# Patient Record
Sex: Female | Born: 1935 | Race: White | Hispanic: No | Smoking: Former smoker
Health system: Southern US, Community
[De-identification: ages and names within clinical notes are randomized; demographics above are authoritative.]

## PROBLEM LIST (undated history)

## (undated) DIAGNOSIS — I10 Essential (primary) hypertension: Secondary | ICD-10-CM

## (undated) DIAGNOSIS — I350 Nonrheumatic aortic (valve) stenosis: Secondary | ICD-10-CM

## (undated) DIAGNOSIS — K219 Gastro-esophageal reflux disease without esophagitis: Secondary | ICD-10-CM

## (undated) DIAGNOSIS — I4821 Permanent atrial fibrillation: Secondary | ICD-10-CM

## (undated) DIAGNOSIS — Z85828 Personal history of other malignant neoplasm of skin: Secondary | ICD-10-CM

## (undated) DIAGNOSIS — K279 Peptic ulcer, site unspecified, unspecified as acute or chronic, without hemorrhage or perforation: Secondary | ICD-10-CM

## (undated) DIAGNOSIS — Z803 Family history of malignant neoplasm of breast: Secondary | ICD-10-CM

## (undated) DIAGNOSIS — E785 Hyperlipidemia, unspecified: Secondary | ICD-10-CM

## (undated) DIAGNOSIS — I340 Nonrheumatic mitral (valve) insufficiency: Secondary | ICD-10-CM

## (undated) DIAGNOSIS — K760 Fatty (change of) liver, not elsewhere classified: Secondary | ICD-10-CM

## (undated) DIAGNOSIS — Z9889 Other specified postprocedural states: Secondary | ICD-10-CM

## (undated) DIAGNOSIS — E042 Nontoxic multinodular goiter: Secondary | ICD-10-CM

## (undated) DIAGNOSIS — D72820 Lymphocytosis (symptomatic): Secondary | ICD-10-CM

## (undated) DIAGNOSIS — M1990 Unspecified osteoarthritis, unspecified site: Secondary | ICD-10-CM

## (undated) DIAGNOSIS — R112 Nausea with vomiting, unspecified: Secondary | ICD-10-CM

## (undated) DIAGNOSIS — N390 Urinary tract infection, site not specified: Secondary | ICD-10-CM

## (undated) DIAGNOSIS — I272 Pulmonary hypertension, unspecified: Secondary | ICD-10-CM

## (undated) DIAGNOSIS — Z8042 Family history of malignant neoplasm of prostate: Secondary | ICD-10-CM

## (undated) DIAGNOSIS — I351 Nonrheumatic aortic (valve) insufficiency: Secondary | ICD-10-CM

## (undated) DIAGNOSIS — R918 Other nonspecific abnormal finding of lung field: Secondary | ICD-10-CM

## (undated) DIAGNOSIS — I499 Cardiac arrhythmia, unspecified: Secondary | ICD-10-CM

## (undated) DIAGNOSIS — I050 Rheumatic mitral stenosis: Secondary | ICD-10-CM

## (undated) DIAGNOSIS — R600 Localized edema: Secondary | ICD-10-CM

## (undated) HISTORY — DX: Localized edema: R60.0

## (undated) HISTORY — DX: Hyperlipidemia, unspecified: E78.5

## (undated) HISTORY — DX: Permanent atrial fibrillation: I48.21

## (undated) HISTORY — DX: Gastro-esophageal reflux disease without esophagitis: K21.9

## (undated) HISTORY — DX: Cardiac arrhythmia, unspecified: I49.9

## (undated) HISTORY — DX: Unspecified osteoarthritis, unspecified site: M19.90

## (undated) HISTORY — DX: Pulmonary hypertension, unspecified: I27.20

## (undated) HISTORY — DX: Peptic ulcer, site unspecified, unspecified as acute or chronic, without hemorrhage or perforation: K27.9

## (undated) HISTORY — DX: Nonrheumatic mitral (valve) insufficiency: I34.0

## (undated) HISTORY — DX: Other nonspecific abnormal finding of lung field: R91.8

## (undated) HISTORY — DX: Fatty (change of) liver, not elsewhere classified: K76.0

## (undated) HISTORY — DX: Family history of malignant neoplasm of prostate: Z80.42

## (undated) HISTORY — DX: Lymphocytosis (symptomatic): D72.820

## (undated) HISTORY — DX: Rheumatic mitral stenosis: I05.0

## (undated) HISTORY — DX: Personal history of other malignant neoplasm of skin: Z85.828

## (undated) HISTORY — DX: Nonrheumatic aortic (valve) stenosis: I35.0

## (undated) HISTORY — DX: Essential (primary) hypertension: I10

## (undated) HISTORY — DX: Nontoxic multinodular goiter: E04.2

## (undated) HISTORY — DX: Nonrheumatic aortic (valve) insufficiency: I35.1

## (undated) HISTORY — DX: Family history of malignant neoplasm of breast: Z80.3

## (undated) HISTORY — PX: CYSTOSCOPY: SUR368

## (undated) HISTORY — PX: OTHER SURGICAL HISTORY: SHX169

---

## 1960-08-04 HISTORY — PX: BREAST SURGERY: SHX581

## 1969-08-04 HISTORY — PX: BREAST SURGERY: SHX581

## 1976-08-04 HISTORY — PX: OTHER SURGICAL HISTORY: SHX169

## 1977-08-04 HISTORY — PX: DILATION AND CURETTAGE OF UTERUS: SHX78

## 2000-08-04 HISTORY — PX: JOINT REPLACEMENT: SHX530

## 2001-08-04 HISTORY — PX: CHOLECYSTECTOMY: SHX55

## 2004-03-04 DIAGNOSIS — I83893 Varicose veins of bilateral lower extremities with other complications: Secondary | ICD-10-CM | POA: Insufficient documentation

## 2005-08-04 HISTORY — PX: JOINT REPLACEMENT: SHX530

## 2008-05-24 ENCOUNTER — Encounter: Admission: RE | Admit: 2008-05-24 | Discharge: 2008-05-24 | Payer: Self-pay | Admitting: Family Medicine

## 2008-10-06 ENCOUNTER — Ambulatory Visit (HOSPITAL_COMMUNITY): Admission: RE | Admit: 2008-10-06 | Discharge: 2008-10-06 | Payer: Self-pay | Admitting: Cardiology

## 2008-10-11 ENCOUNTER — Encounter: Admission: RE | Admit: 2008-10-11 | Discharge: 2008-10-11 | Payer: Self-pay | Admitting: Cardiology

## 2008-10-16 ENCOUNTER — Ambulatory Visit: Payer: Self-pay | Admitting: Internal Medicine

## 2008-10-18 ENCOUNTER — Encounter: Admission: RE | Admit: 2008-10-18 | Discharge: 2008-10-18 | Payer: Self-pay | Admitting: Family Medicine

## 2008-10-23 ENCOUNTER — Inpatient Hospital Stay (HOSPITAL_COMMUNITY): Admission: AD | Admit: 2008-10-23 | Discharge: 2008-10-25 | Payer: Self-pay | Admitting: Cardiology

## 2008-11-07 ENCOUNTER — Other Ambulatory Visit: Admission: RE | Admit: 2008-11-07 | Discharge: 2008-11-07 | Payer: Self-pay | Admitting: Internal Medicine

## 2008-11-07 ENCOUNTER — Encounter: Payer: Self-pay | Admitting: Internal Medicine

## 2008-11-07 LAB — CBC WITH DIFFERENTIAL/PLATELET
BASO%: 0.6 % (ref 0.0–2.0)
Basophils Absolute: 0.1 10*3/uL (ref 0.0–0.1)
EOS%: 3.7 % (ref 0.0–7.0)
Eosinophils Absolute: 0.4 10*3/uL (ref 0.0–0.5)
HCT: 38.5 % (ref 34.8–46.6)
HGB: 13.5 g/dL (ref 11.6–15.9)
LYMPH%: 35.7 % (ref 14.0–49.7)
MCH: 31.6 pg (ref 25.1–34.0)
MCHC: 35.2 g/dL (ref 31.5–36.0)
MCV: 89.8 fL (ref 79.5–101.0)
MONO#: 0.7 10*3/uL (ref 0.1–0.9)
MONO%: 6.4 % (ref 0.0–14.0)
NEUT#: 6.1 10*3/uL (ref 1.5–6.5)
NEUT%: 53.6 % (ref 38.4–76.8)
Platelets: 236 10*3/uL (ref 145–400)
RBC: 4.29 10*6/uL (ref 3.70–5.45)
RDW: 13.2 % (ref 11.2–14.5)
WBC: 11.4 10*3/uL — ABNORMAL HIGH (ref 3.9–10.3)
lymph#: 4.1 10*3/uL — ABNORMAL HIGH (ref 0.9–3.3)

## 2008-11-07 LAB — LACTATE DEHYDROGENASE: LDH: 180 U/L (ref 94–250)

## 2008-11-07 LAB — COMPREHENSIVE METABOLIC PANEL
ALT: 30 U/L (ref 0–35)
AST: 25 U/L (ref 0–37)
Albumin: 4.4 g/dL (ref 3.5–5.2)
Alkaline Phosphatase: 34 U/L — ABNORMAL LOW (ref 39–117)
BUN: 25 mg/dL — ABNORMAL HIGH (ref 6–23)
CO2: 27 mEq/L (ref 19–32)
Calcium: 10 mg/dL (ref 8.4–10.5)
Chloride: 103 mEq/L (ref 96–112)
Creatinine, Ser: 1.21 mg/dL — ABNORMAL HIGH (ref 0.40–1.20)
Glucose, Bld: 99 mg/dL (ref 70–99)
Potassium: 4 mEq/L (ref 3.5–5.3)
Sodium: 141 mEq/L (ref 135–145)
Total Bilirubin: 0.7 mg/dL (ref 0.3–1.2)
Total Protein: 7.1 g/dL (ref 6.0–8.3)

## 2008-11-15 LAB — FLOW CYTOMETRY

## 2008-12-05 ENCOUNTER — Ambulatory Visit (HOSPITAL_COMMUNITY): Admission: RE | Admit: 2008-12-05 | Discharge: 2008-12-05 | Payer: Self-pay | Admitting: Family Medicine

## 2008-12-05 ENCOUNTER — Encounter (INDEPENDENT_AMBULATORY_CARE_PROVIDER_SITE_OTHER): Payer: Self-pay | Admitting: Interventional Radiology

## 2009-01-03 ENCOUNTER — Ambulatory Visit (HOSPITAL_COMMUNITY): Admission: RE | Admit: 2009-01-03 | Discharge: 2009-01-03 | Payer: Self-pay | Admitting: Family Medicine

## 2009-01-29 ENCOUNTER — Ambulatory Visit (HOSPITAL_COMMUNITY): Admission: RE | Admit: 2009-01-29 | Discharge: 2009-01-29 | Payer: Self-pay | Admitting: Cardiology

## 2009-02-20 ENCOUNTER — Ambulatory Visit: Payer: Self-pay | Admitting: Internal Medicine

## 2009-02-22 LAB — CBC WITH DIFFERENTIAL/PLATELET
BASO%: 0.7 % (ref 0.0–2.0)
Basophils Absolute: 0.1 10*3/uL (ref 0.0–0.1)
EOS%: 5.9 % (ref 0.0–7.0)
Eosinophils Absolute: 0.5 10*3/uL (ref 0.0–0.5)
HCT: 38.5 % (ref 34.8–46.6)
HGB: 13.5 g/dL (ref 11.6–15.9)
LYMPH%: 32.1 % (ref 14.0–49.7)
MCH: 31.9 pg (ref 25.1–34.0)
MCHC: 35 g/dL (ref 31.5–36.0)
MCV: 91.2 fL (ref 79.5–101.0)
MONO#: 0.6 10*3/uL (ref 0.1–0.9)
MONO%: 7.2 % (ref 0.0–14.0)
NEUT#: 4.9 10*3/uL (ref 1.5–6.5)
NEUT%: 54.1 % (ref 38.4–76.8)
Platelets: 244 10*3/uL (ref 145–400)
RBC: 4.22 10*6/uL (ref 3.70–5.45)
RDW: 12.9 % (ref 11.2–14.5)
WBC: 9.1 10*3/uL (ref 3.9–10.3)
lymph#: 2.9 10*3/uL (ref 0.9–3.3)

## 2009-02-22 LAB — LACTATE DEHYDROGENASE: LDH: 164 U/L (ref 94–250)

## 2009-03-01 ENCOUNTER — Emergency Department (HOSPITAL_COMMUNITY): Admission: EM | Admit: 2009-03-01 | Discharge: 2009-03-01 | Payer: Self-pay | Admitting: Emergency Medicine

## 2009-04-19 ENCOUNTER — Encounter: Admission: RE | Admit: 2009-04-19 | Discharge: 2009-04-19 | Payer: Self-pay | Admitting: Family Medicine

## 2009-05-08 ENCOUNTER — Encounter: Admission: RE | Admit: 2009-05-08 | Discharge: 2009-05-08 | Payer: Self-pay | Admitting: Surgery

## 2009-09-12 ENCOUNTER — Ambulatory Visit (HOSPITAL_COMMUNITY)
Admission: RE | Admit: 2009-09-12 | Discharge: 2009-09-12 | Payer: Self-pay | Source: Home / Self Care | Admitting: Cardiology

## 2009-09-23 ENCOUNTER — Emergency Department (HOSPITAL_COMMUNITY): Admission: EM | Admit: 2009-09-23 | Discharge: 2009-09-23 | Payer: Self-pay | Admitting: Emergency Medicine

## 2009-10-10 ENCOUNTER — Emergency Department (HOSPITAL_COMMUNITY): Admission: EM | Admit: 2009-10-10 | Discharge: 2009-10-10 | Payer: Self-pay | Admitting: Family Medicine

## 2009-10-10 ENCOUNTER — Emergency Department (HOSPITAL_COMMUNITY): Admission: EM | Admit: 2009-10-10 | Discharge: 2009-10-10 | Payer: Self-pay | Admitting: Emergency Medicine

## 2009-10-18 ENCOUNTER — Encounter: Admission: RE | Admit: 2009-10-18 | Discharge: 2009-10-18 | Payer: Self-pay | Admitting: Family Medicine

## 2010-02-28 ENCOUNTER — Ambulatory Visit (HOSPITAL_COMMUNITY): Admission: RE | Admit: 2010-02-28 | Discharge: 2010-02-28 | Payer: Self-pay | Admitting: Family Medicine

## 2010-04-30 ENCOUNTER — Encounter: Admission: RE | Admit: 2010-04-30 | Discharge: 2010-04-30 | Payer: Self-pay | Admitting: Family Medicine

## 2010-06-13 ENCOUNTER — Encounter: Admission: RE | Admit: 2010-06-13 | Discharge: 2010-06-13 | Payer: Self-pay | Admitting: Neurology

## 2010-06-24 ENCOUNTER — Inpatient Hospital Stay (HOSPITAL_COMMUNITY): Admission: EM | Admit: 2010-06-24 | Discharge: 2010-06-26 | Payer: Self-pay | Admitting: Emergency Medicine

## 2010-06-25 ENCOUNTER — Encounter (INDEPENDENT_AMBULATORY_CARE_PROVIDER_SITE_OTHER): Payer: Self-pay | Admitting: Internal Medicine

## 2010-08-12 ENCOUNTER — Encounter
Admission: RE | Admit: 2010-08-12 | Discharge: 2010-08-12 | Payer: Self-pay | Source: Home / Self Care | Attending: Surgery | Admitting: Surgery

## 2010-10-15 LAB — CBC
HCT: 33.7 % — ABNORMAL LOW (ref 36.0–46.0)
HCT: 34.2 % — ABNORMAL LOW (ref 36.0–46.0)
HCT: 34.4 % — ABNORMAL LOW (ref 36.0–46.0)
HCT: 36.3 % (ref 36.0–46.0)
Hemoglobin: 11.8 g/dL — ABNORMAL LOW (ref 12.0–15.0)
Hemoglobin: 11.9 g/dL — ABNORMAL LOW (ref 12.0–15.0)
Hemoglobin: 12 g/dL (ref 12.0–15.0)
Hemoglobin: 12.7 g/dL (ref 12.0–15.0)
MCH: 32.7 pg (ref 26.0–34.0)
MCH: 32.8 pg (ref 26.0–34.0)
MCH: 32.8 pg (ref 26.0–34.0)
MCH: 33.1 pg (ref 26.0–34.0)
MCHC: 34.6 g/dL (ref 30.0–36.0)
MCHC: 34.9 g/dL (ref 30.0–36.0)
MCHC: 35 g/dL (ref 30.0–36.0)
MCHC: 35.2 g/dL (ref 30.0–36.0)
MCV: 93.6 fL (ref 78.0–100.0)
MCV: 93.8 fL (ref 78.0–100.0)
MCV: 94.1 fL (ref 78.0–100.0)
MCV: 94.9 fL (ref 78.0–100.0)
Platelets: 191 10*3/uL (ref 150–400)
Platelets: 197 10*3/uL (ref 150–400)
Platelets: 201 10*3/uL (ref 150–400)
Platelets: 222 10*3/uL (ref 150–400)
RBC: 3.6 MIL/uL — ABNORMAL LOW (ref 3.87–5.11)
RBC: 3.63 MIL/uL — ABNORMAL LOW (ref 3.87–5.11)
RBC: 3.64 MIL/uL — ABNORMAL LOW (ref 3.87–5.11)
RBC: 3.88 MIL/uL (ref 3.87–5.11)
RDW: 12.9 % (ref 11.5–15.5)
RDW: 13 % (ref 11.5–15.5)
RDW: 13.1 % (ref 11.5–15.5)
RDW: 13.4 % (ref 11.5–15.5)
WBC: 10.4 10*3/uL (ref 4.0–10.5)
WBC: 11.1 10*3/uL — ABNORMAL HIGH (ref 4.0–10.5)
WBC: 11.5 10*3/uL — ABNORMAL HIGH (ref 4.0–10.5)
WBC: 12.2 10*3/uL — ABNORMAL HIGH (ref 4.0–10.5)

## 2010-10-15 LAB — COMPREHENSIVE METABOLIC PANEL
ALT: 52 U/L — ABNORMAL HIGH (ref 0–35)
ALT: 54 U/L — ABNORMAL HIGH (ref 0–35)
ALT: 58 U/L — ABNORMAL HIGH (ref 0–35)
AST: 44 U/L — ABNORMAL HIGH (ref 0–37)
AST: 46 U/L — ABNORMAL HIGH (ref 0–37)
AST: 50 U/L — ABNORMAL HIGH (ref 0–37)
Albumin: 3 g/dL — ABNORMAL LOW (ref 3.5–5.2)
Albumin: 3.2 g/dL — ABNORMAL LOW (ref 3.5–5.2)
Albumin: 3.2 g/dL — ABNORMAL LOW (ref 3.5–5.2)
Alkaline Phosphatase: 30 U/L — ABNORMAL LOW (ref 39–117)
Alkaline Phosphatase: 31 U/L — ABNORMAL LOW (ref 39–117)
Alkaline Phosphatase: 32 U/L — ABNORMAL LOW (ref 39–117)
BUN: 20 mg/dL (ref 6–23)
BUN: 24 mg/dL — ABNORMAL HIGH (ref 6–23)
BUN: 24 mg/dL — ABNORMAL HIGH (ref 6–23)
CO2: 29 mEq/L (ref 19–32)
CO2: 30 mEq/L (ref 19–32)
CO2: 32 mEq/L (ref 19–32)
Calcium: 9.5 mg/dL (ref 8.4–10.5)
Calcium: 9.6 mg/dL (ref 8.4–10.5)
Calcium: 9.8 mg/dL (ref 8.4–10.5)
Chloride: 103 mEq/L (ref 96–112)
Chloride: 104 mEq/L (ref 96–112)
Chloride: 106 mEq/L (ref 96–112)
Creatinine, Ser: 1.15 mg/dL (ref 0.4–1.2)
Creatinine, Ser: 1.22 mg/dL — ABNORMAL HIGH (ref 0.4–1.2)
Creatinine, Ser: 1.23 mg/dL — ABNORMAL HIGH (ref 0.4–1.2)
GFR calc Af Amer: 52 mL/min — ABNORMAL LOW (ref >60)
GFR calc Af Amer: 52 mL/min — ABNORMAL LOW (ref >60)
GFR calc Af Amer: 56 mL/min — ABNORMAL LOW (ref >60)
GFR calc non Af Amer: 43 mL/min — ABNORMAL LOW (ref >60)
GFR calc non Af Amer: 43 mL/min — ABNORMAL LOW (ref >60)
GFR calc non Af Amer: 46 mL/min — ABNORMAL LOW (ref >60)
Glucose, Bld: 110 mg/dL — ABNORMAL HIGH (ref 70–99)
Glucose, Bld: 116 mg/dL — ABNORMAL HIGH (ref 70–99)
Glucose, Bld: 122 mg/dL — ABNORMAL HIGH (ref 70–99)
Potassium: 3.5 mEq/L (ref 3.5–5.1)
Potassium: 3.7 mEq/L (ref 3.5–5.1)
Potassium: 4.4 mEq/L (ref 3.5–5.1)
Sodium: 139 mEq/L (ref 135–145)
Sodium: 140 mEq/L (ref 135–145)
Sodium: 144 mEq/L (ref 135–145)
Total Bilirubin: 0.6 mg/dL (ref 0.3–1.2)
Total Bilirubin: 0.8 mg/dL (ref 0.3–1.2)
Total Bilirubin: 0.8 mg/dL (ref 0.3–1.2)
Total Protein: 5.5 g/dL — ABNORMAL LOW (ref 6.0–8.3)
Total Protein: 5.7 g/dL — ABNORMAL LOW (ref 6.0–8.3)
Total Protein: 5.8 g/dL — ABNORMAL LOW (ref 6.0–8.3)

## 2010-10-15 LAB — BASIC METABOLIC PANEL
BUN: 20 mg/dL (ref 6–23)
BUN: 27 mg/dL — ABNORMAL HIGH (ref 6–23)
CO2: 30 mEq/L (ref 19–32)
CO2: 31 mEq/L (ref 19–32)
Calcium: 10 mg/dL (ref 8.4–10.5)
Calcium: 9.6 mg/dL (ref 8.4–10.5)
Chloride: 105 mEq/L (ref 96–112)
Chloride: 106 mEq/L (ref 96–112)
Creatinine, Ser: 1.15 mg/dL (ref 0.4–1.2)
Creatinine, Ser: 1.38 mg/dL — ABNORMAL HIGH (ref 0.4–1.2)
GFR calc Af Amer: 45 mL/min — ABNORMAL LOW (ref >60)
GFR calc Af Amer: 56 mL/min — ABNORMAL LOW (ref >60)
GFR calc non Af Amer: 37 mL/min — ABNORMAL LOW (ref >60)
GFR calc non Af Amer: 46 mL/min — ABNORMAL LOW (ref >60)
Glucose, Bld: 112 mg/dL — ABNORMAL HIGH (ref 70–99)
Glucose, Bld: 117 mg/dL — ABNORMAL HIGH (ref 70–99)
Potassium: 3.5 mEq/L (ref 3.5–5.1)
Potassium: 4.6 mEq/L (ref 3.5–5.1)
Sodium: 142 mEq/L (ref 135–145)
Sodium: 143 mEq/L (ref 135–145)

## 2010-10-15 LAB — URINALYSIS, ROUTINE W REFLEX MICROSCOPIC
Bilirubin Urine: NEGATIVE
Glucose, UA: NEGATIVE mg/dL
Hgb urine dipstick: NEGATIVE
Ketones, ur: NEGATIVE mg/dL
Nitrite: NEGATIVE
Protein, ur: NEGATIVE mg/dL
Specific Gravity, Urine: 1.019 (ref 1.005–1.030)
Urobilinogen, UA: 0.2 mg/dL (ref 0.0–1.0)
pH: 7 (ref 5.0–8.0)

## 2010-10-15 LAB — CARDIAC PANEL(CRET KIN+CKTOT+MB+TROPI)
CK, MB: 4.9 ng/mL — ABNORMAL HIGH (ref 0.3–4.0)
Relative Index: 1.5 (ref 0.0–2.5)
Total CK: 318 U/L — ABNORMAL HIGH (ref 7–177)
Troponin I: 0.02 ng/mL (ref 0.00–0.06)

## 2010-10-15 LAB — HEPATITIS PANEL, ACUTE
HCV Ab: NEGATIVE
Hep A IgM: NEGATIVE
Hep B C IgM: NEGATIVE
Hepatitis B Surface Ag: NEGATIVE

## 2010-10-15 LAB — PROTIME-INR
INR: 1.53 — ABNORMAL HIGH (ref 0.00–1.49)
INR: 1.73 — ABNORMAL HIGH (ref 0.00–1.49)
INR: 1.75 — ABNORMAL HIGH (ref 0.00–1.49)
INR: 1.86 — ABNORMAL HIGH (ref 0.00–1.49)
Prothrombin Time: 18.6 seconds — ABNORMAL HIGH (ref 11.6–15.2)
Prothrombin Time: 20.4 seconds — ABNORMAL HIGH (ref 11.6–15.2)
Prothrombin Time: 20.6 seconds — ABNORMAL HIGH (ref 11.6–15.2)
Prothrombin Time: 21.6 seconds — ABNORMAL HIGH (ref 11.6–15.2)

## 2010-10-15 LAB — APTT: aPTT: 30 seconds (ref 24–37)

## 2010-10-15 LAB — TSH: TSH: 1.211 u[IU]/mL (ref 0.350–4.500)

## 2010-10-23 LAB — POCT I-STAT, CHEM 8
BUN: 21 mg/dL (ref 6–23)
Calcium, Ion: 1.24 mmol/L (ref 1.12–1.32)
Chloride: 105 mEq/L (ref 96–112)
Creatinine, Ser: 1 mg/dL (ref 0.4–1.2)
Glucose, Bld: 118 mg/dL — ABNORMAL HIGH (ref 70–99)
HCT: 37 % (ref 36.0–46.0)
Hemoglobin: 12.6 g/dL (ref 12.0–15.0)
Potassium: 3.7 mEq/L (ref 3.5–5.1)
Sodium: 141 mEq/L (ref 135–145)
TCO2: 32 mmol/L (ref 0–100)

## 2010-10-23 LAB — PROTIME-INR
INR: 3.38 — ABNORMAL HIGH (ref 0.00–1.49)
Prothrombin Time: 33.9 seconds — ABNORMAL HIGH (ref 11.6–15.2)

## 2010-10-28 LAB — PROTIME-INR
INR: 2.23 — ABNORMAL HIGH (ref 0.00–1.49)
Prothrombin Time: 24.5 seconds — ABNORMAL HIGH (ref 11.6–15.2)

## 2010-11-11 LAB — PROTIME-INR
INR: 2.6 — ABNORMAL HIGH (ref 0.00–1.49)
Prothrombin Time: 29.5 seconds — ABNORMAL HIGH (ref 11.6–15.2)

## 2010-11-11 LAB — APTT: aPTT: 31 seconds (ref 24–37)

## 2010-11-11 LAB — MAGNESIUM: Magnesium: 2.1 mg/dL (ref 1.5–2.5)

## 2010-11-12 LAB — PROTIME-INR
INR: 1.1 (ref 0.00–1.49)
Prothrombin Time: 14.8 seconds (ref 11.6–15.2)

## 2010-11-12 LAB — APTT: aPTT: 24 seconds (ref 24–37)

## 2010-11-14 LAB — COMPREHENSIVE METABOLIC PANEL
ALT: 28 U/L (ref 0–35)
AST: 29 U/L (ref 0–37)
Albumin: 3.9 g/dL (ref 3.5–5.2)
Alkaline Phosphatase: 36 U/L — ABNORMAL LOW (ref 39–117)
BUN: 31 mg/dL — ABNORMAL HIGH (ref 6–23)
CO2: 29 mEq/L (ref 19–32)
Calcium: 10 mg/dL (ref 8.4–10.5)
Chloride: 102 mEq/L (ref 96–112)
Creatinine, Ser: 1.18 mg/dL (ref 0.4–1.2)
GFR calc Af Amer: 54 mL/min — ABNORMAL LOW (ref >60)
GFR calc non Af Amer: 45 mL/min — ABNORMAL LOW (ref >60)
Glucose, Bld: 109 mg/dL — ABNORMAL HIGH (ref 70–99)
Potassium: 4.1 mEq/L (ref 3.5–5.1)
Sodium: 140 mEq/L (ref 135–145)
Total Bilirubin: 0.7 mg/dL (ref 0.3–1.2)
Total Protein: 6.7 g/dL (ref 6.0–8.3)

## 2010-11-14 LAB — CBC
HCT: 42 % (ref 36.0–46.0)
HCT: 41.2 % (ref 36.0–46.0)
Hemoglobin: 14.8 g/dL (ref 12.0–15.0)
Hemoglobin: 14.5 g/dL (ref 12.0–15.0)
MCHC: 35.2 g/dL (ref 30.0–36.0)
MCHC: 35.1 g/dL (ref 30.0–36.0)
MCV: 90.8 fL (ref 78.0–100.0)
MCV: 91.1 fL (ref 78.0–100.0)
Platelets: 308 10*3/uL (ref 150–400)
Platelets: 256 10*3/uL (ref 150–400)
RBC: 4.62 MIL/uL (ref 3.87–5.11)
RBC: 4.53 MIL/uL (ref 3.87–5.11)
RDW: 12.8 % (ref 11.5–15.5)
RDW: 12.7 % (ref 11.5–15.5)
WBC: 13 10*3/uL — ABNORMAL HIGH (ref 4.0–10.5)
WBC: 12.6 10*3/uL — ABNORMAL HIGH (ref 4.0–10.5)

## 2010-11-14 LAB — PROTIME-INR
INR: 2.7 — ABNORMAL HIGH (ref 0.00–1.49)
INR: 2.7 — ABNORMAL HIGH (ref 0.00–1.49)
INR: 3.2 — ABNORMAL HIGH (ref 0.00–1.49)
INR: 2.8 — ABNORMAL HIGH (ref 0.00–1.49)
Prothrombin Time: 30.3 seconds — ABNORMAL HIGH (ref 11.6–15.2)
Prothrombin Time: 30.8 seconds — ABNORMAL HIGH (ref 11.6–15.2)
Prothrombin Time: 35.7 seconds — ABNORMAL HIGH (ref 11.6–15.2)
Prothrombin Time: 31.1 seconds — ABNORMAL HIGH (ref 11.6–15.2)

## 2010-11-14 LAB — APTT: aPTT: 35 seconds (ref 24–37)

## 2010-11-14 LAB — TSH: TSH: 0.523 u[IU]/mL (ref 0.350–4.500)

## 2010-11-14 LAB — MAGNESIUM: Magnesium: 2.3 mg/dL (ref 1.5–2.5)

## 2010-12-17 NOTE — H&P (Signed)
NAMEJONISE, BEITLER              ACCOUNT NO.:  [id number]   MEDICAL RECORD NO.:  [id number]          PATIENT TYPE:  INP   LOCATION:  2040                         FACILITY:  [facility]   PHYSICIAN:  Fransico Him, M.D.     DATE OF BIRTH:  15-Dec-1932   DATE OF ADMISSION:  10/23/2008  DATE OF DISCHARGE:                              HISTORY & PHYSICAL   CHIEF COMPLAINT:  Atrial fibrillation.   Ms. Schied is a 77 year old female who has known atrial fibrillation.  She was recently cardioverted in the hospital and was seen back in the  office on October 19, 2008, with no complaints.  Unfortunately, when she  was seen in the office she was back in atrial fibrillation.  We  recommended elective admission to the hospital for drug loading on  amiodarone.  The patient is being admitted today for amiodarone loading.   PAST MEDICAL HISTORY:  1. Hypercholesterolemia.  2. Osteoarthritis.  3. History of peptic ulcer disease.  4. Esophageal reflux.  5. Hypertension.  6. Osteopenia.  7. Thyroid nodules by CT in March 2010.  8. Nodules on chest x-ray and chest CT probably chronic granulomatous      changes with followup CT recommended in 6-12 months that was      performed in March 2010.  9. Fatty liver mass by CT in March 2010.   PAST SURGICAL HISTORY:  1. Right breast surgery for benign lesion in 1962.  2. Breast surgery on both breasts for benign lesions.  3. Cystoscopy in 1974.  4. Aspiration of cyst in 1978.  5. D and C in 1979.  6. Cholecystectomy in 2003.  7. Left knee replacement in 2007.   FAMILY HISTORY:  Dad deceased at age 65 of stroke.  Mom deceased at age  86 years of a stroke.  One brother deceased at 2 years of cardiac  issues.  Another brother deceased at age 26 of prostate cancer.  Another  brother deceased at age 32 of prostate cancer.  She has one sister who  is alive, who has had breast cancer as well as osteoporosis, another  sister with arthritis, and another sister  with scoliosis.   SOCIAL HISTORY:  No tobacco, alcohol, or illicit drug use.  She is  widowed.   MEDICATIONS ON ADMISSION:  1. Paxil 10 mg a day.  2. Celebrex 200 mg a day.  3. Zantac 300 mg nightly.  4. Multivitamin 1 capsule daily.  5. Vitamin D 1000 units 1 tablet daily.  6. Os-Cal 500 plus D 1 tablet twice a day.  7. Actonel 35 mg weekly.  8. Baby aspirin 81 mg a day.  9. Toprol-XL 25 mg a day.  10.Hyzaar 100/12.5 mg 1 tablet daily.  11.Pravachol 40 mg a day.  12.Omega-3 1000 mg as directed.  13.Coumadin 5 mg daily except 7.5 mg on Friday.   ALLERGIES:  SULFA DRUGS.   PHYSICAL EXAMINATION:  VITAL SIGNS:  Pulse 92, blood pressure 132/80,  weight 165.  HEENT:  Grossly normal.  NECK:  No carotid or subclavian bruits.  No JVD or thyromegaly.  Sclerae  clear.  Conjunctivae normal.  Nares without drainage.  CHEST:  Clear to auscultation bilaterally.  No wheezing or rhonchi.  HEART:  Regular rate and no gross murmur.  ABDOMEN:  Good bowel sounds, nontender, nondistended.  No mass.  No  bruits.  LOWER EXTREMITIES:  No peripheral edema.  SKIN:  Warm and dry.  Cranial nerves II through XII grossly intact.   ASSESSMENT AND PLAN:  1. Atrial fibrillation.  2. Long-term Coumadin use.  3. Hypertension.  4. Dyslipidemia.  5. Osteoarthritis.  6. Peptic ulcer disease.  7. Gastroesophageal reflux disease.  8. Hypertension.  9. Osteopenia.   The patient is admitted for amiodarone load.  We will check PFTs, labs,  and TSH.  The patient was seen and examined by Dr. Fransico Him.      Mallory Stephens, P.A.      Fransico Him, M.D.  Electronically Signed   LB/MEDQ  D:  10/23/2008  T:  10/24/2008  Job:  [id number]   cc:   Lilian Coma, MD

## 2010-12-17 NOTE — Discharge Summary (Signed)
Mallory Stephens, Mallory Stephens              ACCOUNT NO.:  [id number]   MEDICAL RECORD NO.:  [id number]          PATIENT TYPE:  INP   LOCATION:  2040                         FACILITY:  [facility]   PHYSICIAN:  Armanda Magic, M.D.     DATE OF BIRTH:  Dec 16, 1932   DATE OF ADMISSION:  10/23/2008  DATE OF DISCHARGE:  10/25/2008                               DISCHARGE SUMMARY   DISCHARGE DIAGNOSES:  1. Atrial fibrillation, rate controlled.  2. Amiodarone load.  3. Long-term Coumadin therapy.  4. Hypertension.  5. Hyperlipidemia.  6. Osteoarthritis.  7. History of peptic ulcer disease.  8. Esophageal reflux.  9. Osteopenia.   HOSPITAL COURSE:  [name] is a 77 year old female who was admitted  to the hospital on October 23, 2008 for amiodarone loading.  She had been  in atrial fibrillation and we are making attempts to chemically  cardiovert her back into normal sinus rhythm.  She recently had been  cardioverted but reverted back into atrial fibrillation.   She tolerated the amiodarone load without difficulty.  Her PFTs showed  mild diffusion defect - pulmonary vascular.  The FVC, FEV-1, FEV-1/FVC  ratio and EF 25-35% are within normal limits.  Following the  administration of bronchodilators, there was no significant response.   DISCHARGE LABORATORY DATA:  PT of 25.7 and INR 3.2.  TSH 0.523.  Magnesium was 2.3.  Sodium 140, potassium 4.1, BUN 31, and creatinine  1.18.  Hemoglobin 14.8 and hematocrit 42.0.  Her chest x-ray showed  cardiomegaly without pulmonary edema.   DISCHARGE MEDICATIONS:  1. Coumadin 5 mg a day except 2.5 mg on Wednesday and Friday.  2. Paxil 10 mg a day.  3. Celebrex 200 mg a day.  4. Zantac 300 mg a day.  5. Multivitamin daily.  6. Vitamin D daily.  7. Os-Cal daily.  8. Actonel weekly.  9. Baby aspirin 81 mg a day.  10.Hyzaar 100/12.5 mg a day.  11.Omega-3 1000 mg as directed.  12.Amiodarone 200 mg tablets 2 p.o. b.i.d. for 1 week, then 2 p.o.      daily x1 week,  and then 1 p.o. daily thereafter.   She is to stop taking her Toprol.   CONDITION ON DISCHARGE:  She is discharged to home in stable but  improved condition.   DIET:  She is to remain on a low-sodium heart-healthy diet.   ACTIVITY:  Increase activity slowly.   FOLLOWUP:  Follow up with Riki Rusk in our [facility]in [facility] as well as an  EKG on Monday, October 30, 2008 at 10:45 a.m., then follow up with Dr.  Mayford Knife on November 08, 2008 at 10:30 a.m.      Guy Franco, P.A.      Armanda Magic, M.D.  Electronically Signed    LB/MEDQ  D:  10/25/2008  T:  10/25/2008  Job:  [id number]   cc:   Emeterio Reeve, MD

## 2010-12-17 NOTE — Op Note (Signed)
Mallory Stephens, Mallory Stephens              ACCOUNT NO.:  [id number]   MEDICAL RECORD NO.:  [id number]          PATIENT TYPE:  OIB   LOCATION:  2899                         FACILITY:  [facility]   PHYSICIAN:  Armanda Magic, M.D.     DATE OF BIRTH:  05-21-1933   DATE OF PROCEDURE:  01/29/2009  DATE OF DISCHARGE:  01/29/2009                               OPERATIVE REPORT   REFERRING PHYSICIAN:  Emeterio Reeve, MD   PROCEDURE:  Direct current cardioversion.   OPERATOR:  Armanda Magic, MD   INDICATIONS:  Atrial fibrillation.   COMPLICATIONS:  None.   INTRAVENOUS MEDICATIONS:  Pentothal 100 mg IV.   This is a 77 year old female who has a history of atrial fibrillation in  the past with recurrent atrial fibrillation after cardioversion and is  now status post amiodarone drug loading.  She has had an INR greater  than 2 for 4 weeks and now presents for cardioversion.   [facility] fasting nonsedated  state.  Informed consent was obtained.  The patient was connected to  continuous heart rate and pulse oximetry monitoring, and intermittent  blood pressure monitoring.  Defibrillator pads were placed on the left  anterior chest and back.  After adequate anesthesia was obtained, a  synchronized biphasic 150-joule shock was delivered successfully  converting the patient to sinus rhythm.  The patient tolerated the  procedure well without complications.   ASSESSMENT:  1. Atrial fibrillation, status post amiodarone load.  2. Successful cardioversion to sinus rhythm.   PLAN:  Discharge home after fully awake and ambulating.  She will follow  up with my nurse practitioner in 2 weeks for EKG check.      Armanda Magic, M.D.  Electronically Signed     TT/MEDQ  D:  01/29/2009  T:  01/29/2009  Job:  [id number]

## 2010-12-17 NOTE — Op Note (Signed)
NAMECANDE, Mallory Stephens              ACCOUNT NO.:  [id number]   MEDICAL RECORD NO.:  [id number]          PATIENT TYPE:  OIB   LOCATION:  2899                         FACILITY:  [facility]   PHYSICIAN:  Armanda Magic, M.D.     DATE OF BIRTH:  11/07/1932   DATE OF PROCEDURE:  10/06/2008  DATE OF DISCHARGE:  10/06/2008                               OPERATIVE REPORT   REFERRING PHYSICIAN:  Jasmine December A. Paulino Rily, MD   PROCEDURE:  Direct current cardioversion.   OPERATOR:  Armanda Magic, MD   INDICATIONS:  Atrial fibrillation.   COMPLICATIONS:  None.   MEDICATIONS:  Pentothal 75 mg IV.   This is a 77 year old female who presented with atrial fibrillation and  now presents for cardioversion after INR has been therapeutic for 4  weeks on Coumadin.  She has a history of hypertension as well.   The patient was brought to the [facility] on a fasting nonsedated  state.  Informed consent was obtained.  The patient was connected to  continuous heart rate and pulse oximetry monitoring and blood pressure  monitoring.  After adequate anesthesia was obtained and defibrillator  pads were placed in the left anterior and posterior chest, a 150 joules  biphasic synchronized shock was delivered which was unsuccessful at  converting the patient to sinus rhythm.  A synchronized biphasic 200  joules shock was delivered, which successfully converted the patient's  sinus rhythm.  The patient tolerated the procedure well without  complications.   ASSESSMENT:  1. Atrial fibrillation.  2. Systemic anticoagulation with therapeutic INR.  3. Successful cardioversion to sinus rhythm.   PLAN:  1. Discharge to home after fully awake and ambulating.  2. Follow up with me in 2 weeks.  3. Outpatient chest CT to evaluate pulmonary nodule noted on chest x-      ray.      Armanda Magic, M.D.  Electronically Signed     TT/MEDQ  D:  10/06/2008  T:  10/07/2008  Job:  [id number]   cc:   Emeterio Reeve, MD

## 2011-07-16 ENCOUNTER — Encounter (HOSPITAL_COMMUNITY): Payer: Self-pay

## 2011-07-17 ENCOUNTER — Encounter: Payer: Self-pay | Admitting: Cardiology

## 2011-07-17 ENCOUNTER — Other Ambulatory Visit: Payer: Self-pay | Admitting: Cardiology

## 2011-07-17 MED ORDER — HYDROCORTISONE 1 % EX CREA: 1.0000 "application " | TOPICAL_CREAM | Freq: Three times a day (TID) | CUTANEOUS | Status: DC | PRN

## 2011-07-17 MED ORDER — SODIUM CHLORIDE 0.9 % IV SOLN: 250.0000 mL | INTRAVENOUS | Status: DC | PRN

## 2011-07-17 MED ORDER — SODIUM CHLORIDE 0.9 % IJ SOLN: 3.0000 mL | INTRAMUSCULAR | Status: DC | PRN

## 2011-07-17 MED ORDER — SODIUM CHLORIDE 0.9 % IJ SOLN: 3.0000 mL | Freq: Two times a day (BID) | INTRAMUSCULAR | Status: DC

## 2011-07-17 NOTE — H&P (Signed)
Office Visit     Patient: Mallory Stephens, Mallory Stephens Provider: Fransico Him, MD  DOB: [birth year] Age: 77 Y Sex: Female Date: 07/16/2011  Phone: [phone]   Address: [place], [place]  Pcp: [name]       Subjective:     CC:    1. 6 month follow up and Ysidro Evert.        HPI:  General:  The patient presents today for followup of her PAF and HTN. She was diagnosed with a UTI on Monday and is on antibiotics. She denies any chest pain, SOB, DOE, dizziness or palpitations. She has had some LE edema recently but has been in therapy for an injured foot..        ROS:  See HPI, A twelve system review was perfomed at today's visit. For pertinent positives and negatives see HPI.       Medical History: Elevated Cholesterol, Osteoarthritis, hx of PUD, Esophageal reflux, Hypertension, Osteopenia (-1.2; -1.5) abnormal since 01/2007, paroxysmal atrial fibrillation s/p DCCV 03/10, s/p DCCV 01/2009, multinodular goiter by CT, 10/2008 - benign cytopath, Dr. Harlow Asa, Pulmonary nodules on CXR and Chest CT, prob chronic granulomatous changes, follow-up CT 12-18 months, fatty liver by CT, 10/2008, persistent mild leukocytosis with lymphocytosis (reactive), Dr. Julien Nordmann, [facility], 2D echo 2009 mild MR/TR and AR, chronic dizziness, CT head in 04/2010 was normal, carotids were negative.        Surgical History: right breast surgery for benign lesion 1962, breast surgery both breasts for benign lesions 1971, cystoscopy 1974, aspiration of cyst 1978, D&C 1979, aspiration assessed 1980, right knee replacement 2002, cholecystectomy 2003, left knee replacement 2007.        Hospitalization/Major Diagnostic Procedure: Amiodarone load 10/23/2008, cardio version times 2 2010.        Family History: Father: deceased 45 yrs stroke Mother: deceased 34 yrs stroke Brother 1: deceased 43 yrs cardiac issues Brother2: deceased 10 yrs prostate cancer Brother 3: deceased 71 yrs prostate cancer Sister 1: alive 57 yrs  breast cancer,, osteoporosis Sister 2: alive 5 yrs arthritis Sister 3: alive 55 yrs scoliosis        Social History:  General:  History of smoking cigarettes: Former smoker, Quit in year 1990's.  no Alcohol.  Caffeine: yes, coffee, 2 servings daily.  no Recreational drug use.  Diet: yes, low fat.  Exercise: yes, walks, gym, intermittent.  Occupation: unemployed, retired.  Marital Status: single, widowed.  Children: none.  Seat belt use: yes.        Medications: Multivitamins Capsule 1 capsule Once a day, Vitamin D3 1000 UNIT Tablet 1 tablet Once a day, Omega 3 1000 MG Capsule 2 capsule once a day, Zantac 300 MG Tablet 1 tablet once a day, Aspirin 81 MG Tablet Chewable 1 tablet Once a day, Pravastatin Sodium 20 MG Tablet 1 tablet Once a day, Calcium 500 MG Tablet 1 tablet with meals Twice a day, Hyzaar 100-12.5 MG Tablet 1 tablet Once a day, Norvasc 10 MG Tablet 1 tablet Once a day, Premarin 0.625 MG/GM Cream as directed twice a week, Celebrex 200 MG Capsule 1 capsule Once a day prn, Paxil 10 MG Tablet 1 tablet in the morning Once a day, Warfarin Sodium 5 MG Tablet per pharmD 5 mg qd except 7.5 mg F, Ciprofloxacin HCl 250 MG Tablet 1 tablet Twice a day, Medication List reviewed and reconciled with the patient       Allergies: Sulfa drugs (for allergy): rash: Allergy.  Objective:     Vitals: Wt 156, Ht 65.5, BMI 25.56, Pulse sitting 80, BP sitting 138/72.       Examination:  Cardiology, General:  GENERAL APPEARANCE: pleasant, NAD.  HEENT: unremarkable.  CAROTID UPSTROKE: normal, no bruit.  JVD: flat.  HEART SOUNDS: normal S1, S2, no S3 or S4, irregularly irregular.  MURMUR: absent.  LUNGS: no rales or wheezes.  ABDOMEN: soft, non tender, positive bowel sounds, no masses felt.  EXTREMITIES: bilateral trace pitting edema.  PERIPHERAL PULSES: 2 plus bilateral.        Assessment:     Assessment:  1. Atrial fibrillation - 427.31 (Primary)  2. Encounter for long-term  (current) use of med (excludes anticoagulant, NSAIDS, steroids, aspirin, insulin) - V58.69  3. Essential hypertension, benign - 401.1    Plan:     1. Atrial fibrillation Continue Warfarin Sodium Tablet, 5 MG, per pharmD, Orally, 5 mg qd except 7.5 mg F .  LAB: BASIC METABOLIC Abnormal    GLUCOSE 96 70-99 - MG/DL    BUN 23 6-26 - MG/DL    CREATININE 1.00 0.60-1.30 - MG/DL    eGFR (NON-AFRICAN AMERICAN) 53.91 >60.00 - ML/MIN L   eGFR (AFRICAN AMERICAN) 65.23 >60.00 - ML/MIN    SODIUM 142 136-145 - MMOL/L    POTASSIUM 3.8 3.5-5.5 - MMOL/L    CHLORIDE 103 98-107 - MMOL/L    C02 32 22-32 - MMOL/L    ANION GAP 10.9 6.0-20.0 - MMOL/L    CALCIUM 10.5 8.6-10.3 - MG/DL H    Mallory Stephens M 07/17/2011 08:57:54 AM > forward to primary MD for elevated calcium Mallory Stephens,Mallory Stephens 07/17/2011 09:14:03 AM > pt notified. forwarded to Dr Stephanie Acre    LAB: CBC WITH DIFF    WBC 11.7 4.0-11.0 - K/uL H   RBC 4.12 4.20-5.40 - M/uL L   HGB 13.4 12.0-16.0 - g/dL    HCT 38.7 37.0-47.0 - %    MCV 94.1 81.0-99.0 - fL    MCHC 34.5 32.0-36.0 - g/dL    RDW 12.2 11.5-15.5 - %    PLT 260 150-400 - K/uL    NEUT % 47.9 43.3-71.9 - %    LYMPH% 38.4 16.8-43.5 - %    MONO % 8.0 4.6-12.4 - %    EOS % 4.40 0.00-7.80 - %    BASO % 1.3 0.0-1.0 - % H   NEUT # 5.6 1.9-7.2 - K/uL    LYMPH# 4.50 1.10-2.73 - K/uL H   MONO # 0.9 0.3-0.8 - K/uL H   EOS # 0.50 0.00-0.60 - K/uL    BASO # 0.20 0.00-0.10 - K/uL H    Mallory Stephens M 07/17/2011 09:21:13 AM > mildly elevated WBC which has been elevated in the past -she is currently being treated for a UTI please forward to primary MD for further review Mallory Stephens,Mallory Stephens 07/17/2011 10:38:02 AM > forwarded to Dr. Stephanie Acre    Diagnostic Imaging:EKG afib , Mallory Stephens,Mallory Stephens 07/16/2011 10:27:04 AM > Mallory Stephens M 07/16/2011 10:35:07 AM >  She is back in atrial fibrillation today. Her INR has been therapeutic for several months. I will check her INR today and if therapuetic will set her up for  DCCV on 12/18.       2. Encounter for long-term (current) use of med (excludes anticoagulant, NSAIDS, steroids, aspirin, insulin)  The patient's PT/INR will be checked in clininc today and anticoagulation adjusted as needed and reviewed by me - see coumadin clinic note.       3. Essential hypertension, benign Continue Norvasc  Tablet, 10 MG, 1 tablet, Orally, Once a day ; Continue Hyzaar Tablet, 100-12.5 MG, 1 tablet, Orally, Once a day .        Immunizations:        Labs:        Procedure Codes: 93000 EKG I AND R, [id number] ECL BMP, 85025 ECL CBC PLATELET DIFF, [id number] BLOOD COLLECTION ROUTINE VENIPUNCTURE       Preventive:         Follow Up: cardioversion (Reason: afib/HTN)      Provider: Fransico Him, MD  Patient: Mallory Stephens, Mallory Stephens DOB: [birth year] Date: 07/16/2011

## 2011-07-21 MED ORDER — SODIUM CHLORIDE 0.9 % IV SOLN: INTRAVENOUS | Status: DC

## 2011-07-22 ENCOUNTER — Encounter (HOSPITAL_COMMUNITY)
Admission: RE | Disposition: A | Discharge status: Home / Self Care | Payer: Self-pay | Source: Ambulatory Visit | Attending: Cardiology

## 2011-07-22 ENCOUNTER — Other Ambulatory Visit: Payer: Self-pay

## 2011-07-22 ENCOUNTER — Encounter (HOSPITAL_COMMUNITY): Payer: Self-pay | Admitting: Anesthesiology

## 2011-07-22 ENCOUNTER — Other Ambulatory Visit: Payer: Self-pay | Admitting: Cardiology

## 2011-07-22 ENCOUNTER — Encounter: Payer: Self-pay | Admitting: Cardiology

## 2011-07-22 ENCOUNTER — Ambulatory Visit (HOSPITAL_COMMUNITY)
Admission: RE | Admit: 2011-07-22 | Discharge: 2011-07-22 | Disposition: A | Discharge status: Home / Self Care | Payer: Medicare Other | Source: Ambulatory Visit | Attending: Cardiology | Admitting: Cardiology

## 2011-07-22 DIAGNOSIS — R791 Abnormal coagulation profile: Secondary | ICD-10-CM | POA: Insufficient documentation

## 2011-07-22 DIAGNOSIS — Z5309 Procedure and treatment not carried out because of other contraindication: Secondary | ICD-10-CM | POA: Insufficient documentation

## 2011-07-22 DIAGNOSIS — Z01810 Encounter for preprocedural cardiovascular examination: Secondary | ICD-10-CM | POA: Insufficient documentation

## 2011-07-22 DIAGNOSIS — I4891 Unspecified atrial fibrillation: Secondary | ICD-10-CM | POA: Insufficient documentation

## 2011-07-22 HISTORY — PX: CARDIOVERSION: SHX1299

## 2011-07-22 LAB — PROTIME-INR
INR: 1.89 — ABNORMAL HIGH (ref 0.00–1.49)
Prothrombin Time: 22 seconds — ABNORMAL HIGH (ref 11.6–15.2)

## 2011-07-22 SURGERY — CARDIOVERSION
Anesthesia: General | Wound class: Clean

## 2011-07-22 MED ORDER — SODIUM CHLORIDE 0.9 % IJ SOLN: 3.0000 mL | Freq: Two times a day (BID) | INTRAMUSCULAR | Status: DC

## 2011-07-22 MED ORDER — HYDROCORTISONE 1 % EX CREA: 1.0000 "application " | TOPICAL_CREAM | Freq: Three times a day (TID) | CUTANEOUS | Status: DC | PRN

## 2011-07-22 MED ORDER — SODIUM CHLORIDE 0.9 % IV SOLN: 250.0000 mL | INTRAVENOUS | Status: DC | PRN

## 2011-07-22 MED ORDER — SODIUM CHLORIDE 0.9 % IJ SOLN: 3.0000 mL | INTRAMUSCULAR | Status: DC | PRN

## 2011-07-22 MED ORDER — SODIUM CHLORIDE 0.9 % IV SOLN: INTRAVENOUS | Status: DC

## 2011-07-22 NOTE — Anesthesia Preprocedure Evaluation (Signed)
Anesthesia Evaluation  Patient identified by MRN, date of birth, ID band Patient awake    Reviewed: Allergy & Precautions, H&P , NPO status , Patient's Chart, lab work & pertinent test results, reviewed documented beta blocker date and time   Airway       Dental   Pulmonary          Cardiovascular hypertension, Pt. on medications + dysrhythmias Atrial Fibrillation     Neuro/Psych    GI/Hepatic PUD, GERD-  Medicated,  Endo/Other    Renal/GU      Musculoskeletal   Abdominal   Peds  Hematology   Anesthesia Other Findings   Reproductive/Obstetrics                           Anesthesia Physical Anesthesia Plan  ASA: III  Anesthesia Plan: MAC   Post-op Pain Management:    Induction: Intravenous  Airway Management Planned: Mask  Additional Equipment: None  Intra-op Plan:   Post-operative Plan:   Informed Consent: I have reviewed the patients History and Physical, chart, labs and discussed the procedure including the risks, benefits and alternatives for the proposed anesthesia with the patient or authorized representative who has indicated his/her understanding and acceptance.   Dental advisory given  Plan Discussed with: CRNA  Anesthesia Plan Comments:         Anesthesia Quick Evaluation

## 2011-07-23 ENCOUNTER — Encounter (HOSPITAL_COMMUNITY): Payer: Self-pay | Admitting: Cardiology

## 2011-08-06 DIAGNOSIS — Z7901 Long term (current) use of anticoagulants: Secondary | ICD-10-CM | POA: Diagnosis not present

## 2011-08-06 DIAGNOSIS — I4891 Unspecified atrial fibrillation: Secondary | ICD-10-CM | POA: Diagnosis not present

## 2011-08-07 DIAGNOSIS — D72829 Elevated white blood cell count, unspecified: Secondary | ICD-10-CM | POA: Diagnosis not present

## 2011-08-11 DIAGNOSIS — I8310 Varicose veins of unspecified lower extremity with inflammation: Secondary | ICD-10-CM | POA: Diagnosis not present

## 2011-08-11 DIAGNOSIS — M79609 Pain in unspecified limb: Secondary | ICD-10-CM | POA: Diagnosis not present

## 2011-08-13 DIAGNOSIS — D72829 Elevated white blood cell count, unspecified: Secondary | ICD-10-CM | POA: Diagnosis not present

## 2011-08-13 DIAGNOSIS — Z79899 Other long term (current) drug therapy: Secondary | ICD-10-CM | POA: Diagnosis not present

## 2011-08-13 DIAGNOSIS — E7800 Pure hypercholesterolemia, unspecified: Secondary | ICD-10-CM | POA: Diagnosis not present

## 2011-08-13 DIAGNOSIS — Z7901 Long term (current) use of anticoagulants: Secondary | ICD-10-CM | POA: Diagnosis not present

## 2011-08-13 DIAGNOSIS — I4891 Unspecified atrial fibrillation: Secondary | ICD-10-CM | POA: Diagnosis not present

## 2011-08-20 DIAGNOSIS — I4891 Unspecified atrial fibrillation: Secondary | ICD-10-CM | POA: Diagnosis not present

## 2011-08-20 DIAGNOSIS — Z7901 Long term (current) use of anticoagulants: Secondary | ICD-10-CM | POA: Diagnosis not present

## 2011-08-25 DIAGNOSIS — M79609 Pain in unspecified limb: Secondary | ICD-10-CM | POA: Diagnosis not present

## 2011-08-25 DIAGNOSIS — I8310 Varicose veins of unspecified lower extremity with inflammation: Secondary | ICD-10-CM | POA: Diagnosis not present

## 2011-08-27 ENCOUNTER — Encounter (HOSPITAL_COMMUNITY): Payer: Self-pay

## 2011-08-27 DIAGNOSIS — I4891 Unspecified atrial fibrillation: Secondary | ICD-10-CM | POA: Diagnosis not present

## 2011-08-27 DIAGNOSIS — Z7901 Long term (current) use of anticoagulants: Secondary | ICD-10-CM | POA: Diagnosis not present

## 2011-09-01 ENCOUNTER — Encounter: Payer: Self-pay | Admitting: Cardiology

## 2011-09-01 ENCOUNTER — Other Ambulatory Visit: Payer: Self-pay | Admitting: Cardiology

## 2011-09-02 ENCOUNTER — Other Ambulatory Visit: Payer: Self-pay

## 2011-09-02 ENCOUNTER — Encounter (HOSPITAL_COMMUNITY)
Admission: RE | Disposition: A | Discharge status: Home / Self Care | Payer: Self-pay | Source: Ambulatory Visit | Attending: Cardiology

## 2011-09-02 ENCOUNTER — Encounter (HOSPITAL_COMMUNITY): Payer: Self-pay

## 2011-09-02 ENCOUNTER — Encounter (HOSPITAL_COMMUNITY): Payer: Self-pay | Admitting: Certified Registered"

## 2011-09-02 ENCOUNTER — Ambulatory Visit (HOSPITAL_COMMUNITY): Payer: Medicare Other | Admitting: Certified Registered"

## 2011-09-02 ENCOUNTER — Ambulatory Visit (HOSPITAL_COMMUNITY)
Admission: RE | Admit: 2011-09-02 | Discharge: 2011-09-02 | Disposition: A | Discharge status: Home / Self Care | Payer: Medicare Other | Source: Ambulatory Visit | Attending: Cardiology | Admitting: Cardiology

## 2011-09-02 DIAGNOSIS — M899 Disorder of bone, unspecified: Secondary | ICD-10-CM | POA: Insufficient documentation

## 2011-09-02 DIAGNOSIS — I10 Essential (primary) hypertension: Secondary | ICD-10-CM | POA: Insufficient documentation

## 2011-09-02 DIAGNOSIS — I4891 Unspecified atrial fibrillation: Secondary | ICD-10-CM | POA: Diagnosis present

## 2011-09-02 DIAGNOSIS — Z8711 Personal history of peptic ulcer disease: Secondary | ICD-10-CM | POA: Diagnosis not present

## 2011-09-02 DIAGNOSIS — M949 Disorder of cartilage, unspecified: Secondary | ICD-10-CM | POA: Insufficient documentation

## 2011-09-02 DIAGNOSIS — M1990 Unspecified osteoarthritis, unspecified site: Secondary | ICD-10-CM | POA: Diagnosis not present

## 2011-09-02 DIAGNOSIS — K219 Gastro-esophageal reflux disease without esophagitis: Secondary | ICD-10-CM | POA: Diagnosis not present

## 2011-09-02 HISTORY — DX: Other specified postprocedural states: Z98.890

## 2011-09-02 HISTORY — PX: CARDIOVERSION: SHX1299

## 2011-09-02 HISTORY — DX: Nausea with vomiting, unspecified: R11.2

## 2011-09-02 LAB — PROTIME-INR
INR: 2.99 — ABNORMAL HIGH (ref 0.00–1.49)
Prothrombin Time: 31.5 seconds — ABNORMAL HIGH (ref 11.6–15.2)

## 2011-09-02 SURGERY — CARDIOVERSION
Anesthesia: General | Wound class: Clean

## 2011-09-02 MED ORDER — METOPROLOL TARTRATE 1 MG/ML IV SOLN
INTRAVENOUS | Status: AC
  Filled 2011-09-02: qty 5

## 2011-09-02 MED ORDER — PROPOFOL 10 MG/ML IV BOLUS
INTRAVENOUS | Status: DC | PRN
  Administered 2011-09-02: 70 mg via INTRAVENOUS

## 2011-09-02 MED ORDER — METOPROLOL SUCCINATE ER 25 MG PO TB24: 25.0000 mg | ORAL_TABLET | Freq: Every day | ORAL | Status: DC

## 2011-09-02 NOTE — Interval H&P Note (Signed)
History and Physical Interval Note:  09/02/2011 1:53 PM  Mallory Stephens  has presented today for surgery, with the diagnosis of afib  The various methods of treatment have been discussed with the patient and family. After consideration of risks, benefits and other options for treatment, the patient has consented to  Procedure(s): CARDIOVERSION as a surgical intervention .  The patients' history has been reviewed, patient examined, no change in status, stable for surgery.  I have reviewed the patients' chart and labs.  Questions were answered to the patient's satisfaction.     Adalie Mand R

## 2011-09-02 NOTE — Interval H&P Note (Signed)
History and Physical Interval Note:  09/02/2011 1:37 PM  Mallory Stephens  has presented today for surgery, with the diagnosis of afib  The various methods of treatment have been discussed with the patient and family. After consideration of risks, benefits and other options for treatment, the patient has consented to  Procedure(s): CARDIOVERSION as a surgical intervention .  The patients' history has been reviewed, patient examined, no change in status, stable for surgery.  I have reviewed the patients' chart and labs.  Questions were answered to the patient's satisfaction.     TURNER,TRACI R

## 2011-09-02 NOTE — Discharge Instructions (Signed)
Electrical Cardioversion Cardioversion is the delivery of a jolt of electricity to change the rhythm of the heart. Sticky patches or metal paddles are placed on the chest to deliver the electricity from a special device. This is done to restore a normal rhythm. A rhythm that is too fast or not regular keeps the heart from pumping well. Compared to medicines used to change an abnormal rhythm, cardioversion is faster and works better. It is also unpleasant and may dislodge blood clots from the heart. WHEN WOULD THIS BE DONE?  In an emergency:   There is low or no blood pressure as a result of the heart rhythm.   Normal rhythm must be restored as fast as possible to protect the brain and heart from further damage.   It may save a life.   For less serious heart rhythms, such as atrial fibrillation or flutter, in which:   The heart is beating too fast or is not regular.   The heart is still able to pump enough blood, but not as well as it should.   Medicine to change the rhythm has not worked.   It is safe to wait in order to allow time for preparation.  LET YOUR CAREGIVER KNOW ABOUT:   Every medicine you are taking. It is very important to do this! Know when to take or stop taking any of them.   Any time in the past that you have felt your heart was not beating normally.  RISKS AND COMPLICATIONS   Clots may form in the chambers of the heart if it is beating too fast. These clots may be dislodged during the procedure and travel to other parts of the body.   There is risk of a stroke during and after the procedure if a clot moves. Blood thinners lower this risk.   You may have a special test of your heart (TEE) to make sure there are no clots in your heart.  BEFORE THE PROCEDURE   You may have some tests to see how well your heart is working.   You may start taking blood thinners so your blood does not clot as easily.   Other drugs may be given to help your heart work better.    PROCEDURE (SCHEDULED)  The procedure is typically done in a hospital by a heart doctor (cardiologist).   You will be told when and where to go.   You may be given some medicine through an intravenous (IV) access to reduce discomfort and make you sleepy before the procedure.   Your whole body may move when the shock is delivered. Your chest may feel sore.   You may be able to go home after a few hours. Your heart rhythm will be watched to make sure it does not change.  HOME CARE INSTRUCTIONS   Only take medicine as directed by your caregiver. Be sure you understand how and when to take your medicine.   Learn how to feel your pulse and check it often.   Limit your activity for 48 hours.   Avoid caffeine and other stimulants as directed.  SEEK MEDICAL CARE IF:   You feel like your heart is beating too fast or your pulse is not regular.   You have any questions about your medicines.   You have bleeding that will not stop.  SEEK IMMEDIATE MEDICAL CARE IF:   You are dizzy or feel faint.   It is hard to breathe or you feel short of breath.     There is a change in discomfort in your chest.   Your speech is slurred or you have trouble moving your arm or leg on one side.   You get a muscle cramp.   Your fingers or toes turn cold or blue.  MAKE SURE YOU:   Understand these instructions.   Will watch your condition.   Will get help right away if you are not doing well or get worse.  Document Released: 07/11/2002 Document Revised: 04/02/2011 Document Reviewed: 11/10/2007 [employer] Patient Information 2012 [employer]. 

## 2011-09-02 NOTE — Preoperative (Signed)
Beta Blockers   Reason not to administer Beta Blockers:Not Applicable 

## 2011-09-02 NOTE — Transfer of Care (Signed)
Immediate Anesthesia Transfer of Care Note  Patient: Mallory Stephens  Procedure(s) Performed:  CARDIOVERSION  Patient Location: PACU and Short Stay  Anesthesia Type: General  Level of Consciousness: awake, alert , oriented and patient cooperative  Airway & Oxygen Therapy: Patient Spontanous Breathing  Post-op Assessment: Post -op Vital signs reviewed and stable and Patient moving all extremities  Post vital signs: Reviewed and stable  Complications: No apparent anesthesia complications

## 2011-09-02 NOTE — H&P (Signed)
Office Visit     Patient: Mallory Stephens, Mallory Stephens Provider: Paschal Dopp. Glo Herring, NP  DOB: Nov 04, 1932 Age: 78 Y Sex: Female Date: 08/27/2011  Phone: [phone]   Address: [place], [place]  Pcp: Mallory Stephens       Subjective:     CC:    1. TT/CV WORKUP AND Mallory Stephens.        HPI:  General:  Mallory Stephens is Stephens 78 yo female followed by Mallory Stephens with Stephens hx of Atrial fibrillation with 2 previous cardioversions that has developed reoccurance of her Atrial fibrillation. She initially was to have [place] 07/22/11 but her INR was < 2.0 that day. She returns today after appropriately therapuetic INRS, including INR checked today by Mallory Stephens and is now ready to proceed with cardioversion. She states she is feeling well and denies any : chest pain, dizziness, syncope, shortness of breath nor PND. She is having veins injected in her legs and this has helped the pain alot and she will have occasional swelling but not this am..        ROS:  as noted in HPI, no fever, chills, cough nor congestion, no neurological changes, no change with appetite, no GI complaints...no bleeding.       Medical History: Elevated Cholesterol, Osteoarthritis, hx of PUD, Esophageal reflux, Hypertension, Osteopenia (-1.2; -1.5) abnormal since 01/2007, paroxysmal atrial fibrillation s/p DCCV 03/10, s/p DCCV 01/2009, multinodular goiter by CT, 10/2008 - benign cytopath, Mallory. Harlow Stephens, Pulmonary nodules on CXR and Chest CT, chronic granulomatous changes, fatty liver by CT, 10/2008, persistent mild leukocytosis with lymphocytosis (reactive), Mallory. Julien Stephens, 2010, OAB, 2D echo 2009 mild MR/TR and AR, chronic dizziness, CT head in 04/2010 was normal, carotids were negative, VEIN: Mallory. Aleda Stephens.        Surgical History: right breast surgery for benign lesion 1962, breast surgery both breasts for benign lesions 1971, cystoscopy 1974, aspiration of cyst 1978, Stephens&C 1979, aspiration assessed 1980, right knee  replacement 2002, cholecystectomy 2003, left knee replacement 2007.        Hospitalization/Major Diagnostic Procedure: Amiodarone load 10/23/2008, cardio version times 2 2010.        Family History: Father: deceased 31 yrs stroke Mother: deceased 24 yrs stroke Brother 1: deceased 3 yrs cardiac issues Brother2: deceased 61 yrs prostate cancer Brother 3: deceased 50 yrs prostate cancer Sister 1: alive 9 yrs breast cancer,, osteoporosis Sister 2: alive 53 yrs arthritis Sister 3: alive 63 yrs scoliosis        Social History:  General: History of smoking cigarettes: Former smoker, Quit in year 1990's. no Alcohol. Caffeine: yes, coffee, 2 servings daily. no Recreational drug use. Diet: yes, low fat. Exercise: yes, walks, gym, intermittent. Occupation: unemployed, retired. Marital Status: single, widowed. Children: none. Seat belt use: yes.        Medications: Multivitamins Capsule 1 capsule Once Stephens day, Vitamin D3 1000 UNIT Tablet 1 tablet sometimes, Omega 3 1000 MG Capsule 2 capsule once Stephens day, Aspirin 81 MG Tablet Chewable 1 tablet Once Stephens day, Pravastatin Sodium 20 MG Tablet 1 tablet Once Stephens day, Hyzaar 100-12.5 MG Tablet 1 tablet Once Stephens day, Norvasc 10 MG Tablet 1 tablet Once Stephens day, Paxil 10 MG Tablet 1 tablet in the morning Once Stephens day, Premarin 0.625 MG/GM Cream as directed twice Stephens week, Zantac 300 MG Tablet 1 tablet once Stephens day, Celebrex 200 MG Capsule 1 capsule Once Stephens day prn, Warfarin Sodium 5 MG Tablet per pharmD 5 mg  qd except 7.5 mg T/F, Medication List reviewed and reconciled with the patient       Allergies: Sulfa drugs (for allergy): rash: Allergy.       Objective:     Vitals: Wt 156, Wt change .1 lb, Ht 65 1/2, BMI 25.56, Pulse sitting 80, BP sitting 110/80.       Examination:  Cardiology, General:  GENERAL APPEARANCE: pleasant, NAD. HEENT: unremarkable. CAROTID UPSTROKE: normal, no bruit. JVD: flat. HEART SOUNDS: normal S1, S2, no S3 or S4, irregularly irregular. MURMUR: absent.  LUNGS: no rales or wheezes. ABDOMEN: soft, non tender, positive bowel sounds, no masses felt. EXTREMITIES: bilateral trace pitting edema. PERIPHERAL PULSES: 2 plus bilateral.        Assessment:     Assessment:  1. Atrial fibrillation - 427.31 (Primary)    Plan:     1. Atrial fibrillation Continue Aspirin Tablet Chewable, 81 MG, 1 tablet, Orally, Once Stephens day ; Continue Warfarin Sodium Tablet, 5 MG, per pharmD, Orally, 5 mg qd except 7.5 mg T/F .  LAB: Basic Metabolic    GLUCOSE [id number] - mg/dL H   BUN 24 6-26 - mg/dL    CREATININE 1.08 0.60-1.30 - mg/dl    eGFR (NON-AFRICAN AMERICAN) 49 >60 - calc L   eGFR (AFRICAN AMERICAN) 60 >60 - calc L   SODIUM 139 136-145 - mmol/L    POTASSIUM 4.0 3.5-5.5 - mmol/L    CHLORIDE 102 98-107 - mmol/L    C02 30 22-32 - mg/dL    ANION GAP 11.0 6.0-20.0 - mmol/L    CALCIUM 10.3 8.6-10.3 - mg/dL     Mallory Stephens 08/27/2011 04:37:37 PM > ok for cardioversion Mallory Stephens 08/28/2011 08:19:53 AM >    LAB: CBC with Diff    WBC 11.5 4.0-11.0 - K/ul H   RBC 4.64 4.20-5.40 - M/uL    HGB 14.6 12.0-16.0 - g/dL    HCT 42.9 37.0-47.0 - %    MCH 31.5 27.0-33.0 - pg    MPV 7.1 7.5-10.7 - fL L   MCV 92.5 81.0-99.0 - fL    MCHC 34.0 32.0-36.0 - g/dL    RDW 11.6 11.5-15.5 - %    PLT 253 150-400 - K/uL    NEUT % 44.7 43.3-71.9 - %    LYMPH% 41.1 16.8-43.5 - %    MONO % 8.5 4.6-12.4 - %    EOS % 4.8 0.0-7.8 - %    BASO % 0.9 0.0-1.0 - %    NEUT # 5.1 1.9-7.2 - K/uL    LYMPH# 4.70 1.10-2.70 - K/uL H   MONO # 1.0 0.3-0.8 - K/uL H   EOS # 0.6 0.0-0.6 - K/uL    BASO # 0.1 0.0-0.1 - K/uL     Mallory Stephens 08/27/2011 12:40:38 PM > ok for cardioversion Mallory Stephens 08/27/2011 12:47:16 PM >    Diagnostic Imaging:EKG Atrial fibrillation, Mallory Stephens 08/27/2011 10:37:29 AM >  Coumadin therapy followed today by Mallory Stephens, and was therapeutic today at 2.7 Plan to proceed with cardioversion by Mallory Stephens with risk and benefits reviewed, including  possible side effects to sedation, risk of arrhythmias, strokes and irritation to skin. pt agreeable to proceed.       2. Others Continue Multivitamins Capsule, 1 capsule, Orally, Once Stephens day ; Continue Vitamin D3 Tablet, 1000 UNIT, 1 tablet, Orally, sometimes ; Continue Omega 3 Capsule, 1000 MG, 2 capsule, Orally, once Stephens day ; Continue Pravastatin Sodium Tablet, 20 MG, 1 tablet, Orally, Once Stephens  day ; Continue Hyzaar Tablet, 100-12.5 MG, 1 tablet, Orally, Once Stephens day ; Continue Norvasc Tablet, 10 MG, 1 tablet, Orally, Once Stephens day .        Immunizations:        Labs:        Procedure Codes: 93000 EKG I AND R, [id number] ECL BMP, 85025 ECL CBC PLATELET DIFF, [id number] BLOOD COLLECTION ROUTINE VENIPUNCTURE       Preventive:         Follow Up: TT pending cardioversion (Reason: Atrail fibrillation)      Provider: Paschal Dopp. Glo Herring, NP  Patient: Mallory Stephens, Mallory Stephens DOB: 10-03-1932 Date: 08/27/2011

## 2011-09-02 NOTE — Anesthesia Postprocedure Evaluation (Signed)
  Anesthesia Post-op Note  Patient: Mallory Stephens  Procedure(s) Performed:  CARDIOVERSION  Patient Location: Short Stay  Anesthesia Type: General  Level of Consciousness: awake, alert  and oriented  Airway and Oxygen Therapy: Patient Spontanous Breathing  Post-op Pain: none  Post-op Assessment: Post-op Vital signs reviewed, Patient's Cardiovascular Status Stable, Respiratory Function Stable, Patent Airway and No signs of Nausea or vomiting  Post-op Vital Signs: Reviewed and stable  Complications: No apparent anesthesia complications

## 2011-09-02 NOTE — Op Note (Signed)
Electrical Cardioversion Procedure Note AMITA ATAYDE [id number] 01-17-1933  Procedure: Electrical Cardioversion Indications:  Atrial Fibrillation  Time Out: Verified patient identification, verified procedure,medications/allergies/relevent history reviewed, required imaging and test results available.  Performed  Procedure Details  The patient was NPO after midnight. Anesthesia was administered at the beside  by Dr.Crews with 70mg  of propofol.  Cardioversion was done with synchronized biphasic defibrillation with AP pads with 150J.  The patient converted to normal sinus rhythm. The patient then reverted back to atrial fibrillation with RVR and was cardioverted again with a 200J biphasic shock converting to NSR with PAC's  The patient tolerated the procedure well   IMPRESSION:  Successful cardioversion of atrial fibrillation  PLAN:  Continue current medications Start Toprol 25mg  daily for Afib suppression Followup in my office with my NP in 2 weeks    Dervin Vore R 09/02/2011, 1:55 PM

## 2011-09-02 NOTE — Anesthesia Preprocedure Evaluation (Addendum)
Anesthesia Evaluation  Patient identified by MRN, date of birth, ID band Patient awake    Reviewed: Allergy & Precautions, H&P , NPO status , Patient's Chart, lab work & pertinent test results, reviewed documented beta blocker date and time   History of Anesthesia Complications (+) PONV  Airway Mallampati: II TM Distance: >3 FB Neck ROM: Full    Dental  (+) Teeth Intact   Pulmonary          Cardiovascular hypertension, Pt. on medications + dysrhythmias Atrial Fibrillation Irregular Normal    Neuro/Psych    GI/Hepatic PUD, GERD-  ,  Endo/Other    Renal/GU      Musculoskeletal   Abdominal   Peds  Hematology   Anesthesia Other Findings   Reproductive/Obstetrics                         Anesthesia Physical Anesthesia Plan  ASA: III  Anesthesia Plan: General   Post-op Pain Management:    Induction: Intravenous  Airway Management Planned: Mask  Additional Equipment:   Intra-op Plan:   Post-operative Plan:   Informed Consent: I have reviewed the patients History and Physical, chart, labs and discussed the procedure including the risks, benefits and alternatives for the proposed anesthesia with the patient or authorized representative who has indicated his/her understanding and acceptance.     Plan Discussed with: CRNA, Anesthesiologist and Surgeon  Anesthesia Plan Comments:         Anesthesia Quick Evaluation

## 2011-09-03 ENCOUNTER — Encounter (HOSPITAL_COMMUNITY): Payer: Self-pay | Admitting: Cardiology

## 2011-09-08 DIAGNOSIS — I8310 Varicose veins of unspecified lower extremity with inflammation: Secondary | ICD-10-CM | POA: Diagnosis not present

## 2011-09-08 DIAGNOSIS — M79609 Pain in unspecified limb: Secondary | ICD-10-CM | POA: Diagnosis not present

## 2011-09-08 DIAGNOSIS — M7981 Nontraumatic hematoma of soft tissue: Secondary | ICD-10-CM | POA: Diagnosis not present

## 2011-09-16 DIAGNOSIS — Z7901 Long term (current) use of anticoagulants: Secondary | ICD-10-CM | POA: Diagnosis not present

## 2011-09-16 DIAGNOSIS — I4891 Unspecified atrial fibrillation: Secondary | ICD-10-CM | POA: Diagnosis not present

## 2011-09-16 DIAGNOSIS — I10 Essential (primary) hypertension: Secondary | ICD-10-CM | POA: Diagnosis not present

## 2011-09-29 DIAGNOSIS — I4891 Unspecified atrial fibrillation: Secondary | ICD-10-CM | POA: Diagnosis not present

## 2011-09-29 DIAGNOSIS — I10 Essential (primary) hypertension: Secondary | ICD-10-CM | POA: Diagnosis not present

## 2011-09-29 DIAGNOSIS — Z7901 Long term (current) use of anticoagulants: Secondary | ICD-10-CM | POA: Diagnosis not present

## 2011-10-03 DIAGNOSIS — I8310 Varicose veins of unspecified lower extremity with inflammation: Secondary | ICD-10-CM | POA: Diagnosis not present

## 2011-10-06 DIAGNOSIS — I8310 Varicose veins of unspecified lower extremity with inflammation: Secondary | ICD-10-CM | POA: Diagnosis not present

## 2011-10-09 DIAGNOSIS — Z7901 Long term (current) use of anticoagulants: Secondary | ICD-10-CM | POA: Diagnosis not present

## 2011-10-09 DIAGNOSIS — I4891 Unspecified atrial fibrillation: Secondary | ICD-10-CM | POA: Diagnosis not present

## 2011-10-30 DIAGNOSIS — Z7901 Long term (current) use of anticoagulants: Secondary | ICD-10-CM | POA: Diagnosis not present

## 2011-10-30 DIAGNOSIS — I4891 Unspecified atrial fibrillation: Secondary | ICD-10-CM | POA: Diagnosis not present

## 2011-11-03 DIAGNOSIS — H2510 Age-related nuclear cataract, unspecified eye: Secondary | ICD-10-CM | POA: Diagnosis not present

## 2011-11-05 DIAGNOSIS — M79609 Pain in unspecified limb: Secondary | ICD-10-CM | POA: Diagnosis not present

## 2011-11-05 DIAGNOSIS — I8310 Varicose veins of unspecified lower extremity with inflammation: Secondary | ICD-10-CM | POA: Diagnosis not present

## 2011-11-05 DIAGNOSIS — M7981 Nontraumatic hematoma of soft tissue: Secondary | ICD-10-CM | POA: Diagnosis not present

## 2011-11-11 DIAGNOSIS — H04129 Dry eye syndrome of unspecified lacrimal gland: Secondary | ICD-10-CM | POA: Diagnosis not present

## 2011-11-11 DIAGNOSIS — H2510 Age-related nuclear cataract, unspecified eye: Secondary | ICD-10-CM | POA: Diagnosis not present

## 2011-11-11 DIAGNOSIS — H40039 Anatomical narrow angle, unspecified eye: Secondary | ICD-10-CM | POA: Diagnosis not present

## 2011-11-13 DIAGNOSIS — I4891 Unspecified atrial fibrillation: Secondary | ICD-10-CM | POA: Diagnosis not present

## 2011-11-13 DIAGNOSIS — Z7901 Long term (current) use of anticoagulants: Secondary | ICD-10-CM | POA: Diagnosis not present

## 2011-11-17 DIAGNOSIS — H21569 Pupillary abnormality, unspecified eye: Secondary | ICD-10-CM | POA: Diagnosis not present

## 2011-11-17 DIAGNOSIS — H2510 Age-related nuclear cataract, unspecified eye: Secondary | ICD-10-CM | POA: Diagnosis not present

## 2011-11-17 DIAGNOSIS — H5700 Unspecified anomaly of pupillary function: Secondary | ICD-10-CM | POA: Diagnosis not present

## 2011-11-27 DIAGNOSIS — M79609 Pain in unspecified limb: Secondary | ICD-10-CM | POA: Diagnosis not present

## 2011-11-27 DIAGNOSIS — I8310 Varicose veins of unspecified lower extremity with inflammation: Secondary | ICD-10-CM | POA: Diagnosis not present

## 2011-11-28 DIAGNOSIS — Z7901 Long term (current) use of anticoagulants: Secondary | ICD-10-CM | POA: Diagnosis not present

## 2011-11-28 DIAGNOSIS — I4891 Unspecified atrial fibrillation: Secondary | ICD-10-CM | POA: Diagnosis not present

## 2011-12-08 DIAGNOSIS — H2510 Age-related nuclear cataract, unspecified eye: Secondary | ICD-10-CM | POA: Diagnosis not present

## 2011-12-15 DIAGNOSIS — H2510 Age-related nuclear cataract, unspecified eye: Secondary | ICD-10-CM | POA: Diagnosis not present

## 2011-12-15 DIAGNOSIS — H21569 Pupillary abnormality, unspecified eye: Secondary | ICD-10-CM | POA: Diagnosis not present

## 2011-12-26 DIAGNOSIS — Z7901 Long term (current) use of anticoagulants: Secondary | ICD-10-CM | POA: Diagnosis not present

## 2011-12-26 DIAGNOSIS — I4891 Unspecified atrial fibrillation: Secondary | ICD-10-CM | POA: Diagnosis not present

## 2011-12-30 DIAGNOSIS — I8310 Varicose veins of unspecified lower extremity with inflammation: Secondary | ICD-10-CM | POA: Diagnosis not present

## 2011-12-30 DIAGNOSIS — M79609 Pain in unspecified limb: Secondary | ICD-10-CM | POA: Diagnosis not present

## 2012-01-14 DIAGNOSIS — I4891 Unspecified atrial fibrillation: Secondary | ICD-10-CM | POA: Diagnosis not present

## 2012-01-14 DIAGNOSIS — Z79899 Other long term (current) drug therapy: Secondary | ICD-10-CM | POA: Diagnosis not present

## 2012-01-14 DIAGNOSIS — M79609 Pain in unspecified limb: Secondary | ICD-10-CM | POA: Diagnosis not present

## 2012-01-14 DIAGNOSIS — Z7901 Long term (current) use of anticoagulants: Secondary | ICD-10-CM | POA: Diagnosis not present

## 2012-01-14 DIAGNOSIS — I10 Essential (primary) hypertension: Secondary | ICD-10-CM | POA: Diagnosis not present

## 2012-01-20 DIAGNOSIS — M79609 Pain in unspecified limb: Secondary | ICD-10-CM | POA: Diagnosis not present

## 2012-01-20 DIAGNOSIS — I10 Essential (primary) hypertension: Secondary | ICD-10-CM | POA: Diagnosis not present

## 2012-01-20 DIAGNOSIS — Z79899 Other long term (current) drug therapy: Secondary | ICD-10-CM | POA: Diagnosis not present

## 2012-01-20 DIAGNOSIS — I4891 Unspecified atrial fibrillation: Secondary | ICD-10-CM | POA: Diagnosis not present

## 2012-01-26 DIAGNOSIS — M7989 Other specified soft tissue disorders: Secondary | ICD-10-CM | POA: Diagnosis not present

## 2012-01-26 DIAGNOSIS — M79609 Pain in unspecified limb: Secondary | ICD-10-CM | POA: Diagnosis not present

## 2012-02-03 DIAGNOSIS — Z7901 Long term (current) use of anticoagulants: Secondary | ICD-10-CM | POA: Diagnosis not present

## 2012-02-03 DIAGNOSIS — I4891 Unspecified atrial fibrillation: Secondary | ICD-10-CM | POA: Diagnosis not present

## 2012-02-03 DIAGNOSIS — E785 Hyperlipidemia, unspecified: Secondary | ICD-10-CM | POA: Diagnosis not present

## 2012-02-03 DIAGNOSIS — Z79899 Other long term (current) drug therapy: Secondary | ICD-10-CM | POA: Diagnosis not present

## 2012-02-11 DIAGNOSIS — I4891 Unspecified atrial fibrillation: Secondary | ICD-10-CM | POA: Diagnosis not present

## 2012-02-11 DIAGNOSIS — Z7901 Long term (current) use of anticoagulants: Secondary | ICD-10-CM | POA: Diagnosis not present

## 2012-02-12 DIAGNOSIS — M79609 Pain in unspecified limb: Secondary | ICD-10-CM | POA: Diagnosis not present

## 2012-02-12 DIAGNOSIS — M719 Bursopathy, unspecified: Secondary | ICD-10-CM | POA: Diagnosis not present

## 2012-02-12 DIAGNOSIS — M67919 Unspecified disorder of synovium and tendon, unspecified shoulder: Secondary | ICD-10-CM | POA: Diagnosis not present

## 2012-02-12 DIAGNOSIS — E042 Nontoxic multinodular goiter: Secondary | ICD-10-CM | POA: Diagnosis not present

## 2012-02-12 DIAGNOSIS — I8310 Varicose veins of unspecified lower extremity with inflammation: Secondary | ICD-10-CM | POA: Diagnosis not present

## 2012-02-12 DIAGNOSIS — K7689 Other specified diseases of liver: Secondary | ICD-10-CM | POA: Diagnosis not present

## 2012-02-12 DIAGNOSIS — R918 Other nonspecific abnormal finding of lung field: Secondary | ICD-10-CM | POA: Diagnosis not present

## 2012-02-12 DIAGNOSIS — N952 Postmenopausal atrophic vaginitis: Secondary | ICD-10-CM | POA: Diagnosis not present

## 2012-03-17 DIAGNOSIS — I4891 Unspecified atrial fibrillation: Secondary | ICD-10-CM | POA: Diagnosis not present

## 2012-03-17 DIAGNOSIS — Z7901 Long term (current) use of anticoagulants: Secondary | ICD-10-CM | POA: Diagnosis not present

## 2012-03-18 DIAGNOSIS — I8310 Varicose veins of unspecified lower extremity with inflammation: Secondary | ICD-10-CM | POA: Diagnosis not present

## 2012-03-18 DIAGNOSIS — M79609 Pain in unspecified limb: Secondary | ICD-10-CM | POA: Diagnosis not present

## 2012-04-09 DIAGNOSIS — D1801 Hemangioma of skin and subcutaneous tissue: Secondary | ICD-10-CM | POA: Diagnosis not present

## 2012-04-09 DIAGNOSIS — D235 Other benign neoplasm of skin of trunk: Secondary | ICD-10-CM | POA: Diagnosis not present

## 2012-04-14 DIAGNOSIS — I4891 Unspecified atrial fibrillation: Secondary | ICD-10-CM | POA: Diagnosis not present

## 2012-04-14 DIAGNOSIS — Z7901 Long term (current) use of anticoagulants: Secondary | ICD-10-CM | POA: Diagnosis not present

## 2012-04-19 DIAGNOSIS — N318 Other neuromuscular dysfunction of bladder: Secondary | ICD-10-CM | POA: Diagnosis not present

## 2012-05-12 DIAGNOSIS — Z7901 Long term (current) use of anticoagulants: Secondary | ICD-10-CM | POA: Diagnosis not present

## 2012-05-12 DIAGNOSIS — I4891 Unspecified atrial fibrillation: Secondary | ICD-10-CM | POA: Diagnosis not present

## 2012-05-19 DIAGNOSIS — N318 Other neuromuscular dysfunction of bladder: Secondary | ICD-10-CM | POA: Diagnosis not present

## 2012-05-19 DIAGNOSIS — H1044 Vernal conjunctivitis: Secondary | ICD-10-CM | POA: Diagnosis not present

## 2012-06-01 DIAGNOSIS — K219 Gastro-esophageal reflux disease without esophagitis: Secondary | ICD-10-CM | POA: Diagnosis not present

## 2012-06-01 DIAGNOSIS — Z23 Encounter for immunization: Secondary | ICD-10-CM | POA: Diagnosis not present

## 2012-06-01 DIAGNOSIS — N318 Other neuromuscular dysfunction of bladder: Secondary | ICD-10-CM | POA: Diagnosis not present

## 2012-06-01 DIAGNOSIS — J329 Chronic sinusitis, unspecified: Secondary | ICD-10-CM | POA: Diagnosis not present

## 2012-06-09 DIAGNOSIS — Z7901 Long term (current) use of anticoagulants: Secondary | ICD-10-CM | POA: Diagnosis not present

## 2012-06-09 DIAGNOSIS — I4891 Unspecified atrial fibrillation: Secondary | ICD-10-CM | POA: Diagnosis not present

## 2012-06-25 DIAGNOSIS — N318 Other neuromuscular dysfunction of bladder: Secondary | ICD-10-CM | POA: Diagnosis not present

## 2012-06-25 DIAGNOSIS — N3946 Mixed incontinence: Secondary | ICD-10-CM | POA: Diagnosis not present

## 2012-07-14 DIAGNOSIS — I10 Essential (primary) hypertension: Secondary | ICD-10-CM | POA: Diagnosis not present

## 2012-07-14 DIAGNOSIS — Z79899 Other long term (current) drug therapy: Secondary | ICD-10-CM | POA: Diagnosis not present

## 2012-07-14 DIAGNOSIS — Z7901 Long term (current) use of anticoagulants: Secondary | ICD-10-CM | POA: Diagnosis not present

## 2012-07-14 DIAGNOSIS — I4891 Unspecified atrial fibrillation: Secondary | ICD-10-CM | POA: Diagnosis not present

## 2012-08-12 DIAGNOSIS — I4891 Unspecified atrial fibrillation: Secondary | ICD-10-CM | POA: Diagnosis not present

## 2012-08-12 DIAGNOSIS — Z09 Encounter for follow-up examination after completed treatment for conditions other than malignant neoplasm: Secondary | ICD-10-CM | POA: Diagnosis not present

## 2012-08-12 DIAGNOSIS — Z7901 Long term (current) use of anticoagulants: Secondary | ICD-10-CM | POA: Diagnosis not present

## 2012-08-12 DIAGNOSIS — R928 Other abnormal and inconclusive findings on diagnostic imaging of breast: Secondary | ICD-10-CM | POA: Diagnosis not present

## 2012-08-16 DIAGNOSIS — N3946 Mixed incontinence: Secondary | ICD-10-CM | POA: Diagnosis not present

## 2012-08-19 DIAGNOSIS — I8310 Varicose veins of unspecified lower extremity with inflammation: Secondary | ICD-10-CM | POA: Diagnosis not present

## 2012-08-19 DIAGNOSIS — M79609 Pain in unspecified limb: Secondary | ICD-10-CM | POA: Diagnosis not present

## 2012-08-23 DIAGNOSIS — I8310 Varicose veins of unspecified lower extremity with inflammation: Secondary | ICD-10-CM | POA: Diagnosis not present

## 2012-08-23 DIAGNOSIS — M79609 Pain in unspecified limb: Secondary | ICD-10-CM | POA: Diagnosis not present

## 2012-09-10 DIAGNOSIS — Z7901 Long term (current) use of anticoagulants: Secondary | ICD-10-CM | POA: Diagnosis not present

## 2012-09-10 DIAGNOSIS — I4891 Unspecified atrial fibrillation: Secondary | ICD-10-CM | POA: Diagnosis not present

## 2012-09-23 DIAGNOSIS — I4891 Unspecified atrial fibrillation: Secondary | ICD-10-CM | POA: Diagnosis not present

## 2012-09-23 DIAGNOSIS — Z7901 Long term (current) use of anticoagulants: Secondary | ICD-10-CM | POA: Diagnosis not present

## 2012-10-20 DIAGNOSIS — M25649 Stiffness of unspecified hand, not elsewhere classified: Secondary | ICD-10-CM | POA: Diagnosis not present

## 2012-10-20 DIAGNOSIS — M79609 Pain in unspecified limb: Secondary | ICD-10-CM | POA: Diagnosis not present

## 2012-10-20 DIAGNOSIS — M6281 Muscle weakness (generalized): Secondary | ICD-10-CM | POA: Diagnosis not present

## 2012-10-21 DIAGNOSIS — I4891 Unspecified atrial fibrillation: Secondary | ICD-10-CM | POA: Diagnosis not present

## 2012-10-21 DIAGNOSIS — Z7901 Long term (current) use of anticoagulants: Secondary | ICD-10-CM | POA: Diagnosis not present

## 2012-10-26 DIAGNOSIS — M6281 Muscle weakness (generalized): Secondary | ICD-10-CM | POA: Diagnosis not present

## 2012-10-26 DIAGNOSIS — M19049 Primary osteoarthritis, unspecified hand: Secondary | ICD-10-CM | POA: Diagnosis not present

## 2012-10-26 DIAGNOSIS — M79609 Pain in unspecified limb: Secondary | ICD-10-CM | POA: Diagnosis not present

## 2012-10-28 DIAGNOSIS — M79609 Pain in unspecified limb: Secondary | ICD-10-CM | POA: Diagnosis not present

## 2012-10-28 DIAGNOSIS — M25649 Stiffness of unspecified hand, not elsewhere classified: Secondary | ICD-10-CM | POA: Diagnosis not present

## 2012-10-28 DIAGNOSIS — M6281 Muscle weakness (generalized): Secondary | ICD-10-CM | POA: Diagnosis not present

## 2012-11-02 DIAGNOSIS — M6281 Muscle weakness (generalized): Secondary | ICD-10-CM | POA: Diagnosis not present

## 2012-11-02 DIAGNOSIS — M79609 Pain in unspecified limb: Secondary | ICD-10-CM | POA: Diagnosis not present

## 2012-11-02 DIAGNOSIS — M19049 Primary osteoarthritis, unspecified hand: Secondary | ICD-10-CM | POA: Diagnosis not present

## 2012-11-05 DIAGNOSIS — M6281 Muscle weakness (generalized): Secondary | ICD-10-CM | POA: Diagnosis not present

## 2012-11-05 DIAGNOSIS — M79609 Pain in unspecified limb: Secondary | ICD-10-CM | POA: Diagnosis not present

## 2012-11-05 DIAGNOSIS — M19049 Primary osteoarthritis, unspecified hand: Secondary | ICD-10-CM | POA: Diagnosis not present

## 2012-11-09 DIAGNOSIS — M899 Disorder of bone, unspecified: Secondary | ICD-10-CM | POA: Diagnosis not present

## 2012-11-09 DIAGNOSIS — E042 Nontoxic multinodular goiter: Secondary | ICD-10-CM | POA: Diagnosis not present

## 2012-11-09 DIAGNOSIS — I10 Essential (primary) hypertension: Secondary | ICD-10-CM | POA: Diagnosis not present

## 2012-11-09 DIAGNOSIS — Z0000 Encounter for general adult medical examination without abnormal findings: Secondary | ICD-10-CM | POA: Diagnosis not present

## 2012-11-09 DIAGNOSIS — M19049 Primary osteoarthritis, unspecified hand: Secondary | ICD-10-CM | POA: Diagnosis not present

## 2012-11-09 DIAGNOSIS — N183 Chronic kidney disease, stage 3 unspecified: Secondary | ICD-10-CM | POA: Diagnosis not present

## 2012-11-09 DIAGNOSIS — Z7901 Long term (current) use of anticoagulants: Secondary | ICD-10-CM | POA: Diagnosis not present

## 2012-11-09 DIAGNOSIS — M79609 Pain in unspecified limb: Secondary | ICD-10-CM | POA: Diagnosis not present

## 2012-11-09 DIAGNOSIS — M949 Disorder of cartilage, unspecified: Secondary | ICD-10-CM | POA: Diagnosis not present

## 2012-11-09 DIAGNOSIS — M6281 Muscle weakness (generalized): Secondary | ICD-10-CM | POA: Diagnosis not present

## 2012-11-11 DIAGNOSIS — M6281 Muscle weakness (generalized): Secondary | ICD-10-CM | POA: Diagnosis not present

## 2012-11-11 DIAGNOSIS — M19049 Primary osteoarthritis, unspecified hand: Secondary | ICD-10-CM | POA: Diagnosis not present

## 2012-11-11 DIAGNOSIS — M79609 Pain in unspecified limb: Secondary | ICD-10-CM | POA: Diagnosis not present

## 2012-11-15 DIAGNOSIS — R7309 Other abnormal glucose: Secondary | ICD-10-CM | POA: Diagnosis not present

## 2012-11-15 DIAGNOSIS — R7989 Other specified abnormal findings of blood chemistry: Secondary | ICD-10-CM | POA: Diagnosis not present

## 2012-11-18 DIAGNOSIS — M6281 Muscle weakness (generalized): Secondary | ICD-10-CM | POA: Diagnosis not present

## 2012-11-18 DIAGNOSIS — I4891 Unspecified atrial fibrillation: Secondary | ICD-10-CM | POA: Diagnosis not present

## 2012-11-18 DIAGNOSIS — M79609 Pain in unspecified limb: Secondary | ICD-10-CM | POA: Diagnosis not present

## 2012-11-18 DIAGNOSIS — M19049 Primary osteoarthritis, unspecified hand: Secondary | ICD-10-CM | POA: Diagnosis not present

## 2012-11-18 DIAGNOSIS — Z7901 Long term (current) use of anticoagulants: Secondary | ICD-10-CM | POA: Diagnosis not present

## 2012-11-23 DIAGNOSIS — M19049 Primary osteoarthritis, unspecified hand: Secondary | ICD-10-CM | POA: Diagnosis not present

## 2012-11-23 DIAGNOSIS — M6281 Muscle weakness (generalized): Secondary | ICD-10-CM | POA: Diagnosis not present

## 2012-11-23 DIAGNOSIS — M79609 Pain in unspecified limb: Secondary | ICD-10-CM | POA: Diagnosis not present

## 2012-11-26 DIAGNOSIS — M19049 Primary osteoarthritis, unspecified hand: Secondary | ICD-10-CM | POA: Diagnosis not present

## 2012-11-26 DIAGNOSIS — M79609 Pain in unspecified limb: Secondary | ICD-10-CM | POA: Diagnosis not present

## 2012-11-26 DIAGNOSIS — M6281 Muscle weakness (generalized): Secondary | ICD-10-CM | POA: Diagnosis not present

## 2012-12-16 DIAGNOSIS — I4891 Unspecified atrial fibrillation: Secondary | ICD-10-CM | POA: Diagnosis not present

## 2012-12-16 DIAGNOSIS — Z7901 Long term (current) use of anticoagulants: Secondary | ICD-10-CM | POA: Diagnosis not present

## 2013-01-06 DIAGNOSIS — I4891 Unspecified atrial fibrillation: Secondary | ICD-10-CM | POA: Diagnosis not present

## 2013-01-06 DIAGNOSIS — Z7901 Long term (current) use of anticoagulants: Secondary | ICD-10-CM | POA: Diagnosis not present

## 2013-01-12 DIAGNOSIS — I10 Essential (primary) hypertension: Secondary | ICD-10-CM | POA: Diagnosis not present

## 2013-01-12 DIAGNOSIS — I4891 Unspecified atrial fibrillation: Secondary | ICD-10-CM | POA: Diagnosis not present

## 2013-01-12 DIAGNOSIS — Z7901 Long term (current) use of anticoagulants: Secondary | ICD-10-CM | POA: Diagnosis not present

## 2013-01-12 DIAGNOSIS — Z79899 Other long term (current) drug therapy: Secondary | ICD-10-CM | POA: Diagnosis not present

## 2013-01-20 DIAGNOSIS — I4891 Unspecified atrial fibrillation: Secondary | ICD-10-CM | POA: Diagnosis not present

## 2013-01-20 DIAGNOSIS — Z7901 Long term (current) use of anticoagulants: Secondary | ICD-10-CM | POA: Diagnosis not present

## 2013-02-11 DIAGNOSIS — Z7901 Long term (current) use of anticoagulants: Secondary | ICD-10-CM | POA: Diagnosis not present

## 2013-02-11 DIAGNOSIS — I4891 Unspecified atrial fibrillation: Secondary | ICD-10-CM | POA: Diagnosis not present

## 2013-02-15 DIAGNOSIS — Z79899 Other long term (current) drug therapy: Secondary | ICD-10-CM | POA: Diagnosis not present

## 2013-02-15 DIAGNOSIS — E785 Hyperlipidemia, unspecified: Secondary | ICD-10-CM | POA: Diagnosis not present

## 2013-03-03 DIAGNOSIS — H9210 Otorrhea, unspecified ear: Secondary | ICD-10-CM | POA: Diagnosis not present

## 2013-03-03 DIAGNOSIS — Z7901 Long term (current) use of anticoagulants: Secondary | ICD-10-CM | POA: Diagnosis not present

## 2013-03-03 DIAGNOSIS — H6120 Impacted cerumen, unspecified ear: Secondary | ICD-10-CM | POA: Diagnosis not present

## 2013-03-18 ENCOUNTER — Other Ambulatory Visit: Payer: Self-pay

## 2013-03-18 DIAGNOSIS — I4891 Unspecified atrial fibrillation: Secondary | ICD-10-CM | POA: Diagnosis not present

## 2013-03-18 DIAGNOSIS — C44319 Basal cell carcinoma of skin of other parts of face: Secondary | ICD-10-CM | POA: Diagnosis not present

## 2013-03-18 DIAGNOSIS — D485 Neoplasm of uncertain behavior of skin: Secondary | ICD-10-CM | POA: Diagnosis not present

## 2013-03-18 DIAGNOSIS — D235 Other benign neoplasm of skin of trunk: Secondary | ICD-10-CM | POA: Diagnosis not present

## 2013-03-18 DIAGNOSIS — L821 Other seborrheic keratosis: Secondary | ICD-10-CM | POA: Diagnosis not present

## 2013-03-18 DIAGNOSIS — Z7901 Long term (current) use of anticoagulants: Secondary | ICD-10-CM | POA: Diagnosis not present

## 2013-03-18 DIAGNOSIS — L819 Disorder of pigmentation, unspecified: Secondary | ICD-10-CM | POA: Diagnosis not present

## 2013-04-15 DIAGNOSIS — I4891 Unspecified atrial fibrillation: Secondary | ICD-10-CM | POA: Diagnosis not present

## 2013-04-15 DIAGNOSIS — Z7901 Long term (current) use of anticoagulants: Secondary | ICD-10-CM | POA: Diagnosis not present

## 2013-05-10 DIAGNOSIS — R7309 Other abnormal glucose: Secondary | ICD-10-CM | POA: Diagnosis not present

## 2013-05-10 DIAGNOSIS — Z23 Encounter for immunization: Secondary | ICD-10-CM | POA: Diagnosis not present

## 2013-05-10 DIAGNOSIS — N183 Chronic kidney disease, stage 3 unspecified: Secondary | ICD-10-CM | POA: Diagnosis not present

## 2013-05-10 DIAGNOSIS — R748 Abnormal levels of other serum enzymes: Secondary | ICD-10-CM | POA: Diagnosis not present

## 2013-05-10 DIAGNOSIS — E785 Hyperlipidemia, unspecified: Secondary | ICD-10-CM | POA: Diagnosis not present

## 2013-05-10 DIAGNOSIS — I4891 Unspecified atrial fibrillation: Secondary | ICD-10-CM | POA: Diagnosis not present

## 2013-05-10 DIAGNOSIS — I10 Essential (primary) hypertension: Secondary | ICD-10-CM | POA: Diagnosis not present

## 2013-05-10 DIAGNOSIS — K7689 Other specified diseases of liver: Secondary | ICD-10-CM | POA: Diagnosis not present

## 2013-05-10 DIAGNOSIS — R918 Other nonspecific abnormal finding of lung field: Secondary | ICD-10-CM | POA: Diagnosis not present

## 2013-05-11 ENCOUNTER — Ambulatory Visit (INDEPENDENT_AMBULATORY_CARE_PROVIDER_SITE_OTHER): Payer: Medicare Other | Admitting: Pharmacist

## 2013-05-11 DIAGNOSIS — I4891 Unspecified atrial fibrillation: Secondary | ICD-10-CM

## 2013-05-11 LAB — POCT INR: INR: 2.4

## 2013-05-23 DIAGNOSIS — H43819 Vitreous degeneration, unspecified eye: Secondary | ICD-10-CM | POA: Diagnosis not present

## 2013-06-22 ENCOUNTER — Encounter (INDEPENDENT_AMBULATORY_CARE_PROVIDER_SITE_OTHER): Payer: Self-pay

## 2013-06-22 ENCOUNTER — Ambulatory Visit (INDEPENDENT_AMBULATORY_CARE_PROVIDER_SITE_OTHER): Payer: Medicare Other | Admitting: Pharmacist

## 2013-06-22 DIAGNOSIS — I4891 Unspecified atrial fibrillation: Secondary | ICD-10-CM

## 2013-06-22 LAB — POCT INR: INR: 1.8

## 2013-07-06 ENCOUNTER — Ambulatory Visit (INDEPENDENT_AMBULATORY_CARE_PROVIDER_SITE_OTHER): Payer: Medicare Other | Admitting: *Deleted

## 2013-07-06 DIAGNOSIS — I4891 Unspecified atrial fibrillation: Secondary | ICD-10-CM

## 2013-07-06 LAB — POCT INR: INR: 2.9

## 2013-07-08 ENCOUNTER — Encounter: Payer: Self-pay | Admitting: General Surgery

## 2013-07-08 DIAGNOSIS — Z7901 Long term (current) use of anticoagulants: Secondary | ICD-10-CM | POA: Insufficient documentation

## 2013-07-08 DIAGNOSIS — I4821 Permanent atrial fibrillation: Secondary | ICD-10-CM | POA: Insufficient documentation

## 2013-07-08 DIAGNOSIS — I4891 Unspecified atrial fibrillation: Secondary | ICD-10-CM

## 2013-07-08 DIAGNOSIS — Z79899 Other long term (current) drug therapy: Secondary | ICD-10-CM

## 2013-07-08 DIAGNOSIS — I10 Essential (primary) hypertension: Secondary | ICD-10-CM

## 2013-07-11 ENCOUNTER — Ambulatory Visit (INDEPENDENT_AMBULATORY_CARE_PROVIDER_SITE_OTHER): Payer: Medicare Other | Admitting: Cardiology

## 2013-07-11 ENCOUNTER — Other Ambulatory Visit: Payer: Self-pay | Admitting: *Deleted

## 2013-07-11 ENCOUNTER — Encounter: Payer: Self-pay | Admitting: Cardiology

## 2013-07-11 VITALS — BP 116/68 | HR 78 | Ht 66.0 in | Wt 142.0 lb

## 2013-07-11 DIAGNOSIS — Z7901 Long term (current) use of anticoagulants: Secondary | ICD-10-CM

## 2013-07-11 DIAGNOSIS — I10 Essential (primary) hypertension: Secondary | ICD-10-CM

## 2013-07-11 DIAGNOSIS — E785 Hyperlipidemia, unspecified: Secondary | ICD-10-CM | POA: Insufficient documentation

## 2013-07-11 DIAGNOSIS — I4891 Unspecified atrial fibrillation: Secondary | ICD-10-CM | POA: Diagnosis not present

## 2013-07-11 LAB — BASIC METABOLIC PANEL
BUN: 45 mg/dL — ABNORMAL HIGH (ref 6–23)
CO2: 26 mEq/L (ref 19–32)
Calcium: 10.7 mg/dL — ABNORMAL HIGH (ref 8.4–10.5)
Chloride: 107 mEq/L (ref 96–112)
Creatinine, Ser: 1.6 mg/dL — ABNORMAL HIGH (ref 0.4–1.2)
GFR: 34.35 mL/min — ABNORMAL LOW (ref >60.00)
Glucose, Bld: 115 mg/dL — ABNORMAL HIGH (ref 70–99)
Potassium: 4.5 mEq/L (ref 3.5–5.1)
Sodium: 141 mEq/L (ref 135–145)

## 2013-07-11 NOTE — Progress Notes (Signed)
[address] [place], [place]  [address] Phone: [phone] Fax:  [phone]  Date:  07/11/2013   ID:  Mallory Stephens, DOB 1932-09-23, MRN [id number]  PCP:  Emeterio Reeve, MD  Cardiologist:  Armanda Magic, MD     History of Present Illness: Mallory Stephens is a 79 y.o. female with a history of chronic atrial fibrillation and HTN who presents today for followup.  She is doing well.  She denies any chest pain, SOB, DOE,dizziness, palpitations or syncope. She has mild LE edema during the day which is gone overnight.   Wt Readings from Last 3 Encounters:  07/11/13 142 lb (64.411 kg)  07/08/13 157 lb 3.2 oz (71.305 kg)  09/02/11 152 lb (68.947 kg)     Past Medical History  Diagnosis Date  . Dyslipidemia   . Osteoarthritis   . PUD (peptic ulcer disease)   . GERD (gastroesophageal reflux disease)   . Multinodular goiter   . Pulmonary nodules     followed  by Dr. Paulino Rily  . Fatty liver   . Lymphocytosis     followed by Dr. Arbutus Ped  . PONV (postoperative nausea and vomiting)   . Hypertension   . Chronic atrial fibrillation     atrial fibrillation s/p DCCV 3/10 and 6/10    Current Outpatient Prescriptions  Medication Sig Dispense Refill  . amLODipine (NORVASC) 5 MG tablet Take 10 mg by mouth daily.       Marland Kitchen aspirin EC 81 MG tablet Take 81 mg by mouth daily.       . celecoxib (CELEBREX) 200 MG capsule Take 200 mg by mouth daily.       . cholecalciferol (VITAMIN D) 1000 UNITS tablet Take 1,000 Units by mouth daily as needed. Patient takes occasionally.      Marland Kitchen estrogens, conjugated, (PREMARIN) 0.625 MG tablet Take 0.625 mg by mouth daily as needed. For hormone replacement.      Marland Kitchen losartan-hydrochlorothiazide (HYZAAR) 100-12.5 MG per tablet Take 1 tablet by mouth daily.       . Multiple Vitamins-Minerals (MULTIVITAMINS THER. W/MINERALS) TABS Take 1 tablet by mouth daily.       Marland Kitchen omega-3 acid ethyl esters (LOVAZA) 1 G capsule Take 1 g by mouth 2 (two) times daily.        Marland Kitchen PARoxetine (PAXIL) 10 MG tablet Take 10 mg by mouth every morning.       . potassium chloride (KLOR-CON) 20 MEQ packet Take 20 mEq by mouth once.      . pravastatin (PRAVACHOL) 20 MG tablet Take 20 mg by mouth daily.        . ranitidine (ZANTAC) 300 MG tablet Take 300 mg by mouth at bedtime.       . tolterodine (DETROL) 2 MG tablet Take 2 mg by mouth 2 (two) times daily.      Marland Kitchen warfarin (COUMADIN) 5 MG tablet Take 5-7.5 mg by mouth daily. Takes 7.5 mg (1.5 tablets) only on tuesdays and thursdays all other days take 5 mg (1 tablet)      . metoprolol succinate (TOPROL XL) 25 MG 24 hr tablet Take 1 tablet (25 mg total) by mouth daily.  30 tablet  11   No current facility-administered medications for this visit.    Allergies:    Allergies  Allergen Reactions  . Sulfa Drugs Cross Reactors Rash    Social History:  The patient  reports that she quit smoking about 24 years ago. She does  not have any smokeless tobacco history on file. She reports that she drinks alcohol. She reports that she does not use illicit drugs.   Family History:  The patient's family history is not on file.   ROS:  Please see the history of present illness.      All other systems reviewed and negative.   PHYSICAL EXAM: VS:  BP 116/68  Pulse 78  Ht 5\' 6"  (1.676 m)  Wt 142 lb (64.411 kg)  BMI 22.93 kg/m2 Well nourished, well developed, in no acute distress HEENT: normal Neck: no JVD Cardiac:  normal S1, S2; RRR; no murmur Lungs:  clear to auscultation bilaterally, no wheezing, rhonchi or rales Abd: soft, nontender, no hepatomegaly Ext: no edema Skin: warm and dry Neuro:  CNs 2-12 intact, no focal abnormalities noted       ASSESSMENT AND PLAN:  1. Chronic atrial fibrillation rate controlled  - continue Warfarin/metoprolol  - stop ASA 2. HTN - controlled  - continue metoprolol/amlodipine/Hyzaar  - check BMET  Followup with me in 6 months  Signed, Armanda Magic, MD 07/11/2013 11:02 AM

## 2013-07-11 NOTE — Patient Instructions (Signed)
Your physician has recommended you make the following change in your medication: Stop 81 MG Aspirin   Your physician recommends that you go to the lab today for a BMET  Your physician wants you to follow-up in: 6 Months with Dr Sherlyn Lick will receive a reminder letter in the mail two months in advance. If you don't receive a letter, please call our office to schedule the follow-up appointment.

## 2013-07-12 ENCOUNTER — Telehealth: Payer: Self-pay | Admitting: *Deleted

## 2013-07-12 DIAGNOSIS — I10 Essential (primary) hypertension: Secondary | ICD-10-CM

## 2013-07-12 MED ORDER — LOSARTAN POTASSIUM 100 MG PO TABS: 100.0000 mg | ORAL_TABLET | Freq: Every day | ORAL | Status: DC

## 2013-07-12 NOTE — Telephone Encounter (Signed)
Informed patient of lab results. Informed her to stop hyzaar and start losartan 100mg  daily and that she needs repeat blood work in one week. Will send script to [facility] on [address] per patient.

## 2013-07-19 ENCOUNTER — Other Ambulatory Visit (INDEPENDENT_AMBULATORY_CARE_PROVIDER_SITE_OTHER): Payer: Medicare Other

## 2013-07-19 DIAGNOSIS — I10 Essential (primary) hypertension: Secondary | ICD-10-CM | POA: Diagnosis not present

## 2013-07-19 LAB — BASIC METABOLIC PANEL
BUN: 33 mg/dL — ABNORMAL HIGH (ref 6–23)
CO2: 25 mEq/L (ref 19–32)
Calcium: 9.7 mg/dL (ref 8.4–10.5)
Chloride: 104 mEq/L (ref 96–112)
Creatinine, Ser: 1.2 mg/dL (ref 0.4–1.2)
GFR: 45.28 mL/min — ABNORMAL LOW (ref >60.00)
Glucose, Bld: 101 mg/dL — ABNORMAL HIGH (ref 70–99)
Potassium: 4.7 mEq/L (ref 3.5–5.1)
Sodium: 136 mEq/L (ref 135–145)

## 2013-07-20 ENCOUNTER — Encounter: Payer: Self-pay | Admitting: General Surgery

## 2013-07-20 DIAGNOSIS — I781 Nevus, non-neoplastic: Secondary | ICD-10-CM | POA: Diagnosis not present

## 2013-07-20 DIAGNOSIS — Z85828 Personal history of other malignant neoplasm of skin: Secondary | ICD-10-CM | POA: Diagnosis not present

## 2013-07-22 ENCOUNTER — Ambulatory Visit (INDEPENDENT_AMBULATORY_CARE_PROVIDER_SITE_OTHER): Payer: Medicare Other

## 2013-07-22 DIAGNOSIS — I4891 Unspecified atrial fibrillation: Secondary | ICD-10-CM | POA: Diagnosis not present

## 2013-07-22 LAB — POCT INR: INR: 3.1

## 2013-08-12 ENCOUNTER — Ambulatory Visit (INDEPENDENT_AMBULATORY_CARE_PROVIDER_SITE_OTHER): Payer: Medicare Other | Admitting: *Deleted

## 2013-08-12 DIAGNOSIS — I4891 Unspecified atrial fibrillation: Secondary | ICD-10-CM | POA: Diagnosis not present

## 2013-08-12 LAB — POCT INR: INR: 2.6

## 2013-08-17 DIAGNOSIS — N318 Other neuromuscular dysfunction of bladder: Secondary | ICD-10-CM | POA: Diagnosis not present

## 2013-08-17 DIAGNOSIS — N390 Urinary tract infection, site not specified: Secondary | ICD-10-CM | POA: Diagnosis not present

## 2013-08-24 DIAGNOSIS — Z1231 Encounter for screening mammogram for malignant neoplasm of breast: Secondary | ICD-10-CM | POA: Diagnosis not present

## 2013-09-09 ENCOUNTER — Ambulatory Visit (INDEPENDENT_AMBULATORY_CARE_PROVIDER_SITE_OTHER): Payer: Medicare Other | Admitting: Pharmacist

## 2013-09-09 DIAGNOSIS — I4891 Unspecified atrial fibrillation: Secondary | ICD-10-CM

## 2013-09-09 LAB — POCT INR: INR: 2.6

## 2013-09-13 DIAGNOSIS — M79609 Pain in unspecified limb: Secondary | ICD-10-CM | POA: Diagnosis not present

## 2013-10-03 DIAGNOSIS — I8310 Varicose veins of unspecified lower extremity with inflammation: Secondary | ICD-10-CM | POA: Diagnosis not present

## 2013-10-07 ENCOUNTER — Ambulatory Visit (INDEPENDENT_AMBULATORY_CARE_PROVIDER_SITE_OTHER): Payer: Medicare Other | Admitting: Pharmacist

## 2013-10-07 DIAGNOSIS — I4891 Unspecified atrial fibrillation: Secondary | ICD-10-CM

## 2013-10-07 LAB — POCT INR: INR: 3

## 2013-11-02 DIAGNOSIS — I959 Hypotension, unspecified: Secondary | ICD-10-CM | POA: Diagnosis not present

## 2013-11-02 DIAGNOSIS — M1710 Unilateral primary osteoarthritis, unspecified knee: Secondary | ICD-10-CM | POA: Diagnosis not present

## 2013-11-02 DIAGNOSIS — IMO0002 Reserved for concepts with insufficient information to code with codable children: Secondary | ICD-10-CM | POA: Diagnosis not present

## 2013-11-02 DIAGNOSIS — N318 Other neuromuscular dysfunction of bladder: Secondary | ICD-10-CM | POA: Diagnosis not present

## 2013-11-02 DIAGNOSIS — K219 Gastro-esophageal reflux disease without esophagitis: Secondary | ICD-10-CM | POA: Diagnosis not present

## 2013-11-02 DIAGNOSIS — R269 Unspecified abnormalities of gait and mobility: Secondary | ICD-10-CM | POA: Diagnosis not present

## 2013-11-04 ENCOUNTER — Ambulatory Visit (INDEPENDENT_AMBULATORY_CARE_PROVIDER_SITE_OTHER): Payer: Medicare Other | Admitting: Pharmacist

## 2013-11-04 DIAGNOSIS — I4891 Unspecified atrial fibrillation: Secondary | ICD-10-CM

## 2013-11-04 LAB — POCT INR: INR: 1.7

## 2013-11-11 DIAGNOSIS — R269 Unspecified abnormalities of gait and mobility: Secondary | ICD-10-CM | POA: Diagnosis not present

## 2013-11-11 DIAGNOSIS — R5383 Other fatigue: Secondary | ICD-10-CM | POA: Diagnosis not present

## 2013-11-11 DIAGNOSIS — R5381 Other malaise: Secondary | ICD-10-CM | POA: Diagnosis not present

## 2013-11-14 DIAGNOSIS — R269 Unspecified abnormalities of gait and mobility: Secondary | ICD-10-CM | POA: Diagnosis not present

## 2013-11-14 DIAGNOSIS — R5383 Other fatigue: Secondary | ICD-10-CM | POA: Diagnosis not present

## 2013-11-14 DIAGNOSIS — R5381 Other malaise: Secondary | ICD-10-CM | POA: Diagnosis not present

## 2013-11-17 DIAGNOSIS — R5383 Other fatigue: Secondary | ICD-10-CM | POA: Diagnosis not present

## 2013-11-17 DIAGNOSIS — R269 Unspecified abnormalities of gait and mobility: Secondary | ICD-10-CM | POA: Diagnosis not present

## 2013-11-17 DIAGNOSIS — R5381 Other malaise: Secondary | ICD-10-CM | POA: Diagnosis not present

## 2013-11-18 ENCOUNTER — Ambulatory Visit (INDEPENDENT_AMBULATORY_CARE_PROVIDER_SITE_OTHER): Payer: Medicare Other | Admitting: Pharmacist

## 2013-11-18 DIAGNOSIS — I4891 Unspecified atrial fibrillation: Secondary | ICD-10-CM | POA: Diagnosis not present

## 2013-11-18 LAB — POCT INR: INR: 2.3

## 2013-11-22 DIAGNOSIS — R269 Unspecified abnormalities of gait and mobility: Secondary | ICD-10-CM | POA: Diagnosis not present

## 2013-11-22 DIAGNOSIS — R5383 Other fatigue: Secondary | ICD-10-CM | POA: Diagnosis not present

## 2013-11-22 DIAGNOSIS — R5381 Other malaise: Secondary | ICD-10-CM | POA: Diagnosis not present

## 2013-11-24 DIAGNOSIS — R5381 Other malaise: Secondary | ICD-10-CM | POA: Diagnosis not present

## 2013-11-24 DIAGNOSIS — R269 Unspecified abnormalities of gait and mobility: Secondary | ICD-10-CM | POA: Diagnosis not present

## 2013-11-24 DIAGNOSIS — R5383 Other fatigue: Secondary | ICD-10-CM | POA: Diagnosis not present

## 2013-11-29 DIAGNOSIS — R269 Unspecified abnormalities of gait and mobility: Secondary | ICD-10-CM | POA: Diagnosis not present

## 2013-11-29 DIAGNOSIS — R5383 Other fatigue: Secondary | ICD-10-CM | POA: Diagnosis not present

## 2013-11-29 DIAGNOSIS — R5381 Other malaise: Secondary | ICD-10-CM | POA: Diagnosis not present

## 2013-12-01 DIAGNOSIS — R269 Unspecified abnormalities of gait and mobility: Secondary | ICD-10-CM | POA: Diagnosis not present

## 2013-12-01 DIAGNOSIS — R5383 Other fatigue: Secondary | ICD-10-CM | POA: Diagnosis not present

## 2013-12-01 DIAGNOSIS — R5381 Other malaise: Secondary | ICD-10-CM | POA: Diagnosis not present

## 2013-12-06 DIAGNOSIS — R5383 Other fatigue: Secondary | ICD-10-CM | POA: Diagnosis not present

## 2013-12-06 DIAGNOSIS — R269 Unspecified abnormalities of gait and mobility: Secondary | ICD-10-CM | POA: Diagnosis not present

## 2013-12-06 DIAGNOSIS — R5381 Other malaise: Secondary | ICD-10-CM | POA: Diagnosis not present

## 2013-12-08 DIAGNOSIS — R269 Unspecified abnormalities of gait and mobility: Secondary | ICD-10-CM | POA: Diagnosis not present

## 2013-12-08 DIAGNOSIS — R5383 Other fatigue: Secondary | ICD-10-CM | POA: Diagnosis not present

## 2013-12-08 DIAGNOSIS — R5381 Other malaise: Secondary | ICD-10-CM | POA: Diagnosis not present

## 2013-12-12 DIAGNOSIS — R5381 Other malaise: Secondary | ICD-10-CM | POA: Diagnosis not present

## 2013-12-12 DIAGNOSIS — R5383 Other fatigue: Secondary | ICD-10-CM | POA: Diagnosis not present

## 2013-12-12 DIAGNOSIS — R269 Unspecified abnormalities of gait and mobility: Secondary | ICD-10-CM | POA: Diagnosis not present

## 2013-12-15 DIAGNOSIS — R269 Unspecified abnormalities of gait and mobility: Secondary | ICD-10-CM | POA: Diagnosis not present

## 2013-12-15 DIAGNOSIS — R5383 Other fatigue: Secondary | ICD-10-CM | POA: Diagnosis not present

## 2013-12-15 DIAGNOSIS — R5381 Other malaise: Secondary | ICD-10-CM | POA: Diagnosis not present

## 2013-12-16 ENCOUNTER — Ambulatory Visit (INDEPENDENT_AMBULATORY_CARE_PROVIDER_SITE_OTHER): Payer: Medicare Other | Admitting: Pharmacist

## 2013-12-16 DIAGNOSIS — I4891 Unspecified atrial fibrillation: Secondary | ICD-10-CM | POA: Diagnosis not present

## 2013-12-16 LAB — POCT INR: INR: 2.7

## 2013-12-19 DIAGNOSIS — R269 Unspecified abnormalities of gait and mobility: Secondary | ICD-10-CM | POA: Diagnosis not present

## 2013-12-19 DIAGNOSIS — R5381 Other malaise: Secondary | ICD-10-CM | POA: Diagnosis not present

## 2013-12-22 DIAGNOSIS — R269 Unspecified abnormalities of gait and mobility: Secondary | ICD-10-CM | POA: Diagnosis not present

## 2013-12-22 DIAGNOSIS — R5383 Other fatigue: Secondary | ICD-10-CM | POA: Diagnosis not present

## 2013-12-22 DIAGNOSIS — R5381 Other malaise: Secondary | ICD-10-CM | POA: Diagnosis not present

## 2013-12-27 DIAGNOSIS — R269 Unspecified abnormalities of gait and mobility: Secondary | ICD-10-CM | POA: Diagnosis not present

## 2013-12-27 DIAGNOSIS — R5381 Other malaise: Secondary | ICD-10-CM | POA: Diagnosis not present

## 2013-12-27 DIAGNOSIS — R5383 Other fatigue: Secondary | ICD-10-CM | POA: Diagnosis not present

## 2014-01-02 DIAGNOSIS — R269 Unspecified abnormalities of gait and mobility: Secondary | ICD-10-CM | POA: Diagnosis not present

## 2014-01-02 DIAGNOSIS — R5381 Other malaise: Secondary | ICD-10-CM | POA: Diagnosis not present

## 2014-01-05 ENCOUNTER — Other Ambulatory Visit: Payer: Self-pay | Admitting: Cardiology

## 2014-01-13 ENCOUNTER — Encounter: Payer: Self-pay | Admitting: Cardiology

## 2014-01-13 ENCOUNTER — Ambulatory Visit (INDEPENDENT_AMBULATORY_CARE_PROVIDER_SITE_OTHER): Payer: Medicare Other | Admitting: Cardiology

## 2014-01-13 ENCOUNTER — Ambulatory Visit (INDEPENDENT_AMBULATORY_CARE_PROVIDER_SITE_OTHER): Payer: Medicare Other | Admitting: *Deleted

## 2014-01-13 VITALS — BP 110/60 | HR 70 | Ht 66.0 in | Wt 149.0 lb

## 2014-01-13 DIAGNOSIS — I4891 Unspecified atrial fibrillation: Secondary | ICD-10-CM

## 2014-01-13 DIAGNOSIS — I10 Essential (primary) hypertension: Secondary | ICD-10-CM | POA: Diagnosis not present

## 2014-01-13 LAB — POCT INR: INR: 2.7

## 2014-01-13 MED ORDER — WARFARIN SODIUM 5 MG PO TABS: ORAL_TABLET | ORAL | Status: DC

## 2014-01-13 NOTE — Progress Notes (Signed)
[place], [place],   [address] Phone: [phone] Fax:  [phone]  Date:  01/13/2014   ID:  Mallory Stephens, DOB [birth year], MRN [id number]  PCP:  Lilian Coma, MD  Cardiologist:  Fransico Him, MD     History of Present Illness: Mallory Stephens is a 80 y.o. female with a history of chronic atrial fibrillation and HTN who presents today for followup. She is doing well. She denies any chest pain, SOB, DOE, LE edema, palpitations or syncope.  She occasionally will have a dizzy episode when going from sitting to standing.    Wt Readings from Last 3 Encounters:  01/13/14 149 lb (67.586 kg)  07/11/13 142 lb (64.411 kg)  07/08/13 157 lb 3.2 oz (71.305 kg)     Past Medical History  Diagnosis Date  . Dyslipidemia   . Osteoarthritis   . PUD (peptic ulcer disease)   . GERD (gastroesophageal reflux disease)   . Multinodular goiter   . Pulmonary nodules     followed  by Dr. Stephanie Acre  . Fatty liver   . Lymphocytosis     followed by Dr. Julien Nordmann  . PONV (postoperative nausea and vomiting)   . Hypertension   . Chronic atrial fibrillation     atrial fibrillation s/p DCCV 3/10 and 6/10    Current Outpatient Prescriptions  Medication Sig Dispense Refill  . amLODipine (NORVASC) 5 MG tablet Take 10 mg by mouth daily.       . celecoxib (CELEBREX) 200 MG capsule Take 200 mg by mouth daily.       . cholecalciferol (VITAMIN D) 1000 UNITS tablet Take 1,000 Units by mouth daily as needed. Patient takes occasionally.      Marland Kitchen losartan (COZAAR) 100 MG tablet Take 1 tablet (100 mg total) by mouth daily.  90 tablet  3  . Multiple Vitamins-Minerals (MULTIVITAMINS THER. W/MINERALS) TABS Take 1 tablet by mouth daily.       Marland Kitchen omega-3 acid ethyl esters (LOVAZA) 1 G capsule Take 1 g by mouth 2 (two) times daily.       Marland Kitchen PARoxetine (PAXIL) 10 MG tablet Take 10 mg by mouth every morning.       . pravastatin (PRAVACHOL) 20 MG tablet Take 20 mg by mouth daily.        . ranitidine  (ZANTAC) 300 MG tablet Take 300 mg by mouth at bedtime.       . tolterodine (DETROL) 2 MG tablet Take 2 mg by mouth 2 (two) times daily.      Marland Kitchen warfarin (COUMADIN) 5 MG tablet Take as directed by [facility]  90 tablet  1  . metoprolol succinate (TOPROL XL) 25 MG 24 hr tablet Take 1 tablet (25 mg total) by mouth daily.  30 tablet  11   No current facility-administered medications for this visit.    Allergies:    Allergies  Allergen Reactions  . Sulfa Drugs Cross Reactors Rash    Social History:  The patient  reports that she quit smoking about 24 years ago. She does not have any smokeless tobacco history on file. She reports that she drinks alcohol. She reports that she does not use illicit drugs.   Family History:  The patient's family history is not on file.   ROS:  Please see the history of present illness.      All other systems reviewed and negative.   PHYSICAL EXAM: VS:  BP 110/60  Pulse 70  Ht  5\' 6"  (1.676 m)  Wt 149 lb (67.586 kg)  BMI 24.06 kg/m2 Well nourished, well developed, in no acute distress HEENT: normal Neck: no JVD Cardiac:  normal S1, S2; irregularly irregular; no murmur Lungs:  clear to auscultation bilaterally, no wheezing, rhonchi or rales Abd: soft, nontender, no hepatomegaly Ext: no edema Skin: warm and dry Neuro:  CNs 2-12 intact, no focal abnormalities noted  ASSESSMENT AND PLAN:  1. Chronic atrial fibrillation rate controlled - continue Warfarin/metoprolol  2. HTN - controlled - continue metoprolol/amlodipine/Hyzaar   Followup with me in 6 months       Signed, Fransico Him, MD 01/13/2014 11:35 AM

## 2014-01-13 NOTE — Patient Instructions (Signed)
Your physician recommends that you continue on your current medications as directed. Please refer to the Current Medication list given to you today.  Your physician wants you to follow-up in: 6 month ov You will receive a reminder letter in the mail two months in advance. If you don't receive a letter, please call our office to schedule the follow-up appointment.  

## 2014-01-18 DIAGNOSIS — L570 Actinic keratosis: Secondary | ICD-10-CM | POA: Diagnosis not present

## 2014-01-18 DIAGNOSIS — Z85828 Personal history of other malignant neoplasm of skin: Secondary | ICD-10-CM | POA: Diagnosis not present

## 2014-01-19 ENCOUNTER — Telehealth: Payer: Self-pay | Admitting: Cardiology

## 2014-01-19 NOTE — Telephone Encounter (Signed)
Spoke to pt and she was made aware we are waiting on a signature from Dr Radford Pax due to the brand change with her warfarin. This Rx is on Dr Landis Gandy cart to be signed for pt. Will fax over as soon as she signs and will call the pt to make her aware that it was sent.   TO Dr Radford Pax as a Juluis Rainier

## 2014-01-19 NOTE — Telephone Encounter (Signed)
New message     Talk to a nurse about a presc that is "all messed up".

## 2014-01-23 NOTE — Telephone Encounter (Signed)
Handled today per [employer].

## 2014-02-10 ENCOUNTER — Ambulatory Visit (INDEPENDENT_AMBULATORY_CARE_PROVIDER_SITE_OTHER): Payer: Medicare Other

## 2014-02-10 DIAGNOSIS — I4891 Unspecified atrial fibrillation: Secondary | ICD-10-CM

## 2014-02-10 LAB — POCT INR: INR: 2.9

## 2014-02-16 ENCOUNTER — Ambulatory Visit (INDEPENDENT_AMBULATORY_CARE_PROVIDER_SITE_OTHER): Payer: Medicare Other | Admitting: *Deleted

## 2014-02-16 DIAGNOSIS — E785 Hyperlipidemia, unspecified: Secondary | ICD-10-CM

## 2014-02-16 LAB — LIPID PANEL
Cholesterol: 150 mg/dL (ref 0–200)
HDL: 45.2 mg/dL (ref >39.00)
LDL Cholesterol: 82 mg/dL (ref 0–99)
NonHDL: 104.8
Total CHOL/HDL Ratio: 3
Triglycerides: 112 mg/dL (ref 0.0–149.0)
VLDL: 22.4 mg/dL (ref 0.0–40.0)

## 2014-02-16 LAB — ALT: ALT: 19 U/L (ref 0–35)

## 2014-02-17 ENCOUNTER — Encounter: Payer: Self-pay | Admitting: General Surgery

## 2014-03-10 ENCOUNTER — Ambulatory Visit (INDEPENDENT_AMBULATORY_CARE_PROVIDER_SITE_OTHER): Payer: Medicare Other | Admitting: Pharmacist

## 2014-03-10 DIAGNOSIS — I4891 Unspecified atrial fibrillation: Secondary | ICD-10-CM

## 2014-03-10 LAB — POCT INR: INR: 2.6

## 2014-04-12 DIAGNOSIS — E785 Hyperlipidemia, unspecified: Secondary | ICD-10-CM | POA: Diagnosis not present

## 2014-04-12 DIAGNOSIS — Z23 Encounter for immunization: Secondary | ICD-10-CM | POA: Diagnosis not present

## 2014-04-12 DIAGNOSIS — E042 Nontoxic multinodular goiter: Secondary | ICD-10-CM | POA: Diagnosis not present

## 2014-04-12 DIAGNOSIS — Z0000 Encounter for general adult medical examination without abnormal findings: Secondary | ICD-10-CM | POA: Diagnosis not present

## 2014-04-12 DIAGNOSIS — Z79899 Other long term (current) drug therapy: Secondary | ICD-10-CM | POA: Diagnosis not present

## 2014-04-12 DIAGNOSIS — M949 Disorder of cartilage, unspecified: Secondary | ICD-10-CM | POA: Diagnosis not present

## 2014-04-12 DIAGNOSIS — M899 Disorder of bone, unspecified: Secondary | ICD-10-CM | POA: Diagnosis not present

## 2014-04-20 ENCOUNTER — Ambulatory Visit (INDEPENDENT_AMBULATORY_CARE_PROVIDER_SITE_OTHER): Payer: Medicare Other | Admitting: Pharmacist

## 2014-04-20 DIAGNOSIS — J069 Acute upper respiratory infection, unspecified: Secondary | ICD-10-CM | POA: Diagnosis not present

## 2014-04-20 DIAGNOSIS — Z7901 Long term (current) use of anticoagulants: Secondary | ICD-10-CM | POA: Diagnosis not present

## 2014-04-20 LAB — POCT INR: INR: 2.8

## 2014-04-27 ENCOUNTER — Other Ambulatory Visit: Payer: Self-pay | Admitting: Family Medicine

## 2014-04-27 ENCOUNTER — Ambulatory Visit
Admission: RE | Admit: 2014-04-27 | Discharge: 2014-04-27 | Disposition: A | Discharge status: Home / Self Care | Payer: Medicare Other | Source: Ambulatory Visit | Attending: Family Medicine | Admitting: Family Medicine

## 2014-04-27 DIAGNOSIS — R05 Cough: Secondary | ICD-10-CM | POA: Diagnosis not present

## 2014-04-27 DIAGNOSIS — R059 Cough, unspecified: Secondary | ICD-10-CM

## 2014-04-27 DIAGNOSIS — R509 Fever, unspecified: Secondary | ICD-10-CM | POA: Diagnosis not present

## 2014-04-27 DIAGNOSIS — R6889 Other general symptoms and signs: Secondary | ICD-10-CM | POA: Diagnosis not present

## 2014-05-01 DIAGNOSIS — Z79899 Other long term (current) drug therapy: Secondary | ICD-10-CM | POA: Diagnosis not present

## 2014-05-01 DIAGNOSIS — M899 Disorder of bone, unspecified: Secondary | ICD-10-CM | POA: Diagnosis not present

## 2014-05-02 ENCOUNTER — Encounter: Payer: Self-pay | Admitting: Cardiology

## 2014-05-03 ENCOUNTER — Ambulatory Visit (INDEPENDENT_AMBULATORY_CARE_PROVIDER_SITE_OTHER): Payer: Medicare Other | Admitting: *Deleted

## 2014-05-03 DIAGNOSIS — I4891 Unspecified atrial fibrillation: Secondary | ICD-10-CM

## 2014-05-03 LAB — POCT INR: INR: 1.9

## 2014-05-11 ENCOUNTER — Encounter: Payer: Self-pay | Admitting: Cardiology

## 2014-05-11 DIAGNOSIS — E2839 Other primary ovarian failure: Secondary | ICD-10-CM | POA: Diagnosis not present

## 2014-05-11 DIAGNOSIS — M8580 Other specified disorders of bone density and structure, unspecified site: Secondary | ICD-10-CM | POA: Diagnosis not present

## 2014-05-17 DIAGNOSIS — L814 Other melanin hyperpigmentation: Secondary | ICD-10-CM | POA: Diagnosis not present

## 2014-05-17 DIAGNOSIS — D225 Melanocytic nevi of trunk: Secondary | ICD-10-CM | POA: Diagnosis not present

## 2014-05-17 DIAGNOSIS — L821 Other seborrheic keratosis: Secondary | ICD-10-CM | POA: Diagnosis not present

## 2014-05-31 ENCOUNTER — Ambulatory Visit (INDEPENDENT_AMBULATORY_CARE_PROVIDER_SITE_OTHER): Payer: Medicare Other | Admitting: *Deleted

## 2014-05-31 DIAGNOSIS — I4891 Unspecified atrial fibrillation: Secondary | ICD-10-CM

## 2014-05-31 LAB — POCT INR: INR: 2.7

## 2014-07-13 ENCOUNTER — Encounter: Payer: Self-pay | Admitting: Cardiology

## 2014-07-13 ENCOUNTER — Ambulatory Visit (INDEPENDENT_AMBULATORY_CARE_PROVIDER_SITE_OTHER): Payer: Medicare Other | Admitting: *Deleted

## 2014-07-13 ENCOUNTER — Ambulatory Visit (INDEPENDENT_AMBULATORY_CARE_PROVIDER_SITE_OTHER): Payer: Medicare Other | Admitting: Cardiology

## 2014-07-13 VITALS — BP 114/80 | HR 93 | Ht 66.0 in | Wt 147.0 lb

## 2014-07-13 DIAGNOSIS — I270 Primary pulmonary hypertension: Secondary | ICD-10-CM

## 2014-07-13 DIAGNOSIS — I4891 Unspecified atrial fibrillation: Secondary | ICD-10-CM | POA: Diagnosis not present

## 2014-07-13 DIAGNOSIS — I10 Essential (primary) hypertension: Secondary | ICD-10-CM | POA: Diagnosis not present

## 2014-07-13 DIAGNOSIS — I482 Chronic atrial fibrillation, unspecified: Secondary | ICD-10-CM

## 2014-07-13 DIAGNOSIS — I272 Pulmonary hypertension, unspecified: Secondary | ICD-10-CM | POA: Insufficient documentation

## 2014-07-13 LAB — BASIC METABOLIC PANEL
BUN: 26 mg/dL — ABNORMAL HIGH (ref 6–23)
CO2: 27 mEq/L (ref 19–32)
Calcium: 10.1 mg/dL (ref 8.4–10.5)
Chloride: 102 mEq/L (ref 96–112)
Creatinine, Ser: 1.3 mg/dL — ABNORMAL HIGH (ref 0.4–1.2)
GFR: 41.24 mL/min — ABNORMAL LOW (ref >60.00)
Glucose, Bld: 109 mg/dL — ABNORMAL HIGH (ref 70–99)
Potassium: 4 mEq/L (ref 3.5–5.1)
Sodium: 135 mEq/L (ref 135–145)

## 2014-07-13 LAB — POCT INR: INR: 3.2

## 2014-07-13 NOTE — Patient Instructions (Signed)
Your physician has requested that you have an echocardiogram. Echocardiography is a painless test that uses sound waves to create images of your heart. It provides your doctor with information about the size and shape of your heart and how well your heart's chambers and valves are working. This procedure takes approximately one hour. There are no restrictions for this procedure.  Your physician recommends that you have lab work TODAY (BMET).  Your physician wants you to follow-up in: 6 months with Dr. Turner. You will receive a reminder letter in the mail two months in advance. If you don't receive a letter, please call our office to schedule the follow-up appointment.   

## 2014-07-13 NOTE — Progress Notes (Signed)
[place], [place], [place]  [address] Phone: [phone] Fax:  [phone]  Date:  07/13/2014   ID:  Mallory Stephens, DOB [birth year], MRN [id number]  PCP:  Lilian Coma, MD  Cardiologist:  Fransico Him, MD    History of Present Illness: Mallory Stephens is a 80 y.o. female with a history of chronic atrial fibrillation and HTN who presents today for followup. She is doing well. She denies any chest pain, LE edema, palpitations, dizziness or syncope. She has noticed some mild DOE when going up stairs but does not get SOB with housework.      Wt Readings from Last 3 Encounters:  07/13/14 147 lb (66.679 kg)  01/13/14 149 lb (67.586 kg)  07/11/13 142 lb (64.411 kg)     Past Medical History  Diagnosis Date  . Dyslipidemia   . Osteoarthritis   . PUD (peptic ulcer disease)   . GERD (gastroesophageal reflux disease)   . Multinodular goiter   . Pulmonary nodules     followed  by Dr. Stephanie Acre  . Fatty liver   . Lymphocytosis     followed by Dr. Julien Nordmann  . PONV (postoperative nausea and vomiting)   . Hypertension   . Chronic atrial fibrillation     atrial fibrillation s/p DCCV 3/10 and 6/10  . Pulmonary HTN     mild by echo 2011    Current Outpatient Prescriptions  Medication Sig Dispense Refill  . amLODipine (NORVASC) 5 MG tablet Take 10 mg by mouth daily.     . cholecalciferol (VITAMIN D) 1000 UNITS tablet Take 1,000 Units by mouth daily as needed. Patient takes occasionally.    Marland Kitchen losartan (COZAAR) 100 MG tablet Take 1 tablet (100 mg total) by mouth daily. 90 tablet 3  . metoprolol succinate (TOPROL XL) 25 MG 24 hr tablet Take 1 tablet (25 mg total) by mouth daily. 30 tablet 11  . Multiple Vitamins-Minerals (MULTIVITAMINS THER. W/MINERALS) TABS Take 1 tablet by mouth daily.     Marland Kitchen omega-3 acid ethyl esters (LOVAZA) 1 G capsule Take 1 g by mouth 2 (two) times daily.     Marland Kitchen PARoxetine (PAXIL) 10 MG tablet Take 10 mg by mouth every morning.     . pravastatin  (PRAVACHOL) 20 MG tablet Take 20 mg by mouth daily.      . ranitidine (ZANTAC) 300 MG tablet Take 300 mg by mouth at bedtime.     . tolterodine (DETROL) 2 MG tablet Take 2 mg by mouth 2 (two) times daily.    Marland Kitchen warfarin (COUMADIN) 5 MG tablet Take as directed by [facility] 90 tablet 1   No current facility-administered medications for this visit.    Allergies:    Allergies  Allergen Reactions  . Sulfa Drugs Cross Reactors Rash    Social History:  The patient  reports that she quit smoking about 25 years ago. She does not have any smokeless tobacco history on file. She reports that she drinks alcohol. She reports that she does not use illicit drugs.   Family History:  The patient's family history is not on file.   ROS:  Please see the history of present illness.      All other systems reviewed and negative.   PHYSICAL EXAM: VS:  BP 114/80 mmHg  Pulse 93  Ht 5\' 6"  (1.676 m)  Wt 147 lb (66.679 kg)  BMI 23.74 kg/m2 Well nourished, well developed, in no acute distress HEENT: normal Neck: no  JVD Cardiac:  normal S1, S2; RRR; no murmur Lungs:  clear to auscultation bilaterally, no wheezing, rhonchi or rales Abd: soft, nontender, no hepatomegaly Ext: no edema Skin: warm and dry Neuro:  CNs 2-12 intact, no focal abnormalities noted  EKG:     Atrial fibrillation at 93bpm with no ST changes  ASSESSMENT AND PLAN:  1. Chronic atrial fibrillation rate controlled - continue Warfarin/metoprolol  2. HTN - controlled - continue metoprolol/amlodipine/Cozaar - check BMET       3.  Pulmonary HTN - mild by echo in 2011.  Repeat echo to reassess PA pressure especially given mild SOB when going up strairs  Followup with me in 6 months    Signed, Fransico Him, MD [facility] 07/13/2014 11:27 AM

## 2014-07-14 ENCOUNTER — Other Ambulatory Visit: Payer: Self-pay | Admitting: Cardiology

## 2014-07-17 DIAGNOSIS — M545 Low back pain: Secondary | ICD-10-CM | POA: Diagnosis not present

## 2014-07-18 ENCOUNTER — Other Ambulatory Visit: Payer: Self-pay | Admitting: *Deleted

## 2014-07-18 DIAGNOSIS — I10 Essential (primary) hypertension: Secondary | ICD-10-CM

## 2014-07-18 MED ORDER — LOSARTAN POTASSIUM 100 MG PO TABS: 100.0000 mg | ORAL_TABLET | Freq: Every day | ORAL | Status: DC

## 2014-07-20 ENCOUNTER — Ambulatory Visit (HOSPITAL_COMMUNITY): Payer: Medicare Other | Attending: Family Medicine | Admitting: Radiology

## 2014-07-20 ENCOUNTER — Other Ambulatory Visit: Payer: Self-pay

## 2014-07-20 DIAGNOSIS — E785 Hyperlipidemia, unspecified: Secondary | ICD-10-CM | POA: Insufficient documentation

## 2014-07-20 DIAGNOSIS — I371 Nonrheumatic pulmonary valve insufficiency: Secondary | ICD-10-CM | POA: Insufficient documentation

## 2014-07-20 DIAGNOSIS — I10 Essential (primary) hypertension: Secondary | ICD-10-CM | POA: Diagnosis not present

## 2014-07-20 DIAGNOSIS — Z87891 Personal history of nicotine dependence: Secondary | ICD-10-CM | POA: Insufficient documentation

## 2014-07-20 DIAGNOSIS — I270 Primary pulmonary hypertension: Secondary | ICD-10-CM | POA: Diagnosis not present

## 2014-07-20 DIAGNOSIS — I340 Nonrheumatic mitral (valve) insufficiency: Secondary | ICD-10-CM | POA: Diagnosis not present

## 2014-07-20 DIAGNOSIS — I4891 Unspecified atrial fibrillation: Secondary | ICD-10-CM | POA: Insufficient documentation

## 2014-07-20 DIAGNOSIS — I272 Pulmonary hypertension, unspecified: Secondary | ICD-10-CM

## 2014-07-20 DIAGNOSIS — R0600 Dyspnea, unspecified: Secondary | ICD-10-CM | POA: Diagnosis not present

## 2014-07-20 DIAGNOSIS — I351 Nonrheumatic aortic (valve) insufficiency: Secondary | ICD-10-CM | POA: Diagnosis not present

## 2014-07-20 NOTE — Progress Notes (Signed)
Echocardiogram performed.  

## 2014-07-21 DIAGNOSIS — M545 Low back pain: Secondary | ICD-10-CM | POA: Diagnosis not present

## 2014-08-01 DIAGNOSIS — M545 Low back pain: Secondary | ICD-10-CM | POA: Diagnosis not present

## 2014-08-03 DIAGNOSIS — M545 Low back pain: Secondary | ICD-10-CM | POA: Diagnosis not present

## 2014-08-08 DIAGNOSIS — M545 Low back pain: Secondary | ICD-10-CM | POA: Diagnosis not present

## 2014-08-09 ENCOUNTER — Ambulatory Visit (INDEPENDENT_AMBULATORY_CARE_PROVIDER_SITE_OTHER): Payer: Medicare Other | Admitting: *Deleted

## 2014-08-09 DIAGNOSIS — I4891 Unspecified atrial fibrillation: Secondary | ICD-10-CM

## 2014-08-09 LAB — POCT INR: INR: 2.4

## 2014-08-10 DIAGNOSIS — M545 Low back pain: Secondary | ICD-10-CM | POA: Diagnosis not present

## 2014-08-14 DIAGNOSIS — M545 Low back pain: Secondary | ICD-10-CM | POA: Diagnosis not present

## 2014-08-15 DIAGNOSIS — M545 Low back pain: Secondary | ICD-10-CM | POA: Diagnosis not present

## 2014-08-15 DIAGNOSIS — M25552 Pain in left hip: Secondary | ICD-10-CM | POA: Diagnosis not present

## 2014-08-17 DIAGNOSIS — M545 Low back pain: Secondary | ICD-10-CM | POA: Diagnosis not present

## 2014-08-22 DIAGNOSIS — M545 Low back pain: Secondary | ICD-10-CM | POA: Diagnosis not present

## 2014-08-29 DIAGNOSIS — M545 Low back pain: Secondary | ICD-10-CM | POA: Diagnosis not present

## 2014-08-31 DIAGNOSIS — M545 Low back pain: Secondary | ICD-10-CM | POA: Diagnosis not present

## 2014-09-05 DIAGNOSIS — M545 Low back pain: Secondary | ICD-10-CM | POA: Diagnosis not present

## 2014-09-06 ENCOUNTER — Ambulatory Visit (INDEPENDENT_AMBULATORY_CARE_PROVIDER_SITE_OTHER): Payer: Medicare Other | Admitting: *Deleted

## 2014-09-06 DIAGNOSIS — I4891 Unspecified atrial fibrillation: Secondary | ICD-10-CM

## 2014-09-06 LAB — POCT INR: INR: 2.9

## 2014-09-07 DIAGNOSIS — M545 Low back pain: Secondary | ICD-10-CM | POA: Diagnosis not present

## 2014-09-12 DIAGNOSIS — M545 Low back pain: Secondary | ICD-10-CM | POA: Diagnosis not present

## 2014-09-14 DIAGNOSIS — M545 Low back pain: Secondary | ICD-10-CM | POA: Diagnosis not present

## 2014-09-15 DIAGNOSIS — M25562 Pain in left knee: Secondary | ICD-10-CM | POA: Diagnosis not present

## 2014-09-15 DIAGNOSIS — M25561 Pain in right knee: Secondary | ICD-10-CM | POA: Diagnosis not present

## 2014-09-15 DIAGNOSIS — M6281 Muscle weakness (generalized): Secondary | ICD-10-CM | POA: Diagnosis not present

## 2014-09-19 DIAGNOSIS — M545 Low back pain: Secondary | ICD-10-CM | POA: Diagnosis not present

## 2014-09-22 DIAGNOSIS — M545 Low back pain: Secondary | ICD-10-CM | POA: Diagnosis not present

## 2014-09-26 DIAGNOSIS — M545 Low back pain: Secondary | ICD-10-CM | POA: Diagnosis not present

## 2014-09-28 DIAGNOSIS — M545 Low back pain: Secondary | ICD-10-CM | POA: Diagnosis not present

## 2014-10-03 DIAGNOSIS — M545 Low back pain: Secondary | ICD-10-CM | POA: Diagnosis not present

## 2014-10-04 ENCOUNTER — Ambulatory Visit (INDEPENDENT_AMBULATORY_CARE_PROVIDER_SITE_OTHER): Payer: Medicare Other | Admitting: Pharmacist

## 2014-10-04 DIAGNOSIS — I4891 Unspecified atrial fibrillation: Secondary | ICD-10-CM

## 2014-10-04 LAB — POCT INR: INR: 2.4

## 2014-10-05 DIAGNOSIS — M545 Low back pain: Secondary | ICD-10-CM | POA: Diagnosis not present

## 2014-10-10 DIAGNOSIS — M545 Low back pain: Secondary | ICD-10-CM | POA: Diagnosis not present

## 2014-10-12 DIAGNOSIS — M545 Low back pain: Secondary | ICD-10-CM | POA: Diagnosis not present

## 2014-10-13 ENCOUNTER — Other Ambulatory Visit: Payer: Self-pay | Admitting: Cardiology

## 2014-10-13 ENCOUNTER — Encounter: Payer: Self-pay | Admitting: Cardiology

## 2014-10-13 DIAGNOSIS — M6281 Muscle weakness (generalized): Secondary | ICD-10-CM | POA: Diagnosis not present

## 2014-10-17 DIAGNOSIS — M545 Low back pain: Secondary | ICD-10-CM | POA: Diagnosis not present

## 2014-10-19 DIAGNOSIS — F325 Major depressive disorder, single episode, in full remission: Secondary | ICD-10-CM | POA: Diagnosis not present

## 2014-10-19 DIAGNOSIS — J309 Allergic rhinitis, unspecified: Secondary | ICD-10-CM | POA: Diagnosis not present

## 2014-10-19 DIAGNOSIS — E042 Nontoxic multinodular goiter: Secondary | ICD-10-CM | POA: Diagnosis not present

## 2014-10-19 DIAGNOSIS — E785 Hyperlipidemia, unspecified: Secondary | ICD-10-CM | POA: Diagnosis not present

## 2014-10-19 DIAGNOSIS — I10 Essential (primary) hypertension: Secondary | ICD-10-CM | POA: Diagnosis not present

## 2014-10-19 DIAGNOSIS — M899 Disorder of bone, unspecified: Secondary | ICD-10-CM | POA: Diagnosis not present

## 2014-10-19 DIAGNOSIS — Z79899 Other long term (current) drug therapy: Secondary | ICD-10-CM | POA: Diagnosis not present

## 2014-10-19 DIAGNOSIS — I4891 Unspecified atrial fibrillation: Secondary | ICD-10-CM | POA: Diagnosis not present

## 2014-10-25 ENCOUNTER — Telehealth: Payer: Self-pay | Admitting: Cardiology

## 2014-10-25 NOTE — Telephone Encounter (Signed)
Spoke with pt and made her aware that there is minimal interaction or none with Z-pk, just increase her vitamin K intake(leafy green veggie) today and tomorrow then resume normal intake. Call us back if she get another ABX or any new med.

## 2014-10-25 NOTE — Telephone Encounter (Signed)
New message       Pt is on coumadin and started zpak last night.  Will she need her coumadin adjusted?

## 2014-10-26 DIAGNOSIS — M545 Low back pain: Secondary | ICD-10-CM | POA: Diagnosis not present

## 2014-11-02 DIAGNOSIS — M545 Low back pain: Secondary | ICD-10-CM | POA: Diagnosis not present

## 2014-11-07 DIAGNOSIS — M545 Low back pain: Secondary | ICD-10-CM | POA: Diagnosis not present

## 2014-11-09 DIAGNOSIS — M545 Low back pain: Secondary | ICD-10-CM | POA: Diagnosis not present

## 2014-11-15 ENCOUNTER — Ambulatory Visit (INDEPENDENT_AMBULATORY_CARE_PROVIDER_SITE_OTHER): Payer: Medicare Other | Admitting: *Deleted

## 2014-11-15 DIAGNOSIS — Z5181 Encounter for therapeutic drug level monitoring: Secondary | ICD-10-CM | POA: Diagnosis not present

## 2014-11-15 DIAGNOSIS — I4891 Unspecified atrial fibrillation: Secondary | ICD-10-CM | POA: Diagnosis not present

## 2014-11-15 LAB — POCT INR: INR: 1.6

## 2014-12-11 DIAGNOSIS — M6281 Muscle weakness (generalized): Secondary | ICD-10-CM | POA: Diagnosis not present

## 2014-12-13 ENCOUNTER — Ambulatory Visit (INDEPENDENT_AMBULATORY_CARE_PROVIDER_SITE_OTHER): Payer: Medicare Other | Admitting: Pharmacist

## 2014-12-13 DIAGNOSIS — Z5181 Encounter for therapeutic drug level monitoring: Secondary | ICD-10-CM

## 2014-12-13 DIAGNOSIS — I4891 Unspecified atrial fibrillation: Secondary | ICD-10-CM

## 2014-12-13 LAB — POCT INR: INR: 2.9

## 2015-01-10 ENCOUNTER — Ambulatory Visit (INDEPENDENT_AMBULATORY_CARE_PROVIDER_SITE_OTHER): Payer: Medicare Other | Admitting: *Deleted

## 2015-01-10 DIAGNOSIS — Z5181 Encounter for therapeutic drug level monitoring: Secondary | ICD-10-CM

## 2015-01-10 DIAGNOSIS — I4891 Unspecified atrial fibrillation: Secondary | ICD-10-CM | POA: Diagnosis not present

## 2015-01-10 LAB — POCT INR: INR: 2.7

## 2015-01-18 ENCOUNTER — Other Ambulatory Visit: Payer: Self-pay | Admitting: Cardiology

## 2015-01-18 ENCOUNTER — Ambulatory Visit (INDEPENDENT_AMBULATORY_CARE_PROVIDER_SITE_OTHER): Payer: Medicare Other | Admitting: Podiatry

## 2015-01-18 ENCOUNTER — Ambulatory Visit (INDEPENDENT_AMBULATORY_CARE_PROVIDER_SITE_OTHER): Payer: Medicare Other

## 2015-01-18 ENCOUNTER — Encounter: Payer: Self-pay | Admitting: Podiatry

## 2015-01-18 VITALS — BP 123/83 | HR 91 | Resp 16

## 2015-01-18 DIAGNOSIS — M2041 Other hammer toe(s) (acquired), right foot: Secondary | ICD-10-CM

## 2015-01-18 DIAGNOSIS — L84 Corns and callosities: Secondary | ICD-10-CM

## 2015-01-18 DIAGNOSIS — M79673 Pain in unspecified foot: Secondary | ICD-10-CM | POA: Diagnosis not present

## 2015-01-18 DIAGNOSIS — B351 Tinea unguium: Secondary | ICD-10-CM | POA: Diagnosis not present

## 2015-01-18 NOTE — Progress Notes (Signed)
   Subjective:    Patient ID: Mallory Stephens, female    DOB: Jan 04, 1933, 81 y.o.   MRN: [id number]  HPI Comments: "I have a sore toenail and two toes that bother me"  Patient c/o tender, thick, discolored 1st toenail right for about 2-3 years. She injured this toe initially and now the toe has become sore. Also she needs the other nails cut.  Also, she has corns interdigital 1st and 2nd toes right that rub against each other. She tried to trim down and she keeps the area cushioned with pad and band aid.  Toe Pain       Review of Systems  Genitourinary: Positive for frequency.  Musculoskeletal: Positive for arthralgias.  Neurological: Positive for dizziness.  Hematological: Bruises/bleeds easily.  All other systems reviewed and are negative.      Objective:   Physical Exam: I have reviewed her past medical history medications allergies surgery social history. Pulses are palpable bilateral. Neurologic sensorium is intact for Semmes-Weinstein monofilament. The tendon reflexes are intact bilaterally muscle strength 5 over 5 dorsiflexion plantar flexors and inverters and everters all digits of musculature is intact. Orthopedic evaluation demonstrates all joints distal to the ankle for range of motion without crepitus all hallux valgus deformities are noted bilateral with hammertoe deformities. Cutaneous evaluation demonstrates supple well-hydrated cutis without erythema edema saline as drainage or odor. Multiple areas of hyperkeratosis associated with malalignment digits. She also has thick yellow dystrophic clinically mycotic nails which are painful on palpation as well as debridement.        Assessment & Plan:  Assessment: Pain and limb secondary to onychomycosis with corns and calluses.  Plan: Debridement of all reactive hyperkeratoses and nails 1 through 5 bilaterally covered service. Follow-up with her as needed and dispensed multiple types of habits.

## 2015-01-26 DIAGNOSIS — Z01419 Encounter for gynecological examination (general) (routine) without abnormal findings: Secondary | ICD-10-CM | POA: Diagnosis not present

## 2015-01-26 DIAGNOSIS — Z124 Encounter for screening for malignant neoplasm of cervix: Secondary | ICD-10-CM | POA: Diagnosis not present

## 2015-01-26 DIAGNOSIS — Z1231 Encounter for screening mammogram for malignant neoplasm of breast: Secondary | ICD-10-CM | POA: Diagnosis not present

## 2015-01-26 DIAGNOSIS — N3281 Overactive bladder: Secondary | ICD-10-CM | POA: Diagnosis not present

## 2015-01-31 ENCOUNTER — Ambulatory Visit (INDEPENDENT_AMBULATORY_CARE_PROVIDER_SITE_OTHER): Payer: Medicare Other | Admitting: Cardiology

## 2015-01-31 ENCOUNTER — Ambulatory Visit (INDEPENDENT_AMBULATORY_CARE_PROVIDER_SITE_OTHER): Payer: Medicare Other | Admitting: *Deleted

## 2015-01-31 ENCOUNTER — Encounter: Payer: Self-pay | Admitting: Cardiology

## 2015-01-31 VITALS — BP 100/60 | HR 87 | Ht 66.0 in | Wt 142.4 lb

## 2015-01-31 DIAGNOSIS — I482 Chronic atrial fibrillation, unspecified: Secondary | ICD-10-CM

## 2015-01-31 DIAGNOSIS — I270 Primary pulmonary hypertension: Secondary | ICD-10-CM

## 2015-01-31 DIAGNOSIS — I272 Pulmonary hypertension, unspecified: Secondary | ICD-10-CM

## 2015-01-31 DIAGNOSIS — Z5181 Encounter for therapeutic drug level monitoring: Secondary | ICD-10-CM

## 2015-01-31 DIAGNOSIS — I4891 Unspecified atrial fibrillation: Secondary | ICD-10-CM

## 2015-01-31 DIAGNOSIS — I10 Essential (primary) hypertension: Secondary | ICD-10-CM | POA: Diagnosis not present

## 2015-01-31 LAB — BASIC METABOLIC PANEL
BUN: 42 mg/dL — ABNORMAL HIGH (ref 6–23)
CO2: 28 mEq/L (ref 19–32)
Calcium: 10.1 mg/dL (ref 8.4–10.5)
Chloride: 102 mEq/L (ref 96–112)
Creatinine, Ser: 2.07 mg/dL — ABNORMAL HIGH (ref 0.40–1.20)
GFR: 24.5 mL/min — ABNORMAL LOW (ref >60.00)
Glucose, Bld: 106 mg/dL — ABNORMAL HIGH (ref 70–99)
Potassium: 3.9 mEq/L (ref 3.5–5.1)
Sodium: 140 mEq/L (ref 135–145)

## 2015-01-31 LAB — POCT INR: INR: 3.2

## 2015-01-31 NOTE — Patient Instructions (Signed)

## 2015-01-31 NOTE — Progress Notes (Signed)
Cardiology Office Note   Date:  01/31/2015   ID:  Mallory Stephens, DOB [birth year], MRN [id number]  PCP:  Lilian Coma, MD    Chief Complaint  Patient presents with  . Follow-up    chronic atrial fib      History of Present Illness: Mallory Stephens is a 81 y.o. female with a history of chronic atrial fibrillation and HTN who presents today for followup. She is doing well. She denies any chest pain, palpitations, dizziness or syncope.  She denies any SOB or DOE.  She does tend to have some LE edema in the summer.      Past Medical History  Diagnosis Date  . Dyslipidemia   . Osteoarthritis   . PUD (peptic ulcer disease)   . GERD (gastroesophageal reflux disease)   . Multinodular goiter   . Pulmonary nodules     followed  by Dr. Stephanie Acre  . Fatty liver   . Lymphocytosis     followed by Dr. Julien Nordmann  . PONV (postoperative nausea and vomiting)   . Hypertension   . Chronic atrial fibrillation     atrial fibrillation s/p DCCV 3/10 and 6/10  . Pulmonary HTN     mild by echo 2011    Past Surgical History  Procedure Laterality Date  . Breast surgery  1962    right breast for benign lesion  . Breast surgery  1971    bilateral lumpectomy for beingn lesions  . Cystoscopy    . Aspiration of cyst  1978  . Dilation and curettage of uterus  1979  . Cholecystectomy  2003  . Joint replacement  2002    Right knee  . Joint replacement  2007    left knee  . Cardioversion  07/22/2011    Procedure: CARDIOVERSION;  Surgeon: [name], MD;  Location: [facility];  Service: Cardiovascular;  Laterality: N/A;  . Cardioversion  09/02/2011    Procedure: CARDIOVERSION;  Surgeon: [name], MD;  Location: [facility];  Service: Cardiovascular;  Laterality: N/A;     Current Outpatient Prescriptions  Medication Sig Dispense Refill  . amLODipine (NORVASC) 10 MG tablet     . cholecalciferol (VITAMIN D) 1000 UNITS tablet Take 1,000 Units by mouth daily as needed.  Patient takes occasionally.    Marland Kitchen losartan (COZAAR) 100 MG tablet TAKE 1 TABLET (100 MG TOTAL) BY MOUTH DAILY. 90 tablet 0  . metoprolol succinate (TOPROL XL) 25 MG 24 hr tablet Take 1 tablet (25 mg total) by mouth daily. 30 tablet 11  . Multiple Vitamins-Minerals (MULTIVITAMINS THER. W/MINERALS) TABS Take 1 tablet by mouth daily.     Marland Kitchen omega-3 acid ethyl esters (LOVAZA) 1 G capsule Take 1 g by mouth 2 (two) times daily.     Marland Kitchen PARoxetine (PAXIL) 10 MG tablet Take 10 mg by mouth every morning.     . pravastatin (PRAVACHOL) 20 MG tablet Take 20 mg by mouth daily.      . ranitidine (ZANTAC) 300 MG tablet Take 300 mg by mouth at bedtime.     . tolterodine (DETROL) 2 MG tablet Take 2 mg by mouth 2 (two) times daily.    Marland Kitchen warfarin (COUMADIN) 5 MG tablet TAKE AS DIRECTED BY COUMADIN CLINIC 90 tablet 1   No current facility-administered medications for this visit.    Allergies:   Sulfa drugs cross reactors    Social  History:  The patient  reports that she quit smoking about 25 years ago. She does not have any smokeless tobacco history on file. She reports that she drinks alcohol. She reports that she does not use illicit drugs.   Family History:  The patient's family history includes Arthritis in her sister; Breast cancer in her sister; Heart disease in her brother; Prostate cancer in her brother and brother; Scoliosis in her sister; Stroke in her father and mother.    ROS:  Please see the history of present illness.   Otherwise, review of systems are positive for none.   All other systems are reviewed and negative.    PHYSICAL EXAM: VS:  BP 100/60 mmHg  Pulse 87  Ht 5\' 6"  (1.676 m)  Wt 142 lb 6.4 oz (64.592 kg)  BMI 22.99 kg/m2  SpO2 91% , BMI Body mass index is 22.99 kg/(m^2). GEN: Well nourished, well developed, in no acute distress HEENT: normal Neck: no JVD, carotid bruits, or masses Cardiac: RRR; no murmurs, rubs, or gallops,no edema  Respiratory:  clear to auscultation bilaterally,  normal work of breathing GI: soft, nontender, nondistended, + BS MS: no deformity or atrophy Skin: warm and dry, no rash Neuro:  Strength and sensation are intact Psych: euthymic mood, full affect   EKG:  EKG is not ordered today.    Recent Labs: 02/16/2014: ALT 19 07/13/2014: BUN 26*; Creatinine, Ser 1.3*; Potassium 4.0; Sodium 135    Lipid Panel    Component Value Date/Time   CHOL 150 02/16/2014 0752   TRIG 112.0 02/16/2014 0752   HDL 45.20 02/16/2014 0752   CHOLHDL 3 02/16/2014 0752   VLDL 22.4 02/16/2014 0752   LDLCALC 82 02/16/2014 0752      Wt Readings from Last 3 Encounters:  01/31/15 142 lb 6.4 oz (64.592 kg)  07/13/14 147 lb (66.679 kg)  01/13/14 149 lb (67.586 kg)     ASSESSMENT AND PLAN:  1. Chronic atrial fibrillation rate controlled - continue Warfarin/metoprolol  2. HTN - controlled and soft today. BP at home runs 123/93mmHg  - continue metoprolol/amlodipine/Cozaar - check BMET  3. Pulmonary HTN - mild by echo in 2015.   Current medicines are reviewed at length with the patient today.  The patient does not have concerns regarding medicines.  The following changes have been made:  no change  Labs/ tests ordered today: See above Assessment and Plan No orders of the defined types were placed in this encounter.     Disposition:   FU with me in 6 months  Signed, [name], MD  01/31/2015 1:22 PM    [facility] [place], [place], [place]  [address] Phone: [phone]; Fax: [phone]

## 2015-02-08 ENCOUNTER — Encounter: Payer: Self-pay | Admitting: Cardiology

## 2015-02-16 DIAGNOSIS — I83893 Varicose veins of bilateral lower extremities with other complications: Secondary | ICD-10-CM | POA: Diagnosis not present

## 2015-02-16 DIAGNOSIS — I83811 Varicose veins of right lower extremities with pain: Secondary | ICD-10-CM | POA: Diagnosis not present

## 2015-02-20 DIAGNOSIS — I83811 Varicose veins of right lower extremities with pain: Secondary | ICD-10-CM | POA: Diagnosis not present

## 2015-02-20 DIAGNOSIS — I83893 Varicose veins of bilateral lower extremities with other complications: Secondary | ICD-10-CM | POA: Diagnosis not present

## 2015-02-22 ENCOUNTER — Ambulatory Visit (INDEPENDENT_AMBULATORY_CARE_PROVIDER_SITE_OTHER): Payer: Medicare Other | Admitting: *Deleted

## 2015-02-22 DIAGNOSIS — Z5181 Encounter for therapeutic drug level monitoring: Secondary | ICD-10-CM | POA: Diagnosis not present

## 2015-02-22 DIAGNOSIS — I4891 Unspecified atrial fibrillation: Secondary | ICD-10-CM | POA: Diagnosis not present

## 2015-02-22 LAB — POCT INR: INR: 3.5

## 2015-03-06 DIAGNOSIS — M545 Low back pain: Secondary | ICD-10-CM | POA: Diagnosis not present

## 2015-03-08 ENCOUNTER — Ambulatory Visit (INDEPENDENT_AMBULATORY_CARE_PROVIDER_SITE_OTHER): Payer: Medicare Other | Admitting: *Deleted

## 2015-03-08 DIAGNOSIS — Z5181 Encounter for therapeutic drug level monitoring: Secondary | ICD-10-CM | POA: Diagnosis not present

## 2015-03-08 DIAGNOSIS — I4891 Unspecified atrial fibrillation: Secondary | ICD-10-CM | POA: Diagnosis not present

## 2015-03-08 LAB — POCT INR: INR: 1.7

## 2015-03-14 DIAGNOSIS — M545 Low back pain: Secondary | ICD-10-CM | POA: Diagnosis not present

## 2015-03-16 DIAGNOSIS — M545 Low back pain: Secondary | ICD-10-CM | POA: Diagnosis not present

## 2015-03-20 DIAGNOSIS — N3941 Urge incontinence: Secondary | ICD-10-CM | POA: Diagnosis not present

## 2015-03-20 DIAGNOSIS — R32 Unspecified urinary incontinence: Secondary | ICD-10-CM | POA: Diagnosis not present

## 2015-03-22 ENCOUNTER — Ambulatory Visit (INDEPENDENT_AMBULATORY_CARE_PROVIDER_SITE_OTHER): Payer: Medicare Other | Admitting: *Deleted

## 2015-03-22 DIAGNOSIS — Z5181 Encounter for therapeutic drug level monitoring: Secondary | ICD-10-CM | POA: Diagnosis not present

## 2015-03-22 DIAGNOSIS — I4891 Unspecified atrial fibrillation: Secondary | ICD-10-CM | POA: Diagnosis not present

## 2015-03-22 DIAGNOSIS — M545 Low back pain: Secondary | ICD-10-CM | POA: Diagnosis not present

## 2015-03-22 LAB — POCT INR: INR: 2.2

## 2015-03-26 DIAGNOSIS — M545 Low back pain: Secondary | ICD-10-CM | POA: Diagnosis not present

## 2015-03-30 DIAGNOSIS — M545 Low back pain: Secondary | ICD-10-CM | POA: Diagnosis not present

## 2015-04-02 DIAGNOSIS — M545 Low back pain: Secondary | ICD-10-CM | POA: Diagnosis not present

## 2015-04-04 DIAGNOSIS — M533 Sacrococcygeal disorders, not elsewhere classified: Secondary | ICD-10-CM | POA: Diagnosis not present

## 2015-04-12 ENCOUNTER — Ambulatory Visit (INDEPENDENT_AMBULATORY_CARE_PROVIDER_SITE_OTHER): Payer: Medicare Other | Admitting: *Deleted

## 2015-04-12 DIAGNOSIS — I4891 Unspecified atrial fibrillation: Secondary | ICD-10-CM | POA: Diagnosis not present

## 2015-04-12 DIAGNOSIS — Z5181 Encounter for therapeutic drug level monitoring: Secondary | ICD-10-CM

## 2015-04-12 LAB — POCT INR: INR: 2.4

## 2015-04-13 DIAGNOSIS — H16223 Keratoconjunctivitis sicca, not specified as Sjogren's, bilateral: Secondary | ICD-10-CM | POA: Diagnosis not present

## 2015-04-17 DIAGNOSIS — I481 Persistent atrial fibrillation: Secondary | ICD-10-CM | POA: Diagnosis not present

## 2015-04-17 DIAGNOSIS — Z23 Encounter for immunization: Secondary | ICD-10-CM | POA: Diagnosis not present

## 2015-04-17 DIAGNOSIS — E785 Hyperlipidemia, unspecified: Secondary | ICD-10-CM | POA: Diagnosis not present

## 2015-04-17 DIAGNOSIS — N183 Chronic kidney disease, stage 3 (moderate): Secondary | ICD-10-CM | POA: Diagnosis not present

## 2015-04-17 DIAGNOSIS — M899 Disorder of bone, unspecified: Secondary | ICD-10-CM | POA: Diagnosis not present

## 2015-04-17 DIAGNOSIS — Z0000 Encounter for general adult medical examination without abnormal findings: Secondary | ICD-10-CM | POA: Diagnosis not present

## 2015-04-17 DIAGNOSIS — R7309 Other abnormal glucose: Secondary | ICD-10-CM | POA: Diagnosis not present

## 2015-04-17 DIAGNOSIS — I10 Essential (primary) hypertension: Secondary | ICD-10-CM | POA: Diagnosis not present

## 2015-04-17 DIAGNOSIS — Z7901 Long term (current) use of anticoagulants: Secondary | ICD-10-CM | POA: Diagnosis not present

## 2015-04-17 DIAGNOSIS — E042 Nontoxic multinodular goiter: Secondary | ICD-10-CM | POA: Diagnosis not present

## 2015-04-17 DIAGNOSIS — K219 Gastro-esophageal reflux disease without esophagitis: Secondary | ICD-10-CM | POA: Diagnosis not present

## 2015-04-17 DIAGNOSIS — D649 Anemia, unspecified: Secondary | ICD-10-CM | POA: Diagnosis not present

## 2015-04-19 DIAGNOSIS — M533 Sacrococcygeal disorders, not elsewhere classified: Secondary | ICD-10-CM | POA: Diagnosis not present

## 2015-04-23 DIAGNOSIS — M545 Low back pain: Secondary | ICD-10-CM | POA: Diagnosis not present

## 2015-04-24 ENCOUNTER — Ambulatory Visit (INDEPENDENT_AMBULATORY_CARE_PROVIDER_SITE_OTHER): Payer: Medicare Other | Admitting: Podiatry

## 2015-04-24 ENCOUNTER — Encounter: Payer: Self-pay | Admitting: Podiatry

## 2015-04-24 DIAGNOSIS — B351 Tinea unguium: Secondary | ICD-10-CM | POA: Diagnosis not present

## 2015-04-24 DIAGNOSIS — L84 Corns and callosities: Secondary | ICD-10-CM

## 2015-04-24 DIAGNOSIS — M79673 Pain in unspecified foot: Secondary | ICD-10-CM | POA: Diagnosis not present

## 2015-04-24 DIAGNOSIS — M2041 Other hammer toe(s) (acquired), right foot: Secondary | ICD-10-CM

## 2015-04-24 NOTE — Progress Notes (Signed)
Subjective:     Patient ID: Mallory Stephens, female   DOB: [birth year], 81 y.o.   MRN: [id number]  HPIThis patient presents to the office for treatment of her long thick nails.  She says the nails on the right foot are thickened and disfigured.  She also has painful corns on second toe right foot.  She desires evaluation and preventive foot care services.   Review of Systems     Objective:   Physical Exam GENERAL APPEARANCE: Alert, conversant. Appropriately groomed. No acute distress.  VASCULAR: Pedal pulses palpable at  [facility] and PT bilateral.  Capillary refill time is immediate to all digits,  Normal temperature gradient.  Digital hair growth is present bilateral  NEUROLOGIC: sensation is normal to 5.07 monofilament at 5/5 sites bilateral.  Light touch is intact bilateral, Muscle strength normal.  MUSCULOSKELETAL: acceptable muscle strength, tone and stability bilateral.  Intrinsic muscluature intact bilateral.  Rectus appearance of foot and digits noted bilateral. HAV and tailor bunions noted both feet.    DERMATOLOGIC: skin color, texture, and turgor are within normal limits.  No preulcerative lesions or ulcers  are seen, no interdigital maceration noted.  No open lesions present.  Digital nails are asymptomatic. No drainage noted.      Assessment:     Onychomycosis   Corn second toe right foot.     Plan:     Debridement of nails.  Debridement of corn second toe right foot.

## 2015-04-25 DIAGNOSIS — M545 Low back pain: Secondary | ICD-10-CM | POA: Diagnosis not present

## 2015-04-25 DIAGNOSIS — I83893 Varicose veins of bilateral lower extremities with other complications: Secondary | ICD-10-CM | POA: Diagnosis not present

## 2015-04-27 DIAGNOSIS — H04123 Dry eye syndrome of bilateral lacrimal glands: Secondary | ICD-10-CM | POA: Diagnosis not present

## 2015-04-27 IMAGING — CR DG CHEST 2V
2 series · 2 of 2 positions shown · non-contrast
Comparison: Chest radiograph 10/23/2008

CLINICAL DATA: Choked on food. Possible aspiration 2-3 weeks ago.
Persistent cough low-grade fever.

EXAM:
CHEST  2 VIEW

[w chest pa]
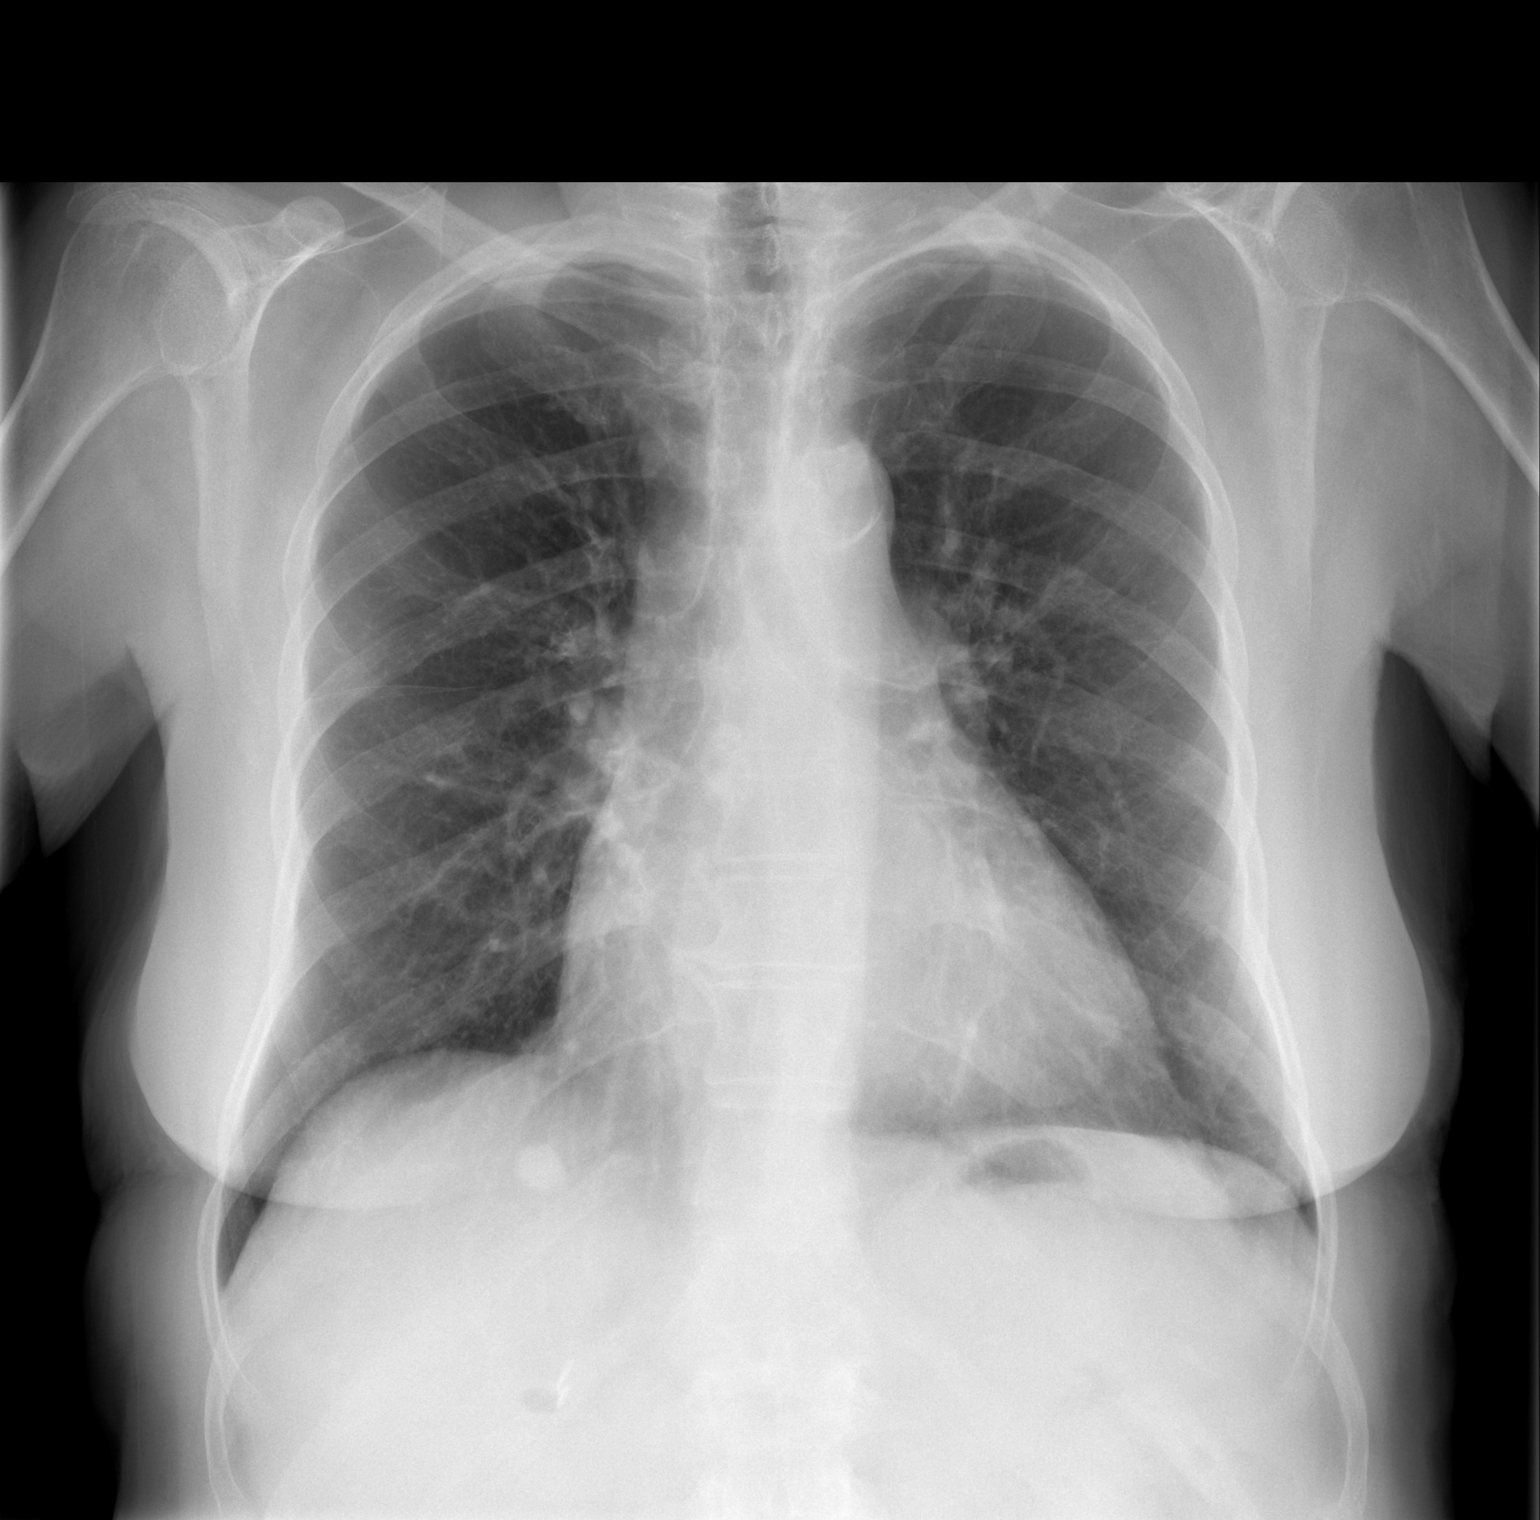

[w chest lat]
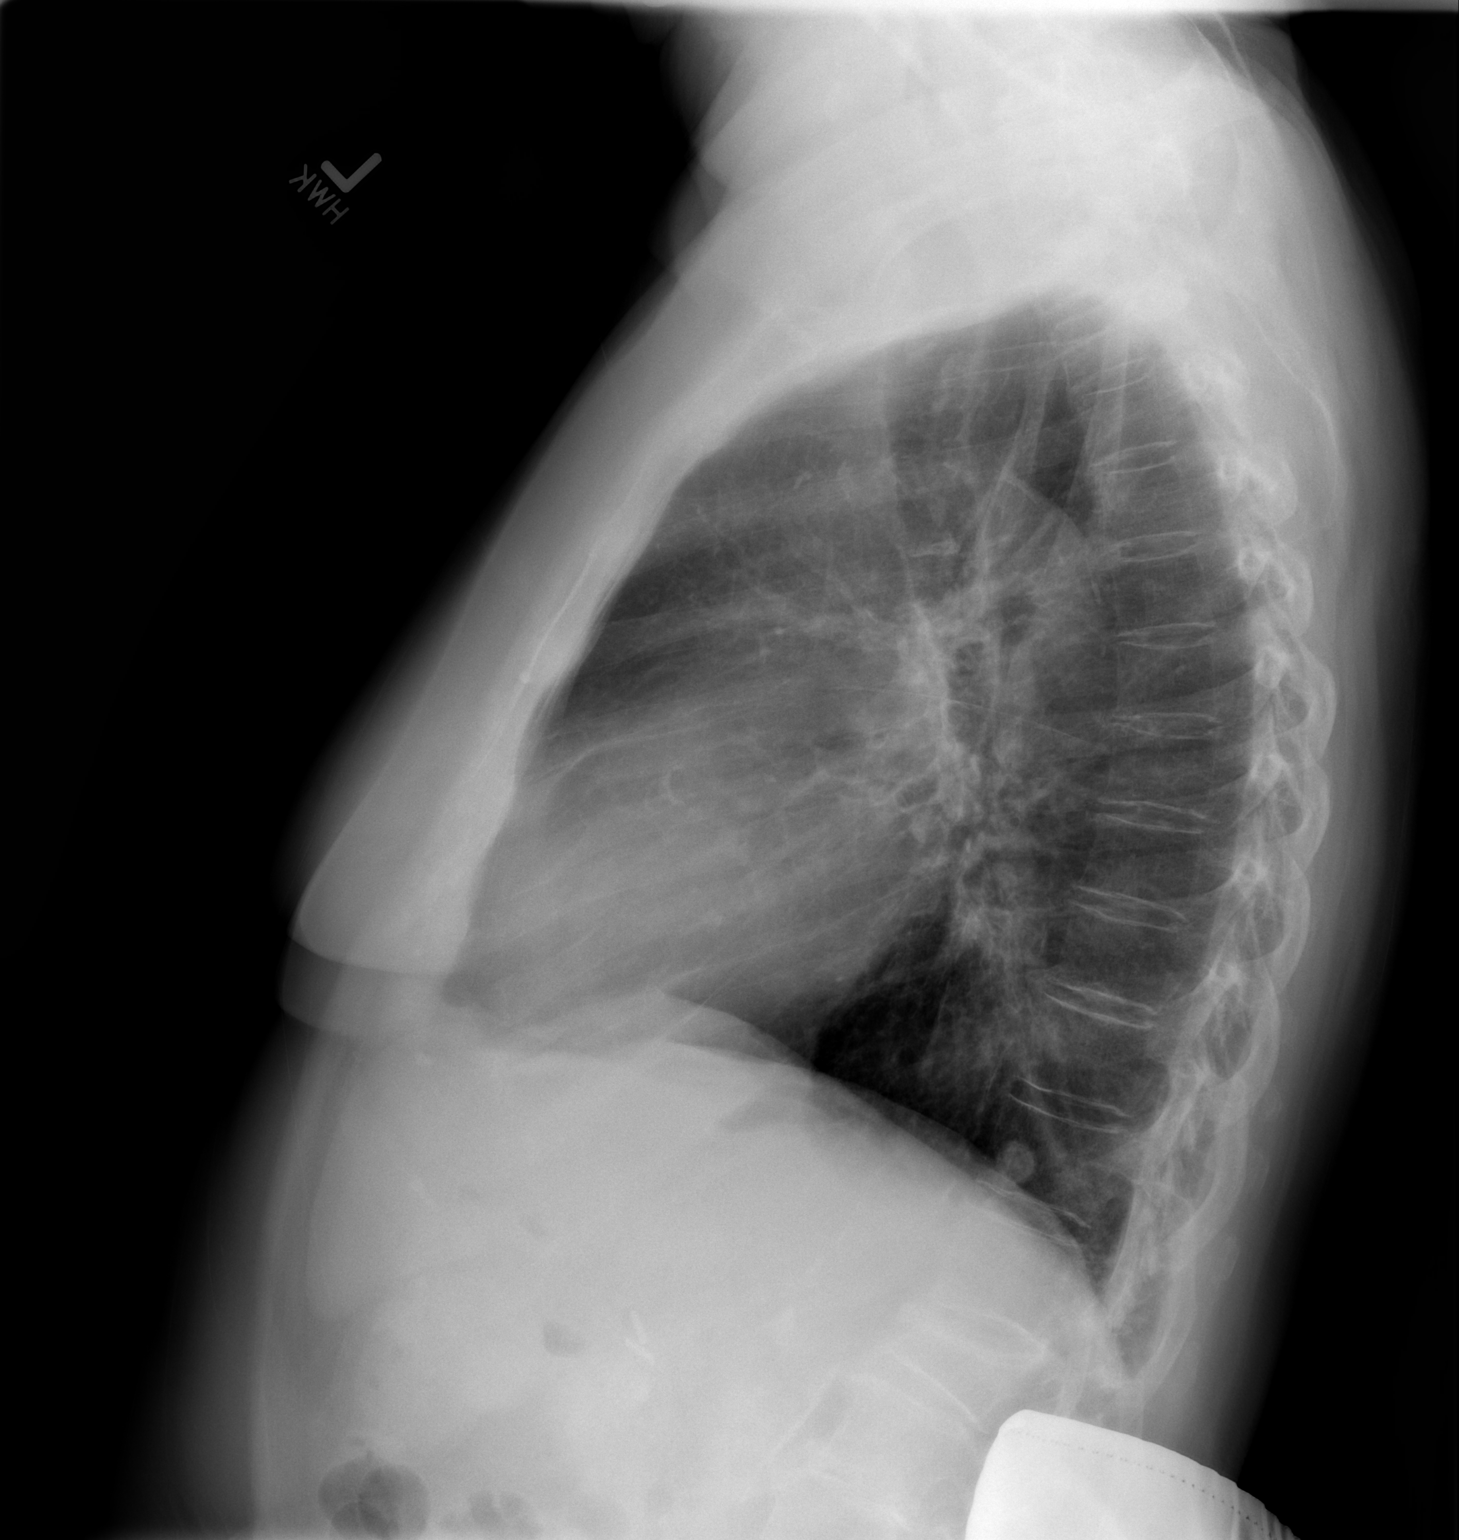

[2 of 2 positions shown; findings below may reference images not displayed]

FINDINGS: Stable enlarged cardiac and mediastinal contours. No consolidative
pulmonary opacities. No pleural effusion or pneumothorax. Regional
skeleton is unremarkable. Stable calcified granuloma right lower
lobe. Cholecystectomy clips.
IMPRESSION: No acute cardiopulmonary process

## 2015-04-30 DIAGNOSIS — M545 Low back pain: Secondary | ICD-10-CM | POA: Diagnosis not present

## 2015-05-03 DIAGNOSIS — M545 Low back pain: Secondary | ICD-10-CM | POA: Diagnosis not present

## 2015-05-04 DIAGNOSIS — M533 Sacrococcygeal disorders, not elsewhere classified: Secondary | ICD-10-CM | POA: Diagnosis not present

## 2015-05-10 DIAGNOSIS — M545 Low back pain: Secondary | ICD-10-CM | POA: Diagnosis not present

## 2015-05-14 DIAGNOSIS — M545 Low back pain: Secondary | ICD-10-CM | POA: Diagnosis not present

## 2015-05-16 DIAGNOSIS — M545 Low back pain: Secondary | ICD-10-CM | POA: Diagnosis not present

## 2015-05-21 DIAGNOSIS — M545 Low back pain: Secondary | ICD-10-CM | POA: Diagnosis not present

## 2015-05-24 ENCOUNTER — Ambulatory Visit (INDEPENDENT_AMBULATORY_CARE_PROVIDER_SITE_OTHER): Payer: Medicare Other | Admitting: *Deleted

## 2015-05-24 DIAGNOSIS — I4891 Unspecified atrial fibrillation: Secondary | ICD-10-CM

## 2015-05-24 DIAGNOSIS — Z5181 Encounter for therapeutic drug level monitoring: Secondary | ICD-10-CM | POA: Diagnosis not present

## 2015-05-24 LAB — POCT INR: INR: 2.6

## 2015-07-02 ENCOUNTER — Other Ambulatory Visit: Payer: Self-pay | Admitting: Cardiology

## 2015-07-05 ENCOUNTER — Ambulatory Visit (INDEPENDENT_AMBULATORY_CARE_PROVIDER_SITE_OTHER): Payer: Medicare Other | Admitting: *Deleted

## 2015-07-05 DIAGNOSIS — I4891 Unspecified atrial fibrillation: Secondary | ICD-10-CM

## 2015-07-05 DIAGNOSIS — Z5181 Encounter for therapeutic drug level monitoring: Secondary | ICD-10-CM | POA: Diagnosis not present

## 2015-07-05 LAB — POCT INR: INR: 1.9

## 2015-07-07 ENCOUNTER — Other Ambulatory Visit: Payer: Self-pay | Admitting: Cardiology

## 2015-07-24 ENCOUNTER — Ambulatory Visit: Payer: Medicare Other | Admitting: Podiatry

## 2015-08-09 ENCOUNTER — Encounter: Payer: Self-pay | Admitting: Cardiology

## 2015-08-09 ENCOUNTER — Ambulatory Visit (INDEPENDENT_AMBULATORY_CARE_PROVIDER_SITE_OTHER): Payer: Medicare Other | Admitting: *Deleted

## 2015-08-09 ENCOUNTER — Ambulatory Visit (INDEPENDENT_AMBULATORY_CARE_PROVIDER_SITE_OTHER): Payer: Medicare Other | Admitting: Cardiology

## 2015-08-09 VITALS — BP 130/76 | HR 79 | Ht 66.0 in | Wt 150.8 lb

## 2015-08-09 DIAGNOSIS — Z5181 Encounter for therapeutic drug level monitoring: Secondary | ICD-10-CM

## 2015-08-09 DIAGNOSIS — I482 Chronic atrial fibrillation, unspecified: Secondary | ICD-10-CM

## 2015-08-09 DIAGNOSIS — I4891 Unspecified atrial fibrillation: Secondary | ICD-10-CM | POA: Diagnosis not present

## 2015-08-09 DIAGNOSIS — I10 Essential (primary) hypertension: Secondary | ICD-10-CM

## 2015-08-09 DIAGNOSIS — I272 Other secondary pulmonary hypertension: Secondary | ICD-10-CM

## 2015-08-09 LAB — BASIC METABOLIC PANEL
BUN: 28 mg/dL — ABNORMAL HIGH (ref 7–25)
CO2: 25 mmol/L (ref 20–31)
Calcium: 10.1 mg/dL (ref 8.6–10.4)
Chloride: 105 mmol/L (ref 98–110)
Creat: 1.32 mg/dL — ABNORMAL HIGH (ref 0.60–0.88)
Glucose, Bld: 109 mg/dL — ABNORMAL HIGH (ref 65–99)
Potassium: 4.1 mmol/L (ref 3.5–5.3)
Sodium: 140 mmol/L (ref 135–146)

## 2015-08-09 LAB — POCT INR: INR: 2.4

## 2015-08-09 NOTE — Patient Instructions (Signed)
Medication Instructions:  Your physician recommends that you continue on your current medications as directed. Please refer to the Current Medication list given to you today.   Labwork: TODAY: BMET  Testing/Procedures: Your physician has requested that you have an echocardiogram. Echocardiography is a painless test that uses sound waves to create images of your heart. It provides your doctor with information about the size and shape of your heart and how well your heart's chambers and valves are working. This procedure takes approximately one hour. There are no restrictions for this procedure.  Follow-Up: Your physician wants you to follow-up in: 1 year with Dr. Turner. You will receive a reminder letter in the mail two months in advance. If you don't receive a letter, please call our office to schedule the follow-up appointment.   Any Other Special Instructions Will Be Listed Below (If Applicable).     If you need a refill on your cardiac medications before your next appointment, please call your pharmacy.   

## 2015-08-09 NOTE — Progress Notes (Signed)
Cardiology Office Note   Date:  08/09/2015   ID:  Mallory Stephens, DOB Jul 31, 1933, MRN [id number]  PCP:  Lilian Coma, MD    Chief Complaint  Patient presents with  . Atrial Fibrillation  . Hypertension      History of Present Illness: Mallory Stephens is a 82y.o. female with a history of chronic atrial fibrillation and HTN who presents today for followup. She is doing well. She denies any chest pain or pressure, palpitations, dizziness or syncope.  She denies any SOB or DOE. She has chronic LE edema mainly at night.      Past Medical History  Diagnosis Date  . Dyslipidemia   . Osteoarthritis   . PUD (peptic ulcer disease)   . GERD (gastroesophageal reflux disease)   . Multinodular goiter   . Pulmonary nodules     followed  by Dr. Stephanie Acre  . Fatty liver   . Lymphocytosis     followed by Dr. Julien Nordmann  . PONV (postoperative nausea and vomiting)   . Hypertension   . Chronic atrial fibrillation (HCC)     atrial fibrillation s/p DCCV 3/10 and 6/10  . Pulmonary HTN ([place])     mild by echo 2011    Past Surgical History  Procedure Laterality Date  . Breast surgery  1962    right breast for benign lesion  . Breast surgery  1971    bilateral lumpectomy for beingn lesions  . Cystoscopy    . Aspiration of cyst  1978  . Dilation and curettage of uterus  1979  . Cholecystectomy  2003  . Joint replacement  2002    Right knee  . Joint replacement  2007    left knee  . Cardioversion  07/22/2011    Procedure: CARDIOVERSION;  Surgeon: [name], MD;  Location: [facility];  Service: Cardiovascular;  Laterality: N/A;  . Cardioversion  09/02/2011    Procedure: CARDIOVERSION;  Surgeon: [name], MD;  Location: [facility];  Service: Cardiovascular;  Laterality: N/A;     Current Outpatient Prescriptions  Medication Sig Dispense Refill  . amLODipine (NORVASC) 10 MG tablet Take 10 mg by mouth daily.     . cholecalciferol (VITAMIN D) 1000 UNITS tablet  Take 1,000 Units by mouth daily as needed. Patient takes occasionally.    Marland Kitchen losartan (COZAAR) 100 MG tablet TAKE 1 TABLET BY MOUTH EVERY DAY 90 tablet 1  . metoprolol succinate (TOPROL XL) 25 MG 24 hr tablet Take 1 tablet (25 mg total) by mouth daily. 30 tablet 11  . Multiple Vitamins-Minerals (MULTIVITAMINS THER. W/MINERALS) TABS Take 1 tablet by mouth daily.     Marland Kitchen omega-3 acid ethyl esters (LOVAZA) 1 G capsule Take 1 g by mouth 2 (two) times daily.     Marland Kitchen PARoxetine (PAXIL) 10 MG tablet Take 10 mg by mouth every morning.     . pravastatin (PRAVACHOL) 20 MG tablet Take 20 mg by mouth daily.      . ranitidine (ZANTAC) 300 MG tablet Take 300 mg by mouth at bedtime.     . solifenacin (VESICARE) 5 MG tablet Take 5 mg by mouth daily.    Marland Kitchen warfarin (COUMADIN) 5 MG tablet TAKE AS DIRECTED BY COUMADIN CLINIC 90 tablet 0   No current facility-administered medications for this visit.    Allergies:   Sulfa drugs cross reactors    Social History:  The patient  reports that she quit smoking about 26 years ago. She does not have any smokeless tobacco history on file. She reports that she drinks alcohol. She reports that she does not use illicit drugs.   Family History:  The patient's family history includes Arthritis in her sister; Breast cancer in her sister; Heart disease in her brother; Prostate cancer in her brother and brother; Scoliosis in her sister; Stroke in her father and mother.    ROS:  Please see the history of present illness.   Otherwise, review of systems are positive for none.   All other systems are reviewed and negative.    PHYSICAL EXAM: VS:  BP 130/76 mmHg  Pulse 79  Ht 5\' 6"  (1.676 m)  Wt 150 lb 12.8 oz (68.402 kg)  BMI 24.35 kg/m2 , BMI Body mass index is 24.35 kg/(m^2). GEN: Well nourished, well developed, in no acute distress HEENT: normal Neck: no JVD, carotid bruits, or masses Cardiac: RRR; no murmurs, rubs, or gallops,no edema  Respiratory:  clear to auscultation  bilaterally, normal work of breathing GI: soft, nontender, nondistended, + BS MS: no deformity or atrophy Skin: warm and dry, no rash Neuro:  Strength and sensation are intact Psych: euthymic mood, full affect   EKG:  EKG was ordered today showing atrial fibrillation with CVR with nonospecific T wave abnormality   Recent Labs: 01/31/2015: BUN 42*; Creatinine, Ser 2.07*; Potassium 3.9; Sodium 140    Lipid Panel    Component Value Date/Time   CHOL 150 02/16/2014 0752   TRIG 112.0 02/16/2014 0752   HDL 45.20 02/16/2014 0752   CHOLHDL 3 02/16/2014 0752   VLDL 22.4 02/16/2014 0752   LDLCALC 82 02/16/2014 0752      Wt Readings from Last 3 Encounters:  08/09/15 150 lb 12.8 oz (68.402 kg)  01/31/15 142 lb 6.4 oz (64.592 kg)  07/13/14 147 lb (66.679 kg)    ASSESSMENT AND PLAN:  1. Chronic atrial fibrillation rate controlled - continue Warfarin/metoprolol  2. HTN - controlled  - continue metoprolol/amlodipine/Cozaar - check BMET  3. Pulmonary HTN - mild by echo in 2015. Repeat echo to assess for progression       4.  CKD stage III - per PCP    Current medicines are reviewed at length with the patient today.  The patient does not have concerns regarding medicines.  The following changes have been made:  no change  Labs/ tests ordered today: See above Assessment and Plan No orders of the defined types were placed in this encounter.     Disposition:   FU with me in 1 year  Signed, [name], MD  08/09/2015 10:43 AM    [facility] [place], [place], [place]  [address] Phone: [phone]; Fax: [phone]

## 2015-08-15 ENCOUNTER — Ambulatory Visit (HOSPITAL_COMMUNITY): Payer: Medicare Other | Attending: Cardiology

## 2015-08-15 ENCOUNTER — Other Ambulatory Visit: Payer: Self-pay

## 2015-08-15 DIAGNOSIS — I272 Other secondary pulmonary hypertension: Secondary | ICD-10-CM | POA: Insufficient documentation

## 2015-08-21 ENCOUNTER — Ambulatory Visit (INDEPENDENT_AMBULATORY_CARE_PROVIDER_SITE_OTHER): Payer: Medicare Other | Admitting: Podiatry

## 2015-08-21 ENCOUNTER — Encounter: Payer: Self-pay | Admitting: Podiatry

## 2015-08-21 DIAGNOSIS — M79673 Pain in unspecified foot: Secondary | ICD-10-CM

## 2015-08-21 DIAGNOSIS — B351 Tinea unguium: Secondary | ICD-10-CM

## 2015-08-21 NOTE — Progress Notes (Signed)
Subjective:     Patient ID: Mallory Stephens, female   DOB: 1933-07-04, 82 y.o.   MRN: [id number]  HPIThis patient presents to the office for treatment of her long thick nails.  She says the nails on the right foot are thickened and disfigured.  She also has hammer toe  on second toe right foot.  She desires evaluation and preventive foot care services.   Review of Systems     Objective:   Physical Exam GENERAL APPEARANCE: Alert, conversant. Appropriately groomed. No acute distress.  VASCULAR: Pedal pulses palpable at  [facility] and PT bilateral.  Capillary refill time is immediate to all digits,  Normal temperature gradient.  Digital hair growth is present bilateral  NEUROLOGIC: sensation is normal to 5.07 monofilament at 5/5 sites bilateral.  Light touch is intact bilateral, Muscle strength normal.  MUSCULOSKELETAL: acceptable muscle strength, tone and stability bilateral.  Intrinsic muscluature intact bilateral.  Rectus appearance of foot and digits noted bilateral. HAV and tailor bunions noted both feet.    DERMATOLOGIC: skin color, texture, and turgor are within normal limits.  No preulcerative lesions or ulcers  are seen, no interdigital maceration noted.  No open lesions present.  . No drainage noted.  NAILS  Thick disfigured toes both feet.        Assessment:     Onychomycosis   Corn second toe right foot.     Plan:     Debridement of nails. RTC 3 months   Gardiner Barefoot DPM

## 2015-09-20 ENCOUNTER — Ambulatory Visit (INDEPENDENT_AMBULATORY_CARE_PROVIDER_SITE_OTHER): Payer: Medicare Other

## 2015-09-20 DIAGNOSIS — I4891 Unspecified atrial fibrillation: Secondary | ICD-10-CM | POA: Diagnosis not present

## 2015-09-20 DIAGNOSIS — Z5181 Encounter for therapeutic drug level monitoring: Secondary | ICD-10-CM

## 2015-09-20 LAB — POCT INR: INR: 2.3

## 2015-10-23 ENCOUNTER — Emergency Department (HOSPITAL_COMMUNITY): Payer: Medicare Other

## 2015-10-23 ENCOUNTER — Emergency Department (HOSPITAL_COMMUNITY)
Admission: EM | Admit: 2015-10-23 | Discharge: 2015-10-23 | Disposition: A | Discharge status: Home / Self Care | Payer: Medicare Other | Attending: Emergency Medicine | Admitting: Emergency Medicine

## 2015-10-23 ENCOUNTER — Encounter (HOSPITAL_COMMUNITY): Payer: Self-pay | Admitting: Emergency Medicine

## 2015-10-23 DIAGNOSIS — Z79899 Other long term (current) drug therapy: Secondary | ICD-10-CM | POA: Diagnosis not present

## 2015-10-23 DIAGNOSIS — E785 Hyperlipidemia, unspecified: Secondary | ICD-10-CM | POA: Insufficient documentation

## 2015-10-23 DIAGNOSIS — I10 Essential (primary) hypertension: Secondary | ICD-10-CM | POA: Insufficient documentation

## 2015-10-23 DIAGNOSIS — Z87891 Personal history of nicotine dependence: Secondary | ICD-10-CM | POA: Insufficient documentation

## 2015-10-23 DIAGNOSIS — Y92000 Kitchen of unspecified non-institutional (private) residence as  the place of occurrence of the external cause: Secondary | ICD-10-CM | POA: Diagnosis not present

## 2015-10-23 DIAGNOSIS — M1990 Unspecified osteoarthritis, unspecified site: Secondary | ICD-10-CM | POA: Insufficient documentation

## 2015-10-23 DIAGNOSIS — Z8711 Personal history of peptic ulcer disease: Secondary | ICD-10-CM | POA: Insufficient documentation

## 2015-10-23 DIAGNOSIS — W01198A Fall on same level from slipping, tripping and stumbling with subsequent striking against other object, initial encounter: Secondary | ICD-10-CM | POA: Insufficient documentation

## 2015-10-23 DIAGNOSIS — Y9301 Activity, walking, marching and hiking: Secondary | ICD-10-CM | POA: Diagnosis not present

## 2015-10-23 DIAGNOSIS — R0781 Pleurodynia: Secondary | ICD-10-CM

## 2015-10-23 DIAGNOSIS — I482 Chronic atrial fibrillation: Secondary | ICD-10-CM | POA: Diagnosis not present

## 2015-10-23 DIAGNOSIS — S29001A Unspecified injury of muscle and tendon of front wall of thorax, initial encounter: Secondary | ICD-10-CM | POA: Diagnosis not present

## 2015-10-23 DIAGNOSIS — Z862 Personal history of diseases of the blood and blood-forming organs and certain disorders involving the immune mechanism: Secondary | ICD-10-CM | POA: Diagnosis not present

## 2015-10-23 DIAGNOSIS — M25522 Pain in left elbow: Secondary | ICD-10-CM | POA: Diagnosis not present

## 2015-10-23 DIAGNOSIS — Y998 Other external cause status: Secondary | ICD-10-CM | POA: Insufficient documentation

## 2015-10-23 DIAGNOSIS — Z7901 Long term (current) use of anticoagulants: Secondary | ICD-10-CM | POA: Diagnosis not present

## 2015-10-23 DIAGNOSIS — M25521 Pain in right elbow: Secondary | ICD-10-CM | POA: Diagnosis not present

## 2015-10-23 DIAGNOSIS — K219 Gastro-esophageal reflux disease without esophagitis: Secondary | ICD-10-CM | POA: Diagnosis not present

## 2015-10-23 DIAGNOSIS — S8992XA Unspecified injury of left lower leg, initial encounter: Secondary | ICD-10-CM | POA: Diagnosis not present

## 2015-10-23 DIAGNOSIS — S59902A Unspecified injury of left elbow, initial encounter: Secondary | ICD-10-CM | POA: Diagnosis not present

## 2015-10-23 DIAGNOSIS — M25561 Pain in right knee: Secondary | ICD-10-CM | POA: Diagnosis not present

## 2015-10-23 DIAGNOSIS — S0990XA Unspecified injury of head, initial encounter: Secondary | ICD-10-CM | POA: Diagnosis not present

## 2015-10-23 DIAGNOSIS — W19XXXA Unspecified fall, initial encounter: Secondary | ICD-10-CM

## 2015-10-23 DIAGNOSIS — M25562 Pain in left knee: Secondary | ICD-10-CM | POA: Diagnosis not present

## 2015-10-23 NOTE — ED Notes (Signed)
Patient states she was walking across the kitchen floor and tripped over dog leash. Patient hit head, bilateral knees, bilateral elbows, and ribs. Patient is on coumadin.

## 2015-10-23 NOTE — Progress Notes (Signed)
CSW met with patient at bedside. Patient was alert and oriented. Sister and niece were present. Patient confirms that she presents to [facility] due to fall.   Patient denies falling often. Patient states that she lives at home with her sister and completes her ADL's independently. Family is supportive.  Patient is not interested in facility information at this time.   Willette Brace [phone] ED CSW 10/23/2015 8:19 PM

## 2015-10-23 NOTE — ED Notes (Signed)
Provider in room  

## 2015-10-23 NOTE — ED Notes (Signed)
Pt reports L ribcage pain increasing w/ deep breaths and movement.  Pt easily speaking in full sentences.  Pt on coumadin and concerned about a bleed.  Pt at neuro baseline.  Denies headache.

## 2015-10-23 NOTE — Discharge Instructions (Signed)
Rest, tylenol as needed for pain, ice affected area as needed (instructions below). Please follow up with your primary doctor in 3 days for discussion of your diagnoses and further evaluation after today's visit; Please return to the ER for shortness of breath, vomiting, altered mental status, new or worsening symptoms, any additional concerns.   COLD THERAPY DIRECTIONS:  Ice or gel packs can be used to reduce both pain and swelling. Ice is the most helpful within the first 24 to 48 hours after an injury or flareup from overusing a muscle or joint.  Ice is effective, has very few side effects, and is safe for most people to use.   If you expose your skin to cold temperatures for too long or without the proper protection, you can damage your skin or nerves. Watch for signs of skin damage due to cold.   HOME CARE INSTRUCTIONS  Follow these tips to use ice and cold packs safely.  Place a dry or damp towel between the ice and skin. A damp towel will cool the skin more quickly, so you may need to shorten the time that the ice is used.  For a more rapid response, add gentle compression to the ice.  Ice for no more than 10 to 20 minutes at a time. The bonier the area you are icing, the less time it will take to get the benefits of ice.  Check your skin after 5 minutes to make sure there are no signs of a poor response to cold or skin damage.  Rest 20 minutes or more in between uses.  Once your skin is numb, you can end your treatment. You can test numbness by very lightly touching your skin. The touch should be so light that you do not see the skin dimple from the pressure of your fingertip. When using ice, most people will feel these normal sensations in this order: cold, burning, aching, and numbness.

## 2015-10-23 NOTE — ED Notes (Signed)
Pt in CT.

## 2015-10-23 NOTE — ED Provider Notes (Signed)
CSN: [id number]     Arrival date & time 10/23/15  1320 History   First MD Initiated Contact with Patient 10/23/15 1840     Chief Complaint  Patient presents with  . Fall     (Consider location/radiation/quality/duration/timing/severity/associated sxs/prior Treatment) Patient is a 82 y.o. female presenting with fall. The history is provided by the patient and medical records. No language interpreter was used.  Fall Associated symptoms include arthralgias and myalgias. Pertinent negatives include no abdominal pain, chest pain, congestion, coughing, fever, headaches, nausea or vomiting.  Mallory Stephens is a 82 y.o. female  with a PMH of afib on coumadin, GERD, HTN who presents to the Emergency Department after a fall just prior to arrival. Patient states she was walking across the kitchen when she tripped over a dog leash and fell forward landing on her elbows and knees then hitting her forehead. Patient is complaining of left rib pain which is worse with breathing and movement. Admits to myalgias of all 4 extremities. Denies LOC, headache.   Past Medical History  Diagnosis Date  . Dyslipidemia   . Osteoarthritis   . PUD (peptic ulcer disease)   . GERD (gastroesophageal reflux disease)   . Multinodular goiter   . Pulmonary nodules     followed  by Dr. Stephanie Acre  . Fatty liver   . Lymphocytosis     followed by Dr. Julien Nordmann  . PONV (postoperative nausea and vomiting)   . Hypertension   . Chronic atrial fibrillation (HCC)     atrial fibrillation s/p DCCV 3/10 and 6/10  . Pulmonary HTN ([place])     mild by echo 2011  . Atrial fibrillation [facility])    Past Surgical History  Procedure Laterality Date  . Breast surgery  1962    right breast for benign lesion  . Breast surgery  1971    bilateral lumpectomy for beingn lesions  . Cystoscopy    . Aspiration of cyst  1978  . Dilation and curettage of uterus  1979  . Cholecystectomy  2003  . Joint replacement  2002    Right knee  . Joint  replacement  2007    left knee  . Cardioversion  07/22/2011    Procedure: CARDIOVERSION;  Surgeon: [name], MD;  Location: [place];  Service: Cardiovascular;  Laterality: N/A;  . Cardioversion  09/02/2011    Procedure: CARDIOVERSION;  Surgeon: [name], MD;  Location: [facility] OR;  Service: Cardiovascular;  Laterality: N/A;   Family History  Problem Relation Age of Onset  . Stroke Mother   . Stroke Father   . Breast cancer Sister   . Heart disease Brother   . Prostate cancer Brother   . Prostate cancer Brother   . Arthritis Sister   . Scoliosis Sister    Social History  Substance Use Topics  . Smoking status: Former Smoker    Quit date: 07/16/1989  . Smokeless tobacco: None  . Alcohol Use: Yes     Comment: occasional wine   OB History    No data available     Review of Systems  Constitutional: Negative for fever.  HENT: Negative for congestion.   Eyes: Negative for visual disturbance.  Respiratory: Negative for cough and shortness of breath.   Cardiovascular: Negative for chest pain.  Gastrointestinal: Negative for nausea, vomiting and abdominal pain.  Musculoskeletal: Positive for myalgias and arthralgias.  Skin: Negative for wound.  Neurological: Negative for headaches.  Hematological: Bruises/bleeds easily.  Allergies  Sulfa drugs cross reactors  Home Medications   Prior to Admission medications   Medication Sig Start Date End Date Taking? Authorizing Provider  acetaminophen (TYLENOL) 325 MG tablet Take 325 mg by mouth every 6 (six) hours as needed for moderate pain.   Yes Historical Provider, MD  amLODipine (NORVASC) 10 MG tablet Take 10 mg by mouth daily.  10/29/14  Yes Historical Provider, MD  cholecalciferol (VITAMIN D) 1000 UNITS tablet Take 1,000 Units by mouth 3 (three) times a week. Patient takes occasionally.   Yes Historical Provider, MD  losartan (COZAAR) 100 MG tablet TAKE 1 TABLET BY MOUTH EVERY DAY 07/09/15  Yes [name], MD   omega-3 acid ethyl esters (LOVAZA) 1 G capsule Take 1 g by mouth 2 (two) times daily.    Yes Historical Provider, MD  PARoxetine (PAXIL) 10 MG tablet Take 10 mg by mouth every morning.    Yes Historical Provider, MD  polyvinyl alcohol (LIQUIFILM TEARS) 1.4 % ophthalmic solution Place 1-2 drops into both eyes daily as needed for dry eyes.   Yes Historical Provider, MD  pravastatin (PRAVACHOL) 20 MG tablet Take 20 mg by mouth at bedtime.    Yes Historical Provider, MD  ranitidine (ZANTAC) 300 MG tablet Take 300 mg by mouth at bedtime.    Yes Historical Provider, MD  solifenacin (VESICARE) 5 MG tablet Take 5 mg by mouth daily.   Yes Historical Provider, MD  warfarin (COUMADIN) 5 MG tablet TAKE AS DIRECTED BY COUMADIN CLINIC Patient taking differently: takes 5 mg tues, thurs, and saturday. Takes half a tablet (2.5 mg all other days) 07/02/15  Yes [name], MD  metoprolol succinate (TOPROL XL) 25 MG 24 hr tablet Take 1 tablet (25 mg total) by mouth daily. 09/02/11 08/09/15  [name], MD   BP 138/81 mmHg  Pulse 68  Temp(Src) 97.8 F (36.6 C) (Oral)  Resp 18  Ht 5\' 5"  (1.651 m)  Wt 68.04 kg  BMI 24.96 kg/m2  SpO2 96% Physical Exam  Constitutional: She is oriented to person, place, and time. She appears well-developed and well-nourished.  Alert and in no acute distress  HENT:  Head: Normocephalic and atraumatic.  Cardiovascular: Intact distal pulses.   Irregularly irregular. Hx of afib.   Pulmonary/Chest: Effort normal and breath sounds normal. No respiratory distress. She has no wheezes. She has no rales. She exhibits tenderness (TTP of left rib cage.).  Abdominal: Soft. Bowel sounds are normal. She exhibits no distension and no mass. There is no tenderness. There is no rebound and no guarding.  Musculoskeletal:  Bilateral elbows and knees with full ROM. No deformities, ecchymosis, erythema, or swelling appreciated. Intact and equal distal pulses 4. Sensation intact 4.   Neurological: She is alert and oriented to person, place, and time.  Alert, oriented, thought content appropriate, able to give a coherent history. Speech is clear and goal oriented, able to follow commands.  Cranial Nerves:  II:  Peripheral visual fields grossly normal, pupils equal, round, reactive to light III, IV, VI: EOM intact bilaterally, ptosis not present V,VII: smile symmetric, eyes kept closed tightly against resistance, facial light touch sensation equal VIII: hearing grossly normal IX, X: symmetric soft palate movement, uvula elevates symmetrically  XI: bilateral shoulder shrug symmetric and strong XII: midline tongue extension 5/5 muscle strength in upper and lower extremities bilaterally including strong and equal grip strength and dorsiflexion/plantar flexion Sensory to light touch normal in all four extremities.  Normal finger-to-nose and  rapid alternating movements.  Skin: Skin is warm and dry.  Psychiatric: She has a normal mood and affect. Her behavior is normal. Judgment and thought content normal.  Nursing note and vitals reviewed.   ED Course  Procedures (including critical care time) Labs Review Labs Reviewed - No data to display  Imaging Review Dg Ribs Unilateral W/chest Left  10/23/2015  CLINICAL DATA:  Left anterior rib pain after falling at home today. Patient tripped over dog. EXAM: LEFT RIBS AND CHEST - 3+ VIEW COMPARISON:  04/27/2014 FINDINGS: The heart is mildly enlarged but stable. There is mild tortuosity and calcification of the thoracic aorta. There are chronic bronchitic type lung changes but no definite acute overlying pulmonary process. No pleural effusion or pneumothorax. Dedicated views of the left ribs do not demonstrate any definite acute rib fractures. IMPRESSION: No acute cardiopulmonary findings. No definite acute left-sided rib fractures. Electronically Signed   By: Marijo Sanes M.D.   On: 10/23/2015 20:02   Dg Elbow Complete  Left  10/23/2015  CLINICAL DATA:  Status post fall at 12:30 p.m. today with a left elbow injury. Pain. Initial encounter. EXAM: LEFT ELBOW - COMPLETE 3+ VIEW COMPARISON:  None. FINDINGS: There is no evidence of fracture, dislocation, or joint effusion. There is no evidence of arthropathy or other focal bone abnormality. Soft tissues are unremarkable. IMPRESSION: Negative exam. Electronically Signed   By: Inge Rise M.D.   On: 10/23/2015 14:43   Dg Elbow Complete Right  10/23/2015  CLINICAL DATA:  Lt elbow pain medially, pt fell around 12:30 pm today, landed on both knees and elbows, generalized bilaterally knee pain , and generalized rt. Elbow pain, pt complaints of lt rib pain as well, go EXAM: RIGHT ELBOW - COMPLETE 3+ VIEW COMPARISON:  None. FINDINGS: No evidence of fracture of the ulna or humerus. The radial head is normal. No joint effusion. IMPRESSION: No fracture or dislocation. Electronically Signed   By: Suzy Bouchard M.D.   On: 10/23/2015 14:43   Ct Head Wo Contrast  10/23/2015  CLINICAL DATA:  Fall.  Head injury.  Patient is on Coumadin. EXAM: CT HEAD WITHOUT CONTRAST TECHNIQUE: Contiguous axial images were obtained from the base of the skull through the vertex without intravenous contrast. COMPARISON:  06/23/2010 FINDINGS: Mild global atrophy is appropriate to age. Mild chronic ischemic changes in the periventricular white matter. No mass effect, midline shift, or acute intracranial hemorrhage. Mastoid air cells are clear. Visualized paranasal sinuses are clear. Cranium is intact. IMPRESSION: No acute intracranial pathology. Electronically Signed   By: Marybelle Killings M.D.   On: 10/23/2015 19:33   Dg Knee Complete 4 Views Left  10/23/2015  CLINICAL DATA:  Status post fall at 12:30 p.m. today with a left knee injury. Pain. Initial encounter. EXAM: LEFT KNEE - COMPLETE 4+ VIEW COMPARISON:  None. FINDINGS: No acute bony or joint abnormality is identified. Left total knee arthroplasty is in  place. No hardware complication is seen. There is no joint effusion. IMPRESSION: No acute abnormality. Status post left total knee replacement without evidence of complication. Electronically Signed   By: Inge Rise M.D.   On: 10/23/2015 14:44   Dg Knee Complete 4 Views Right  10/23/2015  CLINICAL DATA:  Lt elbow pain medially, pt fell around 12:30 pm today, landed on both knees and elbows, generalized bilaterally knee pain , and generalized rt. Elbow pain, pt complaints of lt rib pain as well, EXAM: RIGHT KNEE - COMPLETE 4+ VIEW COMPARISON:  None. FINDINGS:  LEFT knee arthroplasty.  No evidence of fracture or fusion. IMPRESSION: No acute findings in the RIGHT knee. Electronically Signed   By: Suzy Bouchard M.D.   On: 10/23/2015 14:45   I have personally reviewed and evaluated these images and lab results as part of my medical decision-making.   EKG Interpretation None      MDM   Final diagnoses:  Fall, initial encounter  Rib pain on left side   STEPHANIA SLEPPY presents after a fall just PTA. Patient with history of A. fib currently on Coumadin, therefore CT head was ordered which was negative. No focal neuro deficits on exam. No wounds appreciated.  Bilateral knee and elbow imaging reviewed and negative. CXR with left rib series also reviewed and negative. Return precautions and follow up care discussed with patient. Home care instructions given. All questions answered.   Patient discussed with Dr. Wilson Singer who agrees with treatment plan.   [facility] Timathy Newberry, PA-C 10/23/15 2059  Virgel Manifold, MD 10/30/15 [phone]

## 2015-11-01 ENCOUNTER — Ambulatory Visit (INDEPENDENT_AMBULATORY_CARE_PROVIDER_SITE_OTHER): Payer: Medicare Other | Admitting: *Deleted

## 2015-11-01 DIAGNOSIS — I4891 Unspecified atrial fibrillation: Secondary | ICD-10-CM

## 2015-11-01 DIAGNOSIS — Z5181 Encounter for therapeutic drug level monitoring: Secondary | ICD-10-CM | POA: Diagnosis not present

## 2015-11-01 LAB — POCT INR: INR: 3.3

## 2015-11-29 ENCOUNTER — Ambulatory Visit (INDEPENDENT_AMBULATORY_CARE_PROVIDER_SITE_OTHER): Payer: Medicare Other | Admitting: Pharmacist

## 2015-11-29 DIAGNOSIS — I4891 Unspecified atrial fibrillation: Secondary | ICD-10-CM | POA: Diagnosis not present

## 2015-11-29 DIAGNOSIS — Z5181 Encounter for therapeutic drug level monitoring: Secondary | ICD-10-CM

## 2015-11-29 LAB — POCT INR: INR: 3

## 2015-12-27 ENCOUNTER — Ambulatory Visit (INDEPENDENT_AMBULATORY_CARE_PROVIDER_SITE_OTHER): Payer: Medicare Other | Admitting: *Deleted

## 2015-12-27 DIAGNOSIS — I4891 Unspecified atrial fibrillation: Secondary | ICD-10-CM | POA: Diagnosis not present

## 2015-12-27 DIAGNOSIS — Z5181 Encounter for therapeutic drug level monitoring: Secondary | ICD-10-CM

## 2015-12-27 LAB — POCT INR: INR: 2.7

## 2016-01-16 ENCOUNTER — Other Ambulatory Visit: Payer: Self-pay | Admitting: *Deleted

## 2016-01-16 MED ORDER — WARFARIN SODIUM 5 MG PO TABS: ORAL_TABLET | ORAL | Status: DC

## 2016-01-17 DIAGNOSIS — F0390 Unspecified dementia without behavioral disturbance: Secondary | ICD-10-CM | POA: Diagnosis not present

## 2016-01-17 DIAGNOSIS — R42 Dizziness and giddiness: Secondary | ICD-10-CM | POA: Diagnosis not present

## 2016-01-24 ENCOUNTER — Ambulatory Visit (INDEPENDENT_AMBULATORY_CARE_PROVIDER_SITE_OTHER): Payer: Medicare Other | Admitting: *Deleted

## 2016-01-24 DIAGNOSIS — I4891 Unspecified atrial fibrillation: Secondary | ICD-10-CM | POA: Diagnosis not present

## 2016-01-24 DIAGNOSIS — Z5181 Encounter for therapeutic drug level monitoring: Secondary | ICD-10-CM | POA: Diagnosis not present

## 2016-01-24 LAB — POCT INR: INR: 3.1

## 2016-02-12 DIAGNOSIS — R32 Unspecified urinary incontinence: Secondary | ICD-10-CM | POA: Diagnosis not present

## 2016-02-12 DIAGNOSIS — N390 Urinary tract infection, site not specified: Secondary | ICD-10-CM | POA: Diagnosis not present

## 2016-02-21 ENCOUNTER — Ambulatory Visit (INDEPENDENT_AMBULATORY_CARE_PROVIDER_SITE_OTHER): Payer: Medicare Other | Admitting: *Deleted

## 2016-02-21 DIAGNOSIS — I4891 Unspecified atrial fibrillation: Secondary | ICD-10-CM

## 2016-02-21 DIAGNOSIS — Z5181 Encounter for therapeutic drug level monitoring: Secondary | ICD-10-CM | POA: Diagnosis not present

## 2016-02-21 LAB — POCT INR: INR: 2.6

## 2016-03-04 DIAGNOSIS — Z124 Encounter for screening for malignant neoplasm of cervix: Secondary | ICD-10-CM | POA: Diagnosis not present

## 2016-03-04 DIAGNOSIS — N3941 Urge incontinence: Secondary | ICD-10-CM | POA: Diagnosis not present

## 2016-03-04 DIAGNOSIS — Z1231 Encounter for screening mammogram for malignant neoplasm of breast: Secondary | ICD-10-CM | POA: Diagnosis not present

## 2016-03-20 ENCOUNTER — Ambulatory Visit (INDEPENDENT_AMBULATORY_CARE_PROVIDER_SITE_OTHER): Payer: Medicare Other | Admitting: Pharmacist

## 2016-03-20 DIAGNOSIS — Z5181 Encounter for therapeutic drug level monitoring: Secondary | ICD-10-CM

## 2016-03-20 DIAGNOSIS — I4891 Unspecified atrial fibrillation: Secondary | ICD-10-CM | POA: Diagnosis not present

## 2016-03-20 LAB — POCT INR: INR: 2.9

## 2016-04-17 ENCOUNTER — Ambulatory Visit (INDEPENDENT_AMBULATORY_CARE_PROVIDER_SITE_OTHER): Payer: Medicare Other | Admitting: Pharmacist

## 2016-04-17 DIAGNOSIS — I4891 Unspecified atrial fibrillation: Secondary | ICD-10-CM | POA: Diagnosis not present

## 2016-04-17 DIAGNOSIS — Z5181 Encounter for therapeutic drug level monitoring: Secondary | ICD-10-CM

## 2016-04-17 LAB — POCT INR: INR: 2.9

## 2016-05-01 DIAGNOSIS — Z23 Encounter for immunization: Secondary | ICD-10-CM | POA: Diagnosis not present

## 2016-05-01 DIAGNOSIS — I481 Persistent atrial fibrillation: Secondary | ICD-10-CM | POA: Diagnosis not present

## 2016-05-01 DIAGNOSIS — Z79899 Other long term (current) drug therapy: Secondary | ICD-10-CM | POA: Diagnosis not present

## 2016-05-01 DIAGNOSIS — Z0000 Encounter for general adult medical examination without abnormal findings: Secondary | ICD-10-CM | POA: Diagnosis not present

## 2016-05-01 DIAGNOSIS — N3281 Overactive bladder: Secondary | ICD-10-CM | POA: Diagnosis not present

## 2016-05-01 DIAGNOSIS — E042 Nontoxic multinodular goiter: Secondary | ICD-10-CM | POA: Diagnosis not present

## 2016-05-01 DIAGNOSIS — I10 Essential (primary) hypertension: Secondary | ICD-10-CM | POA: Diagnosis not present

## 2016-05-01 DIAGNOSIS — I272 Other secondary pulmonary hypertension: Secondary | ICD-10-CM | POA: Diagnosis not present

## 2016-05-01 DIAGNOSIS — N183 Chronic kidney disease, stage 3 (moderate): Secondary | ICD-10-CM | POA: Diagnosis not present

## 2016-05-01 DIAGNOSIS — K7581 Nonalcoholic steatohepatitis (NASH): Secondary | ICD-10-CM | POA: Diagnosis not present

## 2016-05-01 DIAGNOSIS — D649 Anemia, unspecified: Secondary | ICD-10-CM | POA: Diagnosis not present

## 2016-05-10 ENCOUNTER — Other Ambulatory Visit: Payer: Self-pay | Admitting: Cardiology

## 2016-05-13 DIAGNOSIS — I83891 Varicose veins of right lower extremities with other complications: Secondary | ICD-10-CM | POA: Diagnosis not present

## 2016-05-13 DIAGNOSIS — I83813 Varicose veins of bilateral lower extremities with pain: Secondary | ICD-10-CM | POA: Diagnosis not present

## 2016-05-19 DIAGNOSIS — H04123 Dry eye syndrome of bilateral lacrimal glands: Secondary | ICD-10-CM | POA: Diagnosis not present

## 2016-05-23 DIAGNOSIS — I83813 Varicose veins of bilateral lower extremities with pain: Secondary | ICD-10-CM | POA: Diagnosis not present

## 2016-05-23 DIAGNOSIS — I83891 Varicose veins of right lower extremities with other complications: Secondary | ICD-10-CM | POA: Diagnosis not present

## 2016-05-23 DIAGNOSIS — I83893 Varicose veins of bilateral lower extremities with other complications: Secondary | ICD-10-CM | POA: Diagnosis not present

## 2016-05-23 DIAGNOSIS — I83811 Varicose veins of right lower extremities with pain: Secondary | ICD-10-CM | POA: Diagnosis not present

## 2016-05-28 DIAGNOSIS — M79672 Pain in left foot: Secondary | ICD-10-CM | POA: Diagnosis not present

## 2016-05-28 DIAGNOSIS — M25561 Pain in right knee: Secondary | ICD-10-CM | POA: Diagnosis not present

## 2016-05-29 ENCOUNTER — Ambulatory Visit (INDEPENDENT_AMBULATORY_CARE_PROVIDER_SITE_OTHER): Payer: Medicare Other

## 2016-05-29 DIAGNOSIS — I4891 Unspecified atrial fibrillation: Secondary | ICD-10-CM

## 2016-05-29 DIAGNOSIS — Z5181 Encounter for therapeutic drug level monitoring: Secondary | ICD-10-CM | POA: Diagnosis not present

## 2016-05-29 LAB — POCT INR: INR: 2.5

## 2016-06-09 DIAGNOSIS — I83891 Varicose veins of right lower extremities with other complications: Secondary | ICD-10-CM | POA: Diagnosis not present

## 2016-06-09 DIAGNOSIS — I83813 Varicose veins of bilateral lower extremities with pain: Secondary | ICD-10-CM | POA: Diagnosis not present

## 2016-06-13 DIAGNOSIS — I83891 Varicose veins of right lower extremities with other complications: Secondary | ICD-10-CM | POA: Diagnosis not present

## 2016-06-13 DIAGNOSIS — I8312 Varicose veins of left lower extremity with inflammation: Secondary | ICD-10-CM | POA: Diagnosis not present

## 2016-07-08 DIAGNOSIS — I83891 Varicose veins of right lower extremities with other complications: Secondary | ICD-10-CM | POA: Diagnosis not present

## 2016-07-08 DIAGNOSIS — I83811 Varicose veins of right lower extremities with pain: Secondary | ICD-10-CM | POA: Diagnosis not present

## 2016-07-16 ENCOUNTER — Ambulatory Visit (INDEPENDENT_AMBULATORY_CARE_PROVIDER_SITE_OTHER): Payer: Medicare Other | Admitting: *Deleted

## 2016-07-16 DIAGNOSIS — Z5181 Encounter for therapeutic drug level monitoring: Secondary | ICD-10-CM | POA: Diagnosis not present

## 2016-07-16 DIAGNOSIS — I4891 Unspecified atrial fibrillation: Secondary | ICD-10-CM

## 2016-07-16 LAB — POCT INR: INR: 2

## 2016-08-05 DIAGNOSIS — N183 Chronic kidney disease, stage 3 (moderate): Secondary | ICD-10-CM | POA: Diagnosis not present

## 2016-08-28 ENCOUNTER — Ambulatory Visit (INDEPENDENT_AMBULATORY_CARE_PROVIDER_SITE_OTHER): Payer: Medicare HMO | Admitting: *Deleted

## 2016-08-28 ENCOUNTER — Encounter (INDEPENDENT_AMBULATORY_CARE_PROVIDER_SITE_OTHER): Payer: Self-pay

## 2016-08-28 DIAGNOSIS — Z5181 Encounter for therapeutic drug level monitoring: Secondary | ICD-10-CM | POA: Diagnosis not present

## 2016-08-28 DIAGNOSIS — I4891 Unspecified atrial fibrillation: Secondary | ICD-10-CM | POA: Diagnosis not present

## 2016-08-28 LAB — POCT INR: INR: 1.9

## 2016-09-03 ENCOUNTER — Encounter: Payer: Self-pay | Admitting: Cardiology

## 2016-09-03 ENCOUNTER — Ambulatory Visit (INDEPENDENT_AMBULATORY_CARE_PROVIDER_SITE_OTHER): Payer: Medicare HMO | Admitting: Cardiology

## 2016-09-03 VITALS — BP 110/60 | HR 72 | Ht 65.0 in | Wt 150.0 lb

## 2016-09-03 DIAGNOSIS — I482 Chronic atrial fibrillation: Secondary | ICD-10-CM | POA: Diagnosis not present

## 2016-09-03 DIAGNOSIS — R600 Localized edema: Secondary | ICD-10-CM | POA: Diagnosis not present

## 2016-09-03 DIAGNOSIS — I10 Essential (primary) hypertension: Secondary | ICD-10-CM | POA: Diagnosis not present

## 2016-09-03 DIAGNOSIS — I4821 Permanent atrial fibrillation: Secondary | ICD-10-CM

## 2016-09-03 HISTORY — DX: Localized edema: R60.0

## 2016-09-03 LAB — BASIC METABOLIC PANEL
BUN/Creatinine Ratio: 17 (ref 12–28)
BUN: 23 mg/dL (ref 8–27)
CO2: 24 mmol/L (ref 18–29)
Calcium: 10.2 mg/dL (ref 8.7–10.3)
Chloride: 102 mmol/L (ref 96–106)
Creatinine, Ser: 1.32 mg/dL — ABNORMAL HIGH (ref 0.57–1.00)
GFR calc Af Amer: 44 mL/min/{1.73_m2} — ABNORMAL LOW (ref >59)
GFR calc non Af Amer: 38 mL/min/{1.73_m2} — ABNORMAL LOW (ref >59)
Glucose: 101 mg/dL — ABNORMAL HIGH (ref 65–99)
Potassium: 4.1 mmol/L (ref 3.5–5.2)
Sodium: 142 mmol/L (ref 134–144)

## 2016-09-03 MED ORDER — AMLODIPINE BESYLATE 5 MG PO TABS: 5.0000 mg | ORAL_TABLET | Freq: Every day | ORAL | 11 refills | Status: DC

## 2016-09-03 NOTE — Progress Notes (Signed)
Cardiology Office Note    Date:  09/03/2016   ID:  Mallory Stephens, DOB [birth year], MRN [id number]  PCP:  Lilian Coma, MD  Cardiologist:  Fransico Him, MD   Chief Complaint  Patient presents with  . Atrial Fibrillation  . Hypertension    History of Present Illness:  Mallory Stephens is a 83 y.o. female with a history of chronic atrial fibrillation and HTN who presents today for followup. She is doing well. She denies any chest pain or pressure, palpitations, dizziness or syncope.  She denies any SOB or DOE. She has chronic LE edema mainly in her ankles at the end of the day.     Past Medical History:  Diagnosis Date  . Dyslipidemia   . Edema extremities 09/03/2016  . Fatty liver   . GERD (gastroesophageal reflux disease)   . Hypertension   . Lymphocytosis    followed by Dr. Julien Nordmann  . Multinodular goiter   . Osteoarthritis   . Permanent atrial fibrillation (HCC)    atrial fibrillation s/p DCCV 3/10 and 6/10  . PONV (postoperative nausea and vomiting)   . PUD (peptic ulcer disease)   . Pulmonary nodules    followed  by Dr. Stephanie Acre    Past Surgical History:  Procedure Laterality Date  . aspiration of cyst  1978  . [place]   right breast for benign lesion  . BREAST SURGERY  1971   bilateral lumpectomy for beingn lesions  . CARDIOVERSION  07/22/2011   Procedure: CARDIOVERSION;  Surgeon: [name], MD;  Location: [place];  Service: Cardiovascular;  Laterality: N/A;  . CARDIOVERSION  09/02/2011   Procedure: CARDIOVERSION;  Surgeon: [name], MD;  Location: [place];  Service: Cardiovascular;  Laterality: N/A;  . CHOLECYSTECTOMY  2003  . CYSTOSCOPY    . DILATION AND CURETTAGE OF UTERUS  1979  . JOINT REPLACEMENT  2002   Right knee  . JOINT REPLACEMENT  2007   left knee    Current Medications: Outpatient Medications Prior to Visit  Medication Sig Dispense Refill  . acetaminophen (TYLENOL) 325 MG tablet Take 325 mg by mouth every 6  (six) hours as needed for moderate pain.    Marland Kitchen amLODipine (NORVASC) 10 MG tablet Take 10 mg by mouth daily.     . cholecalciferol (VITAMIN D) 1000 UNITS tablet Take 1,000 Units by mouth 3 (three) times a week. Patient takes occasionally.    Marland Kitchen losartan (COZAAR) 100 MG tablet TAKE 1 TABLET BY MOUTH EVERY DAY 90 tablet 1  . omega-3 acid ethyl esters (LOVAZA) 1 G capsule Take 1 g by mouth 2 (two) times daily.     Marland Kitchen PARoxetine (PAXIL) 10 MG tablet Take 10 mg by mouth every morning.     . polyvinyl alcohol (LIQUIFILM TEARS) 1.4 % ophthalmic solution Place 1-2 drops into both eyes daily as needed for dry eyes.    . pravastatin (PRAVACHOL) 20 MG tablet Take 20 mg by mouth at bedtime.     . ranitidine (ZANTAC) 300 MG tablet Take 300 mg by mouth at bedtime.     . solifenacin (VESICARE) 5 MG tablet Take 5 mg by mouth daily.    Marland Kitchen warfarin (COUMADIN) 5 MG tablet TAKE AS DIRECTED BY COUMADIN CLINIC 90 tablet 0  . metoprolol succinate (TOPROL XL) 25 MG 24 hr tablet Take 1 tablet (25 mg total) by mouth daily. 30 tablet 11   No facility-administered medications prior to visit.  Allergies:   Sulfa drugs cross reactors   Social History   Social History  . Marital status: Widowed    Spouse name: N/A  . Number of children: N/A  . Years of education: N/A   Social History Main Topics  . Smoking status: Former Smoker    Quit date: 07/16/1989  . Smokeless tobacco: Never Used  . Alcohol use Yes     Comment: occasional wine  . Drug use: No  . Sexual activity: Not Asked   Other Topics Concern  . None   Social History Narrative  . None     Family History:  The patient's family history includes Arthritis in her sister; Breast cancer in her sister; Heart disease in her brother; Prostate cancer in her brother and brother; Scoliosis in her sister; Stroke in her father and mother.   ROS:   Please see the history of present illness.    ROS All other systems reviewed and are negative.  No flowsheet  data found.     PHYSICAL EXAM:   VS:  BP 110/60 (BP Location: Right Arm, Patient Position: Sitting, Cuff Size: Normal)   Pulse 72   Ht 5\' 5"  (1.651 m)   Wt 150 lb (68 kg)   BMI 24.96 kg/m    GEN: Well nourished, well developed, in no acute distress  HEENT: normal  Neck: no JVD, carotid bruits, or masses Cardiac: irregularly irregular; no murmurs, rubs, or gallops,no edema.  Intact distal pulses bilaterally.  Respiratory:  clear to auscultation bilaterally, normal work of breathing GI: soft, nontender, nondistended, + BS MS: no deformity or atrophy  Skin: warm and dry, no rash Neuro:  Alert and Oriented x 3, Strength and sensation are intact Psych: euthymic mood, full affect  Wt Readings from Last 3 Encounters:  09/03/16 150 lb (68 kg)  10/23/15 150 lb (68 kg)  08/09/15 150 lb 12.8 oz (68.4 kg)      Studies/Labs Reviewed:   EKG:  EKG is ordered today.  The ekg ordered today demonstrates atrial fibrillation with HR 83bpm and septal infarct.  Recent Labs: No results found for requested labs within last 8760 hours.   Lipid Panel    Component Value Date/Time   CHOL 150 02/16/2014 0752   TRIG 112.0 02/16/2014 0752   HDL 45.20 02/16/2014 0752   CHOLHDL 3 02/16/2014 0752   VLDL 22.4 02/16/2014 0752   LDLCALC 82 02/16/2014 0752    Additional studies/ records that were reviewed today include:  none    ASSESSMENT:    1. Permanent atrial fibrillation ([place])   2. Essential hypertension, benign   3. Edema extremities      PLAN:  In order of problems listed above:  1. Persistent atrial fibrillation rate controlled.  She will continue on BB and warfarin. 2. HTN - BP well controlled on current meds but she is having LE edema and this could be due to her amlodipine. I have instructed her to decrease her amlodipine to 5mg  daily and follow a no added salt diet.  She will continue on BB and ARB.  Check BMET. I have asked her to check her BP 2 hours after her meds daily for  a week and call with results.  3. LE edema - ? Whether this is related to higher dose of amlodipine.  This only occurs at the end of the day.  See #2.       Medication Adjustments/Labs and Tests Ordered: Current medicines are reviewed at length with  the patient today.  Concerns regarding medicines are outlined above.  Medication changes, Labs and Tests ordered today are listed in the Patient Instructions below.  There are no Patient Instructions on file for this visit.   Signed, Fransico Him, MD  09/03/2016 11:18 AM    [address], [place], [place]  [address] Phone: [phone]; Fax: [phone]

## 2016-09-03 NOTE — Patient Instructions (Signed)
Medication Instructions:  1) DECREASE AMLODIPINE to 5 mg daily  Labwork: TODAY: BMET  Testing/Procedures: None  Follow-Up: Your physician wants you to follow-up in: 6 months with Dr. Radford Pax. You will receive a reminder letter in the mail two months in advance. If you don't receive a letter, please call our office to schedule the follow-up appointment.   Any Other Special Instructions Will Be Listed Below (If Applicable). Please check your blood pressure for 1 week 2 hours after you take your medication and call with results.    If you need a refill on your cardiac medications before your next appointment, please call your pharmacy.

## 2016-09-10 NOTE — Addendum Note (Signed)
Addended by: Harland German A on: 09/10/2016 08:33 AM   Modules accepted: Orders

## 2016-09-16 ENCOUNTER — Telehealth: Payer: Self-pay | Admitting: Cardiology

## 2016-09-16 NOTE — Telephone Encounter (Signed)
Pt calling regarding BP-med was adjusted and wants to speak to [facility]

## 2016-09-16 NOTE — Telephone Encounter (Signed)
Left message to call back  

## 2016-09-17 ENCOUNTER — Encounter: Payer: Self-pay | Admitting: Cardiology

## 2016-09-17 NOTE — Telephone Encounter (Signed)
Informed patient that BP is well controlled. She states she only has slight dizziness when moving from lying to standing position.  Instructed her to move slowly from lying to standing and to sit for a few minutes in the middle before standing upright. She agrees with treatment plan and was grateful for call and suggestions.

## 2016-09-17 NOTE — Telephone Encounter (Signed)
Mallory Stephens [id number]  [name] 3 hours ago (8:55 AM)    [name] 3 hours ago (8:53 AM)     New Message     Pt c/o BP issue: STAT if pt c/o blurred vision, one-sided weakness or slurred speech  1. What are your last 5 BP readings? 126/59 2/3, 2/4 114/58, 2/5 112/64, 2/6 110/62, 2/7 124/72, 2/8 115/82, 2/9 126/57   2. Are you having any other symptoms (ex. Dizziness, headache, blurred vision, passed out)? dizziness  3. What is your BP issue? She is just reporting her bp reading , swelling in her ankles has gone done         Left message to call back.

## 2016-09-17 NOTE — Telephone Encounter (Signed)
This encounter was created in error - please disregard.

## 2016-09-17 NOTE — Telephone Encounter (Signed)
New Message     Pt c/o BP issue: STAT if pt c/o blurred vision, one-sided weakness or slurred speech  1. What are your last 5 BP readings? 126/59 2/3, 2/4 114/58, 2/5 112/64, 2/6 110/62, 2/7 124/72, 2/8 115/82, 2/9 126/57   2. Are you having any other symptoms (ex. Dizziness, headache, blurred vision, passed out)? dizziness  3. What is your BP issue? She is just reporting her bp reading , swelling in her ankles has gone done

## 2016-09-17 NOTE — Telephone Encounter (Signed)
Follow up ° ° ° ° ° °Returning a call to the nurse °

## 2016-09-18 ENCOUNTER — Ambulatory Visit (INDEPENDENT_AMBULATORY_CARE_PROVIDER_SITE_OTHER): Payer: Medicare HMO

## 2016-09-18 DIAGNOSIS — I4891 Unspecified atrial fibrillation: Secondary | ICD-10-CM | POA: Diagnosis not present

## 2016-09-18 DIAGNOSIS — Z5181 Encounter for therapeutic drug level monitoring: Secondary | ICD-10-CM

## 2016-09-18 LAB — POCT INR: INR: 2.4

## 2016-10-16 ENCOUNTER — Ambulatory Visit (INDEPENDENT_AMBULATORY_CARE_PROVIDER_SITE_OTHER): Payer: Medicare HMO | Admitting: Pharmacist

## 2016-10-16 DIAGNOSIS — Z5181 Encounter for therapeutic drug level monitoring: Secondary | ICD-10-CM

## 2016-10-16 DIAGNOSIS — I4891 Unspecified atrial fibrillation: Secondary | ICD-10-CM

## 2016-10-16 LAB — POCT INR: INR: 2.9

## 2016-10-22 IMAGING — CR DG ELBOW COMPLETE 3+V*L*
4 series · 4 of 4 positions shown · non-contrast
Comparison: None.

CLINICAL DATA: Status post fall at [DATE] p.m. today with a left
elbow injury. Pain. Initial encounter.

EXAM:
LEFT ELBOW - COMPLETE 3+ VIEW

[x elbow ap left]
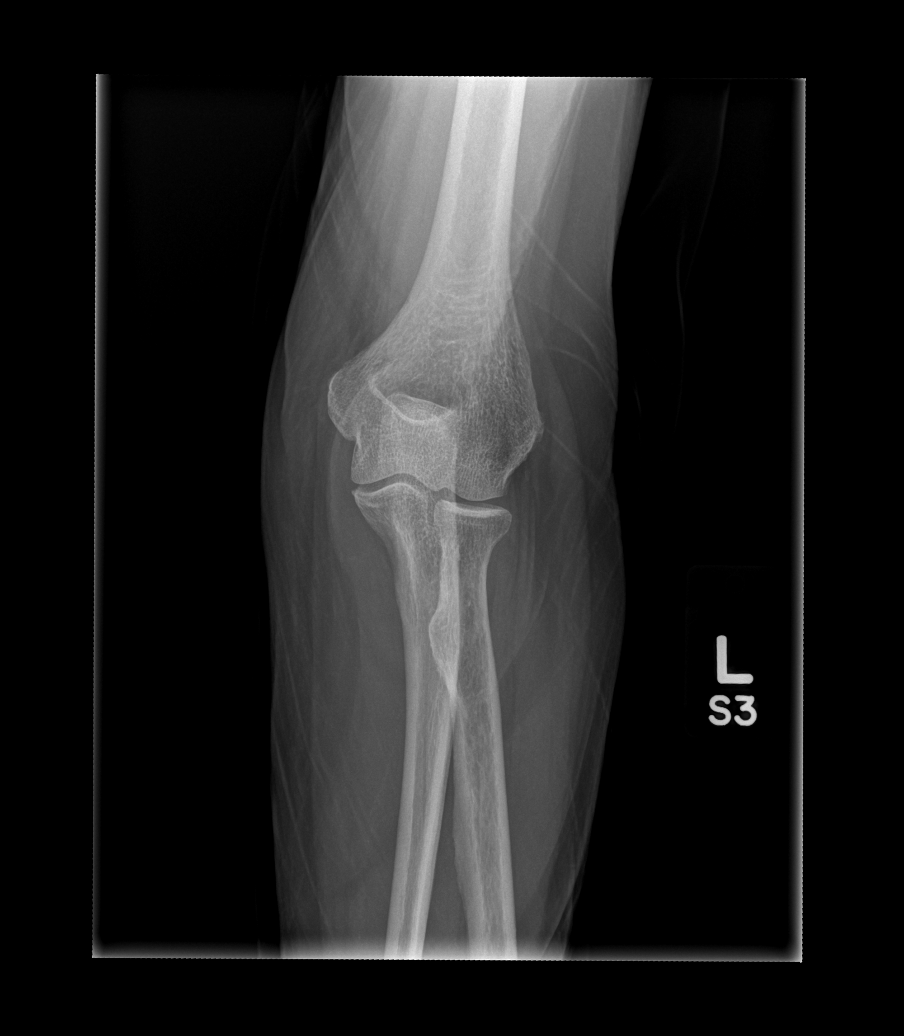

[x elbow obl left (1 of 2)]
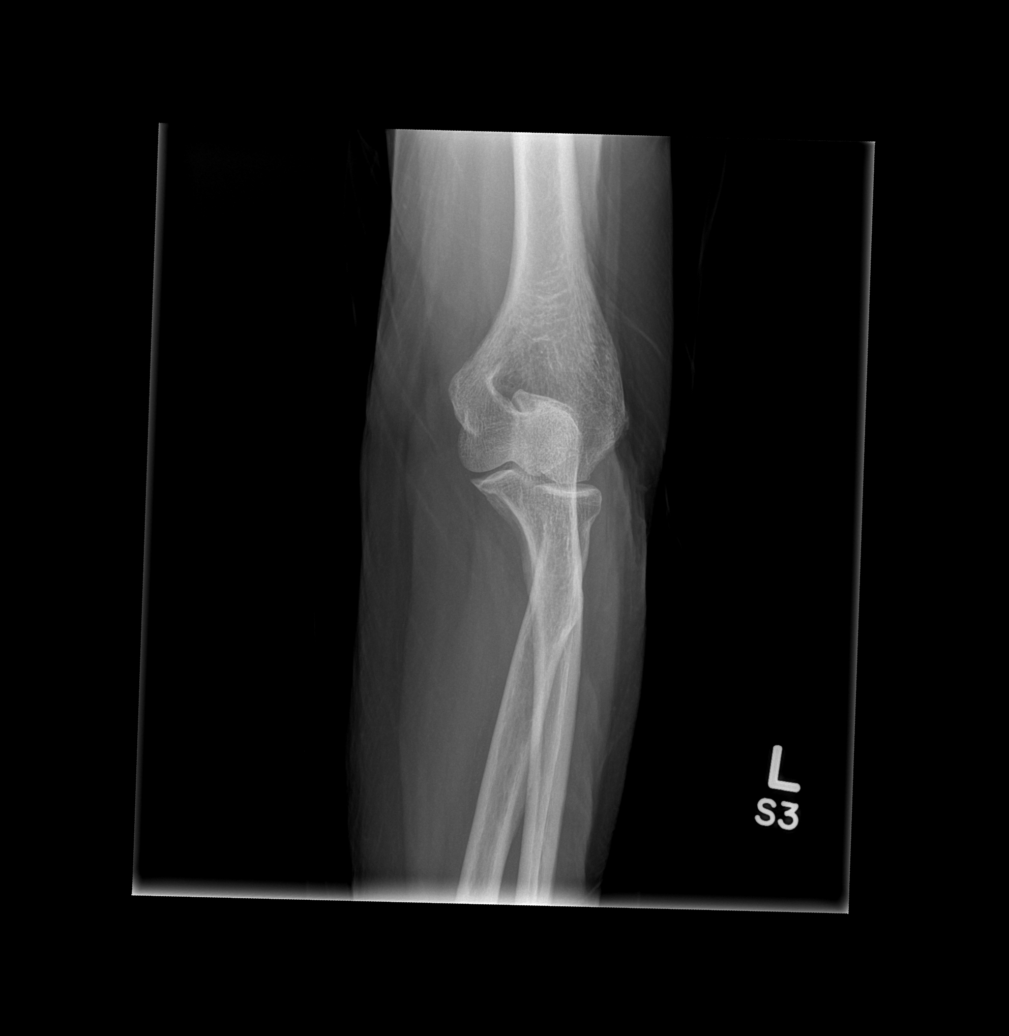

[x elbow obl left (2 of 2)]
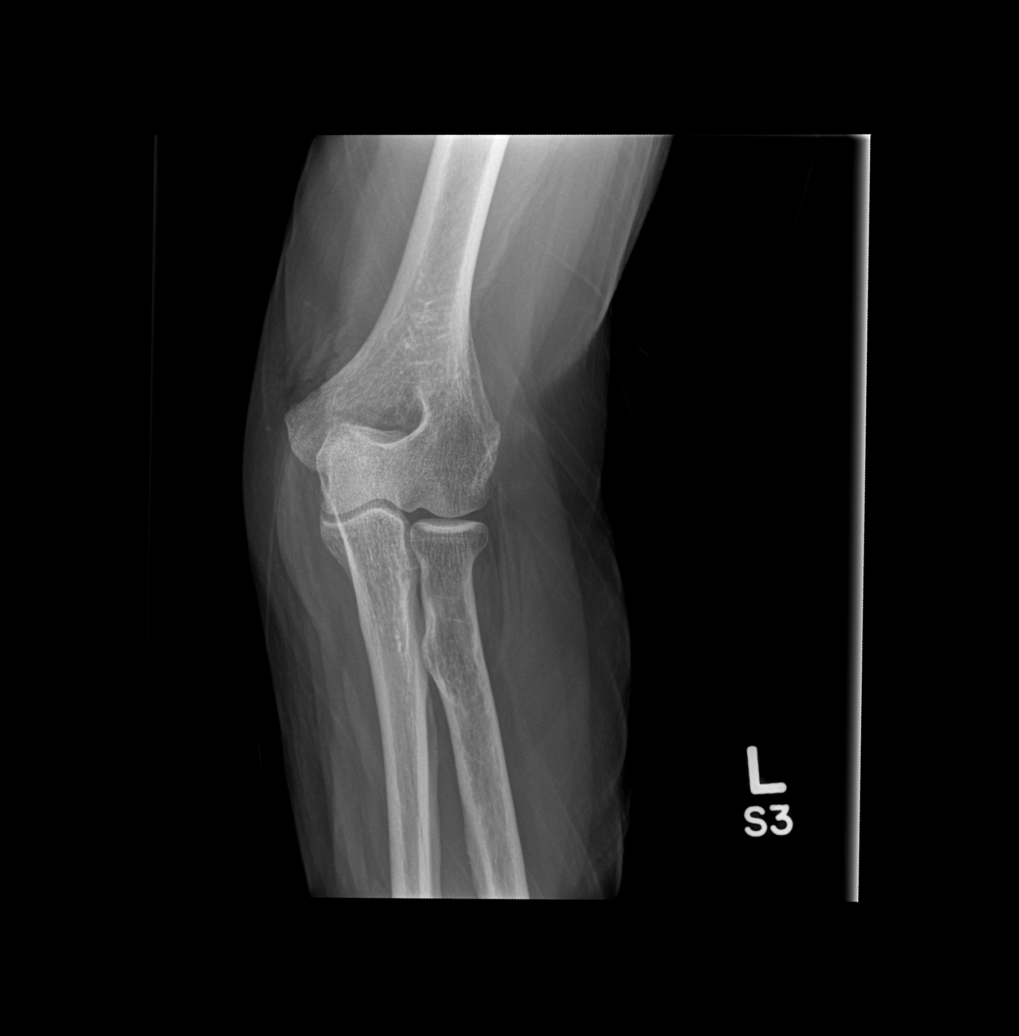

[x elbow lat left]
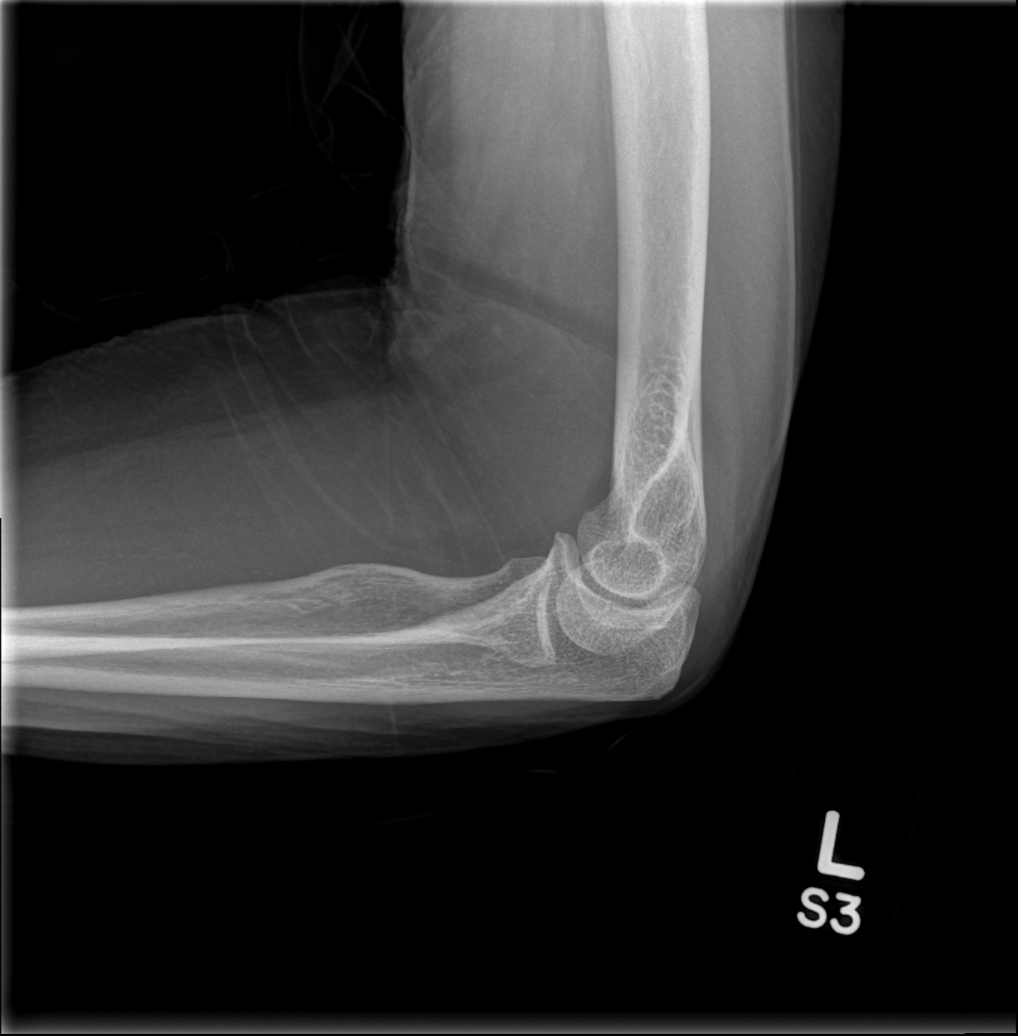

[4 of 4 positions shown; findings below may reference images not displayed]

FINDINGS: There is no evidence of fracture, dislocation, or joint effusion.
There is no evidence of arthropathy or other focal bone abnormality.
Soft tissues are unremarkable.
IMPRESSION: Negative exam.

## 2016-10-22 IMAGING — CR DG RIBS W/ CHEST 3+V*L*
4 series · 4 of 4 positions shown · non-contrast
Comparison: 04/27/2014

CLINICAL DATA: Left anterior rib pain after falling at home today.
Patient tripped over dog.

EXAM:
LEFT RIBS AND CHEST - 3+ VIEW

[w chest pa]
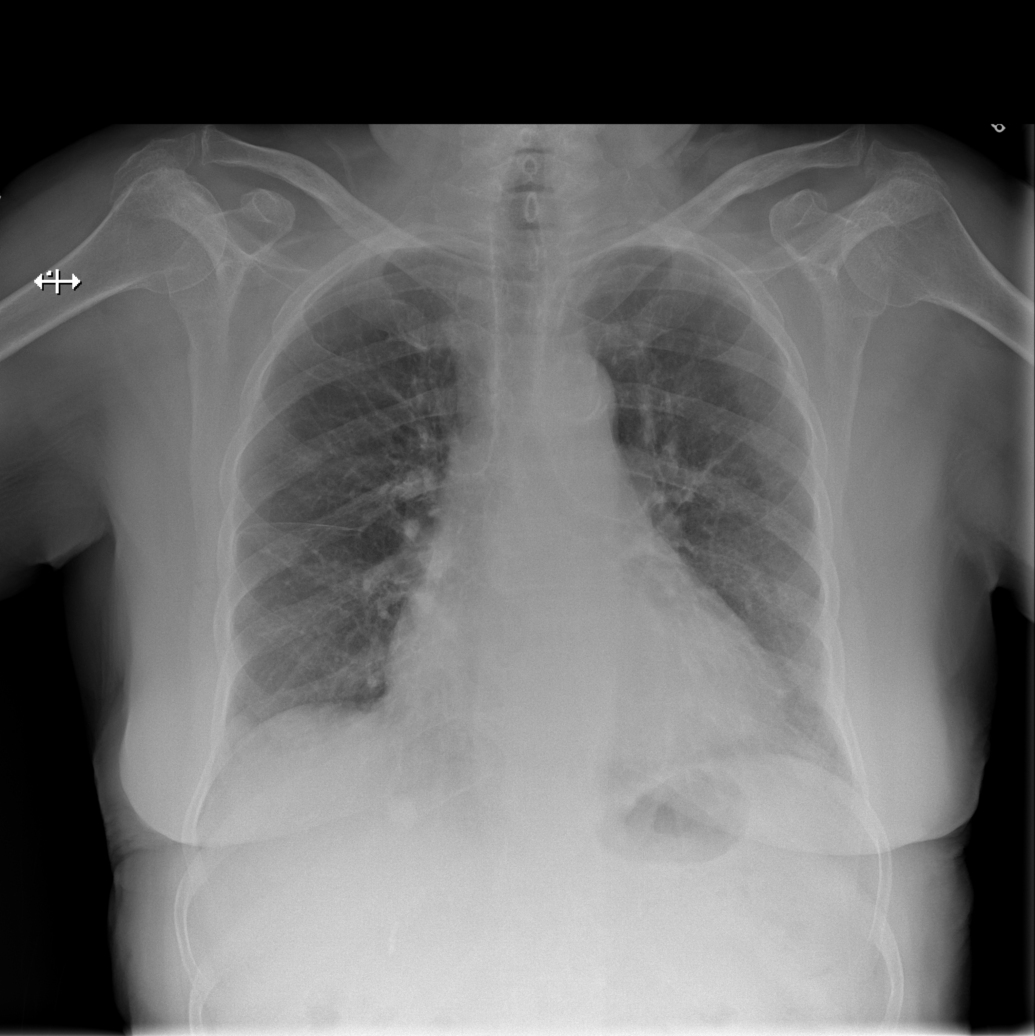

[w ribs ap upper left]
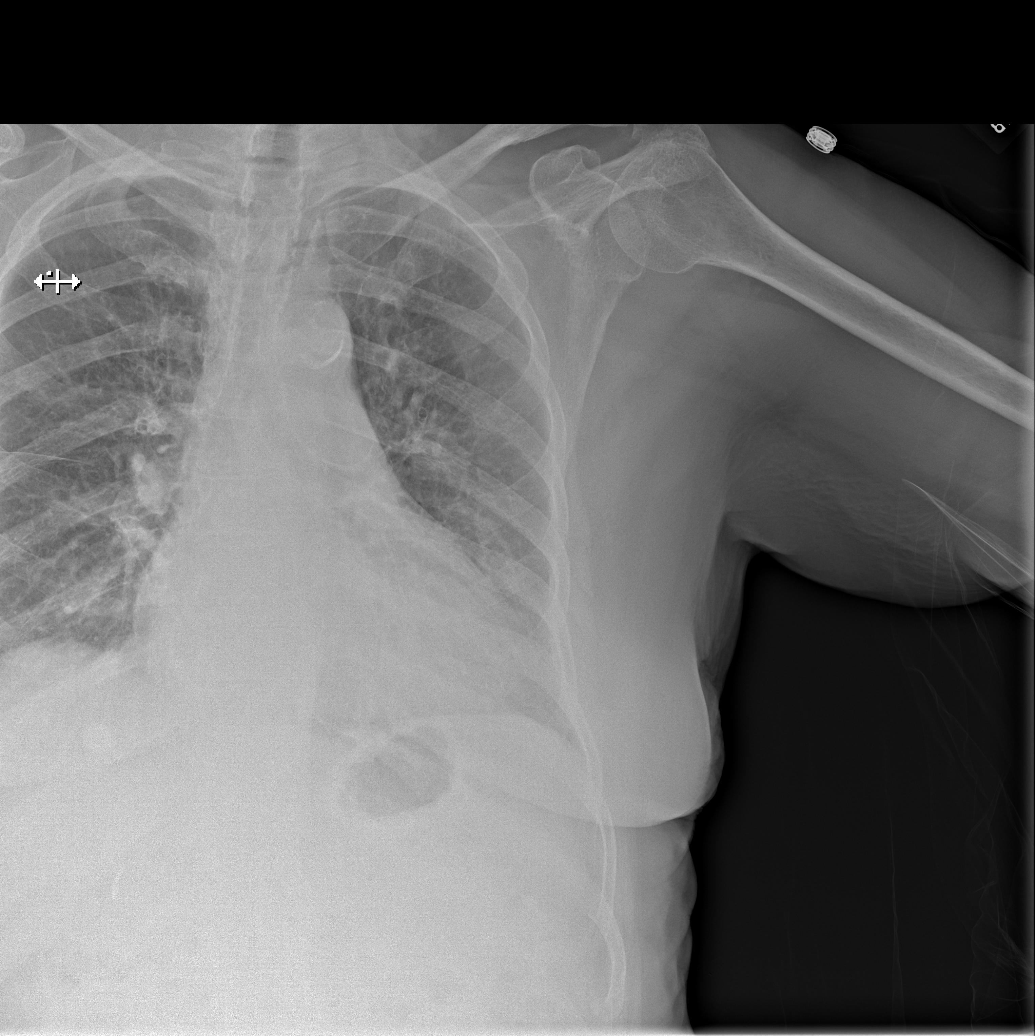

[w ribs ap lower left]
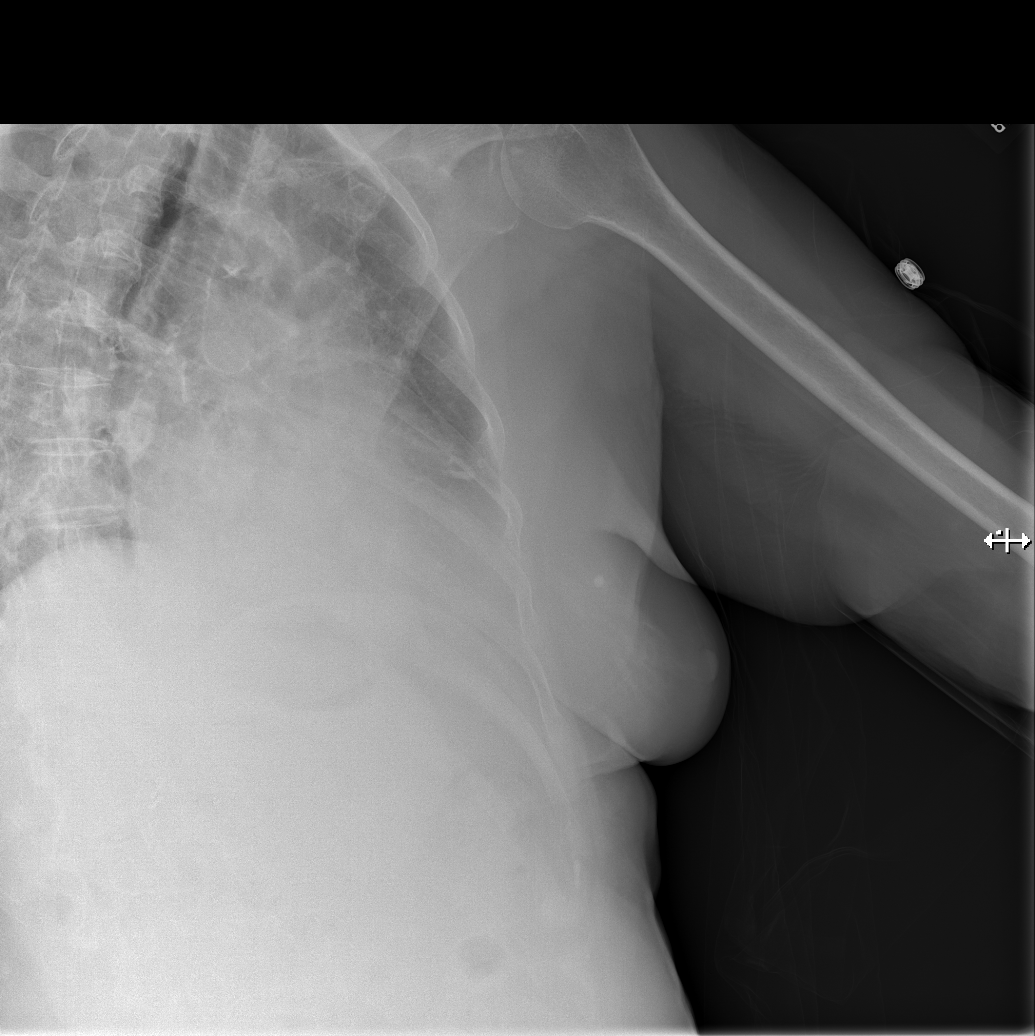

[w ribs obl left]
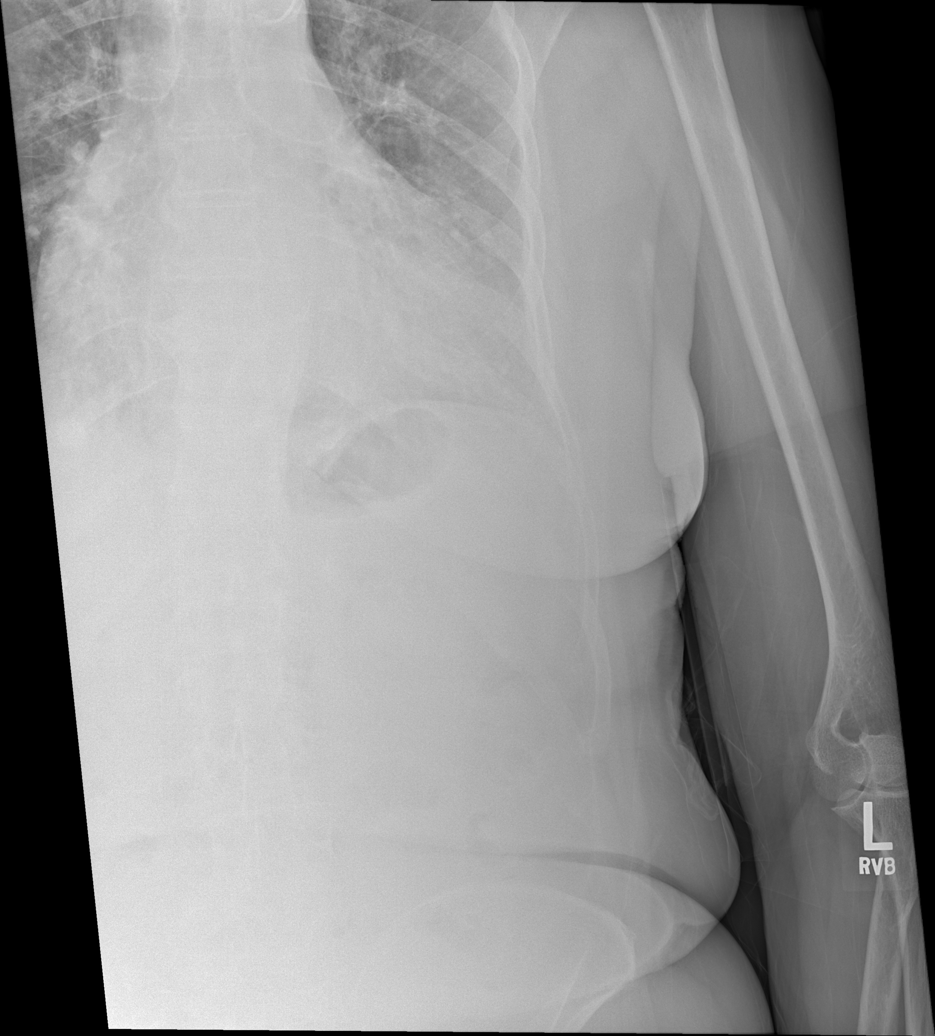

[4 of 4 positions shown; findings below may reference images not displayed]

FINDINGS: The heart is mildly enlarged but stable. There is mild tortuosity
and calcification of the thoracic aorta. There are chronic
bronchitic type lung changes but no definite acute overlying
pulmonary process. No pleural effusion or pneumothorax.

Dedicated views of the left ribs do not demonstrate any definite
acute rib fractures.
IMPRESSION: No acute cardiopulmonary findings.

No definite acute left-sided rib fractures.

## 2016-10-25 ENCOUNTER — Other Ambulatory Visit: Payer: Self-pay | Admitting: Cardiology

## 2016-11-11 ENCOUNTER — Other Ambulatory Visit: Payer: Self-pay | Admitting: *Deleted

## 2016-11-11 MED ORDER — AMLODIPINE BESYLATE 5 MG PO TABS: 5.0000 mg | ORAL_TABLET | Freq: Every day | ORAL | 2 refills | Status: DC

## 2016-11-13 DIAGNOSIS — I83893 Varicose veins of bilateral lower extremities with other complications: Secondary | ICD-10-CM | POA: Diagnosis not present

## 2016-11-20 ENCOUNTER — Ambulatory Visit (INDEPENDENT_AMBULATORY_CARE_PROVIDER_SITE_OTHER): Payer: Medicare HMO | Admitting: *Deleted

## 2016-11-20 DIAGNOSIS — Z5181 Encounter for therapeutic drug level monitoring: Secondary | ICD-10-CM | POA: Diagnosis not present

## 2016-11-20 DIAGNOSIS — I4891 Unspecified atrial fibrillation: Secondary | ICD-10-CM

## 2016-11-20 LAB — POCT INR: INR: 2.2

## 2016-12-05 DIAGNOSIS — M533 Sacrococcygeal disorders, not elsewhere classified: Secondary | ICD-10-CM | POA: Diagnosis not present

## 2016-12-16 DIAGNOSIS — M533 Sacrococcygeal disorders, not elsewhere classified: Secondary | ICD-10-CM | POA: Diagnosis not present

## 2016-12-24 DIAGNOSIS — L820 Inflamed seborrheic keratosis: Secondary | ICD-10-CM | POA: Diagnosis not present

## 2016-12-24 DIAGNOSIS — L570 Actinic keratosis: Secondary | ICD-10-CM | POA: Diagnosis not present

## 2016-12-24 DIAGNOSIS — D225 Melanocytic nevi of trunk: Secondary | ICD-10-CM | POA: Diagnosis not present

## 2016-12-24 DIAGNOSIS — Z85828 Personal history of other malignant neoplasm of skin: Secondary | ICD-10-CM | POA: Diagnosis not present

## 2016-12-24 DIAGNOSIS — L821 Other seborrheic keratosis: Secondary | ICD-10-CM | POA: Diagnosis not present

## 2017-01-01 ENCOUNTER — Ambulatory Visit (INDEPENDENT_AMBULATORY_CARE_PROVIDER_SITE_OTHER): Payer: Medicare HMO | Admitting: *Deleted

## 2017-01-01 DIAGNOSIS — Z5181 Encounter for therapeutic drug level monitoring: Secondary | ICD-10-CM | POA: Diagnosis not present

## 2017-01-01 DIAGNOSIS — I4891 Unspecified atrial fibrillation: Secondary | ICD-10-CM

## 2017-01-01 LAB — POCT INR: INR: 1.7

## 2017-01-07 DIAGNOSIS — R29898 Other symptoms and signs involving the musculoskeletal system: Secondary | ICD-10-CM | POA: Diagnosis not present

## 2017-01-20 DIAGNOSIS — M545 Low back pain: Secondary | ICD-10-CM | POA: Diagnosis not present

## 2017-01-23 ENCOUNTER — Other Ambulatory Visit: Payer: Self-pay | Admitting: Cardiology

## 2017-02-09 DIAGNOSIS — M48061 Spinal stenosis, lumbar region without neurogenic claudication: Secondary | ICD-10-CM | POA: Diagnosis not present

## 2017-02-17 DIAGNOSIS — M545 Low back pain: Secondary | ICD-10-CM | POA: Diagnosis not present

## 2017-02-20 DIAGNOSIS — M545 Low back pain: Secondary | ICD-10-CM | POA: Diagnosis not present

## 2017-02-24 DIAGNOSIS — M545 Low back pain: Secondary | ICD-10-CM | POA: Diagnosis not present

## 2017-02-26 DIAGNOSIS — M545 Low back pain: Secondary | ICD-10-CM | POA: Diagnosis not present

## 2017-03-03 DIAGNOSIS — M545 Low back pain: Secondary | ICD-10-CM | POA: Diagnosis not present

## 2017-03-05 ENCOUNTER — Ambulatory Visit (INDEPENDENT_AMBULATORY_CARE_PROVIDER_SITE_OTHER): Payer: Medicare HMO | Admitting: *Deleted

## 2017-03-05 DIAGNOSIS — Z5181 Encounter for therapeutic drug level monitoring: Secondary | ICD-10-CM

## 2017-03-05 DIAGNOSIS — I4891 Unspecified atrial fibrillation: Secondary | ICD-10-CM

## 2017-03-05 DIAGNOSIS — M545 Low back pain: Secondary | ICD-10-CM | POA: Diagnosis not present

## 2017-03-05 LAB — POCT INR: INR: 2.4

## 2017-03-09 DIAGNOSIS — Z1231 Encounter for screening mammogram for malignant neoplasm of breast: Secondary | ICD-10-CM | POA: Diagnosis not present

## 2017-03-09 DIAGNOSIS — Z124 Encounter for screening for malignant neoplasm of cervix: Secondary | ICD-10-CM | POA: Diagnosis not present

## 2017-03-10 DIAGNOSIS — M545 Low back pain: Secondary | ICD-10-CM | POA: Diagnosis not present

## 2017-03-12 DIAGNOSIS — M545 Low back pain: Secondary | ICD-10-CM | POA: Diagnosis not present

## 2017-03-16 DIAGNOSIS — M79644 Pain in right finger(s): Secondary | ICD-10-CM | POA: Diagnosis not present

## 2017-03-17 DIAGNOSIS — M545 Low back pain: Secondary | ICD-10-CM | POA: Diagnosis not present

## 2017-03-19 DIAGNOSIS — M545 Low back pain: Secondary | ICD-10-CM | POA: Diagnosis not present

## 2017-03-23 DIAGNOSIS — M79644 Pain in right finger(s): Secondary | ICD-10-CM | POA: Diagnosis not present

## 2017-04-04 DIAGNOSIS — N390 Urinary tract infection, site not specified: Secondary | ICD-10-CM

## 2017-04-04 HISTORY — DX: Urinary tract infection, site not specified: N39.0

## 2017-04-08 DIAGNOSIS — M79644 Pain in right finger(s): Secondary | ICD-10-CM | POA: Diagnosis not present

## 2017-04-20 ENCOUNTER — Ambulatory Visit: Payer: Medicare HMO | Admitting: Cardiology

## 2017-04-21 ENCOUNTER — Encounter: Payer: Self-pay | Admitting: Cardiology

## 2017-04-22 DIAGNOSIS — M79644 Pain in right finger(s): Secondary | ICD-10-CM | POA: Diagnosis not present

## 2017-04-28 ENCOUNTER — Encounter (HOSPITAL_COMMUNITY): Payer: Self-pay | Admitting: Physician Assistant

## 2017-04-28 ENCOUNTER — Observation Stay (HOSPITAL_COMMUNITY)
Admission: EM | Admit: 2017-04-28 | Discharge: 2017-04-30 | Disposition: A | Discharge status: Home / Self Care | Payer: Medicare HMO | Attending: Internal Medicine | Admitting: Internal Medicine

## 2017-04-28 ENCOUNTER — Emergency Department (HOSPITAL_COMMUNITY): Payer: Medicare HMO

## 2017-04-28 ENCOUNTER — Observation Stay (HOSPITAL_COMMUNITY): Payer: Medicare HMO

## 2017-04-28 DIAGNOSIS — Z882 Allergy status to sulfonamides status: Secondary | ICD-10-CM | POA: Insufficient documentation

## 2017-04-28 DIAGNOSIS — K5732 Diverticulitis of large intestine without perforation or abscess without bleeding: Secondary | ICD-10-CM | POA: Insufficient documentation

## 2017-04-28 DIAGNOSIS — E876 Hypokalemia: Secondary | ICD-10-CM | POA: Insufficient documentation

## 2017-04-28 DIAGNOSIS — N390 Urinary tract infection, site not specified: Secondary | ICD-10-CM

## 2017-04-28 DIAGNOSIS — K760 Fatty (change of) liver, not elsewhere classified: Secondary | ICD-10-CM | POA: Insufficient documentation

## 2017-04-28 DIAGNOSIS — Y998 Other external cause status: Secondary | ICD-10-CM | POA: Diagnosis not present

## 2017-04-28 DIAGNOSIS — E785 Hyperlipidemia, unspecified: Secondary | ICD-10-CM | POA: Insufficient documentation

## 2017-04-28 DIAGNOSIS — N179 Acute kidney failure, unspecified: Secondary | ICD-10-CM | POA: Diagnosis not present

## 2017-04-28 DIAGNOSIS — D696 Thrombocytopenia, unspecified: Secondary | ICD-10-CM | POA: Diagnosis not present

## 2017-04-28 DIAGNOSIS — R531 Weakness: Secondary | ICD-10-CM

## 2017-04-28 DIAGNOSIS — R319 Hematuria, unspecified: Secondary | ICD-10-CM | POA: Diagnosis not present

## 2017-04-28 DIAGNOSIS — I088 Other rheumatic multiple valve diseases: Secondary | ICD-10-CM | POA: Insufficient documentation

## 2017-04-28 DIAGNOSIS — G3189 Other specified degenerative diseases of nervous system: Secondary | ICD-10-CM | POA: Insufficient documentation

## 2017-04-28 DIAGNOSIS — Y92009 Unspecified place in unspecified non-institutional (private) residence as the place of occurrence of the external cause: Secondary | ICD-10-CM | POA: Insufficient documentation

## 2017-04-28 DIAGNOSIS — B9689 Other specified bacterial agents as the cause of diseases classified elsewhere: Secondary | ICD-10-CM | POA: Diagnosis not present

## 2017-04-28 DIAGNOSIS — W01198A Fall on same level from slipping, tripping and stumbling with subsequent striking against other object, initial encounter: Secondary | ICD-10-CM | POA: Insufficient documentation

## 2017-04-28 DIAGNOSIS — K5792 Diverticulitis of intestine, part unspecified, without perforation or abscess without bleeding: Secondary | ICD-10-CM | POA: Diagnosis present

## 2017-04-28 DIAGNOSIS — I6782 Cerebral ischemia: Secondary | ICD-10-CM | POA: Insufficient documentation

## 2017-04-28 DIAGNOSIS — Z7901 Long term (current) use of anticoagulants: Secondary | ICD-10-CM | POA: Insufficient documentation

## 2017-04-28 DIAGNOSIS — D72829 Elevated white blood cell count, unspecified: Secondary | ICD-10-CM | POA: Diagnosis not present

## 2017-04-28 DIAGNOSIS — I482 Chronic atrial fibrillation: Secondary | ICD-10-CM | POA: Insufficient documentation

## 2017-04-28 DIAGNOSIS — E860 Dehydration: Secondary | ICD-10-CM | POA: Diagnosis not present

## 2017-04-28 DIAGNOSIS — K219 Gastro-esophageal reflux disease without esophagitis: Secondary | ICD-10-CM | POA: Diagnosis not present

## 2017-04-28 DIAGNOSIS — R112 Nausea with vomiting, unspecified: Secondary | ICD-10-CM | POA: Diagnosis not present

## 2017-04-28 DIAGNOSIS — W19XXXA Unspecified fall, initial encounter: Secondary | ICD-10-CM | POA: Diagnosis present

## 2017-04-28 DIAGNOSIS — T148XXA Other injury of unspecified body region, initial encounter: Secondary | ICD-10-CM | POA: Diagnosis not present

## 2017-04-28 DIAGNOSIS — I700 Atherosclerosis of aorta: Secondary | ICD-10-CM | POA: Diagnosis not present

## 2017-04-28 DIAGNOSIS — I4821 Permanent atrial fibrillation: Secondary | ICD-10-CM | POA: Diagnosis present

## 2017-04-28 DIAGNOSIS — R918 Other nonspecific abnormal finding of lung field: Secondary | ICD-10-CM | POA: Insufficient documentation

## 2017-04-28 DIAGNOSIS — M1990 Unspecified osteoarthritis, unspecified site: Secondary | ICD-10-CM | POA: Diagnosis not present

## 2017-04-28 DIAGNOSIS — Z79899 Other long term (current) drug therapy: Secondary | ICD-10-CM | POA: Insufficient documentation

## 2017-04-28 DIAGNOSIS — M25552 Pain in left hip: Secondary | ICD-10-CM | POA: Insufficient documentation

## 2017-04-28 DIAGNOSIS — I4581 Long QT syndrome: Secondary | ICD-10-CM | POA: Insufficient documentation

## 2017-04-28 DIAGNOSIS — K5900 Constipation, unspecified: Secondary | ICD-10-CM | POA: Diagnosis not present

## 2017-04-28 DIAGNOSIS — R55 Syncope and collapse: Secondary | ICD-10-CM | POA: Diagnosis not present

## 2017-04-28 DIAGNOSIS — S3991XA Unspecified injury of abdomen, initial encounter: Secondary | ICD-10-CM | POA: Diagnosis not present

## 2017-04-28 DIAGNOSIS — Z87891 Personal history of nicotine dependence: Secondary | ICD-10-CM | POA: Insufficient documentation

## 2017-04-28 DIAGNOSIS — R4020 Unspecified coma: Secondary | ICD-10-CM | POA: Diagnosis not present

## 2017-04-28 DIAGNOSIS — I10 Essential (primary) hypertension: Secondary | ICD-10-CM | POA: Diagnosis not present

## 2017-04-28 DIAGNOSIS — Z8711 Personal history of peptic ulcer disease: Secondary | ICD-10-CM | POA: Insufficient documentation

## 2017-04-28 DIAGNOSIS — S79912A Unspecified injury of left hip, initial encounter: Secondary | ICD-10-CM | POA: Diagnosis not present

## 2017-04-28 DIAGNOSIS — Z9049 Acquired absence of other specified parts of digestive tract: Secondary | ICD-10-CM | POA: Insufficient documentation

## 2017-04-28 DIAGNOSIS — M5489 Other dorsalgia: Secondary | ICD-10-CM | POA: Diagnosis not present

## 2017-04-28 HISTORY — DX: Urinary tract infection, site not specified: N39.0

## 2017-04-28 LAB — COMPREHENSIVE METABOLIC PANEL
ALT: 20 U/L (ref 14–54)
AST: 27 U/L (ref 15–41)
Albumin: 3.5 g/dL (ref 3.5–5.0)
Alkaline Phosphatase: 34 U/L — ABNORMAL LOW (ref 38–126)
Anion gap: 7 (ref 5–15)
BUN: 17 mg/dL (ref 6–20)
CO2: 26 mmol/L (ref 22–32)
Calcium: 9.5 mg/dL (ref 8.9–10.3)
Chloride: 102 mmol/L (ref 101–111)
Creatinine, Ser: 1.18 mg/dL — ABNORMAL HIGH (ref 0.44–1.00)
GFR calc Af Amer: 49 mL/min — ABNORMAL LOW (ref >60)
GFR calc non Af Amer: 42 mL/min — ABNORMAL LOW (ref >60)
Glucose, Bld: 128 mg/dL — ABNORMAL HIGH (ref 65–99)
Potassium: 3.5 mmol/L (ref 3.5–5.1)
Sodium: 135 mmol/L (ref 135–145)
Total Bilirubin: 1.2 mg/dL (ref 0.3–1.2)
Total Protein: 6 g/dL — ABNORMAL LOW (ref 6.5–8.1)

## 2017-04-28 LAB — URINALYSIS, ROUTINE W REFLEX MICROSCOPIC
Bilirubin Urine: NEGATIVE
Glucose, UA: NEGATIVE mg/dL
Ketones, ur: NEGATIVE mg/dL
Leukocytes, UA: NEGATIVE
Nitrite: POSITIVE — AB
Protein, ur: NEGATIVE mg/dL
Specific Gravity, Urine: 1.011 (ref 1.005–1.030)
pH: 5 (ref 5.0–8.0)

## 2017-04-28 LAB — CBC
HCT: 42.8 % (ref 36.0–46.0)
Hemoglobin: 14.7 g/dL (ref 12.0–15.0)
MCH: 30.3 pg (ref 26.0–34.0)
MCHC: 34.3 g/dL (ref 30.0–36.0)
MCV: 88.2 fL (ref 78.0–100.0)
Platelets: 86 10*3/uL — ABNORMAL LOW (ref 150–400)
RBC: 4.85 MIL/uL (ref 3.87–5.11)
RDW: 12.9 % (ref 11.5–15.5)
WBC: 9 10*3/uL (ref 4.0–10.5)

## 2017-04-28 LAB — PROTIME-INR
INR: 1.53
Prothrombin Time: 18.3 seconds — ABNORMAL HIGH (ref 11.4–15.2)

## 2017-04-28 MED ORDER — BISACODYL 5 MG PO TBEC: 5.0000 mg | DELAYED_RELEASE_TABLET | Freq: Every day | ORAL | Status: DC | PRN

## 2017-04-28 MED ORDER — PAROXETINE HCL 10 MG PO TABS
10.0000 mg | ORAL_TABLET | ORAL | Status: DC
Start: 2017-04-29 — End: 2017-04-30
  Administered 2017-04-29 – 2017-04-30 (×2): 10 mg via ORAL
  Filled 2017-04-28 (×3): qty 1

## 2017-04-28 MED ORDER — WARFARIN SODIUM 2.5 MG PO TABS
2.5000 mg | ORAL_TABLET | Freq: Once | ORAL | Status: DC
  Filled 2017-04-28: qty 1

## 2017-04-28 MED ORDER — SODIUM CHLORIDE 0.9 % IV SOLN
INTRAVENOUS | Status: AC
  Administered 2017-04-29: via INTRAVENOUS

## 2017-04-28 MED ORDER — TRAMADOL HCL 50 MG PO TABS
50.0000 mg | ORAL_TABLET | Freq: Four times a day (QID) | ORAL | Status: DC
  Administered 2017-04-29 – 2017-04-30 (×4): 50 mg via ORAL
  Filled 2017-04-28 (×4): qty 1

## 2017-04-28 MED ORDER — METRONIDAZOLE IN NACL 5-0.79 MG/ML-% IV SOLN
500.0000 mg | Freq: Once | INTRAVENOUS | Status: AC
  Administered 2017-04-28: 500 mg via INTRAVENOUS
  Filled 2017-04-28: qty 100

## 2017-04-28 MED ORDER — ONDANSETRON HCL 4 MG/2ML IJ SOLN
4.0000 mg | Freq: Once | INTRAMUSCULAR | Status: AC
  Administered 2017-04-28: 4 mg via INTRAVENOUS
  Filled 2017-04-28: qty 2

## 2017-04-28 MED ORDER — DEXTROSE 5 % IV SOLN
1.0000 g | INTRAVENOUS | Status: DC
  Filled 2017-04-28: qty 10

## 2017-04-28 MED ORDER — AMLODIPINE BESYLATE 5 MG PO TABS
5.0000 mg | ORAL_TABLET | Freq: Every day | ORAL | Status: DC
  Administered 2017-04-28 – 2017-04-30 (×3): 5 mg via ORAL
  Filled 2017-04-28 (×3): qty 1

## 2017-04-28 MED ORDER — ACETAMINOPHEN 325 MG PO TABS
650.0000 mg | ORAL_TABLET | Freq: Once | ORAL | Status: DC
Start: 2017-04-28 — End: 2017-04-28
  Filled 2017-04-28: qty 2

## 2017-04-28 MED ORDER — LOSARTAN POTASSIUM 50 MG PO TABS: 100.0000 mg | ORAL_TABLET | Freq: Every day | ORAL | Status: DC

## 2017-04-28 MED ORDER — SODIUM CHLORIDE 0.9 % IV BOLUS (SEPSIS)
1000.0000 mL | Freq: Once | INTRAVENOUS | Status: AC
  Administered 2017-04-28: 1000 mL via INTRAVENOUS

## 2017-04-28 MED ORDER — METOPROLOL TARTRATE 25 MG PO TABS
25.0000 mg | ORAL_TABLET | Freq: Two times a day (BID) | ORAL | Status: DC
  Administered 2017-04-28 – 2017-04-30 (×4): 25 mg via ORAL
  Filled 2017-04-28 (×4): qty 1

## 2017-04-28 MED ORDER — METRONIDAZOLE IN NACL 5-0.79 MG/ML-% IV SOLN: 500.0000 mg | Freq: Three times a day (TID) | INTRAVENOUS | Status: DC

## 2017-04-28 MED ORDER — DARIFENACIN HYDROBROMIDE ER 7.5 MG PO TB24
7.5000 mg | ORAL_TABLET | Freq: Every day | ORAL | Status: DC
  Administered 2017-04-28 – 2017-04-30 (×3): 7.5 mg via ORAL
  Filled 2017-04-28 (×3): qty 1

## 2017-04-28 MED ORDER — METRONIDAZOLE IN NACL 5-0.79 MG/ML-% IV SOLN
500.0000 mg | Freq: Three times a day (TID) | INTRAVENOUS | Status: DC
  Administered 2017-04-29 (×2): 500 mg via INTRAVENOUS
  Filled 2017-04-28 (×2): qty 100

## 2017-04-28 MED ORDER — IOPAMIDOL (ISOVUE-300) INJECTION 61%
INTRAVENOUS | Status: AC
  Administered 2017-04-28: 75 mL
  Filled 2017-04-28: qty 75

## 2017-04-28 MED ORDER — ACETAMINOPHEN 650 MG RE SUPP: 650.0000 mg | Freq: Four times a day (QID) | RECTAL | Status: DC | PRN

## 2017-04-28 MED ORDER — WARFARIN - PHARMACIST DOSING INPATIENT: Freq: Every day | Status: DC

## 2017-04-28 MED ORDER — PRAVASTATIN SODIUM 40 MG PO TABS
20.0000 mg | ORAL_TABLET | Freq: Every day | ORAL | Status: DC
  Administered 2017-04-28 – 2017-04-29 (×2): 20 mg via ORAL
  Filled 2017-04-28 (×2): qty 1

## 2017-04-28 MED ORDER — ACETAMINOPHEN 325 MG PO TABS: 650.0000 mg | ORAL_TABLET | Freq: Four times a day (QID) | ORAL | Status: DC | PRN

## 2017-04-28 MED ORDER — POLYVINYL ALCOHOL 1.4 % OP SOLN
1.0000 [drp] | Freq: Every day | OPHTHALMIC | Status: DC | PRN
  Filled 2017-04-28: qty 15

## 2017-04-28 MED ORDER — TRAZODONE HCL 50 MG PO TABS: 25.0000 mg | ORAL_TABLET | Freq: Every evening | ORAL | Status: DC | PRN

## 2017-04-28 MED ORDER — SODIUM CHLORIDE 0.9 % IV SOLN
Freq: Once | INTRAVENOUS | Status: AC
  Administered 2017-04-28: 16:00:00 via INTRAVENOUS

## 2017-04-28 MED ORDER — HYDROCODONE-ACETAMINOPHEN 5-325 MG PO TABS: 1.0000 | ORAL_TABLET | ORAL | Status: DC | PRN

## 2017-04-28 MED ORDER — DEXTROSE 5 % IV SOLN
2.0000 g | Freq: Once | INTRAVENOUS | Status: AC
  Administered 2017-04-28: 2 g via INTRAVENOUS
  Filled 2017-04-28: qty 2

## 2017-04-28 MED ORDER — ACETAMINOPHEN 650 MG RE SUPP
650.0000 mg | Freq: Once | RECTAL | Status: AC
  Administered 2017-04-28: 650 mg via RECTAL
  Filled 2017-04-28: qty 1

## 2017-04-28 MED ORDER — MAGNESIUM HYDROXIDE 400 MG/5ML PO SUSP
30.0000 mL | Freq: Every day | ORAL | Status: DC
  Filled 2017-04-28 (×2): qty 30

## 2017-04-28 NOTE — ED Notes (Signed)
Pt reports using ice only for pain, denies needing any pain medication at this time.

## 2017-04-28 NOTE — Progress Notes (Signed)
ANTICOAGULATION CONSULT NOTE - Initial Consult  Pharmacy Consult for Coumadin Indication: atrial fibrillation  Allergies  Allergen Reactions  . Sulfa Drugs Cross Reactors Rash    Patient Measurements: Height: 5\' 5"  (165.1 cm) Weight: 150 lb (68 kg) IBW/kg (Calculated) : 57  Vital Signs: Temp: 98.6 F (37 C) (09/25 1033) Temp Source: Oral (09/25 1033) BP: 160/92 (09/25 1500) Pulse Rate: 76 (09/25 1500)  Labs:  Recent Labs  04/28/17 1023 04/28/17 1055  HGB 14.7  --   HCT 42.8  --   PLT 86*  --   LABPROT 18.3*  --   INR 1.53  --   CREATININE  --  1.18*    Estimated Creatinine Clearance: 33.6 mL/min (A) (by C-G formula based on SCr of 1.18 mg/dL (H)).   Medical History: Past Medical History:  Diagnosis Date  . Dyslipidemia   . Edema extremities 09/03/2016  . Fatty liver   . GERD (gastroesophageal reflux disease)   . Hypertension   . Lymphocytosis    followed by Dr. Julien Nordmann  . Multinodular goiter   . Osteoarthritis   . Permanent atrial fibrillation (HCC)    atrial fibrillation s/p DCCV 3/10 and 6/10  . PONV (postoperative nausea and vomiting)   . PUD (peptic ulcer disease)   . Pulmonary nodules    followed  by Dr. Stephanie Acre   Assessment: 96EXB on coumadin pta afib, admitted s/p fall. INR on admit subtherapeutic at 1.53. CT head negative for bleed. Hip xray negative. No plans for surgery and coumadin to continue. She is starting on flagyl for diverticulitis so will need to watch for drug interaction.  Home dose: 2.5mg  every other day - last taken 9/24  Goal of Therapy:  INR 2-3 Monitor platelets by anticoagulation protocol: Yes   Plan:  1) Coumadin 2.5mg  tonight 2) Daily INR  Deboraha Sprang 04/28/2017,5:00 PM

## 2017-04-28 NOTE — ED Notes (Signed)
Pt going to 5W 19. Stable with no complaints. Family at bedside.

## 2017-04-28 NOTE — ED Notes (Signed)
Pt to go to room 19 on 5W. Will call report after 20 minutes. Pt stable. No complaints from patient.

## 2017-04-28 NOTE — ED Notes (Signed)
Gave pt ice chips, per Dr. Tyrone Nine.

## 2017-04-28 NOTE — ED Notes (Signed)
Patient transported to X-ray 

## 2017-04-28 NOTE — ED Provider Notes (Signed)
[facility] DEPT Provider Note   CSN: [id number] Arrival date & time: 04/28/17  1016     History   Chief Complaint Chief Complaint  Patient presents with  . Fall    HPI Mallory Stephens is a 83 y.o. female.  HPI   Patient is an 83 y.o. Female with a history of permanent Afib (on warfarin), HTN, dyslipidemia, and OA presenting for a fall at home on 04/28/2017. Patient reports that she was bending over to feed her dog and experienced a syncopal episode. Patient does not remember exactly how she fell or what she went down, but claims she did not hit her head. Patient reports that she is having pain in the left hip and side particularly with movement. Patient has tried ice for relief. Patient was ambulatory after the event, however required support from family members. Patient denies any chest pain, shortness of breath, diaphoresis, headache, changes in vision, numbness, or weakness surrounding the syncopal episode. Patient has had vomiting and diarrhea 3 days, nonbloody and nonbilious. No melena or hematochezia. Patient has had minimal liquid intake since the onset of this illness, and has not been able to keep down much nutrition other than broth. Patient reports she felt chilled at the onset of this GI illness, however has not had fever or chills in the last 24 hours.   On chart review, last INR 1 month ago was therapeutic.  Past Medical History:  Diagnosis Date  . Dyslipidemia   . Edema extremities 09/03/2016  . Fatty liver   . GERD (gastroesophageal reflux disease)   . Hypertension   . Lymphocytosis    followed by Dr. Julien Nordmann  . Multinodular goiter   . Osteoarthritis   . Permanent atrial fibrillation (HCC)    atrial fibrillation s/p DCCV 3/10 and 6/10  . PONV (postoperative nausea and vomiting)   . PUD (peptic ulcer disease)   . Pulmonary nodules    followed  by Dr. Stephanie Acre    Patient Active Problem List   Diagnosis Date Noted  . UTI (urinary tract infection)  04/28/2017  . Fall 04/28/2017  . Nausea and vomiting 04/28/2017  . Generalized weakness 04/28/2017  . Constipation 04/28/2017  . Diverticulitis   . Edema extremities 09/03/2016  . Encounter for therapeutic drug monitoring 11/15/2014  . Dyslipidemia   . Permanent atrial fibrillation ([place]) 07/08/2013  . Essential hypertension, benign 07/08/2013  . Long term (current) use of anticoagulants 07/08/2013  . Encounter for long-term (current) use of other medications 07/08/2013    Past Surgical History:  Procedure Laterality Date  . aspiration of cyst  1978  . [place]   right breast for benign lesion  . BREAST SURGERY  1971   bilateral lumpectomy for beingn lesions  . CARDIOVERSION  07/22/2011   Procedure: CARDIOVERSION;  Surgeon: [name], MD;  Location: [facility];  Service: Cardiovascular;  Laterality: N/A;  . CARDIOVERSION  09/02/2011   Procedure: CARDIOVERSION;  Surgeon: [name], MD;  Location: [facility];  Service: Cardiovascular;  Laterality: N/A;  . CHOLECYSTECTOMY  2003  . CYSTOSCOPY    . DILATION AND CURETTAGE OF UTERUS  1979  . JOINT REPLACEMENT  2002   Right knee  . JOINT REPLACEMENT  2007   left knee    OB History    No data available       Home Medications    Prior to Admission medications   Medication Sig Start Date End Date Taking? Authorizing Provider  acetaminophen (  TYLENOL) 325 MG tablet Take 325 mg by mouth every 6 (six) hours as needed for moderate pain.   Yes [provider]  amLODipine (NORVASC) 5 MG tablet Take 1 tablet (5 mg total) by mouth daily. 11/11/16  Yes Turner, Eber Hong, MD  losartan (COZAAR) 100 MG tablet TAKE 1 TABLET BY MOUTH EVERY DAY Patient taking differently: TAKE 100 mg TABLET BY MOUTH EVERY DAY 07/09/15  Yes Turner, Eber Hong, MD  metoprolol tartrate (LOPRESSOR) 25 MG tablet Take 25 mg by mouth 2 (two) times daily.   Yes [provider]  PARoxetine (PAXIL) 10 MG tablet Take 10 mg by mouth every morning.     Yes [provider]  polyvinyl alcohol (LIQUIFILM TEARS) 1.4 % ophthalmic solution Place 1-2 drops into both eyes daily as needed for dry eyes.   Yes [provider]  pravastatin (PRAVACHOL) 20 MG tablet Take 20 mg by mouth at bedtime.    Yes [provider]  ranitidine (ZANTAC) 300 MG tablet Take 300 mg by mouth at bedtime.    Yes [provider]  solifenacin (VESICARE) 5 MG tablet Take 5 mg by mouth daily.   Yes [provider]  warfarin (COUMADIN) 5 MG tablet TAKE AS DIRECTED BY [facility]IN CLINIC Patient taking differently: 2.5 mg take every other day 01/23/17  Yes Turner, Eber Hong, MD    Family History Family History  Problem Relation Age of Onset  . Stroke Mother   . Stroke Father   . Breast cancer Sister   . Heart disease Brother   . Prostate cancer Brother   . Prostate cancer Brother   . Arthritis Sister   . Scoliosis Sister     Social History Social History  Substance Use Topics  . Smoking status: Former Smoker    Quit date: 07/16/1989  . Smokeless tobacco: Never Used  . Alcohol use Yes     Comment: occasional wine     Allergies   Sulfa drugs cross reactors   Review of Systems Review of Systems  Constitutional: Positive for appetite change and chills. Negative for fever.  HENT: Negative for congestion, rhinorrhea, sinus pain and sore throat.   Eyes: Negative for visual disturbance.  Respiratory: Negative for cough, chest tightness and shortness of breath.   Cardiovascular: Negative for chest pain and leg swelling.  Gastrointestinal: Positive for diarrhea, nausea and vomiting. Negative for abdominal pain, blood in stool and constipation.  Genitourinary: Negative for dysuria.  Musculoskeletal: Negative for back pain and myalgias.  Skin: Negative for rash.  Neurological: Positive for dizziness and syncope. Negative for weakness, light-headedness, numbness and headaches.  Hematological: Bruises/bleeds easily.        Unchanged from normal bruising.     Physical Exam Updated Vital Signs BP 104/69 (BP Location: Left Arm)   Pulse 98   Temp 99.1 F (37.3 C) (Oral)   Resp 20   Ht 5\' 5"  (1.651 m)   Wt 68 kg (150 lb)   SpO2 94%   BMI 24.96 kg/m   Physical Exam  Constitutional: She appears well-developed and well-nourished. No distress.  HENT:  Head: Normocephalic and atraumatic.  Mucous membranes slightly dry.  Eyes: Pupils are equal, round, and reactive to light. Conjunctivae and EOM are normal.  Neck: Normal range of motion. Neck supple.  Cardiovascular: Normal rate, S1 normal and S2 normal.   No murmur heard. Irregularly irregular rhythm. Tachycardia noted.  Pulmonary/Chest: Effort normal and breath sounds normal. She has no wheezes. She has no  rales.  Abdominal: Soft. She exhibits no distension. There is no tenderness. There is no guarding.  Musculoskeletal: Normal range of motion. She exhibits no edema or deformity.  Neurological: She is alert.  Mental Status:  Alert, oriented, thought content appropriate, able to give a coherent history. Speech fluent without evidence of aphasia. Able to follow 2 step commands without difficulty.  Cranial Nerves:  II:  Peripheral visual fields grossly normal, pupils equal, round, reactive to light III,IV, VI: ptosis not present, extra-ocular motions intact bilaterally  V,VII: smile symmetric, facial light touch sensation equal VIII: hearing grossly normal to voice  X: uvula elevates symmetrically  XI: bilateral shoulder shrug symmetric and strong XII: midline tongue extension without fassiculations Motor:  Normal tone. 5/5 in upper and lower extremities bilaterally including strong and equal grip strength and dorsiflexion/plantar flexion Sensory: Pinprick and light touch normal in all extremities.  Deep Tendon Reflexes: 2+ and symmetric in the patella. Cerebellar: normal finger-to-nose with bilateral upper extremities Gait: Gait deferred d/t hip  pain. Stance:  No pronator drift and good coordination, strength, and position sense with tapping of bilateral arms (performed in sitting position). CV: distal pulses palpable throughout and 2+ in quality.  Skin: Skin is warm and dry. No rash noted. No erythema.  Psychiatric: She has a normal mood and affect. Her behavior is normal. Judgment and thought content normal.  Nursing note and vitals reviewed.    ED Treatments / Results  Labs (all labs ordered are listed, but only abnormal results are displayed) Labs Reviewed  PROTIME-INR - Abnormal; Notable for the following:       Result Value   Prothrombin Time 18.3 (*)    All other components within normal limits  CBC - Abnormal; Notable for the following:    Platelets 86 (*)    All other components within normal limits  COMPREHENSIVE METABOLIC PANEL - Abnormal; Notable for the following:    Glucose, Bld 128 (*)    Creatinine, Ser 1.18 (*)    Total Protein 6.0 (*)    Alkaline Phosphatase 34 (*)    GFR calc non Af Amer 42 (*)    GFR calc Af Amer 49 (*)    All other components within normal limits  URINALYSIS, ROUTINE W REFLEX MICROSCOPIC - Abnormal; Notable for the following:    Hgb urine dipstick MODERATE (*)    Nitrite POSITIVE (*)    Bacteria, UA MANY (*)    Squamous Epithelial / LPF 0-5 (*)    All other components within normal limits  URINE CULTURE  PROTIME-INR  COMPREHENSIVE METABOLIC PANEL  CBC    EKG  EKG Interpretation  Date/Time:  Tuesday April 28 2017 10:44:31 EDT Ventricular Rate:  122 PR Interval:    QRS Duration: 90 QT Interval:  364 QTC Calculation: 519 R Axis:   57 Text Interpretation:  Atrial fibrillation Ventricular premature complex Low voltage, extremity and precordial leads Nonspecific T abnormalities, lateral leads Prolonged QT interval Baseline wander in lead(s) I II aVR Otherwise no significant change Confirmed by Deno Etienne [phone]) on 04/28/2017 10:51:37 AM       Radiology Ct Head Wo  Contrast  Result Date: 04/28/2017 CLINICAL DATA:  Altered level of consciousness. EXAM: CT HEAD WITHOUT CONTRAST TECHNIQUE: Contiguous axial images were obtained from the base of the skull through the vertex without intravenous contrast. COMPARISON:  CT scan of October 23, 2015. FINDINGS: Brain: Mild chronic ischemic white matter disease is noted. Mild diffuse cortical atrophy is noted. No mass effect  or midline shift is noted. Ventricular size is within normal limits. There is no evidence of mass lesion, hemorrhage or acute infarction. Vascular: No hyperdense vessel or unexpected calcification. Skull: Normal. Negative for fracture or focal lesion. Sinuses/Orbits: No acute finding. Other: None. IMPRESSION: Mild chronic ischemic white matter disease. Mild diffuse cortical atrophy. No acute intracranial abnormality seen. Electronically Signed   By: Marijo Conception, M.D.   On: 04/28/2017 11:46   Ct Abdomen Pelvis W Contrast  Result Date: 04/28/2017 CLINICAL DATA:  CT A/P with contrast due to abd distension Pt BIB [facility] from home where pt lives with sister. Pt and sister report fall yesterday. C/O left hip pain, left ankle pain and NVD X2 days. Pt reports to EMS she is dizzy with standing. EXAM: CT ABDOMEN AND PELVIS WITH CONTRAST TECHNIQUE: Multidetector CT imaging of the abdomen and pelvis was performed using the standard protocol following bolus administration of intravenous contrast. CONTRAST:  57mL ISOVUE-300 IOPAMIDOL (ISOVUE-300) INJECTION 61% COMPARISON:  CT of the pelvis 05/05/2010 ; chest x-ray 10/23/2015 FINDINGS: Lower chest: The heart is enlarged, with prominent right and left atrium. There is mitral annulus calcification. No pericardial effusion. 11 mm calcified granuloma is present in the right lower lobe. Hepatobiliary: Status post cholecystectomy. There is postsurgical dilatation of the intra and extrahepatic bile ducts. No suspicious liver lesions. Pancreas: Unremarkable. No pancreatic ductal  dilatation or surrounding inflammatory changes. Spleen: Left upper quadrant splenule. Adrenals/Urinary Tract: Normal appearance of the adrenal glands. Stomach/Bowel: Normal appearance of the stomach and small bowel loops. The appendix is well seen and has a normal appearance. There is severe sigmoid diverticulosis. Small amount of fluid is identified adjacent to the sigmoid colon. No associated abscess or perforation. There is a large amount of stool throughout colonic loops, possibly contributing to the patient's symptoms. Vascular/Lymphatic: There is atherosclerotic calcification of the abdominal aorta not associated with aneurysm. Although involved by atherosclerosis, there is vascular opacification of the celiac axis, superior mesenteric artery, and inferior mesenteric artery. Normal appearance of the portal venous system and inferior vena cava. Reproductive: Uterus is present and partially calcified. No adnexal mass. Other: Trace free pelvic fluid. Musculoskeletal: There is a stable wedge compression fracture of L1. There is a superior endplate fracture X10, associated with anterior cortical bulge. There is no retropulsion of fracture fragments nor extension of the fracture into the posterior elements. Although strictly indeterminate age, and this could represent acute fracture. IMPRESSION: 1. Severe colonic diverticulosis and suspected mild acute diverticulitis combined with large stool burden/stool impaction. Small amount of free pelvic fluid not associated with perforation or abscess. 2. Possibly acute superior endplate fracture of G26 not associated retropulsion nor posterior element fracture. 3. Stable wedge compression fracture of L1. 4. Cardiomegaly. 5. Mitral annulus calcification. 6. Status post cholecystectomy. 7. Normal appendix. 8.  Aortic atherosclerosis.  (ICD10-I70.0) Electronically Signed   By: Nolon Nations M.D.   On: 04/28/2017 14:59   Dg Chest Port 1 View  Result Date:  04/28/2017 CLINICAL DATA:  Pt c/o weakness and nausea x 2 days. Hx of HTN, GERD, AFIB, AND pulmonary nodules. Pt is a former smoker. EXAM: PORTABLE CHEST 1 VIEW COMPARISON:  10/23/2015 FINDINGS: Heart is enlarged. There are no focal consolidations or pleural effusions. No pulmonary edema. Perihilar bronchitic changes are stable. Right lower lobe calcified granuloma is partially obscured. IMPRESSION: 1. Stable cardiomegaly. 2. Stable bronchitic changes. Electronically Signed   By: Nolon Nations M.D.   On: 04/28/2017 17:56   Dg Hip Unilat  W Or Wo Pelvis 2-3 Views Left  Result Date: 04/28/2017 CLINICAL DATA:  Status post fall at home yesterday striking a hardwood floor. Persistent inter lateral left hip pain. EXAM: DG HIP (WITH OR WITHOUT PELVIS) 2-3V LEFT COMPARISON:  No recent studies in [facility] FINDINGS: The bony pelvis is subjectively adequately mineralized. AP and lateral views of the left hip reveal preservation of the joint space. The articular surfaces of the femoral head and acetabulum remains smoothly rounded. The femoral neck, intertrochanteric, and subtrochanteric regions are normal. IMPRESSION: There is no acute or significant chronic bony abnormality of the left hip. The bony pelvis is grossly intact as well. Electronically Signed   By: David  Martinique M.D.   On: 04/28/2017 11:24    Procedures Procedures (including critical care time)  Medications Ordered in ED Medications  metoprolol tartrate (LOPRESSOR) tablet 25 mg (25 mg Oral Given 04/28/17 2130)  polyvinyl alcohol (LIQUIFILM TEARS) 1.4 % ophthalmic solution 1-2 drop (not administered)  darifenacin (ENABLEX) 24 hr tablet 7.5 mg (not administered)  PARoxetine (PAXIL) tablet 10 mg (not administered)  pravastatin (PRAVACHOL) tablet 20 mg (20 mg Oral Given 04/28/17 2130)  0.9 %  sodium chloride infusion (not administered)  acetaminophen (TYLENOL) tablet 650 mg (not administered)    Or  acetaminophen (TYLENOL) suppository 650 mg (not  administered)  HYDROcodone-acetaminophen (NORCO/VICODIN) 5-325 MG per tablet 1-2 tablet (not administered)  traZODone (DESYREL) tablet 25 mg (not administered)  bisacodyl (DULCOLAX) EC tablet 5 mg (not administered)  cefTRIAXone (ROCEPHIN) 1 g in dextrose 5 % 50 mL IVPB (not administered)  warfarin (COUMADIN) tablet 2.5 mg (not administered)  Warfarin - Pharmacist Dosing Inpatient (not administered)  amLODipine (NORVASC) tablet 5 mg (5 mg Oral Given 04/28/17 2130)  magnesium hydroxide (MILK OF MAGNESIA) suspension 30 mL (not administered)  metroNIDAZOLE (FLAGYL) IVPB 500 mg (not administered)  sodium chloride 0.9 % bolus 1,000 mL (0 mLs Intravenous Stopped 04/28/17 1802)  ondansetron (ZOFRAN) injection 4 mg (4 mg Intravenous Given 04/28/17 1227)  iopamidol (ISOVUE-300) 61 % injection (75 mLs  Contrast Given 04/28/17 1422)  0.9 %  sodium chloride infusion ( Intravenous New Bag/Given 04/28/17 1542)  cefTRIAXone (ROCEPHIN) 2 g in dextrose 5 % 50 mL IVPB (0 g Intravenous Stopped 04/28/17 1617)    And  metroNIDAZOLE (FLAGYL) IVPB 500 mg (0 mg Intravenous Stopped 04/28/17 1617)  ondansetron (ZOFRAN) injection 4 mg (4 mg Intravenous Given 04/28/17 1542)  acetaminophen (TYLENOL) suppository 650 mg (650 mg Rectal Given 04/28/17 1544)     Initial Impression / Assessment and Plan / ED Course  I have reviewed the triage vital signs and the nursing notes.  Pertinent labs & imaging results that were available during my care of the patient were reviewed by me and considered in my medical decision making (see chart for details).  Clinical Course as of Apr 29 2155  Tue Apr 28, 2017  1100 Patient seen and evaluated. Patient case discussed with Dr. Deno Etienne.  [AM]  1228 Patient case discussed with Dr. Deno Etienne. Decision made to proceed with CT abdomen and pelvis with contrast for some abdominal distention.  [AM]    Clinical Course User Index [AM] Albesa Seen, PA-C    Final Clinical Impressions(s) / ED  Diagnoses   Final diagnoses:  Thrombocytopenia ([place])  Urinary tract infection with hematuria, site unspecified   MDM  Patient is an 83 y.o. Female with a history of permanent Afib (on warfarin), HTN, dyslipidemia, and OA presenting for a fall at home on  04/28/2017. Patient found to have evidence of UTI on UA and possible diverticulitis on CT scan with possible impaction. Proceeded to initiate treatment with Rocephin and Flagyl. Patient well appearing after these interventions. Patient developed fever of 100.7 in ED today and due to patient's inability to maintain PO intake, proceeded to admit patient. Patient and family in understanding and agree with the plan of care.   EKG demonstrates atrial fibrillation. This is chronic for patient. Patient is tachycardic ranging from in the 110-120 range. No evidence of ischemia or infarction. Nonspecific ST-T wave changes noted. Patient responded well to initial fluid bolus and maintenance fluid with reduction in heart rate. No evidence of ICH on non-con head CT. No evidence of acute fracture on x-ray of the hip. Thrombocytopenia noted in emergency department today (86). Prior results from 2011, and patient unaware of a history of this.   This is a shared visit with Dr. Patient was independently evaluated by this attending physician. Attending physician consulted in evaluation and admission.  New Prescriptions Current Discharge Medication List       Tamala Julian 04/28/17 [place], [place], DO 04/29/17 [phone]

## 2017-04-28 NOTE — H&P (Signed)
History and Physical    KAYLISE BLAKELEY XBJ:[id number] DOB: [birth year] DOA: 04/28/2017  PCP: [name], MD Patient coming from: home  Chief Complaint: generalized weakness/fall/nausea and vomiting  HPI: Mallory Stephens is a delightful 83 y.o. female with medical history significant for A. fib, hypertension, GERD, presents to the emergency department with chief complaint of generalized weakness/recent fall/nausea and vomiting. Initial evaluation reveals a urinary tract infection, diverticulitis, mild acute kidney injury/dehydration.  Information is obtained from the patient. She states that last week she had a hot dog with onions. She has difficulty tolerating onions but enjoys them associated any way. Shortly thereafter she developed abdominal pain with diarrhea. Associated symptoms included nausea with 2 episodes of emesis. She denies coffee ground emesis. She states she stopped eating and drinking for a while and symptoms resolved. 2 days ago she developed generalized weakness with some mild dizziness with position change. She states that yesterday she bent over to place the dog food dish on the floor she became dizzy and fell. She denominator head she did not lose consciousness. She was able to get up with the assistance of her sister but was very weak. She denies headache visual disturbances numbness tingling of extremities. She denies difficulty chewing swallowing. No report of any slurred speech. She denies chest pain palpitation shortness of breath fever chills cough. She denies lower extremity edema. She denies dysuria hematuria frequency or urgency.   ED Course: In the emergency department she's afebrile blood pressure high end of normal mild tachycardia mild tachypnea no hypoxia. He is provided with fluid resuscitation Flagyl and Rocephin. At the time of admission she is alert and oriented nontoxic appearing  Review of Systems: As per HPI otherwise all other systems reviewed and are  negative.   Ambulatory Status: Ambulates independently with a somewhat unsteady gait is independent with ADLs  Past Medical History:  Diagnosis Date  . Dyslipidemia   . Edema extremities 09/03/2016  . Fatty liver   . GERD (gastroesophageal reflux disease)   . Hypertension   . Lymphocytosis    followed by Dr. Julien Nordmann  . Multinodular goiter   . Osteoarthritis   . Permanent atrial fibrillation (HCC)    atrial fibrillation s/p DCCV 3/10 and 6/10  . PONV (postoperative nausea and vomiting)   . PUD (peptic ulcer disease)   . Pulmonary nodules    followed  by Dr. Stephanie Acre    Past Surgical History:  Procedure Laterality Date  . aspiration of cyst  1978  . [place]   right breast for benign lesion  . BREAST SURGERY  1971   bilateral lumpectomy for beingn lesions  . CARDIOVERSION  07/22/2011   Procedure: CARDIOVERSION;  Surgeon: [name], MD;  Location: [facility];  Service: Cardiovascular;  Laterality: N/A;  . CARDIOVERSION  09/02/2011   Procedure: CARDIOVERSION;  Surgeon: [name], MD;  Location: [place];  Service: Cardiovascular;  Laterality: N/A;  . CHOLECYSTECTOMY  2003  . CYSTOSCOPY    . DILATION AND CURETTAGE OF UTERUS  1979  . JOINT REPLACEMENT  2002   Right knee  . JOINT REPLACEMENT  2007   left knee    Social History   Social History  . Marital status: Widowed    Spouse name: N/A  . Number of children: N/A  . Years of education: N/A   Occupational History  . Not on file.   Social History Main Topics  . 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 Smokeless tobacco: Never Used  . Alcohol use Yes     Comment: occasional wine  . Drug use: No  . Sexual activity: Not on file   Other Topics Concern  . Not on file   Social History Narrative  . No narrative on file    Allergies  Allergen Reactions  . Sulfa Drugs Cross Reactors Rash    Family History  Problem Relation Age of Onset  . Stroke Mother   . Stroke Father   .  Breast cancer Sister   . Heart disease Brother   . Prostate cancer Brother   . Prostate cancer Brother   . Arthritis Sister   . Scoliosis Sister     Prior to Admission medications   Medication Sig Start Date End Date Taking? Authorizing Provider  acetaminophen (TYLENOL) 325 MG tablet Take 325 mg by mouth every 6 (six) hours as needed for moderate pain.   Yes [provider]  amLODipine (NORVASC) 5 MG tablet Take 1 tablet (5 mg total) by mouth daily. 11/11/16  Yes Turner, Eber Hong, MD  losartan (COZAAR) 100 MG tablet TAKE 1 TABLET BY MOUTH EVERY DAY Patient taking differently: TAKE 100 mg TABLET BY MOUTH EVERY DAY 07/09/15  Yes Turner, Eber Hong, MD  metoprolol tartrate (LOPRESSOR) 25 MG tablet Take 25 mg by mouth 2 (two) times daily.   Yes [provider]  PARoxetine (PAXIL) 10 MG tablet Take 10 mg by mouth every morning.    Yes [provider]  polyvinyl alcohol (LIQUIFILM TEARS) 1.4 % ophthalmic solution Place 1-2 drops into both eyes daily as needed for dry eyes.   Yes [provider]  pravastatin (PRAVACHOL) 20 MG tablet Take 20 mg by mouth at bedtime.    Yes [provider]  ranitidine (ZANTAC) 300 MG tablet Take 300 mg by mouth at bedtime.    Yes [provider]  solifenacin (VESICARE) 5 MG tablet Take 5 mg by mouth daily.   Yes [provider]  warfarin (COUMADIN) 5 MG tablet TAKE AS DIRECTED BY COUMADIN CLINIC Patient taking differently: 2.5 mg take every other day 01/23/17  Yes [name], MD    Physical Exam: Vitals:   04/28/17 1330 04/28/17 1345 04/28/17 1430 04/28/17 1500  BP: 139/76 140/67 (!) 150/54 (!) 160/92  Pulse: 87 93 (!) 106 76  Resp: (!) 23 20 (!) 26 (!) 25  Temp:      TempSrc:      SpO2: 91% 94% 92% 93%  Weight:      Height:         General:  Appears calm and comfortable No acute distress  Eyes:  PERRL, EOMI, normal lids, iris ENT:  grossly normal hearing, lips & tongue, mucous membranes of  her mouth are pink but very dry  Neck:  no LAD, masses or thyromegaly Cardiovascular: Irregularly irregular, no m/r/g. No LE edema. Pulses present and palpable  Respiratory:  CTA bilaterally, no w/r/r. Normal respiratory effort. Abdomen:  soft, ntnd, obese positive bowel sounds no guarding or rebounding  Skin:  no rash or induration seen on limited exam Musculoskeletal:  grossly normal tone BUE/BLE, good ROM, no bony abnormality Psychiatric:  grossly normal mood and affect, speech fluent and appropriate, AOx3 Neurologic:  CN 2-12 grossly intact, moves all extremities in coordinated fashion, sensation intact  Labs on Admission: I have personally reviewed following labs and imaging studies  CBC:  Recent Labs Lab 04/28/17 1023  WBC 9.0  HGB 14.7  HCT 42.8  MCV 88.2  PLT 86*   Basic Metabolic Panel:  Recent Labs Lab 04/28/17 1055  NA 135  K 3.5  CL 102  CO2 26  GLUCOSE 128*  BUN 17  CREATININE 1.18*  CALCIUM 9.5   GFR: Estimated Creatinine Clearance: 33.6 mL/min (A) (by C-G formula based on SCr of 1.18 mg/dL (H)). Liver Function Tests:  Recent Labs Lab 04/28/17 1055  AST 27  ALT 20  ALKPHOS 34*  BILITOT 1.2  PROT 6.0*  ALBUMIN 3.5   No results for input(s): LIPASE, AMYLASE in the last 168 hours. No results for input(s): AMMONIA in the last 168 hours. Coagulation Profile:  Recent Labs Lab 04/28/17 1023  INR 1.53   Cardiac Enzymes: No results for input(s): CKTOTAL, CKMB, CKMBINDEX, TROPONINI in the last 168 hours. BNP (last 3 results) No results for input(s): PROBNP in the last 8760 hours. HbA1C: No results for input(s): HGBA1C in the last 72 hours. CBG: No results for input(s): GLUCAP in the last 168 hours. Lipid Profile: No results for input(s): CHOL, HDL, LDLCALC, TRIG, CHOLHDL, LDLDIRECT in the last 72 hours. Thyroid Function Tests: No results for input(s): TSH, T4TOTAL, FREET4, T3FREE, THYROIDAB in the last 72 hours. Anemia Panel: No results for  input(s): VITAMINB12, FOLATE, FERRITIN, TIBC, IRON, RETICCTPCT in the last 72 hours. Urine analysis:    Component Value Date/Time   COLORURINE YELLOW 04/28/2017 1310   APPEARANCEUR CLEAR 04/28/2017 1310   LABSPEC 1.011 04/28/2017 1310   PHURINE 5.0 04/28/2017 1310   GLUCOSEU NEGATIVE 04/28/2017 1310   HGBUR MODERATE (A) 04/28/2017 1310   BILIRUBINUR NEGATIVE 04/28/2017 1310   KETONESUR NEGATIVE 04/28/2017 1310   PROTEINUR NEGATIVE 04/28/2017 1310   UROBILINOGEN 0.2 06/23/2010 1322   NITRITE POSITIVE (A) 04/28/2017 1310   LEUKOCYTESUR NEGATIVE 04/28/2017 1310    Creatinine Clearance: Estimated Creatinine Clearance: 33.6 mL/min (A) (by C-G formula based on SCr of 1.18 mg/dL (H)).  Sepsis Labs: @LABRCNTIP (procalcitonin:4,lacticidven:4) )No results found for this or any previous visit (from the past 240 hour(s)).   Radiological Exams on Admission: Ct Head Wo Contrast  Result Date: 04/28/2017 CLINICAL DATA:  Altered level of consciousness. EXAM: CT HEAD WITHOUT CONTRAST TECHNIQUE: Contiguous axial images were obtained from the base of the skull through the vertex without intravenous contrast. COMPARISON:  CT scan of October 23, 2015. FINDINGS: Brain: Mild chronic ischemic white matter disease is noted. Mild diffuse cortical atrophy is noted. No mass effect or midline shift is noted. Ventricular size is within normal limits. There is no evidence of mass lesion, hemorrhage or acute infarction. Vascular: No hyperdense vessel or unexpected calcification. Skull: Normal. Negative for fracture or focal lesion. Sinuses/Orbits: No acute finding. Other: None. IMPRESSION: Mild chronic ischemic white matter disease. Mild diffuse cortical atrophy. No acute intracranial abnormality seen. Electronically Signed   By: Marijo Conception, M.D.   On: 04/28/2017 11:46   Ct Abdomen Pelvis W Contrast  Result Date: 04/28/2017 CLINICAL DATA:  CT A/P with contrast due to abd distension Pt BIB [facility] from home where pt  lives with sister. Pt and sister report fall yesterday. C/O left hip pain, left ankle pain and NVD X2 days. Pt reports to EMS she is dizzy with standing. EXAM: CT ABDOMEN AND PELVIS WITH CONTRAST TECHNIQUE: Multidetector CT imaging of the abdomen and pelvis was performed using the standard protocol following bolus administration of intravenous contrast. CONTRAST:  52mL ISOVUE-300 IOPAMIDOL (ISOVUE-300) INJECTION 61% COMPARISON:  CT of the pelvis 05/05/2010 ;  chest x-ray 10/23/2015 FINDINGS: Lower chest: The heart is enlarged, with prominent right and left atrium. There is mitral annulus calcification. No pericardial effusion. 11 mm calcified granuloma is present in the right lower lobe. Hepatobiliary: Status post cholecystectomy. There is postsurgical dilatation of the intra and extrahepatic bile ducts. No suspicious liver lesions. Pancreas: Unremarkable. No pancreatic ductal dilatation or surrounding inflammatory changes. Spleen: Left upper quadrant splenule. Adrenals/Urinary Tract: Normal appearance of the adrenal glands. Stomach/Bowel: Normal appearance of the stomach and small bowel loops. The appendix is well seen and has a normal appearance. There is severe sigmoid diverticulosis. Small amount of fluid is identified adjacent to the sigmoid colon. No associated abscess or perforation. There is a large amount of stool throughout colonic loops, possibly contributing to the patient's symptoms. Vascular/Lymphatic: There is atherosclerotic calcification of the abdominal aorta not associated with aneurysm. Although involved by atherosclerosis, there is vascular opacification of the celiac axis, superior mesenteric artery, and inferior mesenteric artery. Normal appearance of the portal venous system and inferior vena cava. Reproductive: Uterus is present and partially calcified. No adnexal mass. Other: Trace free pelvic fluid. Musculoskeletal: There is a stable wedge compression fracture of L1. There is a superior  endplate fracture M38, associated with anterior cortical bulge. There is no retropulsion of fracture fragments nor extension of the fracture into the posterior elements. Although strictly indeterminate age, and this could represent acute fracture. IMPRESSION: 1. Severe colonic diverticulosis and suspected mild acute diverticulitis combined with large stool burden/stool impaction. Small amount of free pelvic fluid not associated with perforation or abscess. 2. Possibly acute superior endplate fracture of G66 not associated retropulsion nor posterior element fracture. 3. Stable wedge compression fracture of L1. 4. Cardiomegaly. 5. Mitral annulus calcification. 6. Status post cholecystectomy. 7. Normal appendix. 8.  Aortic atherosclerosis.  (ICD10-I70.0) Electronically Signed   By: Nolon Nations M.D.   On: 04/28/2017 14:59   Dg Hip Unilat W Or Wo Pelvis 2-3 Views Left  Result Date: 04/28/2017 CLINICAL DATA:  Status post fall at home yesterday striking a hardwood floor. Persistent inter lateral left hip pain. EXAM: DG HIP (WITH OR WITHOUT PELVIS) 2-3V LEFT COMPARISON:  No recent studies in [facility] FINDINGS: The bony pelvis is subjectively adequately mineralized. AP and lateral views of the left hip reveal preservation of the joint space. The articular surfaces of the femoral head and acetabulum remains smoothly rounded. The femoral neck, intertrochanteric, and subtrochanteric regions are normal. IMPRESSION: There is no acute or significant chronic bony abnormality of the left hip. The bony pelvis is grossly intact as well. Electronically Signed   By: David  Martinique M.D.   On: 04/28/2017 11:24    EKG: Independently reviewed. Atrial fibrillation Ventricular premature complex Low voltage, extremity and precordial leads Nonspecific T abnormalities, lateral leads  Assessment/Plan Principal Problem:   Generalized weakness Active Problems:   Permanent atrial fibrillation (HCC)   Essential hypertension, benign    Long term (current) use of anticoagulants   UTI (urinary tract infection)   Fall   Nausea and vomiting   Constipation   #1. Generalized weakness/fall likely related to dehydration secondary to decreased oral intake as well as nausea and vomiting in the setting of urinary tract infection and diverticulitis. Urine consistent with urinary tract infection, CT of the abdomen reveals severe colonic diverticulosis and suspected mild acute diverticulitis combined with large stool burden/stool impaction. Small amount of free pelvic fluid not associated with perforation or abscess. Possibly acute superior endplate fracture of Z99 not associated retropulsion nor  posterior element fracture. He received IV fluids and antibiotics in the emergency department. -Admit to MedSurg -Continue IV fluids -Continue Rocephin and Flagyl -obtain chest xray -Clear liquid diet -milk of magnesia -Zofran as needed -Follow urine culture -Physical therapy evaluation  #2. Hypertension. Fair control in the emergency department. Home meds include amlodipine, losartan, metoprolol. -Hold losartan for now due to mildly elevated creatinine -Continue metoprolol and amlodipine -Monitor  #3. Atrial fibrillation.Chadvasc score 3. Heart rate 103 in the emergency department. EKG as noted above. Improved on admission. Home medications include metoprolol and Coumadin. INR 1.5. -Coumadin per pharmacy -Continue metoprolol -moniotor  #4. UTI/diverticulitis. Urinalysis consistent. CT as noted above. -See #1 -Clear liquid diet -Supportive therapy -Flagyl and Rocephin -Follow urine culture  DVT prophylaxis: coumadin  Code Status: full  Family Communication: sister at bedside  Disposition Plan: home may benefit placement or short term rehab  Consults called: none  Admission status: obs    Dyanne Carrel M MD [facility]  If 7PM-7AM, please contact night-coverage [email or web address] Password [facility]  04/28/2017, 5:21 PM

## 2017-04-28 NOTE — ED Notes (Signed)
ED Provider at bedside. 

## 2017-04-28 NOTE — ED Notes (Signed)
Pt to go to room 38 on [address]. Attempted to call report. Bed changed per charge RN due to room being at the end of the hall and pt possible fall risk. ED charge aware. Will wait for another bed assignment.

## 2017-04-28 NOTE — ED Notes (Signed)
Everlene Farrier pt sister requesting to be called when pt has room. [phone]

## 2017-04-28 NOTE — ED Triage Notes (Signed)
Pt BIB [facility] from home where pt lives with sister. Pt and sister report fall yesterday. C/O left hip pain, left ankle pain and NVD X2 days. Pt reports to EMS she is dizzy with standing.

## 2017-04-29 ENCOUNTER — Observation Stay (HOSPITAL_BASED_OUTPATIENT_CLINIC_OR_DEPARTMENT_OTHER): Payer: Medicare HMO

## 2017-04-29 ENCOUNTER — Encounter (HOSPITAL_COMMUNITY): Payer: Self-pay | Admitting: General Practice

## 2017-04-29 DIAGNOSIS — D72829 Elevated white blood cell count, unspecified: Secondary | ICD-10-CM

## 2017-04-29 DIAGNOSIS — R531 Weakness: Secondary | ICD-10-CM | POA: Diagnosis not present

## 2017-04-29 DIAGNOSIS — W19XXXA Unspecified fall, initial encounter: Secondary | ICD-10-CM | POA: Diagnosis not present

## 2017-04-29 DIAGNOSIS — I10 Essential (primary) hypertension: Secondary | ICD-10-CM

## 2017-04-29 DIAGNOSIS — K5900 Constipation, unspecified: Secondary | ICD-10-CM | POA: Diagnosis not present

## 2017-04-29 DIAGNOSIS — K5792 Diverticulitis of intestine, part unspecified, without perforation or abscess without bleeding: Secondary | ICD-10-CM | POA: Diagnosis not present

## 2017-04-29 DIAGNOSIS — R112 Nausea with vomiting, unspecified: Secondary | ICD-10-CM | POA: Diagnosis not present

## 2017-04-29 DIAGNOSIS — R55 Syncope and collapse: Secondary | ICD-10-CM | POA: Diagnosis not present

## 2017-04-29 DIAGNOSIS — I341 Nonrheumatic mitral (valve) prolapse: Secondary | ICD-10-CM | POA: Diagnosis not present

## 2017-04-29 DIAGNOSIS — E876 Hypokalemia: Secondary | ICD-10-CM | POA: Diagnosis not present

## 2017-04-29 DIAGNOSIS — N390 Urinary tract infection, site not specified: Secondary | ICD-10-CM

## 2017-04-29 DIAGNOSIS — R319 Hematuria, unspecified: Secondary | ICD-10-CM | POA: Diagnosis not present

## 2017-04-29 DIAGNOSIS — D696 Thrombocytopenia, unspecified: Secondary | ICD-10-CM

## 2017-04-29 LAB — ECHOCARDIOGRAM COMPLETE
Height: 65 in
Weight: 2400 oz

## 2017-04-29 LAB — CBC
HCT: 40.7 % (ref 36.0–46.0)
Hemoglobin: 13.6 g/dL (ref 12.0–15.0)
MCH: 29.9 pg (ref 26.0–34.0)
MCHC: 33.4 g/dL (ref 30.0–36.0)
MCV: 89.5 fL (ref 78.0–100.0)
Platelets: 100 10*3/uL — ABNORMAL LOW (ref 150–400)
RBC: 4.55 MIL/uL (ref 3.87–5.11)
RDW: 13.4 % (ref 11.5–15.5)
WBC: 11 10*3/uL — ABNORMAL HIGH (ref 4.0–10.5)

## 2017-04-29 LAB — PROTIME-INR
INR: 1.56
Prothrombin Time: 18.5 seconds — ABNORMAL HIGH (ref 11.4–15.2)

## 2017-04-29 LAB — COMPREHENSIVE METABOLIC PANEL
ALT: 19 U/L (ref 14–54)
AST: 24 U/L (ref 15–41)
Albumin: 3.1 g/dL — ABNORMAL LOW (ref 3.5–5.0)
Alkaline Phosphatase: 32 U/L — ABNORMAL LOW (ref 38–126)
Anion gap: 10 (ref 5–15)
BUN: 17 mg/dL (ref 6–20)
CO2: 25 mmol/L (ref 22–32)
Calcium: 8.8 mg/dL — ABNORMAL LOW (ref 8.9–10.3)
Chloride: 102 mmol/L (ref 101–111)
Creatinine, Ser: 1.14 mg/dL — ABNORMAL HIGH (ref 0.44–1.00)
GFR calc Af Amer: 50 mL/min — ABNORMAL LOW (ref >60)
GFR calc non Af Amer: 44 mL/min — ABNORMAL LOW (ref >60)
Glucose, Bld: 154 mg/dL — ABNORMAL HIGH (ref 65–99)
Potassium: 3.3 mmol/L — ABNORMAL LOW (ref 3.5–5.1)
Sodium: 137 mmol/L (ref 135–145)
Total Bilirubin: 0.9 mg/dL (ref 0.3–1.2)
Total Protein: 5.9 g/dL — ABNORMAL LOW (ref 6.5–8.1)

## 2017-04-29 MED ORDER — CIPROFLOXACIN HCL 500 MG PO TABS
500.0000 mg | ORAL_TABLET | Freq: Two times a day (BID) | ORAL | Status: DC
  Administered 2017-04-29 – 2017-04-30 (×3): 500 mg via ORAL
  Filled 2017-04-29 (×3): qty 1

## 2017-04-29 MED ORDER — ENOXAPARIN SODIUM 80 MG/0.8ML ~~LOC~~ SOLN: 70.0000 mg | Freq: Every day | SUBCUTANEOUS | Status: DC

## 2017-04-29 MED ORDER — METRONIDAZOLE 500 MG PO TABS
500.0000 mg | ORAL_TABLET | Freq: Three times a day (TID) | ORAL | Status: DC
  Administered 2017-04-29 – 2017-04-30 (×3): 500 mg via ORAL
  Filled 2017-04-29 (×3): qty 1

## 2017-04-29 MED ORDER — APIXABAN 5 MG PO TABS
5.0000 mg | ORAL_TABLET | Freq: Two times a day (BID) | ORAL | Status: DC
  Administered 2017-04-29 – 2017-04-30 (×3): 5 mg via ORAL
  Filled 2017-04-29 (×3): qty 1

## 2017-04-29 MED ORDER — SODIUM CHLORIDE 0.9 % IV SOLN
INTRAVENOUS | Status: DC
  Administered 2017-04-29: 21:00:00 via INTRAVENOUS

## 2017-04-29 MED ORDER — POTASSIUM CHLORIDE 20 MEQ PO PACK
40.0000 meq | PACK | Freq: Two times a day (BID) | ORAL | Status: AC
  Administered 2017-04-29 (×2): 40 meq via ORAL
  Filled 2017-04-29 (×2): qty 2

## 2017-04-29 NOTE — Progress Notes (Signed)
  Echocardiogram 2D Echocardiogram has been performed.  Delaina Fetsch L Androw 04/29/2017, 1:07 PM

## 2017-04-29 NOTE — Progress Notes (Signed)
RE: benefits check  Received: Today  Message Contents  Littreal, Zadie Rhine, RN        Eliquis 5mg  BID, Copay os $50. No prior auth needed. Patient uses [facility].   Thanks,   Lanelle Bal   Previous Messages    ----- Message -----  From: Cecille Rubin, RN  Sent: 04/29/2017 10:58 AM  To: [facility] Case Mgr Assistant  Subject: benefits check                  Please check eliquis 5mg  twice a day, copay and prior authorization needed. THANKS      Whitman Hero RN,BSN,CM

## 2017-04-29 NOTE — Progress Notes (Signed)
Benefits check in process for Eliquis 5 mg bid. CM to f/u with results. Whitman Hero RN, BSN.CM

## 2017-04-29 NOTE — Progress Notes (Signed)
Pt admitted from ED per stretcher accompanied by a nurse tech and pt family, on arrival to the floor pt fully alert and oriented self introduced to pt ID bracelet verified with pt vital signs are stable, fall risk assessment done , prescribed treatment given bed alarm turned on, call light and phone within reach and pt able to demonstrate how to use them when needed

## 2017-04-29 NOTE — Discharge Instructions (Signed)

## 2017-04-29 NOTE — Progress Notes (Signed)
PROGRESS NOTE    Mallory Stephens  EZM:[id number] DOB: 07-10-1933 DOA: 04/28/2017 PCP: [name], MD  Brief Narrative:  Mallory Stephens is an 83 y.o. female with medical history significant for A. fib, Hypertension, GERD, and other comorbids who  presents to the emergency department with chief complaint of generalized weakness/recent fall/nausea and vomiting and stated she "passed out." Initial evaluation reveals a urinary tract infection, diverticulitis, mild acute kidney injury/dehydration.  She states that last week she had a hot dog with onions. Shortly thereafter she developed abdominal pain with diarrhea. Associated symptoms included nausea with 2 episodes of emesis. She denies coffee ground emesis. She states she stopped eating and drinking for a while and symptoms resolved. 2 days ago she developed generalized weakness with some mild dizziness with position change. She states that yesterday she bent over to place the dog food dish on the floor she became dizzy and fell. She does not remember hitting head she did not lose consciousness. She was able to get up with the assistance of her sister but was very weak. She denies headache visual disturbances numbness tingling of extremities. She denies difficulty chewing swallowing. No report of any slurred speech. She denies chest pain palpitation shortness of breath fever chills cough. She denies lower extremity edema. She denies dysuria hematuria frequency or urgency.   In the emergency department she's afebrile blood pressure high end of normal mild tachycardia mild tachypnea no hypoxia. He is provided with fluid resuscitation Flagyl and Rocephin. She is currently being treated for UTI, Diverticulitis, and Syncope. ECHO was unrevealing.   Assessment & Plan:   Principal Problem:   Generalized weakness Active Problems:   Permanent atrial fibrillation (HCC)   Essential hypertension, benign   Long term (current) use of anticoagulants  UTI (urinary tract infection)   Fall   Nausea and vomiting   Constipation   Diverticulitis   Hypokalemia   Leukocytosis   Syncope  Syncope with Generalized Weakness/Fall likely related to dehydration secondary to decreased oral intake as well as nausea and vomiting in the setting of urinary tract infection and diverticulitis.  -Urine showed Many Bacteria and Positive Nitrites; Urine Cx Pending  -CT of the Abdomen reveals severe colonic diverticulosis and suspected mild acute diverticulitis combined with large stool burden/stool impaction. Small amount of free pelvic fluid not associated with perforation or abscess. Possibly acute superior endplate fracture of T03 not associated retropulsion nor posterior element fracture. He received IV fluids and antibiotics in the emergency department. -CT Head w/o Contrast showed Mild chronic ischemic white matter disease. Mild diffuse cortical atrophy. No acute intracranial abnormality seen. -Restarted IV fluids at 75 mL/hr -Changed Rocephin and Flagyl to po Ciprofloxacin and po Metronidazole -Obtained chest xray and showed Stable Cardiomegaly and Stable Bronchitic Changes -Clear liquid diet advanced to Soft Diet -Milk of magnesia x 3 doses -Zofran as needed for Antiemetics  -Follow urine culture -Physical Therapy Evaluation recommending Outpatient PT -Checked Orthostatics and patient was Orthostatic so fluids were restarted at 75 mL/hr -Obtain Abd Flat Plate in AM   Hypertension.  -Fair control in the emergency department. Home meds include amlodipine, losartan, metoprolol. -Hold losartan for now due to mildly elevated creatinine -Continue Metoprolol and Amlodipine -Continue to Monitor  Atrial Fibrillation. -Chadvasc score 3. Heart rate 103 in the emergency department. EKG as noted above. Improved on admission.  -Home medications include metoprolol and Coumadin. INR 1.5. -Coumadin per pharmacy discontinued given interaction with Abx. Will  change to Apixaban; 30-Day Free Card will  be given and Cost will be $50 Monthly  -Continue Metoprolol 25 mg po BID -Continue to Monitor   UTI and Diverticulitis.  -Urinalysis with Positive Bacteria and Nitrites. CT as noted above. -Clear liquid diet and advanced to Full Liquid Diet -Supportive therapy with IVF Rehydration -Flagyl and Rocephin changed to po Flagyl and Ciprofloxacin  -Follow Urine Culture; Currently Pending   Hypokalemia -Patient's K+ was 3.3 -Replete with po KCl 40 mEQ BID -Continue to Monitor and Replete as Necessary -Repeat CMP in AM  Leukocytosis -Mild at 11.0; Suspect from UTI and Diverticulitis  -Continue to Monitor and repeat CBC in AM  Thrombocytopenia -Mild -Continue to Monitor for S/Sx of Bleeding -Repeat CBC in AM   DVT prophylaxis: Anticoagulated with Apixaban  Code Status: FULL CODE Family Communication: Discussed with Daughter at bedside Disposition Plan: Anticipate D/C in AM with outpatient PT  Consultants:   None   Procedures:  ECHOCARDIOGRAM ------------------------------------------------------------------- Study Conclusions  - Left ventricle: The cavity size was normal. Systolic function was   normal. The estimated ejection fraction was in the range of 60%   to 65%. Wall motion was normal; there were no regional wall   motion abnormalities. The study is not technically sufficient to   allow evaluation of LV diastolic function. - Aortic valve: There was mild regurgitation. - Mitral valve: Calcified annulus. Mildly thickened leaflets . - Left atrium: The atrium was moderately dilated. - Right atrium: The atrium was moderately dilated. - Atrial septum: No defect or patent foramen ovale was identified. - Pulmonary arteries: PA peak pressure: 33 mm Hg (S).  Antimicrobials:  Anti-infectives    Start     Dose/Rate Route Frequency Ordered Stop   04/29/17 1900  metroNIDAZOLE (FLAGYL) tablet 500 mg     500 mg Oral Every 8 hours  04/29/17 1229 05/08/17 2159   04/29/17 1600  cefTRIAXone (ROCEPHIN) 1 g in dextrose 5 % 50 mL IVPB  Status:  Discontinued     1 g 100 mL/hr over 30 Minutes Intravenous Every 24 hours 04/28/17 1644 04/29/17 1229   04/29/17 1300  ciprofloxacin (CIPRO) tablet 500 mg     500 mg Oral 2 times daily 04/29/17 1229 05/08/17 0759   04/29/17 0000  metroNIDAZOLE (FLAGYL) IVPB 500 mg  Status:  Discontinued     500 mg 100 mL/hr over 60 Minutes Intravenous Every 8 hours 04/28/17 2119 04/29/17 1229   04/28/17 2130  metroNIDAZOLE (FLAGYL) IVPB 500 mg  Status:  Discontinued     500 mg 100 mL/hr over 60 Minutes Intravenous Every 8 hours 04/28/17 1644 04/28/17 2119   04/28/17 1530  cefTRIAXone (ROCEPHIN) 2 g in dextrose 5 % 50 mL IVPB     2 g 100 mL/hr over 30 Minutes Intravenous  Once 04/28/17 1516 04/28/17 1617   04/28/17 1530  metroNIDAZOLE (FLAGYL) IVPB 500 mg     500 mg 100 mL/hr over 60 Minutes Intravenous  Once 04/28/17 1516 04/28/17 1617     Subjective: Seen and examined at bedside and had no complaints and wanted to go home but was weak. No CP or SOB. No Abdominal Pain. Wanted to eat more than a clear liquid diet. No other concerns or complaints at this time.   Objective: Vitals:   04/28/17 2015 04/28/17 2100 04/29/17 0525 04/29/17 1413  BP: (!) 105/52 104/69 (!) 154/67 (!) 119/44  Pulse: 99 98 (!) 106 86  Resp: 20 20 18 18   Temp:  99.1 F (37.3 C) 99.3 F (37.4 C) 98.4 F (  36.9 C)  TempSrc:  Oral Oral Oral  SpO2: 91% 94% 93% 91%  Weight:      Height:       No intake or output data in the 24 hours ending 04/29/17 1922 Filed Weights   04/28/17 1038  Weight: 68 kg (150 lb)   Examination: Physical Exam:  Constitutional:  NAD and appears calm and comfortable Eyes:  Lids and conjunctivae normal, sclerae anicteric  ENMT: External Ears, Nose appear normal. Grossly normal hearing. Mucous membranes are moist.  Neck: Appears normal, supple, no cervical masses, normal ROM, no appreciable  thyromegaly, no JVD Respiratory: Diminished to auscultation bilaterally, no wheezing, rales, rhonchi or crackles. Normal respiratory effort and patient is not tachypenic. No accessory muscle use.  Cardiovascular: Irregularly Irregular but not tachycardic, no murmurs / rubs / gallops. S1 and S2 auscultated. No extremity edema. .  Abdomen: Soft, non-tender, non-distended. No masses palpated. No appreciable hepatosplenomegaly. Bowel sounds positive.  GU: Deferred. Musculoskeletal: No clubbing / cyanosis of digits/nails. No joint deformity upper and lower extremities.  Skin: No rashes, lesions, ulcers on a limited skin eval. No induration; Warm and dry.  Neurologic: CN 2-12 grossly intact with no focal deficits. Romberg sign cerebellar reflexes not assessed.  Psychiatric: Normal judgment and insight. Alert and oriented x 3. Normal mood and appropriate affect.   Data Reviewed: I have personally reviewed following labs and imaging studies  CBC:  Recent Labs Lab 04/28/17 1023 04/29/17 0454  WBC 9.0 11.0*  HGB 14.7 13.6  HCT 42.8 40.7  MCV 88.2 89.5  PLT 86* 130*   Basic Metabolic Panel:  Recent Labs Lab 04/28/17 1055 04/29/17 0454  NA 135 137  K 3.5 3.3*  CL 102 102  CO2 26 25  GLUCOSE 128* 154*  BUN 17 17  CREATININE 1.18* 1.14*  CALCIUM 9.5 8.8*   GFR: Estimated Creatinine Clearance: 34.2 mL/min (A) (by C-G formula based on SCr of 1.14 mg/dL (H)). Liver Function Tests:  Recent Labs Lab 04/28/17 1055 04/29/17 0454  AST 27 24  ALT 20 19  ALKPHOS 34* 32*  BILITOT 1.2 0.9  PROT 6.0* 5.9*  ALBUMIN 3.5 3.1*   No results for input(s): LIPASE, AMYLASE in the last 168 hours. No results for input(s): AMMONIA in the last 168 hours. Coagulation Profile:  Recent Labs Lab 04/28/17 1023 04/29/17 0454  INR 1.53 1.56   Cardiac Enzymes: No results for input(s): CKTOTAL, CKMB, CKMBINDEX, TROPONINI in the last 168 hours. BNP (last 3 results) No results for input(s): PROBNP  in the last 8760 hours. HbA1C: No results for input(s): HGBA1C in the last 72 hours. CBG: No results for input(s): GLUCAP in the last 168 hours. Lipid Profile: No results for input(s): CHOL, HDL, LDLCALC, TRIG, CHOLHDL, LDLDIRECT in the last 72 hours. Thyroid Function Tests: No results for input(s): TSH, T4TOTAL, FREET4, T3FREE, THYROIDAB in the last 72 hours. Anemia Panel: No results for input(s): VITAMINB12, FOLATE, FERRITIN, TIBC, IRON, RETICCTPCT in the last 72 hours. Sepsis Labs: No results for input(s): PROCALCITON, LATICACIDVEN in the last 168 hours.  No results found for this or any previous visit (from the past 240 hour(s)).   Radiology Studies: Ct Head Wo Contrast  Result Date: 04/28/2017 CLINICAL DATA:  Altered level of consciousness. EXAM: CT HEAD WITHOUT CONTRAST TECHNIQUE: Contiguous axial images were obtained from the base of the skull through the vertex without intravenous contrast. COMPARISON:  CT scan of October 23, 2015. FINDINGS: Brain: Mild chronic ischemic white matter disease is noted. Mild  diffuse cortical atrophy is noted. No mass effect or midline shift is noted. Ventricular size is within normal limits. There is no evidence of mass lesion, hemorrhage or acute infarction. Vascular: No hyperdense vessel or unexpected calcification. Skull: Normal. Negative for fracture or focal lesion. Sinuses/Orbits: No acute finding. Other: None. IMPRESSION: Mild chronic ischemic white matter disease. Mild diffuse cortical atrophy. No acute intracranial abnormality seen. Electronically Signed   By: Marijo Conception, M.D.   On: 04/28/2017 11:46   Ct Abdomen Pelvis W Contrast  Result Date: 04/28/2017 CLINICAL DATA:  CT A/P with contrast due to abd distension Pt BIB [facility] from home where pt lives with sister. Pt and sister report fall yesterday. C/O left hip pain, left ankle pain and NVD X2 days. Pt reports to EMS she is dizzy with standing. EXAM: CT ABDOMEN AND PELVIS WITH CONTRAST  TECHNIQUE: Multidetector CT imaging of the abdomen and pelvis was performed using the standard protocol following bolus administration of intravenous contrast. CONTRAST:  45mL ISOVUE-300 IOPAMIDOL (ISOVUE-300) INJECTION 61% COMPARISON:  CT of the pelvis 05/05/2010 ; chest x-ray 10/23/2015 FINDINGS: Lower chest: The heart is enlarged, with prominent right and left atrium. There is mitral annulus calcification. No pericardial effusion. 11 mm calcified granuloma is present in the right lower lobe. Hepatobiliary: Status post cholecystectomy. There is postsurgical dilatation of the intra and extrahepatic bile ducts. No suspicious liver lesions. Pancreas: Unremarkable. No pancreatic ductal dilatation or surrounding inflammatory changes. Spleen: Left upper quadrant splenule. Adrenals/Urinary Tract: Normal appearance of the adrenal glands. Stomach/Bowel: Normal appearance of the stomach and small bowel loops. The appendix is well seen and has a normal appearance. There is severe sigmoid diverticulosis. Small amount of fluid is identified adjacent to the sigmoid colon. No associated abscess or perforation. There is a large amount of stool throughout colonic loops, possibly contributing to the patient's symptoms. Vascular/Lymphatic: There is atherosclerotic calcification of the abdominal aorta not associated with aneurysm. Although involved by atherosclerosis, there is vascular opacification of the celiac axis, superior mesenteric artery, and inferior mesenteric artery. Normal appearance of the portal venous system and inferior vena cava. Reproductive: Uterus is present and partially calcified. No adnexal mass. Other: Trace free pelvic fluid. Musculoskeletal: There is a stable wedge compression fracture of L1. There is a superior endplate fracture C14, associated with anterior cortical bulge. There is no retropulsion of fracture fragments nor extension of the fracture into the posterior elements. Although strictly  indeterminate age, and this could represent acute fracture. IMPRESSION: 1. Severe colonic diverticulosis and suspected mild acute diverticulitis combined with large stool burden/stool impaction. Small amount of free pelvic fluid not associated with perforation or abscess. 2. Possibly acute superior endplate fracture of G81 not associated retropulsion nor posterior element fracture. 3. Stable wedge compression fracture of L1. 4. Cardiomegaly. 5. Mitral annulus calcification. 6. Status post cholecystectomy. 7. Normal appendix. 8.  Aortic atherosclerosis.  (ICD10-I70.0) Electronically Signed   By: Nolon Nations M.D.   On: 04/28/2017 14:59   Dg Chest Port 1 View  Result Date: 04/28/2017 CLINICAL DATA:  Pt c/o weakness and nausea x 2 days. Hx of HTN, GERD, AFIB, AND pulmonary nodules. Pt is a former smoker. EXAM: PORTABLE CHEST 1 VIEW COMPARISON:  10/23/2015 FINDINGS: Heart is enlarged. There are no focal consolidations or pleural effusions. No pulmonary edema. Perihilar bronchitic changes are stable. Right lower lobe calcified granuloma is partially obscured. IMPRESSION: 1. Stable cardiomegaly. 2. Stable bronchitic changes. Electronically Signed   By: Nolon Nations M.D.  On: 04/28/2017 17:56   Dg Hip Unilat W Or Wo Pelvis 2-3 Views Left  Result Date: 04/28/2017 CLINICAL DATA:  Status post fall at home yesterday striking a hardwood floor. Persistent inter lateral left hip pain. EXAM: DG HIP (WITH OR WITHOUT PELVIS) 2-3V LEFT COMPARISON:  No recent studies in [facility] FINDINGS: The bony pelvis is subjectively adequately mineralized. AP and lateral views of the left hip reveal preservation of the joint space. The articular surfaces of the femoral head and acetabulum remains smoothly rounded. The femoral neck, intertrochanteric, and subtrochanteric regions are normal. IMPRESSION: There is no acute or significant chronic bony abnormality of the left hip. The bony pelvis is grossly intact as well. Electronically  Signed   By: David  Martinique M.D.   On: 04/28/2017 11:24   Scheduled Meds: . amLODipine  5 mg Oral Daily  . apixaban  5 mg Oral BID  . ciprofloxacin  500 mg Oral BID  . darifenacin  7.5 mg Oral Daily  . magnesium hydroxide  30 mL Oral Daily  . metoprolol tartrate  25 mg Oral BID  . metroNIDAZOLE  500 mg Oral Q8H  . PARoxetine  10 mg Oral BH-q7a  . potassium chloride  40 mEq Oral BID  . pravastatin  20 mg Oral QHS  . traMADol  50 mg Oral Q6H   Continuous Infusions: . sodium chloride      LOS: 0 days   Kerney Elbe, DO [facility] Pager [phone]  If 7PM-7AM, please contact night-coverage [email or web address] Password [facility] 04/29/2017, 7:22 PM

## 2017-04-29 NOTE — Evaluation (Signed)
Physical Therapy Evaluation Patient Details Name: Mallory Stephens MRN: [id number] DOB: 21-Jul-1933 Today's Date: 04/29/2017   History of Present Illness  Pt is an 83 y.o. female with medical history significant for A. fib, hypertension, GERD, presents to the emergency department with chief complaint of generalized weakness/recent fall/nausea and vomiting. Initial evaluation reveals a urinary tract infection, diverticulitis, mild acute kidney injury/dehydration.  Clinical Impression  Pt admitted with/for generalized weakness. Pt is not at baseline function and still shows general weakness and fatigue.  Pt currently limited functionally due to the problems listed below.  (see problems list.)  Pt will benefit from PT to maximize function and safety to be able to get home safely with available assist.     Follow Up Recommendations Outpatient PT    Equipment Recommendations  None recommended by PT    Recommendations for Other Services       Precautions / Restrictions Precautions Precautions: Fall      Mobility  Bed Mobility Overal bed mobility: Needs Assistance Bed Mobility: Rolling;Sidelying to Sit;Sit to Supine Rolling: Min assist Sidelying to sit: Mod assist   Sit to supine: Min assist (LE's)   General bed mobility comments: If doesn't use a rail needs mod assist, min with heavy use of the rail  Transfers Overall transfer level: Needs assistance   Transfers: Sit to/from Stand Sit to Stand: Min guard;Min assist (min from lower surface)         General transfer comment: needed minor assist to come forward and boost  Ambulation/Gait Ambulation/Gait assistance: Min guard Ambulation Distance (Feet): 160 Feet (10' in the room) Assistive device: Rolling walker (2 wheeled) Gait Pattern/deviations: Step-through pattern Gait velocity: slower Gait velocity interpretation: Below normal speed for age/gender General Gait Details: mildly shaky/ unsteady at times.  Weak gait  mostly L LE, though no LOB  Stairs            Wheelchair Mobility    Modified Rankin (Stroke Patients Only)       Balance Overall balance assessment: Needs assistance   Sitting balance-Leahy Scale: Fair Sitting balance - Comments: tends to list posterior with challenge or fatigue   Standing balance support: No upper extremity supported Standing balance-Leahy Scale: Fair Standing balance comment: washed and dried hands at sink without need to hold onto RW or sink.                             Pertinent Vitals/Pain Pain Assessment: No/denies pain    Home Living Family/patient expects to be discharged to:: Private residence Living Arrangements: Other relatives;Other (Comment) (sister) Available Help at Discharge: Family;Available 24 hours/day Type of Home: House Home Access: Stairs to enter Entrance Stairs-Rails: Right;Left Entrance Stairs-Number of Steps: several Home Layout: Two level;Able to live on main level with bedroom/bathroom Home Equipment: Walker - 4 wheels;Cane - single point;Grab bars - tub/shower      Prior Function Level of Independence: Independent;Independent with assistive device(s)         Comments: use rollator when goes shopping     Hand Dominance        Extremity/Trunk Assessment   Upper Extremity Assessment Upper Extremity Assessment: Generalized weakness    Lower Extremity Assessment Lower Extremity Assessment: Generalized weakness;RLE deficits/detail;LLE deficits/detail RLE Deficits / Details: grossly >4/5 LLE Deficits / Details: grossly 3+/5       Communication   Communication: No difficulties  Cognition Arousal/Alertness: Awake/alert Behavior During Therapy: WFL for tasks assessed/performed Overall Cognitive  Status: Within Functional Limits for tasks assessed                                        General Comments      Exercises     Assessment/Plan    PT Assessment Patient needs  continued PT services  PT Problem List Decreased strength;Decreased activity tolerance;Decreased balance;Decreased mobility       PT Treatment Interventions Gait training;Functional mobility training;Therapeutic activities;Balance training;Patient/family education;DME instruction    PT Goals (Current goals can be found in the Care Plan section)  Acute Rehab PT Goals Patient Stated Goal: get home and Independent PT Goal Formulation: With patient Time For Goal Achievement: 05/13/17 Potential to Achieve Goals: Good    Frequency Min 3X/week   Barriers to discharge        Co-evaluation               AM-PAC PT "6 Clicks" Daily Activity  Outcome Measure Difficulty turning over in bed (including adjusting bedclothes, sheets and blankets)?: Unable Difficulty moving from lying on back to sitting on the side of the bed? : Unable Difficulty sitting down on and standing up from a chair with arms (e.g., wheelchair, bedside commode, etc,.)?: A Little Help needed moving to and from a bed to chair (including a wheelchair)?: A Little Help needed walking in hospital room?: A Little Help needed climbing 3-5 steps with a railing? : A Little 6 Click Score: 14    End of Session   Activity Tolerance: Patient tolerated treatment well;Patient limited by fatigue Patient left: in bed;with call bell/phone within reach;with bed alarm set Nurse Communication: Mobility status PT Visit Diagnosis: Unsteadiness on feet (R26.81);Other abnormalities of gait and mobility (R26.89);Muscle weakness (generalized) (M62.81)    Time: 1448-1856 PT Time Calculation (min) (ACUTE ONLY): 44 min   Charges:   PT Evaluation $PT Eval Moderate Complexity: 1 Mod PT Treatments $Gait Training: 8-22 mins   PT G Codes:   PT G-Codes **NOT FOR INPATIENT CLASS** Functional Assessment Tool Used: AM-PAC 6 Clicks Basic Mobility;Clinical judgement Functional Limitation: Mobility: Walking and moving around Mobility: Walking  and Moving Around Goal Status (D1497): At least 1 percent but less than 20 percent impaired, limited or restricted Mobility: Walking and Moving Around Discharge Status [id number]): At least 1 percent but less than 20 percent impaired, limited or restricted    04/29/2017  Donnella Sham, PT [phone]  (pager)  Tessie Fass Meridee Branum 04/29/2017, 5:48 PM

## 2017-04-29 NOTE — Progress Notes (Signed)
   04/29/17 1100  Clinical Encounter Type  Visited With Patient  Visit Type Initial  Spiritual Encounters  Spiritual Needs Prayer  Stress Factors  Patient Stress Factors None identified  Family Stress Factors None identified  Patient has birthday today!  She is excited to be going home.  Had prayer with her and sang happy birthday! Conard Novak, Chaplain

## 2017-04-29 NOTE — Progress Notes (Addendum)
  _________________________________________________________________________ Addendum: During rounds MD decided to change from warfarin to apixaban given multiple drug-drug interactions with new antibiotics (flagly and cipro). Her INR was subtherapeutic and MD felt it was better to transition to NOAC even for one month to reduce adverse events.  Georga Bora, PharmD Clinical Pharmacist 04/29/2017 12:34 PM ____________________________________________________________________________    ANTICOAGULATION CONSULT NOTE - Initial Consult  Pharmacy Consult for Coumadin Indication: atrial fibrillation  Allergies  Allergen Reactions  . Sulfa Drugs Cross Reactors Rash   Patient Measurements: Height: 5\' 5"  (165.1 cm) Weight: 150 lb (68 kg) IBW/kg (Calculated) : 57  Vital Signs: Temp: 99.3 F (37.4 C) (09/26 0525) Temp Source: Oral (09/26 0525) BP: 154/67 (09/26 0525) Pulse Rate: 106 (09/26 0525)  Labs:  Recent Labs  04/28/17 1023 04/28/17 1055 04/29/17 0454  HGB 14.7  --  13.6  HCT 42.8  --  40.7  PLT 86*  --  100*  LABPROT 18.3*  --  18.5*  INR 1.53  --  1.56  CREATININE  --  1.18* 1.14*   Estimated Creatinine Clearance: 34.2 mL/min (A) (by C-G formula based on SCr of 1.14 mg/dL (H)).  Medical History: Past Medical History:  Diagnosis Date  . Dyslipidemia   . Edema extremities 09/03/2016  . Fatty liver   . GERD (gastroesophageal reflux disease)   . Hypertension   . Lymphocytosis    followed by Dr. Julien Nordmann  . Multinodular goiter   . Osteoarthritis   . Permanent atrial fibrillation (HCC)    atrial fibrillation s/p DCCV 3/10 and 6/10  . PONV (postoperative nausea and vomiting)   . PUD (peptic ulcer disease)   . Pulmonary nodules    followed  by Dr. Stephanie Acre  . UTI (urinary tract infection) 04/2017   Assessment: 83yof on coumadin pta afib, admitted s/p fall. INR on admit subtherapeutic at 1.53. CT head negative for bleed. Hip xray negative. No plans for surgery and  coumadin to continue. She is starting on flagyl for diverticulitis so will need to watch for drug interaction. CBC stable, HgB WNL, PLT 100  INR subtherapeutic: 1.56 - No bleeding documented in chart  Home dose: 2.5mg  every other day - last taken 9/24  Goal of Therapy:  INR 2-3 Monitor platelets by anticoagulation protocol: Yes   Plan:  1) Warfarin 2.5mg  X1 tonight 2) Daily INR/CBC 3) Monitor for s/sx of bleeding  Zaccheaus Storlie L Christinamarie Tall 04/29/2017,8:55 AM

## 2017-04-30 DIAGNOSIS — I482 Chronic atrial fibrillation: Secondary | ICD-10-CM | POA: Diagnosis not present

## 2017-04-30 DIAGNOSIS — D696 Thrombocytopenia, unspecified: Secondary | ICD-10-CM | POA: Diagnosis not present

## 2017-04-30 DIAGNOSIS — I10 Essential (primary) hypertension: Secondary | ICD-10-CM | POA: Diagnosis not present

## 2017-04-30 DIAGNOSIS — W19XXXA Unspecified fall, initial encounter: Secondary | ICD-10-CM | POA: Diagnosis not present

## 2017-04-30 DIAGNOSIS — K5900 Constipation, unspecified: Secondary | ICD-10-CM | POA: Diagnosis not present

## 2017-04-30 DIAGNOSIS — R112 Nausea with vomiting, unspecified: Secondary | ICD-10-CM | POA: Diagnosis not present

## 2017-04-30 DIAGNOSIS — R531 Weakness: Secondary | ICD-10-CM | POA: Diagnosis not present

## 2017-04-30 DIAGNOSIS — E876 Hypokalemia: Secondary | ICD-10-CM | POA: Diagnosis not present

## 2017-04-30 DIAGNOSIS — K5792 Diverticulitis of intestine, part unspecified, without perforation or abscess without bleeding: Secondary | ICD-10-CM | POA: Diagnosis not present

## 2017-04-30 DIAGNOSIS — D72829 Elevated white blood cell count, unspecified: Secondary | ICD-10-CM | POA: Diagnosis not present

## 2017-04-30 DIAGNOSIS — R55 Syncope and collapse: Secondary | ICD-10-CM | POA: Diagnosis not present

## 2017-04-30 DIAGNOSIS — N390 Urinary tract infection, site not specified: Secondary | ICD-10-CM | POA: Diagnosis not present

## 2017-04-30 LAB — COMPREHENSIVE METABOLIC PANEL
ALT: 16 U/L (ref 14–54)
AST: 19 U/L (ref 15–41)
Albumin: 2.9 g/dL — ABNORMAL LOW (ref 3.5–5.0)
Alkaline Phosphatase: 29 U/L — ABNORMAL LOW (ref 38–126)
Anion gap: 7 (ref 5–15)
BUN: 18 mg/dL (ref 6–20)
CO2: 25 mmol/L (ref 22–32)
Calcium: 8.7 mg/dL — ABNORMAL LOW (ref 8.9–10.3)
Chloride: 103 mmol/L (ref 101–111)
Creatinine, Ser: 0.9 mg/dL (ref 0.44–1.00)
GFR calc Af Amer: >60 mL/min (ref >60)
GFR calc non Af Amer: 58 mL/min — ABNORMAL LOW (ref >60)
Glucose, Bld: 103 mg/dL — ABNORMAL HIGH (ref 65–99)
Potassium: 3.5 mmol/L (ref 3.5–5.1)
Sodium: 135 mmol/L (ref 135–145)
Total Bilirubin: 1.2 mg/dL (ref 0.3–1.2)
Total Protein: 5.6 g/dL — ABNORMAL LOW (ref 6.5–8.1)

## 2017-04-30 LAB — CBC WITH DIFFERENTIAL/PLATELET
Basophils Absolute: 0 10*3/uL (ref 0.0–0.1)
Basophils Relative: 0 %
Eosinophils Absolute: 0.1 10*3/uL (ref 0.0–0.7)
Eosinophils Relative: 0 %
HCT: 36.8 % (ref 36.0–46.0)
Hemoglobin: 12.4 g/dL (ref 12.0–15.0)
Lymphocytes Relative: 28 %
Lymphs Abs: 3.2 10*3/uL (ref 0.7–4.0)
MCH: 30.2 pg (ref 26.0–34.0)
MCHC: 33.7 g/dL (ref 30.0–36.0)
MCV: 89.8 fL (ref 78.0–100.0)
Monocytes Absolute: 1 10*3/uL (ref 0.1–1.0)
Monocytes Relative: 9 %
Neutro Abs: 7.1 10*3/uL (ref 1.7–7.7)
Neutrophils Relative %: 62 %
Platelets: 111 10*3/uL — ABNORMAL LOW (ref 150–400)
RBC: 4.1 MIL/uL (ref 3.87–5.11)
RDW: 13.2 % (ref 11.5–15.5)
WBC: 11.7 10*3/uL — ABNORMAL HIGH (ref 4.0–10.5)

## 2017-04-30 LAB — PROTIME-INR
INR: 2.55
Prothrombin Time: 27.2 seconds — ABNORMAL HIGH (ref 11.4–15.2)

## 2017-04-30 LAB — MAGNESIUM: Magnesium: 1.6 mg/dL — ABNORMAL LOW (ref 1.7–2.4)

## 2017-04-30 LAB — PHOSPHORUS: Phosphorus: 1.5 mg/dL — ABNORMAL LOW (ref 2.5–4.6)

## 2017-04-30 MED ORDER — BISACODYL 5 MG PO TBEC: 5.0000 mg | DELAYED_RELEASE_TABLET | Freq: Every day | ORAL | 0 refills | Status: DC | PRN

## 2017-04-30 MED ORDER — CIPROFLOXACIN HCL 500 MG PO TABS: 500.0000 mg | ORAL_TABLET | Freq: Two times a day (BID) | ORAL | 0 refills | Status: DC

## 2017-04-30 MED ORDER — MAGNESIUM SULFATE 2 GM/50ML IV SOLN
2.0000 g | Freq: Once | INTRAVENOUS | Status: AC
  Administered 2017-04-30: 2 g via INTRAVENOUS
  Filled 2017-04-30: qty 50

## 2017-04-30 MED ORDER — DEXTROSE 5 % IV SOLN
30.0000 mmol | Freq: Once | INTRAVENOUS | Status: AC
  Administered 2017-04-30: 30 mmol via INTRAVENOUS
  Filled 2017-04-30: qty 10

## 2017-04-30 MED ORDER — APIXABAN 5 MG PO TABS: 5.0000 mg | ORAL_TABLET | Freq: Two times a day (BID) | ORAL | 0 refills | Status: DC

## 2017-04-30 MED ORDER — METRONIDAZOLE 500 MG PO TABS: 500.0000 mg | ORAL_TABLET | Freq: Three times a day (TID) | ORAL | 0 refills | Status: DC

## 2017-04-30 NOTE — Progress Notes (Signed)
Pt given discharge instructions, prescriptions, and care notes. Pt verbalized understanding AEB no further questions or concerns at this time. IV was discontinued, no redness, pain, or swelling noted at this time. Pt left the floor via wheelchair with staff in stable condition. 

## 2017-04-30 NOTE — Discharge Summary (Signed)
Physician Discharge Summary  Mallory Stephens KPT:[id number] DOB: 09-29-1932 DOA: 04/28/2017  PCP: [name], MD  Admit date: 04/28/2017 Discharge date: 04/30/2017  Admitted From: Home Disposition:  Home with Outpatient PT  Recommendations for Outpatient Follow-up:  1. Follow up with PCP in 1-2 weeks; Appointment scheduled for 05/01/17 at 1:00 pm 2. Have PCP follow up on Urine Cx and change Abx as necessary 3. Follow up with Cardiology on 05/06/17 4. Complete Course of Abx of Cipro/Flagyl for Mild Diverticulitis  5. Please obtain CMP/CBC, Mag, Phos in one week  Home Health: No  Equipment/Devices: None  Discharge Condition: Stable CODE STATUS: FULL CODE Diet recommendation: Soft Heart Healthy   Brief/Interim Summary: Mallory Stephens an 83 y.o.femalewith medical history significant for A. fib, Hypertension, GERD, and other comorbids who  presents to the emergency department with chief complaint of generalized weakness/recent fall/nausea and vomiting and stated she "passed out." Initial evaluation reveals a urinary tract infection, diverticulitis, mild acute kidney injury/dehydration.  She states that last week she had a hot dog with onions. Shortly thereafter she developed abdominal pain with diarrhea. Associated symptoms included nausea with 2 episodes of emesis. She denies coffee ground emesis. She states she stopped eating and drinking for a while and symptoms resolved. 2 days ago she developed generalized weakness with some mild dizziness with position change. She states that yesterday she bent over to place the dog food dish on the floor she became dizzy and fell. She does not remember hitting head she did not lose consciousness. She was able to get up with the assistance of her sister but was very weak. She denies headache visual disturbances numbness tingling of extremities. She denies difficulty chewing swallowing. No report of any slurred speech. She denies chest pain  palpitation shortness of breath fever chills cough. She denies lower extremity edema. She denies dysuria hematuria frequency or urgency.   In the emergency department she's afebrile blood pressure high end of normal mild tachycardia mild tachypnea no hypoxia. She was provided with fluid resuscitation Flagyl and Rocephin. She is currently being treated for UTI, Diverticulitis, and Syncope. ECHO was unrevealing. She improved and orthostatics were improved. Urine grew out >100,000 CFU of GNR. Patient was deemed medically stable to D/C Home and can follow up with PCP in AM and with Cardiology on 10/3. She will need outpatient PT.   Discharge Diagnoses:  Principal Problem:   Generalized weakness Active Problems:   Permanent atrial fibrillation (HCC)   Essential hypertension, benign   Long term (current) use of anticoagulants   UTI (urinary tract infection)   Fall   Nausea and vomiting   Constipation   Diverticulitis   Hypokalemia   Leukocytosis   Syncope  Syncope with Generalized Weakness/Fall likely related to dehydration secondary to decreased oral intake as well as nausea and vomiting in the setting of urinary tract infection and diverticulitis.  -Urine showed Many Bacteria and Positive Nitrites; Urine Cx Pending  -CT of the Abdomen reveals severe colonic diverticulosis and suspected mild acute diverticulitis combined with large stool burden/stool impaction. Small amount of free pelvic fluid not associated with perforation or abscess. Possibly acute superior endplate fracture of T70 not associated retropulsion nor posterior element fracture. He received IV fluids and antibiotics in the emergency department. -CT Head w/o Contrast showed Mild chronic ischemic white matter disease. Mild diffuse cortical atrophy. No acute intracranial abnormality seen. -Restarted IV fluids at 75 mL/hr and continued until D/C -Changed Rocephin and Flagyl to po Ciprofloxacin and po  Metronidazole -Obtained chest  Xray and showed Stable Cardiomegaly and Stable Bronchitic Changes -Clear liquid diet advanced to Soft Diet -Milk of magnesia x 3 doses -Zofran as needed for Antiemetics  -Follow urine culture and showed >100,000 GNR -Physical Therapy Evaluation recommending Outpatient PT -Checked Orthostatics yesterday and patient was Orthostatic so fluids were restarted at 75 mL/hr; Repeated Orthostatics and patient improved  -Follow up with PCP on 05/01/17 at 1:00 pm  Hypertension.  -Fair control in the emergency department. Home meds include amlodipine, losartan, metoprolol. -Held losartan for now due to mildly elevated creatinine but can continue at D/C -Continued Metoprolol and Amlodipine while hospitalized -Continue to Monitor  Atrial Fibrillation. -Chadvasc score 3. Heart rate 103 in the emergency department. EKG as noted above. Improved on admission.  -Home medications include metoprolol andCoumadin. INR 1.5. -Coumadin per pharmacy discontinued given interaction with Abx. Will change to Apixaban; 30-Day Free Card will be given and Cost will be $50 Monthly  -Defer to PCP or Cardiology to change -Continue Metoprolol 25 mg po BID -Continue to Monitor  -Patient has Cardiology Appointment 10/3  GNR UTI and Diverticulitis.  -Urinalysis with Positive Bacteria and Nitrites. CT as noted above. -Clear liquid diet and advanced to Soft Diet -Supportive therapy with IVF Rehydration with NS at 75 mL/hr -Flagyl and Rocephin changed to po Flagyl and Ciprofloxacin  -Follow Urine Culture; Showed >100,000 CFU of GNR -PCP to follow up on Sensitivities and make adjustments to Abx Regimen   Hypokalemia -Patient's K+ was 3.3 and improved to 3.5 -Replete with po KCl 40 mEQ BID -Continue to Monitor and Replete as Necessary -Repeat CMP in AM  Leukocytosis -Mild at 11.7 (was 11.0 yesterday and 9.0 the day before); Suspect from UTI and Diverticulitis however based on prior records patient has had slight WBC  chronically -Per family went to Oncologist to work up -C/w Abx -Continue to Monitor and repeat CBC in AM at PCP office  Thrombocytopenia -Mild and Plt went 100 -> 111  -Continue to Monitor for S/Sx of Bleeding -Repeat CBC in AM   Discharge Instructions  Discharge Instructions    Ambulatory referral to Physical Therapy    Complete by:  As directed    Call MD for:  difficulty breathing, headache or visual disturbances    Complete by:  As directed    Call MD for:  extreme fatigue    Complete by:  As directed    Call MD for:  hives    Complete by:  As directed    Call MD for:  persistant dizziness or light-headedness    Complete by:  As directed    Call MD for:  persistant nausea and vomiting    Complete by:  As directed    Call MD for:  redness, tenderness, or signs of infection (pain, swelling, redness, odor or green/yellow discharge around incision site)    Complete by:  As directed    Call MD for:  severe uncontrolled pain    Complete by:  As directed    Call MD for:  temperature >100.4    Complete by:  As directed    Diet - low sodium heart healthy    Complete by:  As directed    Discharge instructions    Complete by:  As directed    Follow up with PCP as an outpatient and take all medications as prescribed. If symptoms change or worsen please return to the ED for evaluation.   Increase activity slowly    Complete  by:  As directed      Allergies as of 04/30/2017      Reactions   Sulfa Drugs Cross Reactors Rash      Medication List    STOP taking these medications   warfarin 5 MG tablet Commonly known as:  COUMADIN     TAKE these medications   acetaminophen 325 MG tablet Commonly known as:  TYLENOL Take 325 mg by mouth every 6 (six) hours as needed for moderate pain.   amLODipine 5 MG tablet Commonly known as:  NORVASC Take 1 tablet (5 mg total) by mouth daily.   apixaban 5 MG Tabs tablet Commonly known as:  ELIQUIS Take 1 tablet (5 mg total) by mouth 2  (two) times daily.   bisacodyl 5 MG EC tablet Commonly known as:  DULCOLAX Take 1 tablet (5 mg total) by mouth daily as needed for moderate constipation.   ciprofloxacin 500 MG tablet Commonly known as:  CIPRO Take 1 tablet (500 mg total) by mouth 2 (two) times daily.   losartan 100 MG tablet Commonly known as:  COZAAR TAKE 1 TABLET BY MOUTH EVERY DAY What changed:  See the new instructions.   metoprolol tartrate 25 MG tablet Commonly known as:  LOPRESSOR Take 25 mg by mouth 2 (two) times daily.   metroNIDAZOLE 500 MG tablet Commonly known as:  FLAGYL Take 1 tablet (500 mg total) by mouth every 8 (eight) hours.   PARoxetine 10 MG tablet Commonly known as:  PAXIL Take 10 mg by mouth every morning.   polyvinyl alcohol 1.4 % ophthalmic solution Commonly known as:  LIQUIFILM TEARS Place 1-2 drops into both eyes daily as needed for dry eyes.   pravastatin 20 MG tablet Commonly known as:  PRAVACHOL Take 20 mg by mouth at bedtime.   ranitidine 300 MG tablet Commonly known as:  ZANTAC Take 300 mg by mouth at bedtime.   solifenacin 5 MG tablet Commonly known as:  VESICARE Take 5 mg by mouth daily.            Discharge Care Instructions        Start     Ordered   04/30/17 0000  Ambulatory referral to Physical Therapy     04/30/17 1145   04/30/17 0000  apixaban (ELIQUIS) 5 MG TABS tablet  2 times daily     04/30/17 1312   04/30/17 0000  bisacodyl (DULCOLAX) 5 MG EC tablet  Daily PRN     04/30/17 1312   04/30/17 0000  ciprofloxacin (CIPRO) 500 MG tablet  2 times daily     04/30/17 1312   04/30/17 0000  metroNIDAZOLE (FLAGYL) 500 MG tablet  Every 8 hours     04/30/17 1312   04/30/17 0000  Increase activity slowly     04/30/17 1312   04/30/17 0000  Diet - low sodium heart healthy     04/30/17 1312   04/30/17 0000  Discharge instructions    Comments:  Follow up with PCP as an outpatient and take all medications as prescribed. If symptoms change or worsen please  return to the ED for evaluation.   04/30/17 1312   04/30/17 0000  Call MD for:  temperature >100.4     04/30/17 1312   04/30/17 0000  Call MD for:  persistant nausea and vomiting     04/30/17 1312   04/30/17 0000  Call MD for:  severe uncontrolled pain     04/30/17 1312   04/30/17 0000  Call  MD for:  redness, tenderness, or signs of infection (pain, swelling, redness, odor or green/yellow discharge around incision site)     04/30/17 1312   04/30/17 0000  Call MD for:  difficulty breathing, headache or visual disturbances     04/30/17 1312   04/30/17 0000  Call MD for:  hives     04/30/17 1312   04/30/17 0000  Call MD for:  persistant dizziness or light-headedness     04/30/17 1312   04/30/17 0000  Call MD for:  extreme fatigue     04/30/17 1312     Follow-up Information    [facility].   Specialty:  Rehabilitation Why:  Referral made for outpatient Physical Therapy, office will call and set up appointment time Contact information: [address] [place] [id number] [place] [id number] [phone]       [name], MD. Call in 1 week(s).   Specialty:  Family Medicine Contact information: [address] [place] [address] [phone]          Allergies  Allergen Reactions  . Sulfa Drugs Cross Reactors Rash    Consultations:  None  Procedures/Studies: Ct Head Wo Contrast  Result Date: 04/28/2017 CLINICAL DATA:  Altered level of consciousness. EXAM: CT HEAD WITHOUT CONTRAST TECHNIQUE: Contiguous axial images were obtained from the base of the skull through the vertex without intravenous contrast. COMPARISON:  CT scan of October 23, 2015. FINDINGS: Brain: Mild chronic ischemic white matter disease is noted. Mild diffuse cortical atrophy is noted. No mass effect or midline shift is noted. Ventricular size is within normal limits. There is no evidence of mass lesion,  hemorrhage or acute infarction. Vascular: No hyperdense vessel or unexpected calcification. Skull: Normal. Negative for fracture or focal lesion. Sinuses/Orbits: No acute finding. Other: None. IMPRESSION: Mild chronic ischemic white matter disease. Mild diffuse cortical atrophy. No acute intracranial abnormality seen. Electronically Signed   By: Marijo Conception, M.D.   On: 04/28/2017 11:46   Ct Abdomen Pelvis W Contrast  Result Date: 04/28/2017 CLINICAL DATA:  CT A/P with contrast due to abd distension Pt BIB [facility] from home where pt lives with sister. Pt and sister report fall yesterday. C/O left hip pain, left ankle pain and NVD X2 days. Pt reports to EMS she is dizzy with standing. EXAM: CT ABDOMEN AND PELVIS WITH CONTRAST TECHNIQUE: Multidetector CT imaging of the abdomen and pelvis was performed using the standard protocol following bolus administration of intravenous contrast. CONTRAST:  67mL ISOVUE-300 IOPAMIDOL (ISOVUE-300) INJECTION 61% COMPARISON:  CT of the pelvis 05/05/2010 ; chest x-ray 10/23/2015 FINDINGS: Lower chest: The heart is enlarged, with prominent right and left atrium. There is mitral annulus calcification. No pericardial effusion. 11 mm calcified granuloma is present in the right lower lobe. Hepatobiliary: Status post cholecystectomy. There is postsurgical dilatation of the intra and extrahepatic bile ducts. No suspicious liver lesions. Pancreas: Unremarkable. No pancreatic ductal dilatation or surrounding inflammatory changes. Spleen: Left upper quadrant splenule. Adrenals/Urinary Tract: Normal appearance of the adrenal glands. Stomach/Bowel: Normal appearance of the stomach and small bowel loops. The appendix is well seen and has a normal appearance. There is severe sigmoid diverticulosis. Small amount of fluid is identified adjacent to the sigmoid colon. No associated abscess or perforation. There is a large amount of stool throughout colonic loops, possibly contributing to the  patient's symptoms. Vascular/Lymphatic: There is atherosclerotic calcification of the abdominal aorta not associated with aneurysm. Although involved by  atherosclerosis, there is vascular opacification of the celiac axis, superior mesenteric artery, and inferior mesenteric artery. Normal appearance of the portal venous system and inferior vena cava. Reproductive: Uterus is present and partially calcified. No adnexal mass. Other: Trace free pelvic fluid. Musculoskeletal: There is a stable wedge compression fracture of L1. There is a superior endplate fracture E31, associated with anterior cortical bulge. There is no retropulsion of fracture fragments nor extension of the fracture into the posterior elements. Although strictly indeterminate age, and this could represent acute fracture. IMPRESSION: 1. Severe colonic diverticulosis and suspected mild acute diverticulitis combined with large stool burden/stool impaction. Small amount of free pelvic fluid not associated with perforation or abscess. 2. Possibly acute superior endplate fracture of V40 not associated retropulsion nor posterior element fracture. 3. Stable wedge compression fracture of L1. 4. Cardiomegaly. 5. Mitral annulus calcification. 6. Status post cholecystectomy. 7. Normal appendix. 8.  Aortic atherosclerosis.  (ICD10-I70.0) Electronically Signed   By: Nolon Nations M.D.   On: 04/28/2017 14:59   Dg Chest Port 1 View  Result Date: 04/28/2017 CLINICAL DATA:  Pt c/o weakness and nausea x 2 days. Hx of HTN, GERD, AFIB, AND pulmonary nodules. Pt is a former smoker. EXAM: PORTABLE CHEST 1 VIEW COMPARISON:  10/23/2015 FINDINGS: Heart is enlarged. There are no focal consolidations or pleural effusions. No pulmonary edema. Perihilar bronchitic changes are stable. Right lower lobe calcified granuloma is partially obscured. IMPRESSION: 1. Stable cardiomegaly. 2. Stable bronchitic changes. Electronically Signed   By: Nolon Nations M.D.   On: 04/28/2017  17:56   Dg Hip Unilat W Or Wo Pelvis 2-3 Views Left  Result Date: 04/28/2017 CLINICAL DATA:  Status post fall at home yesterday striking a hardwood floor. Persistent inter lateral left hip pain. EXAM: DG HIP (WITH OR WITHOUT PELVIS) 2-3V LEFT COMPARISON:  No recent studies in [facility] FINDINGS: The bony pelvis is subjectively adequately mineralized. AP and lateral views of the left hip reveal preservation of the joint space. The articular surfaces of the femoral head and acetabulum remains smoothly rounded. The femoral neck, intertrochanteric, and subtrochanteric regions are normal. IMPRESSION: There is no acute or significant chronic bony abnormality of the left hip. The bony pelvis is grossly intact as well. Electronically Signed   By: David  Martinique M.D.   On: 04/28/2017 11:24   ECHOCARDIOGRAM ------------------------------------------------------------------- Study Conclusions  - Left ventricle: The cavity size was normal. Systolic function was   normal. The estimated ejection fraction was in the range of 60%   to 65%. Wall motion was normal; there were no regional wall   motion abnormalities. The study is not technically sufficient to   allow evaluation of LV diastolic function. - Aortic valve: There was mild regurgitation. - Mitral valve: Calcified annulus. Mildly thickened leaflets . - Left atrium: The atrium was moderately dilated. - Right atrium: The atrium was moderately dilated. - Atrial septum: No defect or patent foramen ovale was identified. - Pulmonary arteries: PA peak pressure: 33 mm Hg (S).  Subjective: Seen and examined and was doing better. No CP or SOB. Denied any dysuria today. Abdominal Pain improved and had no N/V. Ready to go home.  Discharge Exam: Vitals:   04/29/17 2147 04/30/17 0633  BP: (!) 130/51 139/70  Pulse: (!) 105 95  Resp: 18 18  Temp: 100 F (37.8 C) 98.6 F (37 C)  SpO2: 90% 94%   Vitals:   04/29/17 0525 04/29/17 1413 04/29/17 2147 04/30/17 0633   BP: (!) 154/67 (!) 119/44 Marland Kitchen)  130/51 139/70  Pulse: (!) 106 86 (!) 105 95  Resp: 18 18 18 18   Temp: 99.3 F (37.4 C) 98.4 F (36.9 C) 100 F (37.8 C) 98.6 F (37 C)  TempSrc: Oral Oral Oral Oral  SpO2: 93% 91% 90% 94%  Weight:      Height:       General: Pt is alert, awake, not in acute distress Cardiovascular: Irregularly Irregular, S1/S2 +, no rubs, no gallops Respiratory: CTA bilaterally, no wheezing, no rhonchi Abdominal: Soft, NT, ND, bowel sounds + Extremities: no edema, no cyanosis  The results of significant diagnostics from this hospitalization (including imaging, microbiology, ancillary and laboratory) are listed below for reference.    Microbiology: Recent Results (from the past 240 hour(s))  Urine culture     Status: Abnormal (Preliminary result)   Collection Time: 04/28/17  1:10 PM  Result Value Ref Range Status   Specimen Description URINE, RANDOM  Final   Special Requests NONE  Final   Culture >=100,000 COLONIES/mL GRAM NEGATIVE RODS (A)  Final   Report Status PENDING  Incomplete    Labs: BNP (last 3 results) No results for input(s): BNP in the last 8760 hours. Basic Metabolic Panel:  Recent Labs Lab 04/28/17 1055 04/29/17 0454 04/30/17 0417  NA 135 137 135  K 3.5 3.3* 3.5  CL 102 102 103  CO2 26 25 25   GLUCOSE 128* 154* 103*  BUN 17 17 18   CREATININE 1.18* 1.14* 0.90  CALCIUM 9.5 8.8* 8.7*  MG  --   --  1.6*  PHOS  --   --  1.5*   Liver Function Tests:  Recent Labs Lab 04/28/17 1055 04/29/17 0454 04/30/17 0417  AST 27 24 19   ALT 20 19 16   ALKPHOS 34* 32* 29*  BILITOT 1.2 0.9 1.2  PROT 6.0* 5.9* 5.6*  ALBUMIN 3.5 3.1* 2.9*   No results for input(s): LIPASE, AMYLASE in the last 168 hours. No results for input(s): AMMONIA in the last 168 hours. CBC:  Recent Labs Lab 04/28/17 1023 04/29/17 0454 04/30/17 0417  WBC 9.0 11.0* 11.7*  NEUTROABS  --   --  7.1  HGB 14.7 13.6 12.4  HCT 42.8 40.7 36.8  MCV 88.2 89.5 89.8  PLT 86*  100* 111*   Cardiac Enzymes: No results for input(s): CKTOTAL, CKMB, CKMBINDEX, TROPONINI in the last 168 hours. BNP: Invalid input(s): POCBNP CBG: No results for input(s): GLUCAP in the last 168 hours. D-Dimer No results for input(s): DDIMER in the last 72 hours. Hgb A1c No results for input(s): HGBA1C in the last 72 hours. Lipid Profile No results for input(s): CHOL, HDL, LDLCALC, TRIG, CHOLHDL, LDLDIRECT in the last 72 hours. Thyroid function studies No results for input(s): TSH, T4TOTAL, T3FREE, THYROIDAB in the last 72 hours.  Invalid input(s): FREET3 Anemia work up No results for input(s): VITAMINB12, FOLATE, FERRITIN, TIBC, IRON, RETICCTPCT in the last 72 hours. Urinalysis    Component Value Date/Time   COLORURINE YELLOW 04/28/2017 1310   APPEARANCEUR CLEAR 04/28/2017 1310   LABSPEC 1.011 04/28/2017 1310   PHURINE 5.0 04/28/2017 1310   GLUCOSEU NEGATIVE 04/28/2017 1310   HGBUR MODERATE (A) 04/28/2017 1310   BILIRUBINUR NEGATIVE 04/28/2017 1310   KETONESUR NEGATIVE 04/28/2017 1310   PROTEINUR NEGATIVE 04/28/2017 1310   UROBILINOGEN 0.2 06/23/2010 1322   NITRITE POSITIVE (A) 04/28/2017 1310   LEUKOCYTESUR NEGATIVE 04/28/2017 1310   Sepsis Labs Invalid input(s): PROCALCITONIN,  WBC,  LACTICIDVEN Microbiology Recent Results (from the past 240 hour(s))  Urine culture     Status: Abnormal (Preliminary result)   Collection Time: 04/28/17  1:10 PM  Result Value Ref Range Status   Specimen Description URINE, RANDOM  Final   Special Requests NONE  Final   Culture >=100,000 COLONIES/mL GRAM NEGATIVE RODS (A)  Final   Report Status PENDING  Incomplete   Time coordinating discharge: 35 minutes  SIGNED:  Kerney Elbe, DO [facility] 04/30/2017, 1:28 PM Pager [phone]  If 7PM-7AM, please contact night-coverage [email or web address] Password [email or web address]

## 2017-04-30 NOTE — Care Management Obs Status (Signed)
[facility] NOTIFICATION   Patient Details  Name: Mallory Stephens MRN: [id number] Date of Birth: Feb 05, 1933   Medicare Observation Status Notification Given:  Yes    Sharin Mons, RN 04/30/2017, 11:48 AM

## 2017-04-30 NOTE — Progress Notes (Signed)
Physical Therapy Treatment Patient Details Name: Mallory Stephens MRN: [id number] DOB: 06/03/1933 Today's Date: 04/30/2017    History of Present Illness Pt is an 83 y.o. female with medical history significant for A. fib, hypertension, GERD, presents to the emergency department with chief complaint of generalized weakness/recent fall/nausea and vomiting. Initial evaluation reveals a urinary tract infection, diverticulitis, mild acute kidney injury/dehydration.    PT Comments    Pt progressing slowly limited by incontinence of diarrhea.  Emphasis on standing balance and activity tolerance during pericare and gait stability in the halls.   Follow Up Recommendations  Outpatient PT     Equipment Recommendations  None recommended by PT    Recommendations for Other Services       Precautions / Restrictions Precautions Precautions: Fall    Mobility  Bed Mobility Overal bed mobility: Needs Assistance Bed Mobility: Rolling;Sidelying to Sit;Sit to Supine Rolling: Min assist Sidelying to sit: Mod assist       General bed mobility comments: still needing the rail, but came up with a little less assist  Transfers Overall transfer level: Needs assistance Equipment used: Rolling walker (2 wheeled) Transfers: Sit to/from Stand Sit to Stand: Min assist         General transfer comment: assist to come forward and up.  Ambulation/Gait Ambulation/Gait assistance: Min guard Ambulation Distance (Feet): 160 Feet Assistive device: Rolling walker (2 wheeled) Gait Pattern/deviations: Step-through pattern Gait velocity: slower Gait velocity interpretation: Below normal speed for age/gender General Gait Details: similar as gait quality yesterday with a little more stability, even after spending a large amount of time with pericare for diarrhea prior to ambulation.   Stairs            Wheelchair Mobility    Modified Rankin (Stroke Patients Only)       Balance Overall  balance assessment: Needs assistance   Sitting balance-Leahy Scale: Fair     Standing balance support: No upper extremity supported Standing balance-Leahy Scale: Fair Standing balance comment: more unsteady as she fatigues.                            Cognition Arousal/Alertness: Awake/alert Behavior During Therapy: WFL for tasks assessed/performed Overall Cognitive Status: Within Functional Limits for tasks assessed                                        Exercises      General Comments General comments (skin integrity, edema, etc.): pt became fatigued during a long episode of standing for pericare with underwear and gown change post incontinence of diarrhea.      Pertinent Vitals/Pain Pain Assessment: No/denies pain    Home Living                      Prior Function            PT Goals (current goals can now be found in the care plan section) Acute Rehab PT Goals Patient Stated Goal: get home and Independent PT Goal Formulation: With patient Time For Goal Achievement: 05/13/17 Potential to Achieve Goals: Good Progress towards PT goals: Progressing toward goals    Frequency    Min 3X/week      PT Plan Current plan remains appropriate    Co-evaluation  AM-PAC PT "6 Clicks" Daily Activity  Outcome Measure  Difficulty turning over in bed (including adjusting bedclothes, sheets and blankets)?: Unable Difficulty moving from lying on back to sitting on the side of the bed? : Unable Difficulty sitting down on and standing up from a chair with arms (e.g., wheelchair, bedside commode, etc,.)?: A Little Help needed moving to and from a bed to chair (including a wheelchair)?: A Little Help needed walking in hospital room?: A Little Help needed climbing 3-5 steps with a railing? : A Little 6 Click Score: 14    End of Session   Activity Tolerance: Patient tolerated treatment well;Patient limited by  fatigue Patient left: in bed;with call bell/phone within reach;with bed alarm set Nurse Communication: Mobility status PT Visit Diagnosis: Unsteadiness on feet (R26.81);Other abnormalities of gait and mobility (R26.89)     Time: 7048-8891 PT Time Calculation (min) (ACUTE ONLY): 36 min  Charges:  $Gait Training: 8-22 mins $Therapeutic Activity: 8-22 mins                    G Codes:       06-May-2017  Donnella Sham, PT [phone]  (pager)   Mallory Stephens May 06, 2017, 6:26 PM

## 2017-05-01 ENCOUNTER — Telehealth: Payer: Self-pay | Admitting: *Deleted

## 2017-05-01 DIAGNOSIS — M549 Dorsalgia, unspecified: Secondary | ICD-10-CM | POA: Diagnosis not present

## 2017-05-01 DIAGNOSIS — E860 Dehydration: Secondary | ICD-10-CM | POA: Diagnosis not present

## 2017-05-01 DIAGNOSIS — R5381 Other malaise: Secondary | ICD-10-CM | POA: Diagnosis not present

## 2017-05-01 DIAGNOSIS — I4891 Unspecified atrial fibrillation: Secondary | ICD-10-CM | POA: Diagnosis not present

## 2017-05-01 DIAGNOSIS — B370 Candidal stomatitis: Secondary | ICD-10-CM | POA: Diagnosis not present

## 2017-05-01 DIAGNOSIS — N390 Urinary tract infection, site not specified: Secondary | ICD-10-CM | POA: Diagnosis not present

## 2017-05-01 DIAGNOSIS — K5792 Diverticulitis of intestine, part unspecified, without perforation or abscess without bleeding: Secondary | ICD-10-CM | POA: Diagnosis not present

## 2017-05-01 DIAGNOSIS — N179 Acute kidney failure, unspecified: Secondary | ICD-10-CM | POA: Diagnosis not present

## 2017-05-01 LAB — URINE CULTURE: Culture: >=100000 — AB

## 2017-05-01 MED ORDER — APIXABAN 5 MG PO TABS: 5.0000 mg | ORAL_TABLET | Freq: Two times a day (BID) | ORAL | 0 refills | Status: DC

## 2017-05-01 NOTE — Telephone Encounter (Signed)
Received a call from patients sister requesting Rx for Eliquis be sent to pharmacy as it was changed at recent hospitalization and not sent to pharmacy. Rx sent to [facility] as requested and will keep visit as scheduled next week.

## 2017-05-05 ENCOUNTER — Ambulatory Visit: Payer: Medicare HMO | Admitting: Cardiology

## 2017-05-05 DIAGNOSIS — K5792 Diverticulitis of intestine, part unspecified, without perforation or abscess without bleeding: Secondary | ICD-10-CM | POA: Diagnosis not present

## 2017-05-05 DIAGNOSIS — N189 Chronic kidney disease, unspecified: Secondary | ICD-10-CM | POA: Diagnosis not present

## 2017-05-05 DIAGNOSIS — Z7901 Long term (current) use of anticoagulants: Secondary | ICD-10-CM | POA: Diagnosis not present

## 2017-05-05 DIAGNOSIS — I482 Chronic atrial fibrillation: Secondary | ICD-10-CM | POA: Diagnosis not present

## 2017-05-05 DIAGNOSIS — I129 Hypertensive chronic kidney disease with stage 1 through stage 4 chronic kidney disease, or unspecified chronic kidney disease: Secondary | ICD-10-CM | POA: Diagnosis not present

## 2017-05-05 DIAGNOSIS — R5381 Other malaise: Secondary | ICD-10-CM | POA: Diagnosis not present

## 2017-05-05 DIAGNOSIS — M549 Dorsalgia, unspecified: Secondary | ICD-10-CM | POA: Diagnosis not present

## 2017-05-05 DIAGNOSIS — Z87891 Personal history of nicotine dependence: Secondary | ICD-10-CM | POA: Diagnosis not present

## 2017-05-05 DIAGNOSIS — K219 Gastro-esophageal reflux disease without esophagitis: Secondary | ICD-10-CM | POA: Diagnosis not present

## 2017-05-05 DIAGNOSIS — N390 Urinary tract infection, site not specified: Secondary | ICD-10-CM | POA: Diagnosis not present

## 2017-05-06 ENCOUNTER — Ambulatory Visit: Payer: Medicare HMO | Admitting: Cardiology

## 2017-05-06 DIAGNOSIS — N390 Urinary tract infection, site not specified: Secondary | ICD-10-CM | POA: Diagnosis not present

## 2017-05-06 DIAGNOSIS — Z87891 Personal history of nicotine dependence: Secondary | ICD-10-CM | POA: Diagnosis not present

## 2017-05-06 DIAGNOSIS — I482 Chronic atrial fibrillation: Secondary | ICD-10-CM | POA: Diagnosis not present

## 2017-05-06 DIAGNOSIS — K219 Gastro-esophageal reflux disease without esophagitis: Secondary | ICD-10-CM | POA: Diagnosis not present

## 2017-05-06 DIAGNOSIS — M549 Dorsalgia, unspecified: Secondary | ICD-10-CM | POA: Diagnosis not present

## 2017-05-06 DIAGNOSIS — N189 Chronic kidney disease, unspecified: Secondary | ICD-10-CM | POA: Diagnosis not present

## 2017-05-06 DIAGNOSIS — Z7901 Long term (current) use of anticoagulants: Secondary | ICD-10-CM | POA: Diagnosis not present

## 2017-05-06 DIAGNOSIS — K5792 Diverticulitis of intestine, part unspecified, without perforation or abscess without bleeding: Secondary | ICD-10-CM | POA: Diagnosis not present

## 2017-05-06 DIAGNOSIS — I129 Hypertensive chronic kidney disease with stage 1 through stage 4 chronic kidney disease, or unspecified chronic kidney disease: Secondary | ICD-10-CM | POA: Diagnosis not present

## 2017-05-06 DIAGNOSIS — R5381 Other malaise: Secondary | ICD-10-CM | POA: Diagnosis not present

## 2017-05-08 ENCOUNTER — Telehealth: Payer: Self-pay | Admitting: Cardiology

## 2017-05-08 DIAGNOSIS — M549 Dorsalgia, unspecified: Secondary | ICD-10-CM | POA: Diagnosis not present

## 2017-05-08 DIAGNOSIS — K219 Gastro-esophageal reflux disease without esophagitis: Secondary | ICD-10-CM | POA: Diagnosis not present

## 2017-05-08 DIAGNOSIS — R5381 Other malaise: Secondary | ICD-10-CM | POA: Diagnosis not present

## 2017-05-08 DIAGNOSIS — Z7901 Long term (current) use of anticoagulants: Secondary | ICD-10-CM | POA: Diagnosis not present

## 2017-05-08 DIAGNOSIS — I129 Hypertensive chronic kidney disease with stage 1 through stage 4 chronic kidney disease, or unspecified chronic kidney disease: Secondary | ICD-10-CM | POA: Diagnosis not present

## 2017-05-08 DIAGNOSIS — N189 Chronic kidney disease, unspecified: Secondary | ICD-10-CM | POA: Diagnosis not present

## 2017-05-08 DIAGNOSIS — K5792 Diverticulitis of intestine, part unspecified, without perforation or abscess without bleeding: Secondary | ICD-10-CM | POA: Diagnosis not present

## 2017-05-08 DIAGNOSIS — I482 Chronic atrial fibrillation: Secondary | ICD-10-CM | POA: Diagnosis not present

## 2017-05-08 DIAGNOSIS — Z87891 Personal history of nicotine dependence: Secondary | ICD-10-CM | POA: Diagnosis not present

## 2017-05-08 DIAGNOSIS — N390 Urinary tract infection, site not specified: Secondary | ICD-10-CM | POA: Diagnosis not present

## 2017-05-08 NOTE — Telephone Encounter (Signed)
Called patient back. Patient has given verbal permission to speak with her sister [facility]. Patient needs to reschedule OV and coumadin clinic visit. Made an appt with Cecilie Kicks NP at next available. Will forward to coumadin clinic for visit with them and advisement.

## 2017-05-08 NOTE — Telephone Encounter (Signed)
Returned call to [place], rescheduled pt's Coumadin check to 05/11/17 at 9:15am.

## 2017-05-08 NOTE — Telephone Encounter (Signed)
New message   Pt is calling asking for a call back about her medication.

## 2017-05-09 DIAGNOSIS — Z87891 Personal history of nicotine dependence: Secondary | ICD-10-CM | POA: Diagnosis not present

## 2017-05-09 DIAGNOSIS — M549 Dorsalgia, unspecified: Secondary | ICD-10-CM | POA: Diagnosis not present

## 2017-05-09 DIAGNOSIS — K5792 Diverticulitis of intestine, part unspecified, without perforation or abscess without bleeding: Secondary | ICD-10-CM | POA: Diagnosis not present

## 2017-05-09 DIAGNOSIS — K219 Gastro-esophageal reflux disease without esophagitis: Secondary | ICD-10-CM | POA: Diagnosis not present

## 2017-05-09 DIAGNOSIS — I482 Chronic atrial fibrillation: Secondary | ICD-10-CM | POA: Diagnosis not present

## 2017-05-09 DIAGNOSIS — R5381 Other malaise: Secondary | ICD-10-CM | POA: Diagnosis not present

## 2017-05-09 DIAGNOSIS — I129 Hypertensive chronic kidney disease with stage 1 through stage 4 chronic kidney disease, or unspecified chronic kidney disease: Secondary | ICD-10-CM | POA: Diagnosis not present

## 2017-05-09 DIAGNOSIS — Z7901 Long term (current) use of anticoagulants: Secondary | ICD-10-CM | POA: Diagnosis not present

## 2017-05-09 DIAGNOSIS — N390 Urinary tract infection, site not specified: Secondary | ICD-10-CM | POA: Diagnosis not present

## 2017-05-09 DIAGNOSIS — N189 Chronic kidney disease, unspecified: Secondary | ICD-10-CM | POA: Diagnosis not present

## 2017-05-11 ENCOUNTER — Ambulatory Visit (INDEPENDENT_AMBULATORY_CARE_PROVIDER_SITE_OTHER): Payer: Medicare HMO

## 2017-05-11 DIAGNOSIS — K219 Gastro-esophageal reflux disease without esophagitis: Secondary | ICD-10-CM | POA: Diagnosis not present

## 2017-05-11 DIAGNOSIS — Z7901 Long term (current) use of anticoagulants: Secondary | ICD-10-CM | POA: Diagnosis not present

## 2017-05-11 DIAGNOSIS — N189 Chronic kidney disease, unspecified: Secondary | ICD-10-CM | POA: Diagnosis not present

## 2017-05-11 DIAGNOSIS — R5381 Other malaise: Secondary | ICD-10-CM | POA: Diagnosis not present

## 2017-05-11 DIAGNOSIS — Z5181 Encounter for therapeutic drug level monitoring: Secondary | ICD-10-CM

## 2017-05-11 DIAGNOSIS — I4891 Unspecified atrial fibrillation: Secondary | ICD-10-CM | POA: Diagnosis not present

## 2017-05-11 DIAGNOSIS — Z87891 Personal history of nicotine dependence: Secondary | ICD-10-CM | POA: Diagnosis not present

## 2017-05-11 DIAGNOSIS — M549 Dorsalgia, unspecified: Secondary | ICD-10-CM | POA: Diagnosis not present

## 2017-05-11 DIAGNOSIS — K5792 Diverticulitis of intestine, part unspecified, without perforation or abscess without bleeding: Secondary | ICD-10-CM | POA: Diagnosis not present

## 2017-05-11 DIAGNOSIS — N390 Urinary tract infection, site not specified: Secondary | ICD-10-CM | POA: Diagnosis not present

## 2017-05-11 DIAGNOSIS — I129 Hypertensive chronic kidney disease with stage 1 through stage 4 chronic kidney disease, or unspecified chronic kidney disease: Secondary | ICD-10-CM | POA: Diagnosis not present

## 2017-05-11 DIAGNOSIS — I482 Chronic atrial fibrillation: Secondary | ICD-10-CM | POA: Diagnosis not present

## 2017-05-11 LAB — POCT INR: INR: 1.3

## 2017-05-11 NOTE — Patient Instructions (Signed)
A full discussion of the nature of anticoagulants has been carried out.  A benefit/risk analysis has been presented to the patient, so that they understand the justification for choosing anticoagulation with Eliquis at this time.  The need for compliance is stressed.  Pt is aware to take the medication twice daily.  Side effects of potential bleeding are discussed, including unusual colored urine or stools, coughing up blood or coffee ground emesis, nose bleeds or serious fall or head trauma.  Discussed signs and symptoms of stroke. The patient should avoid any OTC items containing aspirin or ibuprofen.  Avoid alcohol consumption.   Call if any signs of abnormal bleeding.  Discussed financial obligations and resolved any difficulty in obtaining medication.  Next lab test in 6 months.

## 2017-05-12 DIAGNOSIS — N390 Urinary tract infection, site not specified: Secondary | ICD-10-CM | POA: Diagnosis not present

## 2017-05-12 DIAGNOSIS — I129 Hypertensive chronic kidney disease with stage 1 through stage 4 chronic kidney disease, or unspecified chronic kidney disease: Secondary | ICD-10-CM | POA: Diagnosis not present

## 2017-05-12 DIAGNOSIS — Z7901 Long term (current) use of anticoagulants: Secondary | ICD-10-CM | POA: Diagnosis not present

## 2017-05-12 DIAGNOSIS — I482 Chronic atrial fibrillation: Secondary | ICD-10-CM | POA: Diagnosis not present

## 2017-05-12 DIAGNOSIS — K5792 Diverticulitis of intestine, part unspecified, without perforation or abscess without bleeding: Secondary | ICD-10-CM | POA: Diagnosis not present

## 2017-05-12 DIAGNOSIS — N189 Chronic kidney disease, unspecified: Secondary | ICD-10-CM | POA: Diagnosis not present

## 2017-05-12 DIAGNOSIS — R5381 Other malaise: Secondary | ICD-10-CM | POA: Diagnosis not present

## 2017-05-12 DIAGNOSIS — K219 Gastro-esophageal reflux disease without esophagitis: Secondary | ICD-10-CM | POA: Diagnosis not present

## 2017-05-12 DIAGNOSIS — Z87891 Personal history of nicotine dependence: Secondary | ICD-10-CM | POA: Diagnosis not present

## 2017-05-12 DIAGNOSIS — M549 Dorsalgia, unspecified: Secondary | ICD-10-CM | POA: Diagnosis not present

## 2017-05-14 DIAGNOSIS — Z87891 Personal history of nicotine dependence: Secondary | ICD-10-CM | POA: Diagnosis not present

## 2017-05-14 DIAGNOSIS — K219 Gastro-esophageal reflux disease without esophagitis: Secondary | ICD-10-CM | POA: Diagnosis not present

## 2017-05-14 DIAGNOSIS — N390 Urinary tract infection, site not specified: Secondary | ICD-10-CM | POA: Diagnosis not present

## 2017-05-14 DIAGNOSIS — R5381 Other malaise: Secondary | ICD-10-CM | POA: Diagnosis not present

## 2017-05-14 DIAGNOSIS — K5792 Diverticulitis of intestine, part unspecified, without perforation or abscess without bleeding: Secondary | ICD-10-CM | POA: Diagnosis not present

## 2017-05-14 DIAGNOSIS — I129 Hypertensive chronic kidney disease with stage 1 through stage 4 chronic kidney disease, or unspecified chronic kidney disease: Secondary | ICD-10-CM | POA: Diagnosis not present

## 2017-05-14 DIAGNOSIS — N189 Chronic kidney disease, unspecified: Secondary | ICD-10-CM | POA: Diagnosis not present

## 2017-05-14 DIAGNOSIS — I482 Chronic atrial fibrillation: Secondary | ICD-10-CM | POA: Diagnosis not present

## 2017-05-14 DIAGNOSIS — Z7901 Long term (current) use of anticoagulants: Secondary | ICD-10-CM | POA: Diagnosis not present

## 2017-05-14 DIAGNOSIS — M549 Dorsalgia, unspecified: Secondary | ICD-10-CM | POA: Diagnosis not present

## 2017-05-15 DIAGNOSIS — I482 Chronic atrial fibrillation: Secondary | ICD-10-CM | POA: Diagnosis not present

## 2017-05-15 DIAGNOSIS — N189 Chronic kidney disease, unspecified: Secondary | ICD-10-CM | POA: Diagnosis not present

## 2017-05-15 DIAGNOSIS — K5792 Diverticulitis of intestine, part unspecified, without perforation or abscess without bleeding: Secondary | ICD-10-CM | POA: Diagnosis not present

## 2017-05-15 DIAGNOSIS — Z87891 Personal history of nicotine dependence: Secondary | ICD-10-CM | POA: Diagnosis not present

## 2017-05-15 DIAGNOSIS — M549 Dorsalgia, unspecified: Secondary | ICD-10-CM | POA: Diagnosis not present

## 2017-05-15 DIAGNOSIS — Z7901 Long term (current) use of anticoagulants: Secondary | ICD-10-CM | POA: Diagnosis not present

## 2017-05-15 DIAGNOSIS — N390 Urinary tract infection, site not specified: Secondary | ICD-10-CM | POA: Diagnosis not present

## 2017-05-15 DIAGNOSIS — R5381 Other malaise: Secondary | ICD-10-CM | POA: Diagnosis not present

## 2017-05-15 DIAGNOSIS — I129 Hypertensive chronic kidney disease with stage 1 through stage 4 chronic kidney disease, or unspecified chronic kidney disease: Secondary | ICD-10-CM | POA: Diagnosis not present

## 2017-05-15 DIAGNOSIS — K219 Gastro-esophageal reflux disease without esophagitis: Secondary | ICD-10-CM | POA: Diagnosis not present

## 2017-05-16 ENCOUNTER — Other Ambulatory Visit: Payer: Self-pay | Admitting: Cardiology

## 2017-05-18 ENCOUNTER — Other Ambulatory Visit: Payer: Self-pay | Admitting: *Deleted

## 2017-05-18 DIAGNOSIS — R5381 Other malaise: Secondary | ICD-10-CM | POA: Diagnosis not present

## 2017-05-18 DIAGNOSIS — K219 Gastro-esophageal reflux disease without esophagitis: Secondary | ICD-10-CM | POA: Diagnosis not present

## 2017-05-18 DIAGNOSIS — M549 Dorsalgia, unspecified: Secondary | ICD-10-CM | POA: Diagnosis not present

## 2017-05-18 DIAGNOSIS — I482 Chronic atrial fibrillation: Secondary | ICD-10-CM | POA: Diagnosis not present

## 2017-05-18 DIAGNOSIS — N189 Chronic kidney disease, unspecified: Secondary | ICD-10-CM | POA: Diagnosis not present

## 2017-05-18 DIAGNOSIS — Z87891 Personal history of nicotine dependence: Secondary | ICD-10-CM | POA: Diagnosis not present

## 2017-05-18 DIAGNOSIS — N390 Urinary tract infection, site not specified: Secondary | ICD-10-CM | POA: Diagnosis not present

## 2017-05-18 DIAGNOSIS — K5792 Diverticulitis of intestine, part unspecified, without perforation or abscess without bleeding: Secondary | ICD-10-CM | POA: Diagnosis not present

## 2017-05-18 DIAGNOSIS — Z7901 Long term (current) use of anticoagulants: Secondary | ICD-10-CM | POA: Diagnosis not present

## 2017-05-18 DIAGNOSIS — I129 Hypertensive chronic kidney disease with stage 1 through stage 4 chronic kidney disease, or unspecified chronic kidney disease: Secondary | ICD-10-CM | POA: Diagnosis not present

## 2017-05-18 MED ORDER — APIXABAN 5 MG PO TABS: 5.0000 mg | ORAL_TABLET | Freq: Two times a day (BID) | ORAL | 6 refills | Status: DC

## 2017-05-19 DIAGNOSIS — Z87891 Personal history of nicotine dependence: Secondary | ICD-10-CM | POA: Diagnosis not present

## 2017-05-19 DIAGNOSIS — R5381 Other malaise: Secondary | ICD-10-CM | POA: Diagnosis not present

## 2017-05-19 DIAGNOSIS — K5792 Diverticulitis of intestine, part unspecified, without perforation or abscess without bleeding: Secondary | ICD-10-CM | POA: Diagnosis not present

## 2017-05-19 DIAGNOSIS — Z7901 Long term (current) use of anticoagulants: Secondary | ICD-10-CM | POA: Diagnosis not present

## 2017-05-19 DIAGNOSIS — M549 Dorsalgia, unspecified: Secondary | ICD-10-CM | POA: Diagnosis not present

## 2017-05-19 DIAGNOSIS — N390 Urinary tract infection, site not specified: Secondary | ICD-10-CM | POA: Diagnosis not present

## 2017-05-19 DIAGNOSIS — I482 Chronic atrial fibrillation: Secondary | ICD-10-CM | POA: Diagnosis not present

## 2017-05-19 DIAGNOSIS — N189 Chronic kidney disease, unspecified: Secondary | ICD-10-CM | POA: Diagnosis not present

## 2017-05-19 DIAGNOSIS — K219 Gastro-esophageal reflux disease without esophagitis: Secondary | ICD-10-CM | POA: Diagnosis not present

## 2017-05-19 DIAGNOSIS — I129 Hypertensive chronic kidney disease with stage 1 through stage 4 chronic kidney disease, or unspecified chronic kidney disease: Secondary | ICD-10-CM | POA: Diagnosis not present

## 2017-05-20 DIAGNOSIS — I129 Hypertensive chronic kidney disease with stage 1 through stage 4 chronic kidney disease, or unspecified chronic kidney disease: Secondary | ICD-10-CM | POA: Diagnosis not present

## 2017-05-20 DIAGNOSIS — I482 Chronic atrial fibrillation: Secondary | ICD-10-CM | POA: Diagnosis not present

## 2017-05-20 DIAGNOSIS — N390 Urinary tract infection, site not specified: Secondary | ICD-10-CM | POA: Diagnosis not present

## 2017-05-20 DIAGNOSIS — K219 Gastro-esophageal reflux disease without esophagitis: Secondary | ICD-10-CM | POA: Diagnosis not present

## 2017-05-20 DIAGNOSIS — N189 Chronic kidney disease, unspecified: Secondary | ICD-10-CM | POA: Diagnosis not present

## 2017-05-20 DIAGNOSIS — K5792 Diverticulitis of intestine, part unspecified, without perforation or abscess without bleeding: Secondary | ICD-10-CM | POA: Diagnosis not present

## 2017-05-20 DIAGNOSIS — M549 Dorsalgia, unspecified: Secondary | ICD-10-CM | POA: Diagnosis not present

## 2017-05-20 DIAGNOSIS — Z87891 Personal history of nicotine dependence: Secondary | ICD-10-CM | POA: Diagnosis not present

## 2017-05-20 DIAGNOSIS — R5381 Other malaise: Secondary | ICD-10-CM | POA: Diagnosis not present

## 2017-05-20 DIAGNOSIS — Z7901 Long term (current) use of anticoagulants: Secondary | ICD-10-CM | POA: Diagnosis not present

## 2017-05-21 DIAGNOSIS — M549 Dorsalgia, unspecified: Secondary | ICD-10-CM | POA: Diagnosis not present

## 2017-05-21 DIAGNOSIS — I129 Hypertensive chronic kidney disease with stage 1 through stage 4 chronic kidney disease, or unspecified chronic kidney disease: Secondary | ICD-10-CM | POA: Diagnosis not present

## 2017-05-21 DIAGNOSIS — I482 Chronic atrial fibrillation: Secondary | ICD-10-CM | POA: Diagnosis not present

## 2017-05-21 DIAGNOSIS — N189 Chronic kidney disease, unspecified: Secondary | ICD-10-CM | POA: Diagnosis not present

## 2017-05-21 DIAGNOSIS — R5381 Other malaise: Secondary | ICD-10-CM | POA: Diagnosis not present

## 2017-05-21 DIAGNOSIS — Z87891 Personal history of nicotine dependence: Secondary | ICD-10-CM | POA: Diagnosis not present

## 2017-05-21 DIAGNOSIS — K219 Gastro-esophageal reflux disease without esophagitis: Secondary | ICD-10-CM | POA: Diagnosis not present

## 2017-05-21 DIAGNOSIS — K5792 Diverticulitis of intestine, part unspecified, without perforation or abscess without bleeding: Secondary | ICD-10-CM | POA: Diagnosis not present

## 2017-05-21 DIAGNOSIS — Z7901 Long term (current) use of anticoagulants: Secondary | ICD-10-CM | POA: Diagnosis not present

## 2017-05-21 DIAGNOSIS — N390 Urinary tract infection, site not specified: Secondary | ICD-10-CM | POA: Diagnosis not present

## 2017-05-22 DIAGNOSIS — I10 Essential (primary) hypertension: Secondary | ICD-10-CM | POA: Diagnosis not present

## 2017-05-22 DIAGNOSIS — E042 Nontoxic multinodular goiter: Secondary | ICD-10-CM | POA: Diagnosis not present

## 2017-05-22 DIAGNOSIS — R69 Illness, unspecified: Secondary | ICD-10-CM | POA: Diagnosis not present

## 2017-05-22 DIAGNOSIS — K219 Gastro-esophageal reflux disease without esophagitis: Secondary | ICD-10-CM | POA: Diagnosis not present

## 2017-05-22 DIAGNOSIS — E785 Hyperlipidemia, unspecified: Secondary | ICD-10-CM | POA: Diagnosis not present

## 2017-05-22 DIAGNOSIS — Z0000 Encounter for general adult medical examination without abnormal findings: Secondary | ICD-10-CM | POA: Diagnosis not present

## 2017-05-22 DIAGNOSIS — I481 Persistent atrial fibrillation: Secondary | ICD-10-CM | POA: Diagnosis not present

## 2017-05-22 DIAGNOSIS — N183 Chronic kidney disease, stage 3 (moderate): Secondary | ICD-10-CM | POA: Diagnosis not present

## 2017-05-22 DIAGNOSIS — Z23 Encounter for immunization: Secondary | ICD-10-CM | POA: Diagnosis not present

## 2017-05-22 DIAGNOSIS — M899 Disorder of bone, unspecified: Secondary | ICD-10-CM | POA: Diagnosis not present

## 2017-05-25 DIAGNOSIS — E0590 Thyrotoxicosis, unspecified without thyrotoxic crisis or storm: Secondary | ICD-10-CM | POA: Diagnosis not present

## 2017-05-25 DIAGNOSIS — R946 Abnormal results of thyroid function studies: Secondary | ICD-10-CM | POA: Diagnosis not present

## 2017-05-29 DIAGNOSIS — E0590 Thyrotoxicosis, unspecified without thyrotoxic crisis or storm: Secondary | ICD-10-CM | POA: Diagnosis not present

## 2017-06-02 ENCOUNTER — Other Ambulatory Visit (HOSPITAL_COMMUNITY): Payer: Self-pay | Admitting: Endocrinology

## 2017-06-02 DIAGNOSIS — E0590 Thyrotoxicosis, unspecified without thyrotoxic crisis or storm: Secondary | ICD-10-CM

## 2017-06-03 ENCOUNTER — Ambulatory Visit: Payer: Medicare HMO | Admitting: Cardiology

## 2017-06-03 DIAGNOSIS — E0590 Thyrotoxicosis, unspecified without thyrotoxic crisis or storm: Secondary | ICD-10-CM | POA: Diagnosis not present

## 2017-06-04 ENCOUNTER — Encounter (INDEPENDENT_AMBULATORY_CARE_PROVIDER_SITE_OTHER): Payer: Self-pay

## 2017-06-04 ENCOUNTER — Ambulatory Visit (INDEPENDENT_AMBULATORY_CARE_PROVIDER_SITE_OTHER): Payer: Medicare HMO | Admitting: Cardiology

## 2017-06-04 ENCOUNTER — Encounter: Payer: Self-pay | Admitting: Cardiology

## 2017-06-04 VITALS — BP 122/60 | HR 64 | Ht 65.0 in | Wt 146.4 lb

## 2017-06-04 DIAGNOSIS — I482 Chronic atrial fibrillation, unspecified: Secondary | ICD-10-CM

## 2017-06-04 DIAGNOSIS — Z5181 Encounter for therapeutic drug level monitoring: Secondary | ICD-10-CM | POA: Diagnosis not present

## 2017-06-04 DIAGNOSIS — M545 Low back pain: Secondary | ICD-10-CM | POA: Diagnosis not present

## 2017-06-04 DIAGNOSIS — I10 Essential (primary) hypertension: Secondary | ICD-10-CM

## 2017-06-04 DIAGNOSIS — I4891 Unspecified atrial fibrillation: Secondary | ICD-10-CM | POA: Diagnosis not present

## 2017-06-04 DIAGNOSIS — M6281 Muscle weakness (generalized): Secondary | ICD-10-CM | POA: Diagnosis not present

## 2017-06-04 DIAGNOSIS — I4821 Permanent atrial fibrillation: Secondary | ICD-10-CM

## 2017-06-04 MED ORDER — APIXABAN 5 MG PO TABS: 5.0000 mg | ORAL_TABLET | Freq: Two times a day (BID) | ORAL | 0 refills | Status: DC

## 2017-06-04 MED ORDER — APIXABAN 5 MG PO TABS: 5.0000 mg | ORAL_TABLET | Freq: Two times a day (BID) | ORAL | 6 refills | Status: DC

## 2017-06-04 NOTE — Patient Instructions (Signed)
Medication Instructions: Your physician recommends that you continue on your current medications as directed. Please refer to the Current Medication list given to you today.  Labwork: Your physician recommends that you have lab work today: BMET, CBC, and Hepatic Function Panel  Procedures/Testing: None Ordered  Follow-Up: Your physician recommends that you schedule a follow-up appointment in: January or February 2019 with Dr. Radford Pax  If you need a refill on your cardiac medications before your next appointment, please call your pharmacy.

## 2017-06-04 NOTE — Progress Notes (Signed)
Cardiology Office Note   Date:  06/04/2017   ID:  [name], DOB [birth year], MRN [id number]  PCP:  [name], MD  Cardiologist:  Dr. Radford Pax    Chief Complaint  Patient presents with  . Atrial Fibrillation      History of Present Illness: Mallory Stephens is a 83 y.o. female who presents for chronic a fib.    She has a hx of chronic a fib and HTN.    She was hospitalized in Sept with UTI, weakness and episode of syncope prior to admit from dehydration with nausea and vomiting. She had stopped eating and drinking for awhile prior to admit. Cr was slightly elevated.      Echo in 04/2017  With EF  60-65%, No RWMA.  Mild AR, LA was moderately dilated, Rt atrium was moderately dilated.  PA pk pressure 33 mmHg similar to Jan 2017.   Today she is feeling better but with the syncope/fall she injured her back and is going to therapy today.  Her UTI is cleared after ABX.  She denies chest pain and SOB.  No lightheadedness or dizziness.  In the hospital they changed her coumadin to eliquis, we discussed at length.   She paid $50 for this month and we will refill.  If cost goes up she may need to return to coumadin.   BP is stable.     Past Medical History:  Diagnosis Date  . Dyslipidemia   . Edema extremities 09/03/2016  . Fatty liver   . GERD (gastroesophageal reflux disease)   . Hypertension   . Lymphocytosis    followed by Dr. Julien Nordmann  . Multinodular goiter   . Osteoarthritis   . Permanent atrial fibrillation (HCC)    atrial fibrillation s/p DCCV 3/10 and 6/10  . PONV (postoperative nausea and vomiting)   . PUD (peptic ulcer disease)   . Pulmonary nodules    followed  by Dr. Stephanie Acre  . UTI (urinary tract infection) 04/2017    Past Surgical History:  Procedure Laterality Date  . aspiration of cyst  1978  . [place]   right breast for benign lesion  . BREAST SURGERY  1971   bilateral lumpectomy for beingn lesions  . CARDIOVERSION  07/22/2011   Procedure: CARDIOVERSION;  Surgeon: [name], MD;  Location: [facility];  Service: Cardiovascular;  Laterality: N/A;  . CARDIOVERSION  09/02/2011   Procedure: CARDIOVERSION;  Surgeon: [name], MD;  Location: [facility];  Service: Cardiovascular;  Laterality: N/A;  . CHOLECYSTECTOMY  2003  . CYSTOSCOPY    . DILATION AND CURETTAGE OF UTERUS  1979  . JOINT REPLACEMENT  2002   Right knee  . JOINT REPLACEMENT  2007   left knee     Current Outpatient Prescriptions  Medication Sig Dispense Refill  . acetaminophen (TYLENOL) 325 MG tablet Take 325 mg by mouth every 6 (six) hours as needed for moderate pain.    Marland Kitchen amLODipine (NORVASC) 5 MG tablet Take 1 tablet (5 mg total) by mouth daily. 90 tablet 2  . apixaban (ELIQUIS) 5 MG TABS tablet Take 1 tablet (5 mg total) by mouth 2 (two) times daily. 60 tablet 6  . bisacodyl (DULCOLAX) 5 MG EC tablet Take 1 tablet (5 mg total) by mouth daily as needed for moderate constipation. 30 tablet 0  . losartan (COZAAR) 100 MG tablet TAKE 1 TABLET BY MOUTH EVERY DAY 90 tablet 1  . metoprolol tartrate (LOPRESSOR) 25  MG tablet Take 25 mg by mouth 2 (two) times daily.    Marland Kitchen PARoxetine (PAXIL) 10 MG tablet Take 10 mg by mouth every morning.     . polyvinyl alcohol (LIQUIFILM TEARS) 1.4 % ophthalmic solution Place 1-2 drops into both eyes daily as needed for dry eyes.    . pravastatin (PRAVACHOL) 20 MG tablet Take 20 mg by mouth at bedtime.     . ranitidine (ZANTAC) 300 MG tablet Take 300 mg by mouth at bedtime.     . solifenacin (VESICARE) 5 MG tablet Take 5 mg by mouth daily.     No current facility-administered medications for this visit.     Allergies:   Sulfa drugs cross reactors    Social History:  The patient  reports that she quit smoking about 27 years ago. She has never used smokeless tobacco. She reports that she drinks alcohol. She reports that she does not use drugs.   Family History:  The patient's  'family history includes Arthritis in her sister;  Breast cancer in her sister; Heart disease in her brother; Prostate cancer in her brother and brother; Scoliosis in her sister; Stroke in her father and mother.    ROS:  General:no colds or fevers, + weight loss due to UTI Skin:no rashes or ulcers HEENT:no blurred vision, no congestion CV:see HPI PUL:see HPI GI:no diarrhea constipation or melena, no indigestion GU:no hematuria, no dysuria MS:no joint pain, no claudication, + back pain Neuro:no syncope, no lightheadedness Endo:no diabetes, no thyroid disease  Wt Readings from Last 3 Encounters:  06/04/17 146 lb 6.4 oz (66.4 kg)  04/28/17 150 lb (68 kg)  09/03/16 150 lb (68 kg)     PHYSICAL EXAM: VS:  BP 122/60   Pulse 64   Ht 5\' 5"  (1.651 m)   Wt 146 lb 6.4 oz (66.4 kg)   SpO2 96%   BMI 24.36 kg/m  , BMI Body mass index is 24.36 kg/m. General:Pleasant affect, NAD Skin:Warm and dry, brisk capillary refill HEENT:normocephalic, sclera clear, mucus membranes moist Neck:supple, no JVD, no bruits  Heart:irreg irreg without murmur, gallup, rub or click Lungs:clear without rales, rhonchi, or wheezes ZOX:WRUE, non tender, + BS, do not palpate liver spleen or masses Ext:no lower ext edema, 2+ pedal pulses, 2+ radial pulses Neuro:alert and oriented x 3, MAE, follows commands, + facial symmetry    EKG:  EKG is ordered today. The ekg ordered today demonstrates a fib at 91 and no ischemic changes.     Recent Labs: 04/30/2017: ALT 16; BUN 18; Creatinine, Ser 0.90; Hemoglobin 12.4; Magnesium 1.6; Platelets 111; Potassium 3.5; Sodium 135    Lipid Panel    Component Value Date/Time   CHOL 150 02/16/2014 0752   TRIG 112.0 02/16/2014 0752   HDL 45.20 02/16/2014 0752   CHOLHDL 3 02/16/2014 0752   VLDL 22.4 02/16/2014 0752   LDLCALC 82 02/16/2014 0752       Other studies Reviewed: Additional studies/ records that were reviewed today include: . Echo 04/29/17  Study Conclusions  - Left ventricle: The cavity size was normal.  Systolic function was   normal. The estimated ejection fraction was in the range of 60%   to 65%. Wall motion was normal; there were no regional wall   motion abnormalities. The study is not technically sufficient to   allow evaluation of LV diastolic function. - Aortic valve: There was mild regurgitation. - Mitral valve: Calcified annulus. Mildly thickened leaflets . - Left atrium: The atrium was moderately  dilated. - Right atrium: The atrium was moderately dilated. - Atrial septum: No defect or patent foramen ovale was identified. - Pulmonary arteries: PA peak pressure: 33 mm Hg (S).   ASSESSMENT AND PLAN:  1.  Permanent atrial fib now on Eliquis, will check labs today CBC, CMP to see if any issues with Eliquis.  She will follow up with Dr. Radford Pax in feb.   2.  HTN essential, controlled  3.  Recent UTI now resolved after hospitalization and treatment  + fall/syncope with acute illness after not eating or drinking. .     Current medicines are reviewed with the patient today.  The patient Has no concerns regarding medicines.  The following changes have been made:  See above Labs/ tests ordered today include:see above  Disposition:   FU:  see above  Signed, Cecilie Kicks, NP  06/04/2017 1:53 PM    [facility] [place], [place], [place], [place] Phone: [phone]; Fax: [phone]

## 2017-06-05 LAB — BASIC METABOLIC PANEL
BUN/Creatinine Ratio: 16 (ref 12–28)
BUN: 15 mg/dL (ref 8–27)
CO2: 25 mmol/L (ref 20–29)
Calcium: 10.2 mg/dL (ref 8.7–10.3)
Chloride: 103 mmol/L (ref 96–106)
Creatinine, Ser: 0.92 mg/dL (ref 0.57–1.00)
GFR calc Af Amer: 67 mL/min/{1.73_m2} (ref >59)
GFR calc non Af Amer: 58 mL/min/{1.73_m2} — ABNORMAL LOW (ref >59)
Glucose: 120 mg/dL — ABNORMAL HIGH (ref 65–99)
Potassium: 4.3 mmol/L (ref 3.5–5.2)
Sodium: 144 mmol/L (ref 134–144)

## 2017-06-05 LAB — HEPATIC FUNCTION PANEL
ALT: 12 IU/L (ref 0–32)
AST: 17 IU/L (ref 0–40)
Albumin: 4.4 g/dL (ref 3.5–4.7)
Alkaline Phosphatase: 69 IU/L (ref 39–117)
Bilirubin Total: 0.4 mg/dL (ref 0.0–1.2)
Bilirubin, Direct: 0.11 mg/dL (ref 0.00–0.40)
Total Protein: 6.8 g/dL (ref 6.0–8.5)

## 2017-06-05 LAB — CBC
Hematocrit: 40.5 % (ref 34.0–46.6)
Hemoglobin: 13.8 g/dL (ref 11.1–15.9)
MCH: 30.7 pg (ref 26.6–33.0)
MCHC: 34.1 g/dL (ref 31.5–35.7)
MCV: 90 fL (ref 79–97)
Platelets: 282 10*3/uL (ref 150–379)
RBC: 4.5 x10E6/uL (ref 3.77–5.28)
RDW: 14 % (ref 12.3–15.4)
WBC: 10.1 10*3/uL (ref 3.4–10.8)

## 2017-06-09 DIAGNOSIS — M545 Low back pain: Secondary | ICD-10-CM | POA: Diagnosis not present

## 2017-06-09 DIAGNOSIS — M6281 Muscle weakness (generalized): Secondary | ICD-10-CM | POA: Diagnosis not present

## 2017-06-11 ENCOUNTER — Encounter (HOSPITAL_COMMUNITY): Payer: Medicare HMO

## 2017-06-11 DIAGNOSIS — M545 Low back pain: Secondary | ICD-10-CM | POA: Diagnosis not present

## 2017-06-11 DIAGNOSIS — M6281 Muscle weakness (generalized): Secondary | ICD-10-CM | POA: Diagnosis not present

## 2017-06-12 ENCOUNTER — Other Ambulatory Visit (HOSPITAL_COMMUNITY): Payer: Medicare HMO

## 2017-06-16 DIAGNOSIS — M545 Low back pain: Secondary | ICD-10-CM | POA: Diagnosis not present

## 2017-06-16 DIAGNOSIS — M6281 Muscle weakness (generalized): Secondary | ICD-10-CM | POA: Diagnosis not present

## 2017-06-19 DIAGNOSIS — M6281 Muscle weakness (generalized): Secondary | ICD-10-CM | POA: Diagnosis not present

## 2017-06-19 DIAGNOSIS — M545 Low back pain: Secondary | ICD-10-CM | POA: Diagnosis not present

## 2017-06-23 DIAGNOSIS — M545 Low back pain: Secondary | ICD-10-CM | POA: Diagnosis not present

## 2017-06-23 DIAGNOSIS — M6281 Muscle weakness (generalized): Secondary | ICD-10-CM | POA: Diagnosis not present

## 2017-06-29 DIAGNOSIS — M6281 Muscle weakness (generalized): Secondary | ICD-10-CM | POA: Diagnosis not present

## 2017-06-29 DIAGNOSIS — M545 Low back pain: Secondary | ICD-10-CM | POA: Diagnosis not present

## 2017-07-02 DIAGNOSIS — M6281 Muscle weakness (generalized): Secondary | ICD-10-CM | POA: Diagnosis not present

## 2017-07-02 DIAGNOSIS — M545 Low back pain: Secondary | ICD-10-CM | POA: Diagnosis not present

## 2017-07-07 DIAGNOSIS — M545 Low back pain: Secondary | ICD-10-CM | POA: Diagnosis not present

## 2017-07-07 DIAGNOSIS — M6281 Muscle weakness (generalized): Secondary | ICD-10-CM | POA: Diagnosis not present

## 2017-07-09 DIAGNOSIS — M545 Low back pain: Secondary | ICD-10-CM | POA: Diagnosis not present

## 2017-07-09 DIAGNOSIS — M6281 Muscle weakness (generalized): Secondary | ICD-10-CM | POA: Diagnosis not present

## 2017-07-15 DIAGNOSIS — I10 Essential (primary) hypertension: Secondary | ICD-10-CM | POA: Diagnosis not present

## 2017-07-15 DIAGNOSIS — H049 Disorder of lacrimal system, unspecified: Secondary | ICD-10-CM | POA: Diagnosis not present

## 2017-07-15 DIAGNOSIS — K599 Functional intestinal disorder, unspecified: Secondary | ICD-10-CM | POA: Diagnosis not present

## 2017-07-15 DIAGNOSIS — N329 Bladder disorder, unspecified: Secondary | ICD-10-CM | POA: Diagnosis not present

## 2017-07-15 DIAGNOSIS — Z0000 Encounter for general adult medical examination without abnormal findings: Secondary | ICD-10-CM | POA: Diagnosis not present

## 2017-07-15 DIAGNOSIS — K219 Gastro-esophageal reflux disease without esophagitis: Secondary | ICD-10-CM | POA: Diagnosis not present

## 2017-07-15 DIAGNOSIS — M4800 Spinal stenosis, site unspecified: Secondary | ICD-10-CM | POA: Diagnosis not present

## 2017-07-15 DIAGNOSIS — E789 Disorder of lipoprotein metabolism, unspecified: Secondary | ICD-10-CM | POA: Diagnosis not present

## 2017-07-15 DIAGNOSIS — Z6824 Body mass index (BMI) 24.0-24.9, adult: Secondary | ICD-10-CM | POA: Diagnosis not present

## 2017-07-15 DIAGNOSIS — R69 Illness, unspecified: Secondary | ICD-10-CM | POA: Diagnosis not present

## 2017-07-16 DIAGNOSIS — M545 Low back pain: Secondary | ICD-10-CM | POA: Diagnosis not present

## 2017-07-16 DIAGNOSIS — M6281 Muscle weakness (generalized): Secondary | ICD-10-CM | POA: Diagnosis not present

## 2017-07-20 DIAGNOSIS — M545 Low back pain: Secondary | ICD-10-CM | POA: Diagnosis not present

## 2017-07-20 DIAGNOSIS — M6281 Muscle weakness (generalized): Secondary | ICD-10-CM | POA: Diagnosis not present

## 2017-07-27 DIAGNOSIS — M6281 Muscle weakness (generalized): Secondary | ICD-10-CM | POA: Diagnosis not present

## 2017-07-27 DIAGNOSIS — M545 Low back pain: Secondary | ICD-10-CM | POA: Diagnosis not present

## 2017-07-30 DIAGNOSIS — M6281 Muscle weakness (generalized): Secondary | ICD-10-CM | POA: Diagnosis not present

## 2017-07-30 DIAGNOSIS — M545 Low back pain: Secondary | ICD-10-CM | POA: Diagnosis not present

## 2017-08-03 DIAGNOSIS — M545 Low back pain: Secondary | ICD-10-CM | POA: Diagnosis not present

## 2017-08-03 DIAGNOSIS — M6281 Muscle weakness (generalized): Secondary | ICD-10-CM | POA: Diagnosis not present

## 2017-08-07 DIAGNOSIS — E0590 Thyrotoxicosis, unspecified without thyrotoxic crisis or storm: Secondary | ICD-10-CM | POA: Diagnosis not present

## 2017-08-07 DIAGNOSIS — R109 Unspecified abdominal pain: Secondary | ICD-10-CM | POA: Diagnosis not present

## 2017-08-11 DIAGNOSIS — M6281 Muscle weakness (generalized): Secondary | ICD-10-CM | POA: Diagnosis not present

## 2017-08-11 DIAGNOSIS — M545 Low back pain: Secondary | ICD-10-CM | POA: Diagnosis not present

## 2017-08-13 DIAGNOSIS — M6281 Muscle weakness (generalized): Secondary | ICD-10-CM | POA: Diagnosis not present

## 2017-08-13 DIAGNOSIS — M545 Low back pain: Secondary | ICD-10-CM | POA: Diagnosis not present

## 2017-08-18 DIAGNOSIS — M6281 Muscle weakness (generalized): Secondary | ICD-10-CM | POA: Diagnosis not present

## 2017-08-18 DIAGNOSIS — M545 Low back pain: Secondary | ICD-10-CM | POA: Diagnosis not present

## 2017-08-20 DIAGNOSIS — M545 Low back pain: Secondary | ICD-10-CM | POA: Diagnosis not present

## 2017-08-20 DIAGNOSIS — M6281 Muscle weakness (generalized): Secondary | ICD-10-CM | POA: Diagnosis not present

## 2017-08-25 DIAGNOSIS — M6281 Muscle weakness (generalized): Secondary | ICD-10-CM | POA: Diagnosis not present

## 2017-08-25 DIAGNOSIS — M545 Low back pain: Secondary | ICD-10-CM | POA: Diagnosis not present

## 2017-08-27 DIAGNOSIS — M6281 Muscle weakness (generalized): Secondary | ICD-10-CM | POA: Diagnosis not present

## 2017-08-27 DIAGNOSIS — M545 Low back pain: Secondary | ICD-10-CM | POA: Diagnosis not present

## 2017-09-01 DIAGNOSIS — M545 Low back pain: Secondary | ICD-10-CM | POA: Diagnosis not present

## 2017-09-01 DIAGNOSIS — M6281 Muscle weakness (generalized): Secondary | ICD-10-CM | POA: Diagnosis not present

## 2017-09-02 NOTE — Progress Notes (Signed)
Cardiology Office Note:    Date:  09/04/2017   ID:  Mallory Stephens, DOB 10-22-1932, MRN [id number]  PCP:  [name], MD  Cardiologist:  No primary care provider on file.    Referring MD: [name], MD   Chief Complaint  Patient presents with  . Atrial Fibrillation  . Hypertension    History of Present Illness:    Mallory Stephens is a 84 y.o. female with a hx of chronic atrial fibrillation and HTN.  She is here today for followup and is doing well.  Since I saw her last she was hospitalized for a UTI with weakness and dehydration from N/V but has not had any further syncope.  She denies any chest pain or pressure, SOB, DOE, PND, orthopnea, LE edema, palpitations. She is compliant with her meds and is tolerating meds with no SE.    Past Medical History:  Diagnosis Date  . Dyslipidemia   . Edema extremities 09/03/2016  . Fatty liver   . GERD (gastroesophageal reflux disease)   . Hypertension   . Lymphocytosis    followed by Dr. Julien Nordmann  . Multinodular goiter   . Osteoarthritis   . Permanent atrial fibrillation (HCC)    atrial fibrillation s/p DCCV 3/10 and 6/10  . PONV (postoperative nausea and vomiting)   . PUD (peptic ulcer disease)   . Pulmonary nodules    followed  by Dr. Stephanie Acre  . UTI (urinary tract infection) 04/2017    Past Surgical History:  Procedure Laterality Date  . aspiration of cyst  1978  . [place]   right breast for benign lesion  . BREAST SURGERY  1971   bilateral lumpectomy for beingn lesions  . CARDIOVERSION  07/22/2011   Procedure: CARDIOVERSION;  Surgeon: [name], MD;  Location: [facility];  Service: Cardiovascular;  Laterality: N/A;  . CARDIOVERSION  09/02/2011   Procedure: CARDIOVERSION;  Surgeon: [name], MD;  Location: [facility];  Service: Cardiovascular;  Laterality: N/A;  . CHOLECYSTECTOMY  2003  . CYSTOSCOPY    . DILATION AND CURETTAGE OF UTERUS  1979  . JOINT REPLACEMENT  2002   Right knee  . JOINT  REPLACEMENT  2007   left knee    Current Medications: Current Meds  Medication Sig  . acetaminophen (TYLENOL) 325 MG tablet Take 325 mg by mouth every 6 (six) hours as needed for moderate pain.  Marland Kitchen amLODipine (NORVASC) 5 MG tablet Take 1 tablet (5 mg total) by mouth daily.  Marland Kitchen apixaban (ELIQUIS) 5 MG TABS tablet Take 1 tablet (5 mg total) by mouth 2 (two) times daily.  . bisacodyl (DULCOLAX) 5 MG EC tablet Take 1 tablet (5 mg total) by mouth daily as needed for moderate constipation.  Marland Kitchen losartan (COZAAR) 100 MG tablet TAKE 1 TABLET BY MOUTH EVERY DAY  . metoprolol tartrate (LOPRESSOR) 25 MG tablet Take 25 mg by mouth 2 (two) times daily.  Marland Kitchen PARoxetine (PAXIL) 10 MG tablet Take 10 mg by mouth every morning.   . polyvinyl alcohol (LIQUIFILM TEARS) 1.4 % ophthalmic solution Place 1-2 drops into both eyes daily as needed for dry eyes.  . pravastatin (PRAVACHOL) 20 MG tablet Take 20 mg by mouth at bedtime.   . ranitidine (ZANTAC) 300 MG tablet Take 300 mg by mouth at bedtime.   . solifenacin (VESICARE) 5 MG tablet Take 5 mg by mouth daily.     Allergies:   Sulfa drugs cross reactors   Social History  Socioeconomic History  . Marital status: Widowed    Spouse name: None  . Number of children: None  . Years of education: None  . Highest education level: None  Social Needs  . Financial resource strain: None  . Food insecurity - worry: None  . Food insecurity - inability: None  . Transportation needs - medical: None  . Transportation needs - non-medical: None  Occupational History  . None  Tobacco Use  . Smoking status: Former Smoker    Last attempt to quit: 07/16/1989    Years since quitting: 28.1  . Smokeless tobacco: Never Used  Substance and Sexual Activity  . Alcohol use: Yes    Comment: occasional wine  . Drug use: No  . Sexual activity: None  Other Topics Concern  . None  Social History Narrative  . None     Family History: The patient's family history includes  Arthritis in her sister; Breast cancer in her sister; Heart disease in her brother; Prostate cancer in her brother and brother; Scoliosis in her sister; Stroke in her father and mother.  ROS:   Please see the history of present illness.    ROS  All other systems reviewed and negative.   EKGs/Labs/Other Studies Reviewed:    The following studies were reviewed today: none  EKG:  EKG is not ordered today.    Recent Labs: 04/30/2017: Magnesium 1.6 06/04/2017: ALT 12; BUN 15; Creatinine, Ser 0.92; Hemoglobin 13.8; Platelets 282; Potassium 4.3; Sodium 144   Recent Lipid Panel    Component Value Date/Time   CHOL 150 02/16/2014 0752   TRIG 112.0 02/16/2014 0752   HDL 45.20 02/16/2014 0752   CHOLHDL 3 02/16/2014 0752   VLDL 22.4 02/16/2014 0752   LDLCALC 82 02/16/2014 0752    Physical Exam:    VS:  BP (!) 110/52   Pulse 87   Ht 5\' 5"  (1.651 m)   Wt 143 lb 12.8 oz (65.2 kg)   SpO2 94%   BMI 23.93 kg/m     Wt Readings from Last 3 Encounters:  09/04/17 143 lb 12.8 oz (65.2 kg)  06/04/17 146 lb 6.4 oz (66.4 kg)  04/28/17 150 lb (68 kg)     GEN:  Well nourished, well developed in no acute distress HEENT: Normal NECK: No JVD; No carotid bruits LYMPHATICS: No lymphadenopathy CARDIAC: RRR, no murmurs, rubs, gallops RESPIRATORY:  Clear to auscultation without rales, wheezing or rhonchi  ABDOMEN: Soft, non-tender, non-distended MUSCULOSKELETAL:  No edema; No deformity  SKIN: Warm and dry NEUROLOGIC:  Alert and oriented x 3 PSYCHIATRIC:  Normal affect   ASSESSMENT:    1. Permanent atrial fibrillation ([place])   2. Essential hypertension, benign   3. Syncope, unspecified syncope type    PLAN:    In order of problems listed above:  1.  Permanent atrial fibrillation - her HR is well controlled on exam today.  She will continue on Lopressor 25mg  BID.  She will continue on Apixaban 5mg  BID for a CHADS2VASC score of 4.    2.  HTN - Her BP is well controlled on exam today. She  will continue on  Losartan 100mg  daily, Lopressor 25mg  BID and amlodipine 5mg  daily.    3.  Syncope secondary to dehydration - there has been no reoccurence.   Medication Adjustments/Labs and Tests Ordered: Current medicines are reviewed at length with the patient today.  Concerns regarding medicines are outlined above.  No orders of the defined types were placed in this  encounter.  No orders of the defined types were placed in this encounter.   Signed, Fransico Him, MD  09/04/2017 10:52 AM    [place]

## 2017-09-04 ENCOUNTER — Ambulatory Visit (INDEPENDENT_AMBULATORY_CARE_PROVIDER_SITE_OTHER): Payer: Medicare HMO | Admitting: Cardiology

## 2017-09-04 ENCOUNTER — Telehealth: Payer: Self-pay

## 2017-09-04 ENCOUNTER — Encounter: Payer: Self-pay | Admitting: Cardiology

## 2017-09-04 VITALS — BP 110/52 | HR 87 | Ht 65.0 in | Wt 143.8 lb

## 2017-09-04 DIAGNOSIS — I482 Chronic atrial fibrillation: Secondary | ICD-10-CM | POA: Diagnosis not present

## 2017-09-04 DIAGNOSIS — I10 Essential (primary) hypertension: Secondary | ICD-10-CM

## 2017-09-04 DIAGNOSIS — I4821 Permanent atrial fibrillation: Secondary | ICD-10-CM

## 2017-09-04 DIAGNOSIS — R55 Syncope and collapse: Secondary | ICD-10-CM

## 2017-09-04 MED ORDER — METOPROLOL TARTRATE 25 MG PO TABS: 25.0000 mg | ORAL_TABLET | Freq: Two times a day (BID) | ORAL | 0 refills | Status: DC

## 2017-09-04 MED ORDER — METOPROLOL TARTRATE 25 MG PO TABS: 25.0000 mg | ORAL_TABLET | Freq: Two times a day (BID) | ORAL | 3 refills | Status: DC

## 2017-09-04 NOTE — Telephone Encounter (Signed)
Left message with Dr. Stephanie Acre nurse for clarification on new prescriptions for metoprolol 50 mg once a day.

## 2017-09-04 NOTE — Patient Instructions (Addendum)
Medication Instructions:  Your physician recommends that you continue on your current medications as directed. Please refer to the Current Medication list given to you today.  If you need a refill on your cardiac medications, please contact your pharmacy first.  Labwork: None ordered   Testing/Procedures: None ordered   Follow-Up: Your physician wants you to follow-up in: 6 months with Dr. Radford Pax. You will receive a reminder letter in the mail two months in advance. If you don't receive a letter, please call our office to schedule the follow-up appointment.  Any Other Special Instructions Will Be Listed Below (If Applicable).     If you need a refill on your cardiac medications before your next appointment, please call your pharmacy.

## 2017-09-08 DIAGNOSIS — M6281 Muscle weakness (generalized): Secondary | ICD-10-CM | POA: Diagnosis not present

## 2017-09-08 DIAGNOSIS — M545 Low back pain: Secondary | ICD-10-CM | POA: Diagnosis not present

## 2017-09-09 NOTE — Telephone Encounter (Signed)
Received a call from Dr. Myer Peer office. She states patient has always been on that dose of metoprolol per chart. She states she will update dose per Dr.Turner's recommendation. She verbalized understanding and thanked me for the call.

## 2017-09-14 ENCOUNTER — Other Ambulatory Visit: Payer: Self-pay | Admitting: Cardiology

## 2017-10-01 ENCOUNTER — Other Ambulatory Visit: Payer: Self-pay | Admitting: Cardiology

## 2017-10-14 DIAGNOSIS — N390 Urinary tract infection, site not specified: Secondary | ICD-10-CM | POA: Diagnosis not present

## 2017-10-14 DIAGNOSIS — R3915 Urgency of urination: Secondary | ICD-10-CM | POA: Diagnosis not present

## 2017-10-30 DIAGNOSIS — H04123 Dry eye syndrome of bilateral lacrimal glands: Secondary | ICD-10-CM | POA: Diagnosis not present

## 2017-12-11 DIAGNOSIS — E042 Nontoxic multinodular goiter: Secondary | ICD-10-CM | POA: Diagnosis not present

## 2017-12-11 DIAGNOSIS — Z8639 Personal history of other endocrine, nutritional and metabolic disease: Secondary | ICD-10-CM | POA: Diagnosis not present

## 2017-12-16 ENCOUNTER — Other Ambulatory Visit: Payer: Self-pay | Admitting: Internal Medicine

## 2017-12-16 DIAGNOSIS — E042 Nontoxic multinodular goiter: Secondary | ICD-10-CM

## 2017-12-16 DIAGNOSIS — Z8639 Personal history of other endocrine, nutritional and metabolic disease: Secondary | ICD-10-CM

## 2017-12-21 ENCOUNTER — Ambulatory Visit
Admission: RE | Admit: 2017-12-21 | Discharge: 2017-12-21 | Disposition: A | Discharge status: Home / Self Care | Payer: Medicare HMO | Source: Ambulatory Visit | Attending: Internal Medicine | Admitting: Internal Medicine

## 2017-12-21 DIAGNOSIS — Z8639 Personal history of other endocrine, nutritional and metabolic disease: Secondary | ICD-10-CM

## 2017-12-21 DIAGNOSIS — E042 Nontoxic multinodular goiter: Secondary | ICD-10-CM

## 2017-12-24 ENCOUNTER — Other Ambulatory Visit: Payer: Self-pay | Admitting: *Deleted

## 2017-12-24 MED ORDER — METOPROLOL TARTRATE 25 MG PO TABS: 25.0000 mg | ORAL_TABLET | Freq: Two times a day (BID) | ORAL | 3 refills | Status: DC

## 2017-12-25 MED ORDER — APIXABAN 5 MG PO TABS: 5.0000 mg | ORAL_TABLET | Freq: Two times a day (BID) | ORAL | 5 refills | Status: DC

## 2018-01-15 ENCOUNTER — Encounter: Payer: Self-pay | Admitting: Cardiology

## 2018-01-28 ENCOUNTER — Ambulatory Visit (INDEPENDENT_AMBULATORY_CARE_PROVIDER_SITE_OTHER): Payer: Medicare HMO | Admitting: Cardiology

## 2018-01-28 ENCOUNTER — Encounter: Payer: Self-pay | Admitting: Cardiology

## 2018-01-28 VITALS — BP 138/74 | HR 81 | Ht 65.0 in | Wt 148.6 lb

## 2018-01-28 DIAGNOSIS — I482 Chronic atrial fibrillation: Secondary | ICD-10-CM | POA: Diagnosis not present

## 2018-01-28 DIAGNOSIS — I4821 Permanent atrial fibrillation: Secondary | ICD-10-CM

## 2018-01-28 DIAGNOSIS — I10 Essential (primary) hypertension: Secondary | ICD-10-CM | POA: Diagnosis not present

## 2018-01-28 DIAGNOSIS — E785 Hyperlipidemia, unspecified: Secondary | ICD-10-CM

## 2018-01-28 LAB — BASIC METABOLIC PANEL
BUN/Creatinine Ratio: 18 (ref 12–28)
BUN: 23 mg/dL (ref 8–27)
CO2: 25 mmol/L (ref 20–29)
Calcium: 10.4 mg/dL — ABNORMAL HIGH (ref 8.7–10.3)
Chloride: 103 mmol/L (ref 96–106)
Creatinine, Ser: 1.27 mg/dL — ABNORMAL HIGH (ref 0.57–1.00)
GFR calc Af Amer: 45 mL/min/{1.73_m2} — ABNORMAL LOW (ref >59)
GFR calc non Af Amer: 39 mL/min/{1.73_m2} — ABNORMAL LOW (ref >59)
Glucose: 98 mg/dL (ref 65–99)
Potassium: 4.1 mmol/L (ref 3.5–5.2)
Sodium: 142 mmol/L (ref 134–144)

## 2018-01-28 NOTE — Patient Instructions (Signed)
Medication Instructions:   Your physician recommends that you continue on your current medications as directed. Please refer to the Current Medication list given to you today.    Labwork:  TODAY--BMET     Follow-Up:  Your physician wants you to follow-up in: [place] will receive a reminder letter in the mail two months in advance. If you don't receive a letter, please call our office to schedule the follow-up appointment.        If you need a refill on your cardiac medications before your next appointment, please call your pharmacy.

## 2018-01-28 NOTE — Progress Notes (Signed)
Cardiology Office Note:    Date:  01/28/2018   ID:  Mallory Stephens, DOB Oct 13, 1932, MRN [id number]  PCP:  [name], MD  Cardiologist:  No primary care provider on file.    Referring MD: [name], MD   Chief Complaint  Patient presents with  . Atrial Fibrillation  . Hypertension    History of Present Illness:    Mallory Stephens is a 84 y.o. female with a hx of chronic atrial fibrillation and HTN.  She is here today for followup and is doing well.  She denies any chest pain or pressure, SOB, DOE, PND, orthopnea, LE edema, dizziness, palpitations or syncope. She is compliant with his meds and is tolerating meds with no SE.    Past Medical History:  Diagnosis Date  . Dyslipidemia   . Edema extremities 09/03/2016  . Fatty liver   . GERD (gastroesophageal reflux disease)   . Hypertension   . Lymphocytosis    followed by Dr. Julien Nordmann  . Multinodular goiter   . Osteoarthritis   . Permanent atrial fibrillation (HCC)    atrial fibrillation s/p DCCV 3/10 and 6/10  . PONV (postoperative nausea and vomiting)   . PUD (peptic ulcer disease)   . Pulmonary nodules    followed  by Dr. Stephanie Acre  . UTI (urinary tract infection) 04/2017    Past Surgical History:  Procedure Laterality Date  . aspiration of cyst  1978  . [place]   right breast for benign lesion  . BREAST SURGERY  1971   bilateral lumpectomy for beingn lesions  . CARDIOVERSION  07/22/2011   Procedure: CARDIOVERSION;  Surgeon: [name], MD;  Location: [place];  Service: Cardiovascular;  Laterality: N/A;  . CARDIOVERSION  09/02/2011   Procedure: CARDIOVERSION;  Surgeon: [name], MD;  Location: [place];  Service: Cardiovascular;  Laterality: N/A;  . CHOLECYSTECTOMY  2003  . CYSTOSCOPY    . DILATION AND CURETTAGE OF UTERUS  1979  . JOINT REPLACEMENT  2002   Right knee  . JOINT REPLACEMENT  2007   left knee    Current Medications: Current Meds  Medication Sig  . acetaminophen  (TYLENOL) 325 MG tablet Take 325 mg by mouth every 6 (six) hours as needed for moderate pain.  Marland Kitchen amLODipine (NORVASC) 5 MG tablet Take 5 mg by mouth daily.  Marland Kitchen apixaban (ELIQUIS) 5 MG TABS tablet Take 1 tablet (5 mg total) by mouth 2 (two) times daily.  . bisacodyl (DULCOLAX) 5 MG EC tablet Take 1 tablet (5 mg total) by mouth daily as needed for moderate constipation.  Marland Kitchen losartan (COZAAR) 100 MG tablet TAKE 1 TABLET BY MOUTH EVERY DAY  . metoprolol tartrate (LOPRESSOR) 25 MG tablet Take 1 tablet (25 mg total) by mouth 2 (two) times daily.  Marland Kitchen PARoxetine (PAXIL) 10 MG tablet Take 10 mg by mouth every morning.   . polyvinyl alcohol (LIQUIFILM TEARS) 1.4 % ophthalmic solution Place 1-2 drops into both eyes daily as needed for dry eyes.  . pravastatin (PRAVACHOL) 20 MG tablet Take 20 mg by mouth at bedtime.   . ranitidine (ZANTAC) 300 MG tablet Take 300 mg by mouth at bedtime.   . solifenacin (VESICARE) 5 MG tablet Take 5 mg by mouth daily.     Allergies:   Sulfa drugs cross reactors   Social History   Socioeconomic History  . Marital status: Widowed    Spouse name: Not on file  . Number of children:  Not on file  . Years of education: Not on file  . Highest education level: Not on file  Occupational History  . Not on file  Social Needs  . Financial resource strain: Not on file  . Food insecurity:    Worry: Not on file    Inability: Not on file  . Transportation needs:    Medical: Not on file    Non-medical: Not on file  Tobacco Use  . Smoking status: Former Smoker    Last attempt to quit: 07/16/1989    Years since quitting: 28.5  . Smokeless tobacco: Never Used  Substance and Sexual Activity  . Alcohol use: Yes    Comment: occasional wine  . Drug use: No  . Sexual activity: Not on file  Lifestyle  . Physical activity:    Days per week: Not on file    Minutes per session: Not on file  . Stress: Not on file  Relationships  . Social connections:    Talks on phone: Not on file     Gets together: Not on file    Attends religious service: Not on file    Active member of club or organization: Not on file    Attends meetings of clubs or organizations: Not on file    Relationship status: Not on file  Other Topics Concern  . Not on file  Social History Narrative  . Not on file     Family History: The patient's family history includes Arthritis in her sister; Breast cancer in her sister; Heart disease in her brother; Prostate cancer in her brother and brother; Scoliosis in her sister; Stroke in her father and mother.  ROS:   Please see the history of present illness.    ROS  All other systems reviewed and negative.   EKGs/Labs/Other Studies Reviewed:    The following studies were reviewed today: none  EKG:  EKG is  ordered today.  The ekg ordered today demonstrates atrial fibrillation with controlled ventricular response at 81 bpm and low voltage QRS  Recent Labs: 04/30/2017: Magnesium 1.6 06/04/2017: ALT 12; BUN 15; Creatinine, Ser 0.92; Hemoglobin 13.8; Platelets 282; Potassium 4.3; Sodium 144   Recent Lipid Panel    Component Value Date/Time   CHOL 150 02/16/2014 0752   TRIG 112.0 02/16/2014 0752   HDL 45.20 02/16/2014 0752   CHOLHDL 3 02/16/2014 0752   VLDL 22.4 02/16/2014 0752   LDLCALC 82 02/16/2014 0752    Physical Exam:    VS:  BP 138/74   Pulse 81   Ht 5\' 5"  (1.651 m)   Wt 148 lb 9.6 oz (67.4 kg)   SpO2 95%   BMI 24.73 kg/m     Wt Readings from Last 3 Encounters:  01/28/18 148 lb 9.6 oz (67.4 kg)  09/04/17 143 lb 12.8 oz (65.2 kg)  06/04/17 146 lb 6.4 oz (66.4 kg)     GEN:  Well nourished, well developed in no acute distress HEENT: Normal NECK: No JVD; No carotid bruits LYMPHATICS: No lymphadenopathy CARDIAC: Irregularly irregular, no murmurs, rubs, gallops RESPIRATORY:  Clear to auscultation without rales, wheezing or rhonchi  ABDOMEN: Soft, non-tender, non-distended MUSCULOSKELETAL:  No edema; No deformity  SKIN: Warm and  dry NEUROLOGIC:  Alert and oriented x 3 PSYCHIATRIC:  Normal affect   ASSESSMENT:    1. Permanent atrial fibrillation ([place])   2. Essential hypertension, benign   3. Dyslipidemia    PLAN:    In order of problems listed above:  1.  Permanent atrial fibrillation -her heart rate is well controlled in atrial fibrillation today.  She will continue on Lopressor 25 mg twice daily.  She is on apixaban 5 mg twice daily for anticoagulation.  Her creatinine was normal at 0.92 on 06/04/2017 and potassium was 4.3.  Hemoglobin was 14.9 on 08/07/2017.  I will repeat a bmet today  2.  Hypertension -BP is well controlled on exam today.  She will continue on Lopressor 25 mg twice daily, Losartan 100mg  daily and amlodipine 5 mg daily.  3.  Hyperlipidemia -her LDL was 78 on 05/25/2017.  This is followed by her PCP.  She will continue on pravastatin 20 mg daily.   Medication Adjustments/Labs and Tests Ordered: Current medicines are reviewed at length with the patient today.  Concerns regarding medicines are outlined above.  No orders of the defined types were placed in this encounter.  No orders of the defined types were placed in this encounter.   Signed, Fransico Him, MD  01/28/2018 9:30 AM    [place]

## 2018-02-01 DIAGNOSIS — R350 Frequency of micturition: Secondary | ICD-10-CM | POA: Diagnosis not present

## 2018-02-02 ENCOUNTER — Telehealth: Payer: Self-pay | Admitting: Cardiology

## 2018-02-02 NOTE — Telephone Encounter (Signed)
New message    Patient has questions regarding lab results

## 2018-02-02 NOTE — Telephone Encounter (Signed)
I returned call to patient. She was calling requesting lab results. I informed pt of her most recent bmet results. She states she was seen by her primary MD regarding her bladder infections and states they have started he on some new meds. I informed her that results have been faxed to her primary MD and they will continue to monitor since they are treating her infection. She stated understanding and thankful for the call.

## 2018-02-23 ENCOUNTER — Other Ambulatory Visit: Payer: Self-pay | Admitting: Cardiology

## 2018-03-11 DIAGNOSIS — H9313 Tinnitus, bilateral: Secondary | ICD-10-CM | POA: Diagnosis not present

## 2018-03-11 DIAGNOSIS — H9113 Presbycusis, bilateral: Secondary | ICD-10-CM | POA: Insufficient documentation

## 2018-03-11 DIAGNOSIS — H903 Sensorineural hearing loss, bilateral: Secondary | ICD-10-CM | POA: Diagnosis not present

## 2018-03-25 DIAGNOSIS — M67911 Unspecified disorder of synovium and tendon, right shoulder: Secondary | ICD-10-CM | POA: Diagnosis not present

## 2018-03-25 DIAGNOSIS — M25521 Pain in right elbow: Secondary | ICD-10-CM | POA: Diagnosis not present

## 2018-03-25 DIAGNOSIS — M25511 Pain in right shoulder: Secondary | ICD-10-CM | POA: Diagnosis not present

## 2018-03-29 DIAGNOSIS — Z23 Encounter for immunization: Secondary | ICD-10-CM | POA: Diagnosis not present

## 2018-03-29 DIAGNOSIS — J069 Acute upper respiratory infection, unspecified: Secondary | ICD-10-CM | POA: Diagnosis not present

## 2018-04-12 DIAGNOSIS — Z1231 Encounter for screening mammogram for malignant neoplasm of breast: Secondary | ICD-10-CM | POA: Diagnosis not present

## 2018-04-12 DIAGNOSIS — Z01419 Encounter for gynecological examination (general) (routine) without abnormal findings: Secondary | ICD-10-CM | POA: Diagnosis not present

## 2018-05-12 DIAGNOSIS — R350 Frequency of micturition: Secondary | ICD-10-CM | POA: Diagnosis not present

## 2018-05-26 DIAGNOSIS — K219 Gastro-esophageal reflux disease without esophagitis: Secondary | ICD-10-CM | POA: Diagnosis not present

## 2018-05-26 DIAGNOSIS — I4891 Unspecified atrial fibrillation: Secondary | ICD-10-CM | POA: Diagnosis not present

## 2018-05-26 DIAGNOSIS — R69 Illness, unspecified: Secondary | ICD-10-CM | POA: Diagnosis not present

## 2018-05-26 DIAGNOSIS — N183 Chronic kidney disease, stage 3 (moderate): Secondary | ICD-10-CM | POA: Diagnosis not present

## 2018-05-26 DIAGNOSIS — E0590 Thyrotoxicosis, unspecified without thyrotoxic crisis or storm: Secondary | ICD-10-CM | POA: Diagnosis not present

## 2018-05-26 DIAGNOSIS — Z79899 Other long term (current) drug therapy: Secondary | ICD-10-CM | POA: Diagnosis not present

## 2018-05-26 DIAGNOSIS — I129 Hypertensive chronic kidney disease with stage 1 through stage 4 chronic kidney disease, or unspecified chronic kidney disease: Secondary | ICD-10-CM | POA: Diagnosis not present

## 2018-05-26 DIAGNOSIS — E785 Hyperlipidemia, unspecified: Secondary | ICD-10-CM | POA: Diagnosis not present

## 2018-05-26 DIAGNOSIS — M899 Disorder of bone, unspecified: Secondary | ICD-10-CM | POA: Diagnosis not present

## 2018-05-26 DIAGNOSIS — Z0000 Encounter for general adult medical examination without abnormal findings: Secondary | ICD-10-CM | POA: Diagnosis not present

## 2018-07-20 ENCOUNTER — Encounter: Payer: Self-pay | Admitting: Physician Assistant

## 2018-07-20 ENCOUNTER — Ambulatory Visit (INDEPENDENT_AMBULATORY_CARE_PROVIDER_SITE_OTHER): Payer: Medicare HMO | Admitting: Physician Assistant

## 2018-07-20 VITALS — BP 128/66 | HR 84 | Ht 65.0 in | Wt 151.1 lb

## 2018-07-20 DIAGNOSIS — I10 Essential (primary) hypertension: Secondary | ICD-10-CM | POA: Diagnosis not present

## 2018-07-20 DIAGNOSIS — E785 Hyperlipidemia, unspecified: Secondary | ICD-10-CM

## 2018-07-20 DIAGNOSIS — I4821 Permanent atrial fibrillation: Secondary | ICD-10-CM | POA: Diagnosis not present

## 2018-07-20 NOTE — Patient Instructions (Signed)
Medication Instructions:  Your physician recommends that you continue on your current medications as directed. Please refer to the Current Medication list given to you today.  If you need a refill on your cardiac medications before your next appointment, please call your pharmacy.   Lab work: TODAY: CBC, BMET  If you have labs (blood work) drawn today and your tests are completely normal, you will receive your results only by: . MyChart Message (if you have MyChart) OR . A paper copy in the mail If you have any lab test that is abnormal or we need to change your treatment, we will call you to review the results.  Testing/Procedures: None ordered  Follow-Up: At [facility], you and your health needs are our priority.  As part of our continuing mission to provide you with exceptional heart care, we have created designated Provider Care Teams.  These Care Teams include your primary Cardiologist (physician) and Advanced Practice Providers (APPs -  Physician Assistants and Nurse Practitioners) who all work together to provide you with the care you need, when you need it. . You will need a follow up appointment in 6 months.  Please call our office 2 months in advance to schedule this appointment.  You may see Traci Turner, MD or one of the following Advanced Practice Providers on your designated Care Team:   . Brittainy Simmons, PA-C . Dayna Dunn, PA-C . Michele Lenze, PA-C  Any Other Special Instructions Will Be Listed Below (If Applicable).    

## 2018-07-20 NOTE — Progress Notes (Signed)
Cardiology Office Note    Date:  07/20/2018   ID:  Mallory Stephens, DOB August 21, 1932, MRN [id number]  PCP:  [name], MD  Cardiologist: Fransico Him, MD EPS: None  Chief Complaint  Patient presents with  . Follow-up    History of Present Illness:  Mallory Stephens is a 84 y.o. female with history of chronic atrial fibrillation on Eliquis, and hypertension and hyperlipidemia.  Last saw Dr. Radford Pax in 01/28/2018 and was doing well.  Patient comes in for 6 month f/u. Denies chest pain, palpitations, dyspnea, dyspnea on exertion, dizziness or presyncope.  Exercises at the [facility] but not going as often as she should.  Overall feels well.    Past Medical History:  Diagnosis Date  . Dyslipidemia   . Edema extremities 09/03/2016  . Fatty liver   . GERD (gastroesophageal reflux disease)   . Hypertension   . Lymphocytosis    followed by Dr. Julien Nordmann  . Multinodular goiter   . Osteoarthritis   . Permanent atrial fibrillation    atrial fibrillation s/p DCCV 3/10 and 6/10  . PONV (postoperative nausea and vomiting)   . PUD (peptic ulcer disease)   . Pulmonary nodules    followed  by Dr. Stephanie Acre  . UTI (urinary tract infection) 04/2017    Past Surgical History:  Procedure Laterality Date  . aspiration of cyst  1978  . [place]   right breast for benign lesion  . BREAST SURGERY  1971   bilateral lumpectomy for beingn lesions  . CARDIOVERSION  07/22/2011   Procedure: CARDIOVERSION;  Surgeon: [name], MD;  Location: [facility];  Service: Cardiovascular;  Laterality: N/A;  . CARDIOVERSION  09/02/2011   Procedure: CARDIOVERSION;  Surgeon: [name], MD;  Location: [place];  Service: Cardiovascular;  Laterality: N/A;  . CHOLECYSTECTOMY  2003  . CYSTOSCOPY    . DILATION AND CURETTAGE OF UTERUS  1979  . JOINT REPLACEMENT  2002   Right knee  . JOINT REPLACEMENT  2007   left knee    Current Medications: Current Meds  Medication Sig  . acetaminophen  (TYLENOL) 325 MG tablet Take 325 mg by mouth every 6 (six) hours as needed for moderate pain.  Marland Kitchen amLODipine (NORVASC) 5 MG tablet TAKE 1 TABLET DAILY  . apixaban (ELIQUIS) 5 MG TABS tablet Take 1 tablet (5 mg total) by mouth 2 (two) times daily.  . bisacodyl (DULCOLAX) 5 MG EC tablet Take 1 tablet (5 mg total) by mouth daily as needed for moderate constipation.  . famotidine (PEPCID) 20 MG tablet Take 20 mg by mouth 2 (two) times daily.  Marland Kitchen losartan (COZAAR) 100 MG tablet TAKE 1 TABLET BY MOUTH EVERY DAY  . metoprolol tartrate (LOPRESSOR) 25 MG tablet Take 1 tablet (25 mg total) by mouth 2 (two) times daily.  Marland Kitchen PARoxetine (PAXIL) 10 MG tablet Take 10 mg by mouth every morning.   . polyvinyl alcohol (LIQUIFILM TEARS) 1.4 % ophthalmic solution Place 1-2 drops into both eyes daily as needed for dry eyes.  . pravastatin (PRAVACHOL) 20 MG tablet Take 20 mg by mouth at bedtime.   . solifenacin (VESICARE) 5 MG tablet Take 5 mg by mouth daily.     Allergies:   Sulfa drugs cross reactors   Social History   Socioeconomic History  . Marital status: Widowed    Spouse name: Not on file  . Number of children: Not on file  . Years of education: Not on  file  . Highest education level: Not on file  Occupational History  . Not on file  Social Needs  . Financial resource strain: Not on file  . Food insecurity:    Worry: Not on file    Inability: Not on file  . Transportation needs:    Medical: Not on file    Non-medical: Not on file  Tobacco Use  . Smoking status: Former Smoker    Last attempt to quit: 07/16/1989    Years since quitting: 29.0  . Smokeless tobacco: Never Used  Substance and Sexual Activity  . Alcohol use: Yes    Comment: occasional wine  . Drug use: No  . Sexual activity: Not on file  Lifestyle  . Physical activity:    Days per week: Not on file    Minutes per session: Not on file  . Stress: Not on file  Relationships  . Social connections:    Talks on phone: Not on file     Gets together: Not on file    Attends religious service: Not on file    Active member of club or organization: Not on file    Attends meetings of clubs or organizations: Not on file    Relationship status: Not on file  Other Topics Concern  . Not on file  Social History Narrative  . Not on file     Family History:  The patient's   family history includes Arthritis in her sister; Breast cancer in her sister; Heart disease in her brother; Prostate cancer in her brother and brother; Scoliosis in her sister; Stroke in her father and mother.   ROS:   Please see the history of present illness.    Review of Systems  Constitution: Negative.  HENT: Negative.   Eyes: Negative.   Cardiovascular: Negative.   Respiratory: Negative.   Hematologic/Lymphatic: Negative.   Musculoskeletal: Negative.  Negative for joint pain.  Gastrointestinal: Negative.   Genitourinary: Negative.   Neurological: Negative.    All other systems reviewed and are negative.   PHYSICAL EXAM:   VS:  BP 128/66   Pulse 84   Ht 5\' 5"  (1.651 m)   Wt 151 lb 1.9 oz (68.5 kg)   SpO2 91%   BMI 25.15 kg/m   Physical Exam  GEN: Well nourished, well developed, in no acute distress  Neck: no JVD, carotid bruits, or masses Cardiac: Irregular irregular; no murmurs, rubs, or gallops  Respiratory:  clear to auscultation bilaterally, normal work of breathing GI: soft, nontender, nondistended, + BS Ext: without cyanosis, clubbing, or edema, Good distal pulses bilaterally Neuro:  Alert and Oriented x 3 Psych: euthymic mood, full affect  Wt Readings from Last 3 Encounters:  07/20/18 151 lb 1.9 oz (68.5 kg)  01/28/18 148 lb 9.6 oz (67.4 kg)  09/04/17 143 lb 12.8 oz (65.2 kg)      Studies/Labs Reviewed:   EKG:  EKG is not ordered today.   Recent Labs: 01/28/2018: BUN 23; Creatinine, Ser 1.27; Potassium 4.1; Sodium 142   Lipid Panel    Component Value Date/Time   CHOL 150 02/16/2014 0752   TRIG 112.0 02/16/2014  0752   HDL 45.20 02/16/2014 0752   CHOLHDL 3 02/16/2014 0752   VLDL 22.4 02/16/2014 0752   LDLCALC 82 02/16/2014 0752    Additional studies/ records that were reviewed today include:   2D echo 04/29/2017 Study Conclusions   - Left ventricle: The cavity size was normal. Systolic function was   normal.  The estimated ejection fraction was in the range of 60%   to 65%. Wall motion was normal; there were no regional wall   motion abnormalities. The study is not technically sufficient to   allow evaluation of LV diastolic function. - Aortic valve: There was mild regurgitation. - Mitral valve: Calcified annulus. Mildly thickened leaflets . - Left atrium: The atrium was moderately dilated. - Right atrium: The atrium was moderately dilated. - Atrial septum: No defect or patent foramen ovale was identified. - Pulmonary arteries: PA peak pressure: 33 mm Hg (S).   ASSESSMENT:    1. Permanent atrial fibrillation   2. Essential hypertension, benign   3. Dyslipidemia      PLAN:  In order of problems listed above:  Permanent atrial fibrillation on Eliquis and metoprolol doing well.  We will check surveillance labs.  Doing well without complaints.  Follow-up with Dr. Radford Pax in 6 months.  Essential hypertension well-controlled  Dyslipidemia on Pravachol lipids stable in October.    Medication Adjustments/Labs and Tests Ordered: Current medicines are reviewed at length with the patient today.  Concerns regarding medicines are outlined above.  Medication changes, Labs and Tests ordered today are listed in the Patient Instructions below. Patient Instructions  Medication Instructions:  Your physician recommends that you continue on your current medications as directed. Please refer to the Current Medication list given to you today.  If you need a refill on your cardiac medications before your next appointment, please call your pharmacy.   Lab work: TODAY: CBC, BMET  If you have labs  (blood work) drawn today and your tests are completely normal, you will receive your results only by: Marland Kitchen MyChart Message (if you have MyChart) OR . A paper copy in the mail If you have any lab test that is abnormal or we need to change your treatment, we will call you to review the results.  Testing/Procedures: None ordered  Follow-Up: At [facility], you and your health needs are our priority.  As part of our continuing mission to provide you with exceptional heart care, we have created designated Provider Care Teams.  These Care Teams include your primary Cardiologist (physician) and Advanced Practice Providers (APPs -  Physician Assistants and Nurse Practitioners) who all work together to provide you with the care you need, when you need it. . You will need a follow up appointment in 6 months.  Please call our office 2 months in advance to schedule this appointment.  You may see Fransico Him, MD or one of the following Advanced Practice Providers on your designated Care Team:   . Lyda Jester, PA-C . Dayna Dunn, PA-C . Ermalinda Barrios, PA-C  Any Other Special Instructions Will Be Listed Below (If Applicable).       Signed, Ermalinda Barrios, PA-C  07/20/2018 1:21 PM    [facility] [place], [place], [place]  [address] Phone: [phone]; Fax: [phone]

## 2018-07-21 LAB — CBC
Hematocrit: 40.9 % (ref 34.0–46.6)
Hemoglobin: 14 g/dL (ref 11.1–15.9)
MCH: 30.5 pg (ref 26.6–33.0)
MCHC: 34.2 g/dL (ref 31.5–35.7)
MCV: 89 fL (ref 79–97)
Platelets: 260 10*3/uL (ref 150–450)
RBC: 4.59 x10E6/uL (ref 3.77–5.28)
RDW: 12.2 % — ABNORMAL LOW (ref 12.3–15.4)
WBC: 11.4 10*3/uL — ABNORMAL HIGH (ref 3.4–10.8)

## 2018-07-21 LAB — BASIC METABOLIC PANEL
BUN/Creatinine Ratio: 17 (ref 12–28)
BUN: 21 mg/dL (ref 8–27)
CO2: 25 mmol/L (ref 20–29)
Calcium: 10.1 mg/dL (ref 8.7–10.3)
Chloride: 103 mmol/L (ref 96–106)
Creatinine, Ser: 1.26 mg/dL — ABNORMAL HIGH (ref 0.57–1.00)
GFR calc Af Amer: 46 mL/min/{1.73_m2} — ABNORMAL LOW (ref >59)
GFR calc non Af Amer: 39 mL/min/{1.73_m2} — ABNORMAL LOW (ref >59)
Glucose: 103 mg/dL — ABNORMAL HIGH (ref 65–99)
Potassium: 4.3 mmol/L (ref 3.5–5.2)
Sodium: 142 mmol/L (ref 134–144)

## 2018-08-04 ENCOUNTER — Other Ambulatory Visit: Payer: Self-pay | Admitting: Cardiology

## 2018-09-20 DIAGNOSIS — K591 Functional diarrhea: Secondary | ICD-10-CM | POA: Diagnosis not present

## 2018-09-20 DIAGNOSIS — R159 Full incontinence of feces: Secondary | ICD-10-CM | POA: Diagnosis not present

## 2018-12-09 ENCOUNTER — Other Ambulatory Visit: Payer: Self-pay | Admitting: Cardiology

## 2019-01-10 ENCOUNTER — Other Ambulatory Visit: Payer: Self-pay | Admitting: Cardiology

## 2019-01-10 NOTE — Telephone Encounter (Signed)
Eliquis 5mg  refill request received; pt is 85 yrs old, wt-68.5kg, Crea-1.26 on 07/20/2018, last seen by Estella Husk 07/20/2018; will send in refill to requested pharmacy.

## 2019-02-28 ENCOUNTER — Other Ambulatory Visit: Payer: Self-pay | Admitting: Cardiology

## 2019-03-01 DIAGNOSIS — H5213 Myopia, bilateral: Secondary | ICD-10-CM | POA: Diagnosis not present

## 2019-03-01 DIAGNOSIS — H52203 Unspecified astigmatism, bilateral: Secondary | ICD-10-CM | POA: Diagnosis not present

## 2019-03-01 DIAGNOSIS — Z961 Presence of intraocular lens: Secondary | ICD-10-CM | POA: Diagnosis not present

## 2019-03-01 DIAGNOSIS — H43813 Vitreous degeneration, bilateral: Secondary | ICD-10-CM | POA: Diagnosis not present

## 2019-03-15 ENCOUNTER — Encounter: Payer: Self-pay | Admitting: Cardiology

## 2019-03-15 ENCOUNTER — Ambulatory Visit (INDEPENDENT_AMBULATORY_CARE_PROVIDER_SITE_OTHER): Payer: Medicare HMO | Admitting: Cardiology

## 2019-03-15 ENCOUNTER — Other Ambulatory Visit: Payer: Self-pay

## 2019-03-15 VITALS — BP 110/68 | HR 79 | Ht 65.0 in | Wt 151.8 lb

## 2019-03-15 DIAGNOSIS — E785 Hyperlipidemia, unspecified: Secondary | ICD-10-CM

## 2019-03-15 DIAGNOSIS — I4821 Permanent atrial fibrillation: Secondary | ICD-10-CM | POA: Diagnosis not present

## 2019-03-15 DIAGNOSIS — I10 Essential (primary) hypertension: Secondary | ICD-10-CM

## 2019-03-15 NOTE — Patient Instructions (Addendum)
Medication Instructions:  Your physician recommends that you continue on your current medications as directed. Please refer to the Current Medication list given to you today.  If you need a refill on your cardiac medications before your next appointment, please call your pharmacy.   Lab work: October 12:  COME FASTING FOR LIPID & LFT  If you have labs (blood work) drawn today and your tests are completely normal, you will receive your results only by: Marland Kitchen MyChart Message (if you have MyChart) OR . A paper copy in the mail If you have any lab test that is abnormal or we need to change your treatment, we will call you to review the results.  Testing/Procedures: None ordered  Follow-Up: At [facility], you and your health needs are our priority.  As part of our continuing mission to provide you with exceptional heart care, we have created designated Provider Care Teams.  These Care Teams include your primary Cardiologist (physician) and Advanced Practice Providers (APPs -  Physician Assistants and Nurse Practitioners) who all work together to provide you with the care you need, when you need it. You will need a follow up appointment in 6 months.  Please call our office 2 months in advance to schedule this appointment.  You may see Fransico Him, MD or one of the following Advanced Practice Providers on your designated Care Team:   [place], PA-C Melina Copa, PA-C . Ermalinda Barrios, PA-C  Any Other Special Instructions Will Be Listed Below (If Applicable).

## 2019-03-15 NOTE — Progress Notes (Signed)
Cardiology Office Note   Date:  03/16/2019   ID:  Mallory Stephens, DOB 1933-03-06, MRN [id number]  PCP:  [name], MD  Cardiologist:  Dr. Radford Pax    Chief Complaint  Patient presents with  . Atrial Fibrillation    chronic       History of Present Illness: Mallory Stephens is a 85 y.o. female who presents for atrial fib.   Last seen 01/28/18 with a hx of chronic atrial fibrillation, HLD and HTN. She is here today for followup and is doing well.  She denies any chest pain or pressure, SOB, DOE, PND, orthopnea, LE edema, dizziness, palpitations or syncope.   Today she has no complaints, no chest pain or SOB.  She has vertigo and takes meclizine at times for this.  No bleeding with eliquis.  No awareness of rapid HR.  She works in yard for exercise.  Her family mentions memory issues.    Past Medical History:  Diagnosis Date  . Dyslipidemia   . Edema extremities 09/03/2016  . Fatty liver   . GERD (gastroesophageal reflux disease)   . Hypertension   . Lymphocytosis    followed by Dr. Julien Nordmann  . Multinodular goiter   . Osteoarthritis   . Permanent atrial fibrillation    atrial fibrillation s/p DCCV 3/10 and 6/10  . PONV (postoperative nausea and vomiting)   . PUD (peptic ulcer disease)   . Pulmonary nodules    followed  by Dr. Stephanie Acre  . UTI (urinary tract infection) 04/2017    Past Surgical History:  Procedure Laterality Date  . aspiration of cyst  1978  . [place]   right breast for benign lesion  . BREAST SURGERY  1971   bilateral lumpectomy for beingn lesions  . CARDIOVERSION  07/22/2011   Procedure: CARDIOVERSION;  Surgeon: [name], MD;  Location: [place];  Service: Cardiovascular;  Laterality: N/A;  . CARDIOVERSION  09/02/2011   Procedure: CARDIOVERSION;  Surgeon: [name], MD;  Location: [place];  Service: Cardiovascular;  Laterality: N/A;  . CHOLECYSTECTOMY  2003  . CYSTOSCOPY    . DILATION AND CURETTAGE OF UTERUS  1979  . JOINT  REPLACEMENT  2002   Right knee  . JOINT REPLACEMENT  2007   left knee     Current Outpatient Medications  Medication Sig Dispense Refill  . acetaminophen (TYLENOL) 325 MG tablet Take 325 mg by mouth every 6 (six) hours as needed for moderate pain.    Marland Kitchen amLODipine (NORVASC) 5 MG tablet Take 1 tablet (5 mg total) by mouth daily. 90 tablet 2  . bisacodyl (DULCOLAX) 5 MG EC tablet Take 1 tablet (5 mg total) by mouth daily as needed for moderate constipation. 30 tablet 0  . ELIQUIS 5 MG TABS tablet TAKE 1 TABLET TWICE A DAY 180 tablet 1  . famotidine (PEPCID) 40 MG tablet Take 40 mg by mouth daily.    Marland Kitchen losartan (COZAAR) 100 MG tablet TAKE 1 TABLET BY MOUTH EVERY DAY 90 tablet 1  . metoprolol tartrate (LOPRESSOR) 25 MG tablet TAKE 1 TABLET TWICE A DAY 180 tablet 1  . PARoxetine (PAXIL) 10 MG tablet Take 10 mg by mouth every morning.     . polyvinyl alcohol (LIQUIFILM TEARS) 1.4 % ophthalmic solution Place 1-2 drops into both eyes daily as needed for dry eyes.    . pravastatin (PRAVACHOL) 20 MG tablet Take 20 mg by mouth at bedtime.     Marland Kitchen  solifenacin (VESICARE) 5 MG tablet Take 5 mg by mouth daily.     No current facility-administered medications for this visit.     Allergies:   Sulfa drugs cross reactors    Social History:  The patient  reports that she quit smoking about 29 years ago. She has never used smokeless tobacco. She reports current alcohol use. She reports that she does not use drugs.   Family History:  The patient's family history includes Arthritis in her sister; Breast cancer in her sister; Heart disease in her brother; Prostate cancer in her brother and brother; Scoliosis in her sister; Stroke in her father and mother.    ROS:  General:no colds or fevers, no weight changes Skin:no rashes or ulcers HEENT:no blurred vision, no congestion CV:see HPI PUL:see HPI GI:no diarrhea constipation or melena, no indigestion GU:no hematuria, no dysuria MS:no joint pain, no  claudication Neuro:no syncope, no lightheadedness Endo:no diabetes, no thyroid disease  Wt Readings from Last 3 Encounters:  03/15/19 151 lb 12.8 oz (68.9 kg)  07/20/18 151 lb 1.9 oz (68.5 kg)  01/28/18 148 lb 9.6 oz (67.4 kg)     PHYSICAL EXAM: VS:  BP 110/68   Pulse 79   Ht 5\' 5"  (1.651 m)   Wt 151 lb 12.8 oz (68.9 kg)   SpO2 96%   BMI 25.26 kg/m  , BMI Body mass index is 25.26 kg/m. General:Pleasant affect, NAD Skin:Warm and dry, brisk capillary refill HEENT:normocephalic, sclera clear, mucus membranes moist Neck:supple, no JVD, no bruits  Heart:irreg irreg  without murmur, gallup, rub or click Lungs:clear without rales, rhonchi, or wheezes XNT:ZGYF, non tender, + BS, do not palpate liver spleen or masses Ext:no lower ext edema, 2+ pedal pulses, 2+ radial pulses Neuro:alert and oriented X 3, MAE, follows commands, + facial symmetry    EKG:  EKG is ordered today. The ekg ordered today demonstrates atrial fib at 79 no ST changes,    Recent Labs: 07/20/2018: BUN 21; Creatinine, Ser 1.26; Hemoglobin 14.0; Platelets 260; Potassium 4.3; Sodium 142    Lipid Panel    Component Value Date/Time   CHOL 150 02/16/2014 0752   TRIG 112.0 02/16/2014 0752   HDL 45.20 02/16/2014 0752   CHOLHDL 3 02/16/2014 0752   VLDL 22.4 02/16/2014 0752   LDLCALC 82 02/16/2014 0752       Other studies Reviewed: Additional studies/ records that were reviewed today include: . Echo 04/29/17  Study Conclusions  - Left ventricle: The cavity size was normal. Systolic function was   normal. The estimated ejection fraction was in the range of 60%   to 65%. Wall motion was normal; there were no regional wall   motion abnormalities. The study is not technically sufficient to   allow evaluation of LV diastolic function. - Aortic valve: There was mild regurgitation. - Mitral valve: Calcified annulus. Mildly thickened leaflets . - Left atrium: The atrium was moderately dilated. - Right atrium:  The atrium was moderately dilated. - Atrial septum: No defect or patent foramen ovale was identified. - Pulmonary arteries: PA peak pressure: 33 mm Hg (S).   ASSESSMENT AND PLAN:  1.  Permanent a fib, rate controlled on EKG today.  No complaints of rapid HR.  Continues on anticoagulation with Eliquis with no bleeding.  Follow up with Dr. Radford Pax in 6 months.  2.  HTN well controlled no change in meds  3.  HLD will need lipids and hepatic in Oct continue Pravachol.     Current medicines  are reviewed with the patient today.  The patient Has no concerns regarding medicines.  The following changes have been made:  See above Labs/ tests ordered today include:see above  Disposition:   FU:  see above  Signed, Cecilie Kicks, NP  03/16/2019 3:20 PM    [facility] [place], [place], [place], [place] Phone: [phone]; Fax: [phone]

## 2019-04-13 DIAGNOSIS — Z23 Encounter for immunization: Secondary | ICD-10-CM | POA: Diagnosis not present

## 2019-04-19 DIAGNOSIS — L738 Other specified follicular disorders: Secondary | ICD-10-CM | POA: Diagnosis not present

## 2019-04-19 DIAGNOSIS — L720 Epidermal cyst: Secondary | ICD-10-CM | POA: Diagnosis not present

## 2019-04-19 DIAGNOSIS — D1801 Hemangioma of skin and subcutaneous tissue: Secondary | ICD-10-CM | POA: Diagnosis not present

## 2019-04-19 DIAGNOSIS — L821 Other seborrheic keratosis: Secondary | ICD-10-CM | POA: Diagnosis not present

## 2019-04-19 DIAGNOSIS — D692 Other nonthrombocytopenic purpura: Secondary | ICD-10-CM | POA: Diagnosis not present

## 2019-04-19 DIAGNOSIS — L570 Actinic keratosis: Secondary | ICD-10-CM | POA: Diagnosis not present

## 2019-04-29 DIAGNOSIS — Z1231 Encounter for screening mammogram for malignant neoplasm of breast: Secondary | ICD-10-CM | POA: Diagnosis not present

## 2019-04-29 DIAGNOSIS — Z01419 Encounter for gynecological examination (general) (routine) without abnormal findings: Secondary | ICD-10-CM | POA: Diagnosis not present

## 2019-04-29 DIAGNOSIS — Z0000 Encounter for general adult medical examination without abnormal findings: Secondary | ICD-10-CM | POA: Diagnosis not present

## 2019-05-16 ENCOUNTER — Other Ambulatory Visit: Payer: Medicare HMO

## 2019-05-31 ENCOUNTER — Other Ambulatory Visit: Payer: Medicare HMO

## 2019-06-01 ENCOUNTER — Other Ambulatory Visit: Payer: Self-pay

## 2019-06-01 ENCOUNTER — Other Ambulatory Visit: Payer: Medicare HMO | Admitting: *Deleted

## 2019-06-01 DIAGNOSIS — E785 Hyperlipidemia, unspecified: Secondary | ICD-10-CM | POA: Diagnosis not present

## 2019-06-01 LAB — LIPID PANEL
Chol/HDL Ratio: 3 ratio (ref 0.0–4.4)
Cholesterol, Total: 133 mg/dL (ref 100–199)
HDL: 45 mg/dL (ref >39)
LDL Chol Calc (NIH): 72 mg/dL (ref 0–99)
Triglycerides: 85 mg/dL (ref 0–149)
VLDL Cholesterol Cal: 16 mg/dL (ref 5–40)

## 2019-06-01 LAB — HEPATIC FUNCTION PANEL
ALT: 18 IU/L (ref 0–32)
AST: 17 IU/L (ref 0–40)
Albumin: 4.2 g/dL (ref 3.6–4.6)
Alkaline Phosphatase: 47 IU/L (ref 39–117)
Bilirubin Total: 0.7 mg/dL (ref 0.0–1.2)
Bilirubin, Direct: 0.19 mg/dL (ref 0.00–0.40)
Total Protein: 6.5 g/dL (ref 6.0–8.5)

## 2019-06-03 ENCOUNTER — Telehealth: Payer: Self-pay | Admitting: *Deleted

## 2019-06-03 NOTE — Telephone Encounter (Signed)
Pt returned the call to the office and she has been made aware of her lab results and she verbalized understanding.

## 2019-07-05 ENCOUNTER — Other Ambulatory Visit: Payer: Self-pay | Admitting: Cardiology

## 2019-07-06 NOTE — Telephone Encounter (Signed)
Pt last saw Cecilie Kicks, NP 03/15/19, last labs 07/20/18 Creat 1.26, age 85, weight 68.9kg, based on specified criteria pt is on appropriate dosage of Eliquis 5mg  BId.  Will refill rx.

## 2019-08-02 DIAGNOSIS — R7301 Impaired fasting glucose: Secondary | ICD-10-CM | POA: Diagnosis not present

## 2019-08-02 DIAGNOSIS — E0590 Thyrotoxicosis, unspecified without thyrotoxic crisis or storm: Secondary | ICD-10-CM | POA: Diagnosis not present

## 2019-08-02 DIAGNOSIS — Z79899 Other long term (current) drug therapy: Secondary | ICD-10-CM | POA: Diagnosis not present

## 2019-08-02 DIAGNOSIS — I10 Essential (primary) hypertension: Secondary | ICD-10-CM | POA: Diagnosis not present

## 2019-08-02 DIAGNOSIS — Z0000 Encounter for general adult medical examination without abnormal findings: Secondary | ICD-10-CM | POA: Diagnosis not present

## 2019-08-02 DIAGNOSIS — N1832 Chronic kidney disease, stage 3b: Secondary | ICD-10-CM | POA: Diagnosis not present

## 2019-08-02 DIAGNOSIS — E559 Vitamin D deficiency, unspecified: Secondary | ICD-10-CM | POA: Diagnosis not present

## 2019-08-02 DIAGNOSIS — E785 Hyperlipidemia, unspecified: Secondary | ICD-10-CM | POA: Diagnosis not present

## 2019-08-02 DIAGNOSIS — K7581 Nonalcoholic steatohepatitis (NASH): Secondary | ICD-10-CM | POA: Diagnosis not present

## 2019-08-02 DIAGNOSIS — I4891 Unspecified atrial fibrillation: Secondary | ICD-10-CM | POA: Diagnosis not present

## 2019-08-25 NOTE — Progress Notes (Signed)
Cardiology Office Note:    Date:  08/26/2019   ID:  Mallory Stephens, DOB November 08, 1932, MRN [id number]  PCP:  [name], MD  Cardiologist:  Fransico Him, MD    Referring MD: [name], MD   Chief Complaint  Patient presents with  . Atrial Fibrillation  . Hypertension    History of Present Illness:    Mallory Stephens is a 86 y.o. female with a hx of chronic atrial fibrillation and HTN. She is here today for followup and is doing well.  She denies any chest pain or pressure, SOB, DOE, PND, orthopnea,  dizziness, palpitations or syncope. She occasionally has some LE edema but it is gone by the am.  She has had a few nose bleeds on the anticoagulant but is not using her nasal saline spray daily. She is compliant with her meds and is tolerating meds with no SE.    Past Medical History:  Diagnosis Date  . Dyslipidemia   . Edema extremities 09/03/2016  . Fatty liver   . GERD (gastroesophageal reflux disease)   . Hypertension   . Lymphocytosis    followed by Dr. Julien Nordmann  . Multinodular goiter   . Osteoarthritis   . Permanent atrial fibrillation (HCC)    atrial fibrillation s/p DCCV 3/10 and 6/10  . PONV (postoperative nausea and vomiting)   . PUD (peptic ulcer disease)   . Pulmonary nodules    followed  by Dr. Stephanie Acre  . UTI (urinary tract infection) 04/2017    Past Surgical History:  Procedure Laterality Date  . aspiration of cyst  1978  . [place]   right breast for benign lesion  . BREAST SURGERY  1971   bilateral lumpectomy for beingn lesions  . CARDIOVERSION  07/22/2011   Procedure: CARDIOVERSION;  Surgeon: [name], MD;  Location: [place];  Service: Cardiovascular;  Laterality: N/A;  . CARDIOVERSION  09/02/2011   Procedure: CARDIOVERSION;  Surgeon: [name], MD;  Location: [place];  Service: Cardiovascular;  Laterality: N/A;  . CHOLECYSTECTOMY  2003  . CYSTOSCOPY    . DILATION AND CURETTAGE OF UTERUS  1979  . JOINT REPLACEMENT  2002    Right knee  . JOINT REPLACEMENT  2007   left knee    Current Medications: Current Meds  Medication Sig  . acetaminophen (TYLENOL) 325 MG tablet Take 325 mg by mouth every 6 (six) hours as needed for moderate pain.  Marland Kitchen amLODipine (NORVASC) 5 MG tablet Take 1 tablet (5 mg total) by mouth daily.  . bisacodyl (DULCOLAX) 5 MG EC tablet Take 1 tablet (5 mg total) by mouth daily as needed for moderate constipation.  Marland Kitchen ELIQUIS 5 MG TABS tablet TAKE 1 TABLET TWICE A DAY  . famotidine (PEPCID) 40 MG tablet Take 40 mg by mouth daily.  Marland Kitchen losartan (COZAAR) 100 MG tablet TAKE 1 TABLET BY MOUTH EVERY DAY  . metoprolol tartrate (LOPRESSOR) 25 MG tablet TAKE 1 TABLET TWICE A DAY  . PARoxetine (PAXIL) 10 MG tablet Take 10 mg by mouth every morning.   . polyvinyl alcohol (LIQUIFILM TEARS) 1.4 % ophthalmic solution Place 1-2 drops into both eyes daily as needed for dry eyes.  . pravastatin (PRAVACHOL) 20 MG tablet Take 20 mg by mouth at bedtime.   . solifenacin (VESICARE) 5 MG tablet Take 5 mg by mouth daily.     Allergies:   Sulfa drugs cross reactors   Social History   Socioeconomic History  .  Marital status: Widowed    Spouse name: Not on file  . Number of children: Not on file  . Years of education: Not on file  . Highest education level: Not on file  Occupational History  . Not on file  Tobacco Use  . Smoking status: Former Smoker    Quit date: 07/16/1989    Years since quitting: 30.1  . Smokeless tobacco: Never Used  Substance and Sexual Activity  . Alcohol use: Yes    Comment: occasional wine  . Drug use: No  . Sexual activity: Not on file  Other Topics Concern  . Not on file  Social History Narrative  . Not on file   Social Determinants of Health   Financial Resource Strain:   . Difficulty of Paying Living Expenses: Not on file  Food Insecurity:   . Worried About Charity fundraiser in the Last Year: Not on file  . Ran Out of Food in the Last Year: Not on file   Transportation Needs:   . Lack of Transportation (Medical): Not on file  . Lack of Transportation (Non-Medical): Not on file  Physical Activity:   . Days of Exercise per Week: Not on file  . Minutes of Exercise per Session: Not on file  Stress:   . Feeling of Stress : Not on file  Social Connections:   . Frequency of Communication with Friends and Family: Not on file  . Frequency of Social Gatherings with Friends and Family: Not on file  . Attends Religious Services: Not on file  . Active Member of Clubs or Organizations: Not on file  . Attends Archivist Meetings: Not on file  . Marital Status: Not on file     Family History: The patient's family history includes Arthritis in her sister; Breast cancer in her sister; Heart disease in her brother; Prostate cancer in her brother and brother; Scoliosis in her sister; Stroke in her father and mother.  ROS:   Please see the history of present illness.    ROS  All other systems reviewed and negative.   EKGs/Labs/Other Studies Reviewed:    The following studies were reviewed today:  labs  EKG:  EKG is not  ordered today.    Recent Labs: 06/01/2019: ALT 18   Recent Lipid Panel    Component Value Date/Time   CHOL 133 06/01/2019 0958   TRIG 85 06/01/2019 0958   HDL 45 06/01/2019 0958   CHOLHDL 3.0 06/01/2019 0958   CHOLHDL 3 02/16/2014 0752   VLDL 22.4 02/16/2014 0752   LDLCALC 72 06/01/2019 0958    Physical Exam:    VS:  BP 112/68   Pulse 92   Ht 5\' 5"  (1.651 m)   Wt 152 lb 12.8 oz (69.3 kg)   SpO2 95%   BMI 25.43 kg/m     Wt Readings from Last 3 Encounters:  08/26/19 152 lb 12.8 oz (69.3 kg)  03/15/19 151 lb 12.8 oz (68.9 kg)  07/20/18 151 lb 1.9 oz (68.5 kg)     GEN:  Well nourished, well developed in no acute distress HEENT: Normal NECK: No JVD; No carotid bruits LYMPHATICS: No lymphadenopathy CARDIAC: irregularly irregular, no murmurs, rubs, gallops RESPIRATORY:  Clear to auscultation without  rales, wheezing or rhonchi  ABDOMEN: Soft, non-tender, non-distended MUSCULOSKELETAL:  No edema; No deformity  SKIN: Warm and dry NEUROLOGIC:  Alert and oriented x 3 PSYCHIATRIC:  Normal affect   ASSESSMENT:    1. Permanent atrial fibrillation (  [place])   2. Essential hypertension, benign   3. Dyslipidemia    PLAN:    In order of problems listed above:  1.  Permanent atrial fibrillation -HR well controlled -no bleeding problems on anticoagulation -continue Eliquis 5mg  BID and Lopressor 25mg  BID -Creatinine 1.2 on 07/2019 -I will get a copy of CBC from PCP -encouraged her to use her nasal saline spray 2 sprays each nostril BID -she will call if she continues to get nose bleeds and may have to refer to ENT -she has not had any significant falls but will let me know if that occurs  2.  HTN -BP controlled -continue Lopressor 25mg  BID, Losartan 100mg  daily and amlodipine 5mg  daily  3. HLD -followed by PCP -LDL 72 in Oct 2020 -continue statin   Medication Adjustments/Labs and Tests Ordered: Current medicines are reviewed at length with the patient today.  Concerns regarding medicines are outlined above.  No orders of the defined types were placed in this encounter.  No orders of the defined types were placed in this encounter.   Signed, Fransico Him, MD  08/26/2019 9:48 AM    [place]

## 2019-08-26 ENCOUNTER — Other Ambulatory Visit: Payer: Self-pay

## 2019-08-26 ENCOUNTER — Ambulatory Visit (INDEPENDENT_AMBULATORY_CARE_PROVIDER_SITE_OTHER): Payer: Medicare HMO | Admitting: Cardiology

## 2019-08-26 ENCOUNTER — Encounter: Payer: Self-pay | Admitting: Cardiology

## 2019-08-26 VITALS — BP 112/68 | HR 92 | Ht 65.0 in | Wt 152.8 lb

## 2019-08-26 DIAGNOSIS — I4821 Permanent atrial fibrillation: Secondary | ICD-10-CM

## 2019-08-26 DIAGNOSIS — I10 Essential (primary) hypertension: Secondary | ICD-10-CM | POA: Diagnosis not present

## 2019-08-26 DIAGNOSIS — E785 Hyperlipidemia, unspecified: Secondary | ICD-10-CM

## 2019-08-26 NOTE — Patient Instructions (Signed)
Medication Instructions:  Your physician recommends that you continue on your current medications as directed. Please refer to the Current Medication list given to you today.  *If you need a refill on your cardiac medications before your next appointment, please call your pharmacy*  Follow-Up: At [facility], you and your health needs are our priority.  As part of our continuing mission to provide you with exceptional heart care, we have created designated Provider Care Teams.  These Care Teams include your primary Cardiologist (physician) and Advanced Practice Providers (APPs -  Physician Assistants and Nurse Practitioners) who all work together to provide you with the care you need, when you need it.  Your next appointment:   6 month(s)  The format for your next appointment:   Either In Person or Virtual  Provider:   Melina Copa, PA-C or Ermalinda Barrios, PA-C

## 2019-08-29 ENCOUNTER — Ambulatory Visit: Payer: Medicare HMO | Attending: Internal Medicine

## 2019-08-29 DIAGNOSIS — Z23 Encounter for immunization: Secondary | ICD-10-CM | POA: Insufficient documentation

## 2019-08-29 NOTE — Progress Notes (Signed)
   Covid-19 Vaccination Clinic  Name:  Mallory Stephens    MRN: [id number] DOB: Dec 15, 1932  08/29/2019  [name] was observed post Covid-19 immunization for 15 minutes without incidence. She was provided with Vaccine Information Sheet and instruction to access the V-Safe system.   [name] was instructed to call 911 with any severe reactions post vaccine: Marland Kitchen Difficulty breathing  . Swelling of your face and throat  . A fast heartbeat  . A bad rash all over your body  . Dizziness and weakness    Immunizations Administered    Name Date Dose VIS Date Route   Pfizer COVID-19 Vaccine 08/29/2019 10:52 AM 0.3 mL 07/15/2019 Intramuscular   Manufacturer: [place]   Lot: [id number]   [place]: [id number]

## 2019-09-19 ENCOUNTER — Ambulatory Visit: Payer: Medicare HMO | Attending: Internal Medicine

## 2019-09-19 DIAGNOSIS — Z23 Encounter for immunization: Secondary | ICD-10-CM | POA: Insufficient documentation

## 2019-09-19 NOTE — Progress Notes (Signed)
   Covid-19 Vaccination Clinic  Name:  Mallory Stephens    MRN: [id number] DOB: 11-Jun-1933  09/19/2019  [name] was observed post Covid-19 immunization for 15 minutes without incidence. She was provided with Vaccine Information Sheet and instruction to access the V-Safe system.   [name] was instructed to call 911 with any severe reactions post vaccine: Marland Kitchen Difficulty breathing  . Swelling of your face and throat  . A fast heartbeat  . A bad rash all over your body  . Dizziness and weakness    Immunizations Administered    Name Date Dose VIS Date Route   Pfizer COVID-19 Vaccine 09/19/2019 11:00 AM 0.3 mL 07/15/2019 Intramuscular   Manufacturer: [place]   Lot: [id number]   [place]: [id number]

## 2019-11-08 ENCOUNTER — Telehealth: Payer: Self-pay | Admitting: Cardiology

## 2019-11-08 DIAGNOSIS — R040 Epistaxis: Secondary | ICD-10-CM

## 2019-11-08 NOTE — Telephone Encounter (Signed)
Patient states she has been experiencing nose bleeding on and off since January 2021, however she has not had any other symptoms. Please return call to discuss.

## 2019-11-08 NOTE — Telephone Encounter (Signed)
Patient is still having nosebleeds every once in a while. It was discussed at there last office visit that if nosebleeds returned that she may need to be referred to ENT. Advised that I would forward to Dr. Radford Pax to see if she wants to place orders for ENT referral and if so someone will be reaching out to her to have appointment set up. Patient verbalized understanding.

## 2019-11-09 NOTE — Telephone Encounter (Signed)
Please refer to [facility] ENT for evaluation ASAP

## 2019-11-14 ENCOUNTER — Encounter: Payer: Self-pay | Admitting: Cardiology

## 2019-11-14 NOTE — Telephone Encounter (Signed)
error 

## 2019-11-18 ENCOUNTER — Other Ambulatory Visit: Payer: Self-pay

## 2019-11-18 ENCOUNTER — Ambulatory Visit (INDEPENDENT_AMBULATORY_CARE_PROVIDER_SITE_OTHER): Payer: Medicare HMO | Admitting: Otolaryngology

## 2019-11-18 VITALS — Temp 97.7°F

## 2019-11-18 DIAGNOSIS — R040 Epistaxis: Secondary | ICD-10-CM | POA: Diagnosis not present

## 2019-11-18 NOTE — Progress Notes (Signed)
HPI: Mallory Stephens is a 86 y.o. female who presents is referred by Dr. Radford Pax for evaluation of recurrent left-sided epistaxis.  This has been going on for several months now.  She is on Eliquis.  She presents today with her niece. The last time she had a nosebleed was about 4 days ago.  She has no trouble breathing through the nose.Marland Kitchen  Past Medical History:  Diagnosis Date  . Dyslipidemia   . Edema extremities 09/03/2016  . Fatty liver   . GERD (gastroesophageal reflux disease)   . Hypertension   . Lymphocytosis    followed by Dr. Julien Nordmann  . Multinodular goiter   . Osteoarthritis   . Permanent atrial fibrillation (HCC)    atrial fibrillation s/p DCCV 3/10 and 6/10  . PONV (postoperative nausea and vomiting)   . PUD (peptic ulcer disease)   . Pulmonary nodules    followed  by Dr. Stephanie Acre  . UTI (urinary tract infection) 04/2017   Past Surgical History:  Procedure Laterality Date  . aspiration of cyst  1978  . [place]   right breast for benign lesion  . BREAST SURGERY  1971   bilateral lumpectomy for beingn lesions  . CARDIOVERSION  07/22/2011   Procedure: CARDIOVERSION;  Surgeon: [name], MD;  Location: [place];  Service: Cardiovascular;  Laterality: N/A;  . CARDIOVERSION  09/02/2011   Procedure: CARDIOVERSION;  Surgeon: [name], MD;  Location: [place];  Service: Cardiovascular;  Laterality: N/A;  . CHOLECYSTECTOMY  2003  . CYSTOSCOPY    . DILATION AND CURETTAGE OF UTERUS  1979  . JOINT REPLACEMENT  2002   Right knee  . JOINT REPLACEMENT  2007   left knee   Social History   Socioeconomic History  . Marital status: Widowed    Spouse name: Not on file  . Number of children: Not on file  . Years of education: Not on file  . Highest education level: Not on file  Occupational History  . Not on file  Tobacco Use  . Smoking status: Former Smoker    Quit date: 07/16/1989    Years since quitting: 30.3  . Smokeless tobacco: Never Used  Substance  and Sexual Activity  . Alcohol use: Yes    Comment: occasional wine  . Drug use: No  . Sexual activity: Not on file  Other Topics Concern  . Not on file  Social History Narrative  . Not on file   Social Determinants of Health   Financial Resource Strain:   . Difficulty of Paying Living Expenses:   Food Insecurity:   . Worried About Charity fundraiser in the Last Year:   . Arboriculturist in the Last Year:   Transportation Needs:   . Film/video editor (Medical):   Marland Kitchen Lack of Transportation (Non-Medical):   Physical Activity:   . Days of Exercise per Week:   . Minutes of Exercise per Session:   Stress:   . Feeling of Stress :   Social Connections:   . Frequency of Communication with Friends and Family:   . Frequency of Social Gatherings with Friends and Family:   . Attends Religious Services:   . Active Member of Clubs or Organizations:   . Attends Archivist Meetings:   Marland Kitchen Marital Status:    Family History  Problem Relation Age of Onset  . Stroke Mother   . Stroke Father   . Breast cancer Sister   .  Heart disease Brother   . Prostate cancer Brother   . Prostate cancer Brother   . Arthritis Sister   . Scoliosis Sister    Allergies  Allergen Reactions  . Sulfa Drugs Cross Reactors Rash   Prior to Admission medications   Medication Sig Start Date End Date Taking? Authorizing Provider  acetaminophen (TYLENOL) 325 MG tablet Take 325 mg by mouth every 6 (six) hours as needed for moderate pain.   Yes [provider]  amLODipine (NORVASC) 5 MG tablet Take 1 tablet (5 mg total) by mouth daily. 03/01/19  Yes Turner, Eber Hong, MD  bisacodyl (DULCOLAX) 5 MG EC tablet Take 1 tablet (5 mg total) by mouth daily as needed for moderate constipation. 04/30/17  Yes Sheikh, Omair Latif, DO  ELIQUIS 5 MG TABS tablet TAKE 1 TABLET TWICE A DAY 07/06/19  Yes Turner, Traci R, MD  famotidine (PEPCID) 40 MG tablet Take 40 mg by mouth daily. 12/29/18  Yes [provider]  losartan (COZAAR) 100 MG tablet TAKE 1 TABLET BY MOUTH EVERY DAY 07/09/15  Yes Turner, Traci R, MD  metoprolol tartrate (LOPRESSOR) 25 MG tablet TAKE 1 TABLET TWICE A DAY 07/05/19  Yes Turner, Traci R, MD  PARoxetine (PAXIL) 10 MG tablet Take 10 mg by mouth every morning.    Yes [provider]  polyvinyl alcohol (LIQUIFILM TEARS) 1.4 % ophthalmic solution Place 1-2 drops into both eyes daily as needed for dry eyes.   Yes [provider]  pravastatin (PRAVACHOL) 20 MG tablet Take 20 mg by mouth at bedtime.    Yes [provider]  solifenacin (VESICARE) 5 MG tablet Take 5 mg by mouth daily.   Yes [provider]     Positive ROS: Otherwise negative  All other systems have been reviewed and were otherwise negative with the exception of those mentioned in the HPI and as above.  Physical Exam: Constitutional: Alert, well-appearing, no acute distress Ears: External ears without lesions or tenderness. Ear canals are clear bilaterally with intact, clear TMs.  Nasal: External nose without lesions. Septum with mild bowing to the left..  Nasal passages are otherwise clear.  The area of bleeding seems to be anterior inferior along the left side of the septum in Kiesselbach's plexus.  This was cauterized using silver nitrate in the office today. Oral: Lips and gums without lesions. Tongue and palate mucosa without lesions. Posterior oropharynx clear. Neck: No palpable adenopathy or masses Respiratory: Breathing comfortably  Skin: No facial/neck lesions or rash noted.  Control of epistaxis  Date/Time: 11/18/2019 5:43 PM Performed by: Rozetta Nunnery, MD Authorized by: Rozetta Nunnery, MD   Consent:    Consent obtained:  Verbal   Consent given by:  Patient   Risks discussed:  Bleeding and pain   Alternatives discussed:  No treatment and observation Anesthesia:    Anesthesia method:  None Procedure details:    Treatment site:  L  anterior   Treatment method:  Silver nitrate   Treatment complexity:  Limited Post-procedure details:    Patient tolerance of procedure:  Tolerated well, no immediate complications Comments:     Patient had a prominent anterior inferior septal vessel that was cauterized using silver nitrate in the office today.  She tolerated this well    Assessment: Recurrent left-sided epistaxis  Plan: This was cauterized using silver nitrate in the office today. Reviewed with patient as well as her niece concerning use of cotton ball packing and Afrin if she  has any further nosebleeds.  She will follow-up here if she has any further nosebleeds. Discussed with her concerning not blowing her nose hard and not picking off the scab over the next 2 weeks.   Radene Journey, MD   CC:

## 2020-01-07 ENCOUNTER — Other Ambulatory Visit: Payer: Self-pay | Admitting: Cardiology

## 2020-01-10 ENCOUNTER — Other Ambulatory Visit: Payer: Self-pay | Admitting: Cardiology

## 2020-01-12 NOTE — Telephone Encounter (Signed)
Prescription refill request for Eliquis received. Indication: A Fib Last office visit: 08/26/19 Scr: 1.26 Age: 86 Weight: 69.3 kg

## 2020-02-20 NOTE — Progress Notes (Signed)
Cardiology Office Note    Date:  02/22/2020   ID:  Mallory Stephens, DOB 03-21-1933, MRN [id number]  PCP:  [name], MD  Cardiologist: Fransico Him, MD EPS: None   Chief Complaint  Patient presents with   Follow-up    History of Present Illness:  Mallory Stephens is a 86 y.o. female with a hx of chronic atrial fibrillation and HTN and HLD   Saw Dr. Radford Pax 08/2019 and was doing well.  Patient comes in for f/u. Patient overall feels well. No palpitations, chest pain, dizziness or presyncope. She has some dyspnea if she is rushing going upstairs. No regular exercise since the pandemic.      Past Medical History:  Diagnosis Date   Dyslipidemia    Edema extremities 09/03/2016   Fatty liver    GERD (gastroesophageal reflux disease)    Hypertension    Lymphocytosis    followed by Dr. Julien Nordmann   Multinodular goiter    Osteoarthritis    Permanent atrial fibrillation (HCC)    atrial fibrillation s/p DCCV 3/10 and 6/10   PONV (postoperative nausea and vomiting)    PUD (peptic ulcer disease)    Pulmonary nodules    followed  by Dr. Stephanie Acre   UTI (urinary tract infection) 04/2017    Past Surgical History:  Procedure Laterality Date   aspiration of cyst  [place]   right breast for benign lesion   BREAST SURGERY  1971   bilateral lumpectomy for beingn lesions   CARDIOVERSION  07/22/2011   Procedure: CARDIOVERSION;  Surgeon: [name], MD;  Location: [facility];  Service: Cardiovascular;  Laterality: N/A;   CARDIOVERSION  09/02/2011   Procedure: CARDIOVERSION;  Surgeon: [name], MD;  Location: [facility];  Service: Cardiovascular;  Laterality: N/A;   CHOLECYSTECTOMY  2003   [place] REPLACEMENT  2002   Right knee   JOINT REPLACEMENT  2007   left knee    Current Medications: Current Meds  Medication Sig   acetaminophen (TYLENOL) 325 MG tablet Take 325 mg by  mouth every 6 (six) hours as needed for moderate pain.   amLODipine (NORVASC) 5 MG tablet TAKE 1 TABLET DAILY   bisacodyl (DULCOLAX) 5 MG EC tablet Take 1 tablet (5 mg total) by mouth daily as needed for moderate constipation.   ELIQUIS 5 MG TABS tablet TAKE 1 TABLET TWICE A DAY   famotidine (PEPCID) 40 MG tablet Take 40 mg by mouth daily.   losartan (COZAAR) 100 MG tablet TAKE 1 TABLET BY MOUTH EVERY DAY   metoprolol tartrate (LOPRESSOR) 25 MG tablet TAKE 1 TABLET TWICE A DAY   PARoxetine (PAXIL) 10 MG tablet Take 10 mg by mouth every morning.    polyvinyl alcohol (LIQUIFILM TEARS) 1.4 % ophthalmic solution Place 1-2 drops into both eyes daily as needed for dry eyes.   pravastatin (PRAVACHOL) 20 MG tablet Take 20 mg by mouth at bedtime.    solifenacin (VESICARE) 5 MG tablet Take 5 mg by mouth daily.     Allergies:   Sulfa drugs cross reactors   Social History   Socioeconomic History   Marital status: Widowed    Spouse name: Not on file   Number of children: Not on file   Years of education: Not on file   Highest education level: Not on file  Occupational History   Not on file  Tobacco Use   Smoking status: Former Smoker    Quit date: 07/16/1989    Years since quitting: 30.6   Smokeless tobacco: Never Used  Vaping Use   Vaping Use: Never used  Substance and Sexual Activity   Alcohol use: Yes    Comment: occasional wine   Drug use: No   Sexual activity: Not on file  Other Topics Concern   Not on file  Social History Narrative   Not on file   Social Determinants of Health   Financial Resource Strain:    Difficulty of Paying Living Expenses:   Food Insecurity:    Worried About Charity fundraiser in the Last Year:    Arboriculturist in the Last Year:   Transportation Needs:    Film/video editor (Medical):    Lack of Transportation (Non-Medical):   Physical Activity:    Days of Exercise per Week:    Minutes of Exercise per Session:    Stress:    Feeling of Stress :   Social Connections:    Frequency of Communication with Friends and Family:    Frequency of Social Gatherings with Friends and Family:    Attends Religious Services:    Active Member of Clubs or Organizations:    Attends Archivist Meetings:    Marital Status:      Family History:  The patient's family history includes Arthritis in her sister; Breast cancer in her sister; Heart disease in her brother; Prostate cancer in her brother and brother; Scoliosis in her sister; Stroke in her father and mother.   ROS:   Please see the history of present illness.    ROS All other systems reviewed and are negative.   PHYSICAL EXAM:   VS:  BP (!) 110/58    Pulse 68    Ht 5\' 5"  (1.651 m)    Wt 153 lb (69.4 kg)    BMI 25.46 kg/m   Physical Exam  GEN: Well nourished, well developed, in no acute distress  Neck: no JVD, carotid bruits, or masses Cardiac:irreg irreg; no murmurs, rubs, or gallops  Respiratory:  clear to auscultation bilaterally, normal work of breathing GI: soft, nontender, nondistended, + BS Ext: without cyanosis, clubbing, or edema, Good distal pulses bilaterally Neuro:  Alert and Oriented x 3 Psych: euthymic mood, full affect  Wt Readings from Last 3 Encounters:  02/22/20 153 lb (69.4 kg)  08/26/19 152 lb 12.8 oz (69.3 kg)  03/15/19 151 lb 12.8 oz (68.9 kg)      Studies/Labs Reviewed:   EKG:  EKG is  ordered today.  The ekg ordered today demonstrates Afib with CVR  Recent Labs: 06/01/2019: ALT 18   Lipid Panel    Component Value Date/Time   CHOL 133 06/01/2019 0958   TRIG 85 06/01/2019 0958   HDL 45 06/01/2019 0958   CHOLHDL 3.0 06/01/2019 0958   CHOLHDL 3 02/16/2014 0752   VLDL 22.4 02/16/2014 0752   LDLCALC 72 06/01/2019 0958    Additional studies/ records that were reviewed today include:  2D echo 04/2017 Study Conclusions   - Left ventricle: The cavity size was normal. Systolic function was   normal.  The estimated ejection fraction was in the range of 60%   to 65%. Wall motion was normal; there were no regional wall   motion abnormalities. The study is not technically sufficient to   allow evaluation of LV diastolic function. - Aortic valve: There was mild regurgitation. -  Mitral valve: Calcified annulus. Mildly thickened leaflets . - Left atrium: The atrium was moderately dilated. - Right atrium: The atrium was moderately dilated. - Atrial septum: No defect or patent foramen ovale was identified. - Pulmonary arteries: PA peak pressure: 33 mm Hg (S).     ASSESSMENT:    1. Permanent atrial fibrillation ([place])   2. Essential hypertension, benign   3. Dyslipidemia      PLAN:  In order of problems listed above:  Permanent atrial fibrillation on apixaban 5 mg twice daily and metoprolol. Doing well without symptoms. No bleeding problems.Check bmet and cbc today.  Essential hypertension BP controlled.   Hyperlipidemia LDL 72 06/01/19    Medication Adjustments/Labs and Tests Ordered: Current medicines are reviewed at length with the patient today.  Concerns regarding medicines are outlined above.  Medication changes, Labs and Tests ordered today are listed in the Patient Instructions below. Patient Instructions  Medication Instructions:  Your physician recommends that you continue on your current medications as directed. Please refer to the Current Medication list given to you today.  *If you need a refill on your cardiac medications before your next appointment, please call your pharmacy*   Lab Work: BMET, CBC Today  If you have labs (blood work) drawn today and your tests are completely normal, you will receive your results only by:  [place] (if you have MyChart) OR  A paper copy in the mail If you have any lab test that is abnormal or we need to change your treatment, we will call you to review the results.   Testing/Procedures: None   Follow-Up: At  [facility], you and your health needs are our priority.  As part of our continuing mission to provide you with exceptional heart care, we have created designated Provider Care Teams.  These Care Teams include your primary Cardiologist (physician) and Advanced Practice Providers (APPs -  Physician Assistants and Nurse Practitioners) who all work together to provide you with the care you need, when you need it.  We recommend signing up for the patient portal called "MyChart".  Sign up information is provided on this After Visit Summary.  MyChart is used to connect with patients for Virtual Visits (Telemedicine).  Patients are able to view lab/test results, encounter notes, upcoming appointments, etc.  Non-urgent messages can be sent to your provider as well.   To learn more about what you can do with MyChart, go to [email or web address].    Your next appointment:   6 month(s)  The format for your next appointment:   In Person  Provider:   You may see Fransico Him, MD or one of the following Advanced Practice Providers on your designated Care Team:    Melina Copa, PA-C  Ermalinda Barrios, PA-C    Other Instructions Your provider recommends that you maintain 150 minutes per week of moderate aerobic activity.     Sumner Boast, PA-C  02/22/2020 10:35 AM    [facility] [place], [place], [place]  [address] Phone: [phone]; Fax: [phone]

## 2020-02-22 ENCOUNTER — Other Ambulatory Visit: Payer: Self-pay

## 2020-02-22 ENCOUNTER — Ambulatory Visit (INDEPENDENT_AMBULATORY_CARE_PROVIDER_SITE_OTHER): Payer: Medicare HMO | Admitting: Physician Assistant

## 2020-02-22 ENCOUNTER — Encounter: Payer: Self-pay | Admitting: Physician Assistant

## 2020-02-22 VITALS — BP 110/58 | HR 68 | Ht 65.0 in | Wt 153.0 lb

## 2020-02-22 DIAGNOSIS — I4821 Permanent atrial fibrillation: Secondary | ICD-10-CM

## 2020-02-22 DIAGNOSIS — E785 Hyperlipidemia, unspecified: Secondary | ICD-10-CM | POA: Diagnosis not present

## 2020-02-22 DIAGNOSIS — I10 Essential (primary) hypertension: Secondary | ICD-10-CM

## 2020-02-22 NOTE — Patient Instructions (Signed)
Medication Instructions:  Your physician recommends that you continue on your current medications as directed. Please refer to the Current Medication list given to you today.  *If you need a refill on your cardiac medications before your next appointment, please call your pharmacy*   Lab Work: BMET, CBC Today  If you have labs (blood work) drawn today and your tests are completely normal, you will receive your results only by: [facility] MyChart Message (if you have MyChart) OR . A paper copy in the mail If you have any lab test that is abnormal or we need to change your treatment, we will call you to review the results.   Testing/Procedures: None   Follow-Up: At [facility], you and your health needs are our priority.  As part of our continuing mission to provide you with exceptional heart care, we have created designated Provider Care Teams.  These Care Teams include your primary Cardiologist (physician) and Advanced Practice Providers (APPs -  Physician Assistants and Nurse Practitioners) who all work together to provide you with the care you need, when you need it.  We recommend signing up for the patient portal called "MyChart".  Sign up information is provided on this After Visit Summary.  MyChart is used to connect with patients for Virtual Visits (Telemedicine).  Patients are able to view lab/test results, encounter notes, upcoming appointments, etc.  Non-urgent messages can be sent to your provider as well.   To learn more about what you can do with MyChart, go to [email or web address].    Your next appointment:   6 month(s)  The format for your next appointment:   In Person  Provider:   You may see Fransico Him, MD or one of the following Advanced Practice Providers on your designated Care Team:    Melina Copa, PA-C  Ermalinda Barrios, PA-C    Other Instructions Your provider recommends that you maintain 150 minutes per week of moderate aerobic activity.

## 2020-02-23 LAB — CBC
Hematocrit: 41.4 % (ref 34.0–46.6)
Hemoglobin: 13.6 g/dL (ref 11.1–15.9)
MCH: 30.5 pg (ref 26.6–33.0)
MCHC: 32.9 g/dL (ref 31.5–35.7)
MCV: 93 fL (ref 79–97)
Platelets: 241 10*3/uL (ref 150–450)
RBC: 4.46 x10E6/uL (ref 3.77–5.28)
RDW: 12.6 % (ref 11.7–15.4)
WBC: 12.3 10*3/uL — ABNORMAL HIGH (ref 3.4–10.8)

## 2020-02-23 LAB — BASIC METABOLIC PANEL
BUN/Creatinine Ratio: 16 (ref 12–28)
BUN: 20 mg/dL (ref 8–27)
CO2: 24 mmol/L (ref 20–29)
Calcium: 9.8 mg/dL (ref 8.7–10.3)
Chloride: 104 mmol/L (ref 96–106)
Creatinine, Ser: 1.28 mg/dL — ABNORMAL HIGH (ref 0.57–1.00)
GFR calc Af Amer: 44 mL/min/{1.73_m2} — ABNORMAL LOW (ref >59)
GFR calc non Af Amer: 38 mL/min/{1.73_m2} — ABNORMAL LOW (ref >59)
Glucose: 94 mg/dL (ref 65–99)
Potassium: 4.1 mmol/L (ref 3.5–5.2)
Sodium: 141 mmol/L (ref 134–144)

## 2020-07-03 ENCOUNTER — Encounter: Payer: Self-pay | Admitting: Cardiology

## 2020-07-03 NOTE — Telephone Encounter (Signed)
error 

## 2020-07-11 ENCOUNTER — Other Ambulatory Visit: Payer: Self-pay | Admitting: Cardiology

## 2020-07-11 ENCOUNTER — Other Ambulatory Visit: Payer: Self-pay | Admitting: Obstetrics & Gynecology

## 2020-07-11 DIAGNOSIS — R928 Other abnormal and inconclusive findings on diagnostic imaging of breast: Secondary | ICD-10-CM

## 2020-07-18 ENCOUNTER — Other Ambulatory Visit: Payer: Self-pay | Admitting: Cardiology

## 2020-07-18 NOTE — Telephone Encounter (Addendum)
Prescription refill request for Eliquis received.  Indication: afib  Last office visit:08/26/2019 Scr: 1.28, 02/22/2020 Age: 86 yo Weight: 69.4 kg   Prescription refill sent.

## 2020-07-23 ENCOUNTER — Ambulatory Visit
Admission: RE | Admit: 2020-07-23 | Discharge: 2020-07-23 | Disposition: A | Discharge status: Home / Self Care | Payer: Medicare HMO | Source: Ambulatory Visit | Attending: Obstetrics & Gynecology | Admitting: Obstetrics & Gynecology

## 2020-07-23 ENCOUNTER — Other Ambulatory Visit: Payer: Self-pay | Admitting: Obstetrics & Gynecology

## 2020-07-23 ENCOUNTER — Other Ambulatory Visit: Payer: Self-pay

## 2020-07-23 DIAGNOSIS — R928 Other abnormal and inconclusive findings on diagnostic imaging of breast: Secondary | ICD-10-CM

## 2020-07-24 ENCOUNTER — Ambulatory Visit
Admission: RE | Admit: 2020-07-24 | Discharge: 2020-07-24 | Disposition: A | Discharge status: Home / Self Care | Payer: Medicare HMO | Source: Ambulatory Visit | Attending: Obstetrics & Gynecology | Admitting: Obstetrics & Gynecology

## 2020-07-24 DIAGNOSIS — R928 Other abnormal and inconclusive findings on diagnostic imaging of breast: Secondary | ICD-10-CM

## 2020-07-24 DIAGNOSIS — C50919 Malignant neoplasm of unspecified site of unspecified female breast: Secondary | ICD-10-CM

## 2020-07-24 HISTORY — DX: Malignant neoplasm of unspecified site of unspecified female breast: C50.919

## 2020-07-26 ENCOUNTER — Other Ambulatory Visit: Payer: Self-pay | Admitting: Obstetrics & Gynecology

## 2020-07-26 DIAGNOSIS — Z780 Asymptomatic menopausal state: Secondary | ICD-10-CM

## 2020-08-13 ENCOUNTER — Other Ambulatory Visit: Payer: Self-pay | Admitting: General Surgery

## 2020-08-14 ENCOUNTER — Telehealth: Payer: Self-pay | Admitting: Oncology

## 2020-08-14 NOTE — Telephone Encounter (Signed)
Received a new pt referral from Dr. Barry Dienes for dx of breast cancer. Mallory Stephens has been cld and scheduled to see Dr. Jana Hakim on 1/17 at 4pm w/labs at 330pm. Pt aware to arrive 20 minutes early.

## 2020-08-15 ENCOUNTER — Ambulatory Visit: Payer: Self-pay | Admitting: Surgery

## 2020-08-16 ENCOUNTER — Other Ambulatory Visit: Payer: Self-pay | Admitting: Oncology

## 2020-08-17 ENCOUNTER — Other Ambulatory Visit: Payer: Self-pay | Admitting: General Surgery

## 2020-08-17 DIAGNOSIS — C50212 Malignant neoplasm of upper-inner quadrant of left female breast: Secondary | ICD-10-CM

## 2020-08-17 DIAGNOSIS — Z170 Estrogen receptor positive status [ER+]: Secondary | ICD-10-CM

## 2020-08-19 NOTE — Progress Notes (Signed)
Mallory Stephens  [phone] Fax:([phone]) [phone]     ID: Mallory Stephens DOB: Feb 28, 1933  MR#: [id number]  IOM#:[id number]  Patient Care Team: [name], MD as PCP - General (Family Medicine) Isidore Margraf, Virgie Dad, MD as Consulting Physician (Oncology) [name], MD as Consulting Physician (Radiation Oncology) [name], MD as Consulting Physician (General Surgery) [name], MD as Consulting Physician (Cardiology) [name], MD OTHER MD:  CHIEF COMPLAINT: Estrogen receptor positive breast cancer  CURRENT TREATMENT: Awaiting definitive surgery   HISTORY OF CURRENT ILLNESS: Mallory Stephens had routine screening mammography on           showing a possible abnormality in the left breast. She underwent left diagnostic mammography with tomography and left breast ultrasonography at [facility] on 07/23/2020 showing: breast density category C; 1.6 cm mass involving upper-inner left breast at 11 o'clock, with correlative palpable thickening; no pathologic left axillary lymphadenopathy.  Accordingly on 07/24/2020 she proceeded to biopsy of the left breast area in question. The pathology from this procedure ([id number]) showed: invasive and in situ mammary carcinoma, e-cadherin positive, grade 1-2. Prognostic indicators significant for: estrogen receptor, 95% positive and progesterone receptor, 70% positive, both with strong staining intensity. Proliferation marker Ki67 at 10%. HER2 equivocal by immunohistochemistry (2+), but negative by fluorescent in situ hybridization with a signals ratio 1.48 and number per cell 2.45.  The patient's subsequent history is as detailed below.   INTERVAL HISTORY: Mallory Stephens was evaluated in the breast cancer clinic on 08/20/2020 accompanied by                . Her case was also presented at the multidisciplinary breast cancer conference on                  . At that time a preliminary plan was proposed:   She is  scheduled to meet with Dr. Lisbeth Renshaw on 08/23/2020 to discuss adjuvant radiation therapy. She is also scheduled for left lumpectomy on 09/06/2020 under Dr. Barry Dienes.   REVIEW OF SYSTEMS: There were no specific symptoms leading to the original mammogram, which was routinely scheduled. The patient denies unusual headaches, visual changes, nausea, vomiting, stiff neck, dizziness, or gait imbalance. There has been no cough, phlegm production, or pleurisy, no chest pain or pressure, and no change in bowel or bladder habits. The patient denies fever, rash, bleeding, unexplained fatigue or unexplained weight loss. A detailed review of systems was otherwise entirely negative.   COVID 19 VACCINATION STATUS: [employer] x2, most recently 09/2019   PAST MEDICAL HISTORY: Past Medical History:  Diagnosis Date  . Dyslipidemia   . Edema extremities 09/03/2016  . Fatty liver   . GERD (gastroesophageal reflux disease)   . Hypertension   . Lymphocytosis    followed by Dr. Julien Nordmann  . Multinodular goiter   . Osteoarthritis   . Permanent atrial fibrillation (HCC)    atrial fibrillation s/Stephens DCCV 3/10 and 6/10  . PONV (postoperative nausea and vomiting)   . PUD (peptic ulcer disease)   . Pulmonary nodules    followed  by Dr. Stephanie Acre  . UTI (urinary tract infection) 04/2017    PAST SURGICAL HISTORY: Past Surgical History:  Procedure Laterality Date  . aspiration of cyst  1978  . [place]   right breast for benign lesion  . BREAST SURGERY  1971   bilateral lumpectomy for beingn lesions  . CARDIOVERSION  07/22/2011   Procedure: CARDIOVERSION;  Surgeon: Eber Hong  Radford Pax, MD;  Location: [facility];  Service: Cardiovascular;  Laterality: N/A;  . CARDIOVERSION  09/02/2011   Procedure: CARDIOVERSION;  Surgeon: [name], MD;  Location: [facility];  Service: Cardiovascular;  Laterality: N/A;  . CHOLECYSTECTOMY  2003  . CYSTOSCOPY    . DILATION AND CURETTAGE OF UTERUS  1979  . JOINT REPLACEMENT  2002   Right knee   . JOINT REPLACEMENT  2007   left knee    FAMILY HISTORY: Family History  Problem Relation Age of Onset  . Stroke Mother   . Stroke Father   . Breast cancer Sister   . Heart disease Brother   . Prostate cancer Brother   . Prostate cancer Brother   . Arthritis Sister   . Scoliosis Sister     She reports breast cancer in a sister and prostate cancer in two brothers.   GYNECOLOGIC HISTORY:  No LMP recorded. Patient is postmenopausal. Menarche: 46 years old Mallory Stephens 0 LMP  Contraceptive HRT   Hysterectomy? no BSO? no   SOCIAL HISTORY: (updated 08/2020)  Mallory Stephens is currently retired from working as a . She is widowed.  She lives at home with . She attends .    ADVANCED DIRECTIVES:    HEALTH MAINTENANCE: Social History   Tobacco Use  . Smoking status: Former Smoker    Quit date: 07/16/1989    Years since quitting: 31.1  . Smokeless tobacco: Never Used  Vaping Use  . Vaping Use: Never used  Substance Use Topics  . Alcohol use: Yes    Comment: occasional wine  . Drug use: No     Colonoscopy:   PAP:   Bone density: repeat scheduled 11/2020   Allergies  Allergen Reactions  . Sulfa Drugs Cross Reactors Rash    Current Outpatient Medications  Medication Sig Dispense Refill  . acetaminophen (TYLENOL) 325 MG tablet Take 325 mg by mouth every 6 (six) hours as needed for moderate pain.    Marland Kitchen amLODipine (NORVASC) 5 MG tablet TAKE 1 TABLET DAILY 90 tablet 2  . bisacodyl (DULCOLAX) 5 MG EC tablet Take 1 tablet (5 mg total) by mouth daily as needed for moderate constipation. 30 tablet 0  . ELIQUIS 5 MG TABS tablet TAKE 1 TABLET TWICE A DAY 180 tablet 1  . famotidine (PEPCID) 40 MG tablet Take 40 mg by mouth daily.    Marland Kitchen losartan (COZAAR) 100 MG tablet TAKE 1 TABLET BY MOUTH EVERY DAY 90 tablet 1  . metoprolol tartrate (LOPRESSOR) 25 MG tablet TAKE 1 TABLET TWICE A DAY 180 tablet 2  . PARoxetine (PAXIL) 10 MG tablet Take 10 mg by mouth every morning.     . polyvinyl alcohol  (LIQUIFILM TEARS) 1.4 % ophthalmic solution Place 1-2 drops into both eyes daily as needed for dry eyes.    . pravastatin (PRAVACHOL) 20 MG tablet Take 20 mg by mouth at bedtime.     . solifenacin (VESICARE) 5 MG tablet Take 5 mg by mouth daily.     No current facility-administered medications for this visit.    OBJECTIVE:   There were no vitals filed for this visit.   There is no height or weight on file to calculate BMI.   Wt Readings from Last 3 Encounters:  02/22/20 153 lb (69.4 kg)  08/26/19 152 lb 12.8 oz (69.3 kg)  03/15/19 151 lb 12.8 oz (68.9 kg)      ECOG FS:  Ocular: Sclerae unicteric, pupils round and equal Ear-nose-throat: Wearing  a mask Lymphatic: No cervical or supraclavicular adenopathy Lungs no rales or rhonchi Heart regular rate and rhythm Abd soft, nontender, positive bowel sounds MSK no focal spinal tenderness, no joint edema Neuro: non-focal, well-oriented, appropriate affect Breasts:    LAB RESULTS:  CMP     Component Value Date/Time   NA 141 02/22/2020 1041   K 4.1 02/22/2020 1041   CL 104 02/22/2020 1041   CO2 24 02/22/2020 1041   GLUCOSE 94 02/22/2020 1041   GLUCOSE 103 (H) 04/30/2017 0417   BUN 20 02/22/2020 1041   CREATININE 1.28 (H) 02/22/2020 1041   CREATININE 1.32 (H) 08/09/2015 1122   CALCIUM 9.8 02/22/2020 1041   PROT 6.5 06/01/2019 0958   ALBUMIN 4.2 06/01/2019 0958   AST 17 06/01/2019 0958   ALT 18 06/01/2019 0958   ALKPHOS 47 06/01/2019 0958   BILITOT 0.7 06/01/2019 0958   GFRNONAA 38 (L) 02/22/2020 1041   GFRAA 44 (L) 02/22/2020 1041    No results found for: TOTALPROTELP, ALBUMINELP, A1GS, A2GS, BETS, BETA2SER, GAMS, MSPIKE, SPEI  Lab Results  Component Value Date   WBC 12.3 (H) 02/22/2020   NEUTROABS 7.1 04/30/2017   HGB 13.6 02/22/2020   HCT 41.4 02/22/2020   MCV 93 02/22/2020   PLT 241 02/22/2020    No results found for: LABCA2  No components found for: EXNTZG017  No results for input(s): INR in the last 168  hours.  No results found for: LABCA2  No results found for: CBS496  No results found for: PRF163  No results found for: WGY659  No results found for: CA2729  No components found for: HGQUANT  No results found for: CEA1 / No results found for: CEA1   No results found for: AFPTUMOR  No results found for: CHROMOGRNA  No results found for: KPAFRELGTCHN, LAMBDASER, KAPLAMBRATIO (kappa/lambda light chains)  No results found for: HGBA, HGBA2QUANT, HGBFQUANT, HGBSQUAN (Hemoglobinopathy evaluation)   Lab Results  Component Value Date   LDH 164 02/22/2009    No results found for: IRON, TIBC, IRONPCTSAT (Iron and TIBC)  No results found for: FERRITIN  Urinalysis    Component Value Date/Time   COLORURINE YELLOW 04/28/2017 1310   APPEARANCEUR CLEAR 04/28/2017 1310   LABSPEC 1.011 04/28/2017 1310   PHURINE 5.0 04/28/2017 1310   GLUCOSEU NEGATIVE 04/28/2017 1310   HGBUR MODERATE (A) 04/28/2017 1310   BILIRUBINUR NEGATIVE 04/28/2017 1310   KETONESUR NEGATIVE 04/28/2017 1310   PROTEINUR NEGATIVE 04/28/2017 1310   UROBILINOGEN 0.2 06/23/2010 1322   NITRITE POSITIVE (A) 04/28/2017 1310   LEUKOCYTESUR NEGATIVE 04/28/2017 1310     STUDIES: US BREAST LTD UNI LEFT INC AXILLA  Result Date: 07/23/2020 CLINICAL DATA:  Recall from outside screening mammography with tomosynthesis, possible asymmetry associated with architectural distortion in the UPPER INNER LEFT breast at MIDDLE depth. Remote prior benign excisional biopsy from the UPPER OUTER QUADRANT of the LEFT breast. Family history of breast cancer in her sister. EXAM: DIGITAL DIAGNOSTIC LEFT MAMMOGRAM WITH CAD AND TOMO ULTRASOUND LEFT BREAST COMPARISON:  Previous exam(s). ACR Breast Density Category c: The breast tissue is heterogeneously dense, which may obscure small masses. FINDINGS: Tomosynthesis and synthesized spot-compression CC and MLO views of the area of concern in the LEFT breast and a tomosynthesis and synthesized  full field mediolateral view of the LEFT breast were obtained. The full field mediolateral was processed with CAD. The asymmetry in the UPPER INNER QUADRANT at MIDDLE depth persists on the spot compression images and there is persistent architectural  distortion. No suspicious findings elsewhere on the full field mediolateral images. Targeted ultrasound is performed, showing an irregular hypoechoic mass with vague margins at the 11 o'clock position approximately 4 cm from the nipple measuring approximately 1.6 x 0.8 x 1.3 cm, associated with architectural distortion, demonstrating posterior acoustic shadowing and demonstrating peripheral power Doppler flow, corresponding to the screening mammographic finding. Sonographic evaluation of the LEFT axilla demonstrates no pathologic lymphadenopathy. On correlative physical examination, there is palpable thickening involving the UPPER INNER QUADRANT of the LEFT breast. The patient's surgical scar from the prior excisional biopsy is in the [place], so the current finding does not correlate with the prior surgical scar. IMPRESSION: 1. Highly suspicious 1.6 cm mass involving the UPPER INNER QUADRANT of the LEFT breast at the 11 o'clock position approximately 4 cm from the nipple. 2. No pathologic LEFT axillary lymphadenopathy. RECOMMENDATION: Ultrasound-guided core needle biopsy of the LEFT breast mass. The ultrasound core needle biopsy procedure was discussed with the patient and her questions were answered. She wishes to proceed and the biopsy has been scheduled at her convenience. I have discussed the findings and recommendations with the patient. BI-RADS CATEGORY  5: Highly suggestive of malignancy. Electronically Signed   By: Evangeline Dakin M.D.   On: 07/23/2020 12:14   MM DIAG BREAST TOMO UNI LEFT  Result Date: 07/23/2020 CLINICAL DATA:  Recall from outside screening mammography with tomosynthesis, possible asymmetry associated with architectural  distortion in the UPPER INNER LEFT breast at MIDDLE depth. Remote prior benign excisional biopsy from the UPPER OUTER QUADRANT of the LEFT breast. Family history of breast cancer in her sister. EXAM: DIGITAL DIAGNOSTIC LEFT MAMMOGRAM WITH CAD AND TOMO ULTRASOUND LEFT BREAST COMPARISON:  Previous exam(s). ACR Breast Density Category c: The breast tissue is heterogeneously dense, which may obscure small masses. FINDINGS: Tomosynthesis and synthesized spot-compression CC and MLO views of the area of concern in the LEFT breast and a tomosynthesis and synthesized full field mediolateral view of the LEFT breast were obtained. The full field mediolateral was processed with CAD. The asymmetry in the UPPER INNER QUADRANT at MIDDLE depth persists on the spot compression images and there is persistent architectural distortion. No suspicious findings elsewhere on the full field mediolateral images. Targeted ultrasound is performed, showing an irregular hypoechoic mass with vague margins at the 11 o'clock position approximately 4 cm from the nipple measuring approximately 1.6 x 0.8 x 1.3 cm, associated with architectural distortion, demonstrating posterior acoustic shadowing and demonstrating peripheral power Doppler flow, corresponding to the screening mammographic finding. Sonographic evaluation of the LEFT axilla demonstrates no pathologic lymphadenopathy. On correlative physical examination, there is palpable thickening involving the UPPER INNER QUADRANT of the LEFT breast. The patient's surgical scar from the prior excisional biopsy is in the [place], so the current finding does not correlate with the prior surgical scar. IMPRESSION: 1. Highly suspicious 1.6 cm mass involving the UPPER INNER QUADRANT of the LEFT breast at the 11 o'clock position approximately 4 cm from the nipple. 2. No pathologic LEFT axillary lymphadenopathy. RECOMMENDATION: Ultrasound-guided core needle biopsy of the LEFT breast mass. The  ultrasound core needle biopsy procedure was discussed with the patient and her questions were answered. She wishes to proceed and the biopsy has been scheduled at her convenience. I have discussed the findings and recommendations with the patient. BI-RADS CATEGORY  5: Highly suggestive of malignancy. Electronically Signed   By: Evangeline Dakin M.D.   On: 07/23/2020 12:14   MM CLIP PLACEMENT  LEFT  Result Date: 07/24/2020 CLINICAL DATA:  87 year old female with a suspicious left breast mass. EXAM: DIAGNOSTIC LEFT MAMMOGRAM POST ULTRASOUND BIOPSY COMPARISON:  Previous exam(s). FINDINGS: Mammographic images were obtained following ultrasound guided biopsy of the left breast. The biopsy marking clip is in expected position at the site of biopsy. IMPRESSION: Appropriate positioning of the ribbon shaped biopsy marking clip at the site of biopsy in the upper inner left breast. Final Assessment: Post Procedure Mammograms for Marker Placement Electronically Signed   By: Kristopher Oppenheim M.D.   On: 07/24/2020 15:19   [place] LT BREAST BX W LOC DEV 1ST LESION IMG BX SPEC US GUIDE  Addendum Date: 08/02/2020   ADDENDUM REPORT: 07/26/2020 15:46 ADDENDUM: Pathology revealed GRADE I/II INVASIVE MAMMARY CARCINOMA, MAMMARY CARCINOMA IN SITU of the LEFT breast, 11 o'clock, 4cmfn. E-cadherin is POSITIVE supporting a ductal origin. This was found to be concordant by Dr. Kristopher Oppenheim. Pathology results were discussed with the patient by telephone. The patient reported doing well after the biopsy with tenderness at the site. Post biopsy instructions and care were reviewed and questions were answered. The patient was encouraged to call [facility] for any additional concerns. Surgical consultation has been arranged with Dr. [name] at [facility] on August 13, 2020. Pathology results reported by Stacie Acres RN on 07/26/2020. Electronically Signed   By: Kristopher Oppenheim M.D.   On: 07/26/2020  15:46   Result Date: 08/02/2020 CLINICAL DATA:  87 year old female with a suspicious left breast mass. EXAM: ULTRASOUND GUIDED LEFT BREAST CORE NEEDLE BIOPSY COMPARISON:  Previous exam(s). PROCEDURE: I met with the patient and we discussed the procedure of ultrasound-guided biopsy, including benefits and alternatives. We discussed the high likelihood of a successful procedure. We discussed the risks of the procedure, including infection, bleeding, tissue injury, clip migration, and inadequate sampling. Informed written consent was given. The usual time-out protocol was performed immediately prior to the procedure. Lesion quadrant: Upper inner quadrant Using sterile technique and 1% Lidocaine as local anesthetic, under direct ultrasound visualization, a 12 gauge spring-loaded device was used to perform biopsy of a mass at the 11 o'clock position using a inferior approach. At the conclusion of the procedure a ribbon shaped tissue marker clip was deployed into the biopsy cavity. Follow up 2 view mammogram was performed and dictated separately. IMPRESSION: Ultrasound guided biopsy of the left breast. No apparent complications. Electronically Signed: By: Kristopher Oppenheim M.D. On: 07/24/2020 15:18     ELIGIBLE FOR AVAILABLE RESEARCH PROTOCOL:   ASSESSMENT: 87 y.o. [place] woman status post left breast upper inner quadrant biopsy 07/24/2020 for a clinical T1c N0, stage IA invasive ductal carcinoma, grade 1 or 2, estrogen and progesterone receptor positive, HER2 not amplified, with an MIB-1 of 10%  (1) definitive surgery pending  (2) adjuvant radiation as appropriate  (3) antiestrogens  PLAN: The patient was scheduled to see me 08/20/2020 but because of bad weather as she was unable to make it.  We are rescheduling her appointment to August 29, 2020.   Virgie Dad. Quron Ruddy, MD 08/20/2020 2:28 PM Medical Oncology and Hematology [facility] [place], [place] [address] Tel.  [phone]    Fax. [phone]   This document serves as a record of services personally performed by Lurline Del, MD. It was created on his behalf by Wilburn Mylar, a trained medical scribe. The creation of this record is based on the scribe's personal observations and the provider's statements  to them.     *Total Encounter Time as defined by the Centers for Medicare and Medicaid Services includes, in addition to the face-to-face time of a patient visit (documented in the note above) non-face-to-face time: obtaining and reviewing outside history, ordering and reviewing medications, tests or procedures, care coordination (communications with other health care professionals or caregivers) and documentation in the medical record.

## 2020-08-20 ENCOUNTER — Inpatient Hospital Stay (HOSPITAL_BASED_OUTPATIENT_CLINIC_OR_DEPARTMENT_OTHER): Payer: Medicare HMO | Admitting: Oncology

## 2020-08-20 ENCOUNTER — Inpatient Hospital Stay: Payer: Medicare HMO

## 2020-08-20 DIAGNOSIS — C50212 Malignant neoplasm of upper-inner quadrant of left female breast: Secondary | ICD-10-CM

## 2020-08-20 DIAGNOSIS — Z170 Estrogen receptor positive status [ER+]: Secondary | ICD-10-CM

## 2020-08-20 HISTORY — DX: Malignant neoplasm of upper-inner quadrant of left female breast: Z17.0

## 2020-08-20 HISTORY — DX: Malignant neoplasm of upper-inner quadrant of left female breast: C50.212

## 2020-08-21 ENCOUNTER — Encounter: Payer: Self-pay | Admitting: *Deleted

## 2020-08-21 NOTE — Progress Notes (Signed)
Location of Breast Cancer: Left Breast UIQ  Did patient present with symptoms (if so, please note symptoms) or was this found on screening mammography?: Routine Mammogram  Mammogram 07/23/2020: Architectural distortion with possible asymmetry.  Irregular mass at 11 o'clock 1.6 cm in greatest dimension.  Left axilla was negative.  Histology per Pathology Report: Left Breast UIQ 07/24/2021  Receptor Status: ER(+95%), PR (+70%), Her2-neu (-), Ki-67(10%)    Past/Anticipated interventions by surgeon, if any: Dr. Barry Dienes 08/13/2020 -Given her age and low grade tumor, we will just do seed localized lumpectomy. -I will refer her to medical and radiation oncology post-op. -Left Breast Lumpectomy 09/06/2020  Past/Anticipated interventions by medical oncology, if any: Chemotherapy  Dr. Jana Hakim 08/29/2020   Lymphedema issues, if any:  No  Pain issues, if any:  No  SAFETY ISSUES:  Prior radiation? No  Pacemaker/ICD? No  Possible current pregnancy? Postmenopausal  Is the patient on methotrexate?   Current Complaints / other details:   -2 brothers with prostate cancer -1 sister with breast cancer -On eliquis for Afib    Cori Razor, RN 08/21/2020,5:45 PM

## 2020-08-22 ENCOUNTER — Encounter: Payer: Self-pay | Admitting: Cardiology

## 2020-08-22 ENCOUNTER — Ambulatory Visit (INDEPENDENT_AMBULATORY_CARE_PROVIDER_SITE_OTHER): Payer: Medicare HMO | Admitting: Cardiology

## 2020-08-22 ENCOUNTER — Other Ambulatory Visit: Payer: Self-pay

## 2020-08-22 VITALS — BP 126/70 | HR 73 | Ht 66.0 in | Wt 153.0 lb

## 2020-08-22 DIAGNOSIS — I10 Essential (primary) hypertension: Secondary | ICD-10-CM

## 2020-08-22 DIAGNOSIS — E785 Hyperlipidemia, unspecified: Secondary | ICD-10-CM

## 2020-08-22 DIAGNOSIS — Z01818 Encounter for other preprocedural examination: Secondary | ICD-10-CM

## 2020-08-22 DIAGNOSIS — I4821 Permanent atrial fibrillation: Secondary | ICD-10-CM | POA: Diagnosis not present

## 2020-08-22 NOTE — Progress Notes (Signed)
Cardiology Office Note:    Date:  08/22/2020   ID:  Mallory Stephens, DOB 1933/06/06, MRN [id number]  PCP:  [name], MD  Cardiologist:  No primary care provider on file.    Referring MD: [name], MD   Chief Complaint  Patient presents with  . Atrial Fibrillation  . Hypertension    History of Present Illness:    Mallory Stephens is a 87 y.o. female with a hx of chronic atrial fibrillation and HTN. She is here today for followup and is doing well.  She denies any chest pain or pressure, SOB, DOE, PND, orthopnea, dizziness, palpitations or syncope. She occasionally will have some mild LE edema at the end of the day.  She is compliant with her meds and is tolerating meds with no SE.    Past Medical History:  Diagnosis Date  . Dyslipidemia   . Edema extremities 09/03/2016  . Fatty liver   . GERD (gastroesophageal reflux disease)   . Hypertension   . Lymphocytosis    followed by Dr. Julien Nordmann  . Multinodular goiter   . Osteoarthritis   . Permanent atrial fibrillation (HCC)    atrial fibrillation s/p DCCV 3/10 and 6/10  . PONV (postoperative nausea and vomiting)   . PUD (peptic ulcer disease)   . Pulmonary nodules    followed  by Dr. Stephanie Acre  . UTI (urinary tract infection) 04/2017    Past Surgical History:  Procedure Laterality Date  . aspiration of cyst  1978  . [place]   right breast for benign lesion  . BREAST SURGERY  1971   bilateral lumpectomy for beingn lesions  . CARDIOVERSION  07/22/2011   Procedure: CARDIOVERSION;  Surgeon: [name], MD;  Location: [facility];  Service: Cardiovascular;  Laterality: N/A;  . CARDIOVERSION  09/02/2011   Procedure: CARDIOVERSION;  Surgeon: [name], MD;  Location: [facility];  Service: Cardiovascular;  Laterality: N/A;  . CHOLECYSTECTOMY  2003  . CYSTOSCOPY    . DILATION AND CURETTAGE OF UTERUS  1979  . JOINT REPLACEMENT  2002   Right knee  . JOINT REPLACEMENT  2007   left knee    Current  Medications: Current Meds  Medication Sig  . acetaminophen (TYLENOL) 325 MG tablet Take 325 mg by mouth every 6 (six) hours as needed for moderate pain.  Marland Kitchen amLODipine (NORVASC) 5 MG tablet TAKE 1 TABLET DAILY  . bisacodyl (DULCOLAX) 5 MG EC tablet Take 1 tablet (5 mg total) by mouth daily as needed for moderate constipation.  Marland Kitchen ELIQUIS 5 MG TABS tablet TAKE 1 TABLET TWICE A DAY  . famotidine (PEPCID) 40 MG tablet Take 40 mg by mouth daily.  Marland Kitchen losartan (COZAAR) 100 MG tablet TAKE 1 TABLET BY MOUTH EVERY DAY  . metoprolol tartrate (LOPRESSOR) 25 MG tablet TAKE 1 TABLET TWICE A DAY  . PARoxetine (PAXIL) 10 MG tablet Take 10 mg by mouth every morning.  . polyvinyl alcohol (LIQUIFILM TEARS) 1.4 % ophthalmic solution Place 1-2 drops into both eyes daily as needed for dry eyes.  . pravastatin (PRAVACHOL) 20 MG tablet Take 20 mg by mouth at bedtime.  . solifenacin (VESICARE) 5 MG tablet Take 5 mg by mouth daily.     Allergies:   Sulfa drugs cross reactors   Social History   Socioeconomic History  . Marital status: Widowed    Spouse name: Not on file  . Number of children: Not on file  . Years of  education: Not on file  . Highest education level: Not on file  Occupational History  . Not on file  Tobacco Use  . Smoking status: Former Smoker    Quit date: 07/16/1989    Years since quitting: 31.1  . Smokeless tobacco: Never Used  Vaping Use  . Vaping Use: Never used  Substance and Sexual Activity  . Alcohol use: Yes    Comment: occasional wine  . Drug use: No  . Sexual activity: Not on file  Other Topics Concern  . Not on file  Social History Narrative  . Not on file   Social Determinants of Health   Financial Resource Strain: Not on file  Food Insecurity: Not on file  Transportation Needs: Not on file  Physical Activity: Not on file  Stress: Not on file  Social Connections: Not on file     Family History: The patient's family history includes Arthritis in her sister;  Breast cancer in her sister; Heart disease in her brother; Prostate cancer in her brother and brother; Scoliosis in her sister; Stroke in her father and mother.  ROS:   Please see the history of present illness.    Review of Systems  Musculoskeletal: Negative for falls, gout and muscle cramps.    All other systems reviewed and negative.   EKGs/Labs/Other Studies Reviewed:    The following studies were reviewed today:  labs  EKG:  EKG is not  ordered today.    Recent Labs: 02/22/2020: BUN 20; Creatinine, Ser 1.28; Hemoglobin 13.6; Platelets 241; Potassium 4.1; Sodium 141   Recent Lipid Panel    Component Value Date/Time   CHOL 133 06/01/2019 0958   TRIG 85 06/01/2019 0958   HDL 45 06/01/2019 0958   CHOLHDL 3.0 06/01/2019 0958   CHOLHDL 3 02/16/2014 0752   VLDL 22.4 02/16/2014 0752   LDLCALC 72 06/01/2019 0958    Physical Exam:    VS:  BP 126/70   Pulse 73   Ht 5\' 6"  (1.676 m)   Wt 153 lb (69.4 kg)   SpO2 93%   BMI 24.69 kg/m     Wt Readings from Last 3 Encounters:  08/22/20 153 lb (69.4 kg)  02/22/20 153 lb (69.4 kg)  08/26/19 152 lb 12.8 oz (69.3 kg)     GEN: Well nourished, well developed in no acute distress HEENT: Normal NECK: No JVD; No carotid bruits LYMPHATICS: No lymphadenopathy CARDIAC:irregularly irregular, no murmurs, rubs, gallops RESPIRATORY:  Clear to auscultation without rales, wheezing or rhonchi  ABDOMEN: Soft, non-tender, non-distended MUSCULOSKELETAL:  No edema; No deformity  SKIN: Warm and dry NEUROLOGIC:  Alert and oriented x 3 PSYCHIATRIC:  Normal affect    ASSESSMENT:    1. Permanent atrial fibrillation ([place])   2. Essential hypertension, benign   3. Dyslipidemia   4. Preoperative clearance    PLAN:    In order of problems listed above:  1.  Permanent atrial fibrillation -her HR is adequately controlled on exam today -she has not had any bleeding issues on DOAC -SCr was 1.28 and Hbg 13.6 in July 2021 -continue Eliquis 5mg   BID and Lopressor 25mg  BID -check 2D echo since she has not had one done to assess LVF in several years.   2.  HTN -BP is well controlled on exam today -continue Lopressor 25mg  BID, Losartan 100mg  daily and amlodipine 5mg  daily  3. HLD -followed by PCP -LDL was 80 in Dec 2020 -continue statin  4.  Preoperative Clearance -she has breast Ca  and is getting a lumpectomy -She can achieve 6.6 mets on the Duke Activity Index with no problems and therefore no further ischemic workup is indicated at this time -By the revised Cardiac Risk index she has a 0.4% perioperative risk of major cardiac event.  Medication Adjustments/Labs and Tests Ordered: Current medicines are reviewed at length with the patient today.  Concerns regarding medicines are outlined above.  No orders of the defined types were placed in this encounter.  No orders of the defined types were placed in this encounter.   Signed, Fransico Him, MD  08/22/2020 10:36 AM    [place]

## 2020-08-22 NOTE — Patient Instructions (Signed)

## 2020-08-23 ENCOUNTER — Ambulatory Visit
Admission: RE | Admit: 2020-08-23 | Discharge: 2020-08-23 | Disposition: A | Discharge status: Home / Self Care | Payer: Medicare HMO | Source: Ambulatory Visit | Attending: Radiation Oncology | Admitting: Radiation Oncology

## 2020-08-23 ENCOUNTER — Encounter: Payer: Self-pay | Admitting: Licensed Clinical Social Worker

## 2020-08-23 ENCOUNTER — Encounter: Payer: Self-pay | Admitting: Radiation Oncology

## 2020-08-23 ENCOUNTER — Other Ambulatory Visit: Payer: Self-pay

## 2020-08-23 VITALS — BP 142/79 | HR 64 | Temp 96.9°F | Resp 18 | Ht 66.0 in | Wt 152.0 lb

## 2020-08-23 DIAGNOSIS — Z803 Family history of malignant neoplasm of breast: Secondary | ICD-10-CM | POA: Insufficient documentation

## 2020-08-23 DIAGNOSIS — Z87442 Personal history of urinary calculi: Secondary | ICD-10-CM | POA: Diagnosis not present

## 2020-08-23 DIAGNOSIS — Z87891 Personal history of nicotine dependence: Secondary | ICD-10-CM | POA: Insufficient documentation

## 2020-08-23 DIAGNOSIS — C50212 Malignant neoplasm of upper-inner quadrant of left female breast: Secondary | ICD-10-CM | POA: Insufficient documentation

## 2020-08-23 DIAGNOSIS — Z7901 Long term (current) use of anticoagulants: Secondary | ICD-10-CM | POA: Insufficient documentation

## 2020-08-23 DIAGNOSIS — I10 Essential (primary) hypertension: Secondary | ICD-10-CM | POA: Diagnosis not present

## 2020-08-23 DIAGNOSIS — Z170 Estrogen receptor positive status [ER+]: Secondary | ICD-10-CM

## 2020-08-23 DIAGNOSIS — Z8042 Family history of malignant neoplasm of prostate: Secondary | ICD-10-CM | POA: Diagnosis not present

## 2020-08-23 DIAGNOSIS — I4821 Permanent atrial fibrillation: Secondary | ICD-10-CM | POA: Insufficient documentation

## 2020-08-23 DIAGNOSIS — Z79899 Other long term (current) drug therapy: Secondary | ICD-10-CM | POA: Insufficient documentation

## 2020-08-23 DIAGNOSIS — M1990 Unspecified osteoarthritis, unspecified site: Secondary | ICD-10-CM | POA: Insufficient documentation

## 2020-08-23 DIAGNOSIS — K219 Gastro-esophageal reflux disease without esophagitis: Secondary | ICD-10-CM | POA: Insufficient documentation

## 2020-08-23 DIAGNOSIS — Z8711 Personal history of peptic ulcer disease: Secondary | ICD-10-CM | POA: Diagnosis not present

## 2020-08-23 DIAGNOSIS — E785 Hyperlipidemia, unspecified: Secondary | ICD-10-CM | POA: Diagnosis not present

## 2020-08-23 NOTE — Progress Notes (Signed)
Radiation Oncology         [phone] ________________________________  Name: LAELANI VASKO        MRN: [id number]  Date of Service: 08/23/2020 DOB: 06-23-1933  CC:[name], MD  [name], MD     REFERRING PHYSICIAN: [name], MD   DIAGNOSIS: The encounter diagnosis was Malignant neoplasm of upper-inner quadrant of left breast in female, estrogen receptor positive ([place]).   HISTORY OF PRESENT ILLNESS: Mallory Stephens is a 87 y.o. female seen today for a new breast cancer diagnosis in the left breast.  The patient had gone for routine mammography which showed a possible abnormality in the left breast, diagnostic imaging revealed a distortion and asymmetry in the upper inner quadrant, and targeted ultrasound showed a lesion at the 11 o'clock position measuring 1.6 cm with associated distortion and posterior acoustic shadowing.  Her axilla was negative for adenopathy, and she underwent a biopsy on 07/24/2020 which revealed an invasive ductal carcinoma grade 1-2, her cancer was ER/PR positive HER2 was negative by FISH and her Ki-67 was 10%.  She has met with Dr. Barry Dienes as well as Dr. Jana Hakim and has been offered lumpectomy which she plans to pursue on 09/06/2020.  She is seen today to discuss additional adjuvant therapies.   PREVIOUS RADIATION THERAPY: No   PAST MEDICAL HISTORY:  Past Medical History:  Diagnosis Date  . Dyslipidemia   . Edema extremities 09/03/2016  . Fatty liver   . GERD (gastroesophageal reflux disease)   . Hypertension   . Lymphocytosis    followed by Dr. Julien Nordmann  . Multinodular goiter   . Osteoarthritis   . Permanent atrial fibrillation (HCC)    atrial fibrillation s/p DCCV 3/10 and 6/10  . PONV (postoperative nausea and vomiting)   . PUD (peptic ulcer disease)   . Pulmonary nodules    followed  by Dr. Stephanie Acre  . UTI (urinary tract infection) 04/2017       PAST SURGICAL HISTORY: Past Surgical History:  Procedure Laterality Date  .  aspiration of cyst  1978  . [place]   right breast for benign lesion  . BREAST SURGERY  1971   bilateral lumpectomy for beingn lesions  . CARDIOVERSION  07/22/2011   Procedure: CARDIOVERSION;  Surgeon: [name], MD;  Location: [facility];  Service: Cardiovascular;  Laterality: N/A;  . CARDIOVERSION  09/02/2011   Procedure: CARDIOVERSION;  Surgeon: [name], MD;  Location: [facility];  Service: Cardiovascular;  Laterality: N/A;  . CHOLECYSTECTOMY  2003  . CYSTOSCOPY    . DILATION AND CURETTAGE OF UTERUS  1979  . JOINT REPLACEMENT  2002   Right knee  . JOINT REPLACEMENT  2007   left knee     FAMILY HISTORY:  Family History  Problem Relation Age of Onset  . Stroke Mother   . Stroke Father   . Breast cancer Sister   . Heart disease Brother   . Prostate cancer Brother   . Prostate cancer Brother   . Arthritis Sister   . Scoliosis Sister      SOCIAL HISTORY:  reports that she quit smoking about 31 years ago. She has never used smokeless tobacco. She reports current alcohol use. She reports that she does not use drugs. The patient is widowed and resides in [place] with her sister. She is still very active and drives, and is accompanied by her niece.   ALLERGIES: Sulfa drugs cross reactors   MEDICATIONS:  Current  Outpatient Medications  Medication Sig Dispense Refill  . acetaminophen (TYLENOL) 325 MG tablet Take 325 mg by mouth every 6 (six) hours as needed for moderate pain.    Marland Kitchen amLODipine (NORVASC) 5 MG tablet TAKE 1 TABLET DAILY 90 tablet 2  . bisacodyl (DULCOLAX) 5 MG EC tablet Take 1 tablet (5 mg total) by mouth daily as needed for moderate constipation. 30 tablet 0  . ELIQUIS 5 MG TABS tablet TAKE 1 TABLET TWICE A DAY 180 tablet 1  . famotidine (PEPCID) 40 MG tablet Take 40 mg by mouth daily.    Marland Kitchen losartan (COZAAR) 100 MG tablet TAKE 1 TABLET BY MOUTH EVERY DAY 90 tablet 1  . metoprolol tartrate (LOPRESSOR) 25 MG tablet TAKE 1 TABLET TWICE A DAY 180  tablet 2  . PARoxetine (PAXIL) 10 MG tablet Take 10 mg by mouth every morning.    . polyvinyl alcohol (LIQUIFILM TEARS) 1.4 % ophthalmic solution Place 1-2 drops into both eyes daily as needed for dry eyes.    . pravastatin (PRAVACHOL) 20 MG tablet Take 20 mg by mouth at bedtime.    . solifenacin (VESICARE) 5 MG tablet Take 5 mg by mouth daily.     No current facility-administered medications for this encounter.     REVIEW OF SYSTEMS: On review of systems, the patient reports that she is doing well overall. She denies any pain since her biopsy. No other complaints are verbalized.    PHYSICAL EXAM:  Wt Readings from Last 3 Encounters:  08/22/20 153 lb (69.4 kg)  02/22/20 153 lb (69.4 kg)  08/26/19 152 lb 12.8 oz (69.3 kg)   Temp Readings from Last 3 Encounters:  11/18/19 97.7 F (36.5 C) (Tympanic)  04/30/17 98 F (36.7 C) (Oral)  10/23/15 97.8 F (36.6 C) (Oral)   BP Readings from Last 3 Encounters:  08/22/20 126/70  02/22/20 (!) 110/58  08/26/19 112/68   Pulse Readings from Last 3 Encounters:  08/22/20 73  02/22/20 68  08/26/19 92    In general this is a well appearing caucasian female in no acute distress. She's alert and oriented x4 and appropriate throughout the examination. Cardiopulmonary assessment is negative for acute distress and she exhibits normal effort. Bilateral breast exam is deferred.    ECOG = 0  0 - Asymptomatic (Fully active, able to carry on all predisease activities without restriction)  1 - Symptomatic but completely ambulatory (Restricted in physically strenuous activity but ambulatory and able to carry out work of a light or sedentary nature. For example, light housework, office work)  2 - Symptomatic, <50% in bed during the day (Ambulatory and capable of all self care but unable to carry out any work activities. Up and about more than 50% of waking hours)  3 - Symptomatic, >50% in bed, but not bedbound (Capable of only limited self-care,  confined to bed or chair 50% or more of waking hours)  4 - Bedbound (Completely disabled. Cannot carry on any self-care. Totally confined to bed or chair)  5 - Death   Eustace Pen MM, Creech RH, Tormey DC, et al. [phone]). "Toxicity and response criteria of the [facility] Group". [place] Oncol. 5 (6): 649-55    LABORATORY DATA:  Lab Results  Component Value Date   WBC 12.3 (H) 02/22/2020   HGB 13.6 02/22/2020   HCT 41.4 02/22/2020   MCV 93 02/22/2020   PLT 241 02/22/2020   Lab Results  Component Value Date   NA 141 02/22/2020  K 4.1 02/22/2020   CL 104 02/22/2020   CO2 24 02/22/2020   Lab Results  Component Value Date   ALT 18 06/01/2019   AST 17 06/01/2019   ALKPHOS 47 06/01/2019   BILITOT 0.7 06/01/2019      RADIOGRAPHY: MM CLIP PLACEMENT LEFT  Result Date: 07/24/2020 CLINICAL DATA:  87 year old female with a suspicious left breast mass. EXAM: DIAGNOSTIC LEFT MAMMOGRAM POST ULTRASOUND BIOPSY COMPARISON:  Previous exam(s). FINDINGS: Mammographic images were obtained following ultrasound guided biopsy of the left breast. The biopsy marking clip is in expected position at the site of biopsy. IMPRESSION: Appropriate positioning of the ribbon shaped biopsy marking clip at the site of biopsy in the upper inner left breast. Final Assessment: Post Procedure Mammograms for Marker Placement Electronically Signed   By: Kristopher Oppenheim M.D.   On: 07/24/2020 15:19   [place] LT BREAST BX W LOC DEV 1ST LESION IMG BX SPEC US GUIDE  Addendum Date: 08/02/2020   ADDENDUM REPORT: 07/26/2020 15:46 ADDENDUM: Pathology revealed GRADE I/II INVASIVE MAMMARY CARCINOMA, MAMMARY CARCINOMA IN SITU of the LEFT breast, 11 o'clock, 4cmfn. E-cadherin is POSITIVE supporting a ductal origin. This was found to be concordant by Dr. Kristopher Oppenheim. Pathology results were discussed with the patient by telephone. The patient reported doing well after the biopsy with tenderness at the site. Post biopsy  instructions and care were reviewed and questions were answered. The patient was encouraged to call [facility] for any additional concerns. Surgical consultation has been arranged with Dr. [name] at [facility] on August 13, 2020. Pathology results reported by Stacie Acres RN on 07/26/2020. Electronically Signed   By: Kristopher Oppenheim M.D.   On: 07/26/2020 15:46   Result Date: 08/02/2020 CLINICAL DATA:  87 year old female with a suspicious left breast mass. EXAM: ULTRASOUND GUIDED LEFT BREAST CORE NEEDLE BIOPSY COMPARISON:  Previous exam(s). PROCEDURE: I met with the patient and we discussed the procedure of ultrasound-guided biopsy, including benefits and alternatives. We discussed the high likelihood of a successful procedure. We discussed the risks of the procedure, including infection, bleeding, tissue injury, clip migration, and inadequate sampling. Informed written consent was given. The usual time-out protocol was performed immediately prior to the procedure. Lesion quadrant: Upper inner quadrant Using sterile technique and 1% Lidocaine as local anesthetic, under direct ultrasound visualization, a 12 gauge spring-loaded device was used to perform biopsy of a mass at the 11 o'clock position using a inferior approach. At the conclusion of the procedure a ribbon shaped tissue marker clip was deployed into the biopsy cavity. Follow up 2 view mammogram was performed and dictated separately. IMPRESSION: Ultrasound guided biopsy of the left breast. No apparent complications. Electronically Signed: By: Kristopher Oppenheim M.D. On: 07/24/2020 15:18       IMPRESSION/PLAN: 1. Stage IA, cT1cN0M0 grade 1-2, ER/PR positive invasive ductal carcinoma of the left breast. Dr. Lisbeth Renshaw discusses the pathology findings and reviews the nature of left breast disease.  She has plans to undergo lumpectomy on 09/06/2020.  We discussed that we would follow-up with the results of her surgery  and can be back to discuss the options of adjuvant radiotherapy.  Her tumor does appear to be more favorable and while external radiotherapy to the breast can reduce the risks of local recurrence, she may be able to forego this.  We did discuss however that this is standard of care for younger patients and would be an option if she responds to remain aggressive  about her treatment.  Dr. Jana Hakim anticipates a role for adjuvant antiestrogen therapy. We discussed the risks, benefits, short, and long term effects of radiotherapy, as well as the curative intent, and the patient is interested in considering her options at the conclusion of her upcoming surgery. Dr. Lisbeth Renshaw discusses the delivery and logistics of radiotherapy and would offer  a course of 4 weeks of radiotherapy if she desires radiotherapy. We will follow up with her final surgical results and review options to proceed at that time.  In a visit lasting 60 minutes, greater than 50% of the time was spent face to face reviewing her case, as well as in preparation of, discussing, and coordinating the patient's care.  The above documentation reflects my direct findings during this shared patient visit. Please see the separate note by Dr. Lisbeth Renshaw on this date for the remainder of the patient's plan of care.    Carola Rhine, PAC

## 2020-08-23 NOTE — Progress Notes (Signed)
[facility] Psychosocial Distress Screening Clinical Social Work  Clinical Social Work was referred by distress screening protocol.  The patient scored a 6 on the Psychosocial Distress Thermometer which indicates moderate distress. Clinical Social Worker contacted patient by phone to assess for distress and other psychosocial needs.  Patient reports feeling a little overwhelmed by information, but grateful she has time to consider her options. She has good support from her niece who went to her appointment with her today as well as her sister. No resource needs (food, transportation, housing) at this time.  CSW informed patient of the support team and support services at [facility].  CSW provided contact information and encouraged patient to call with any questions or concerns.  ONCBCN DISTRESS SCREENING 08/23/2020  Screening Type Initial Screening  Distress experienced in past week (1-10) 6  Emotional problem type Adjusting to illness  Physical Problem type Skin dry/itchy  Other Contact via phone    Clinical Social Worker follow up needed: No.  If yes, follow up plan:  Mallory Auxier E Brenan [place], LCSW

## 2020-08-28 ENCOUNTER — Other Ambulatory Visit: Payer: Self-pay | Admitting: *Deleted

## 2020-08-28 DIAGNOSIS — Z170 Estrogen receptor positive status [ER+]: Secondary | ICD-10-CM

## 2020-08-28 DIAGNOSIS — C50212 Malignant neoplasm of upper-inner quadrant of left female breast: Secondary | ICD-10-CM

## 2020-08-28 NOTE — Progress Notes (Signed)
[facility]  [phone] [phone]     ID: Mallory Stephens DOB: 14-Jun-1933  MR#: [id number]  MBW#:[id number]  Patient Care Team: Mallory Jordan, MD as PCP - General (Family Medicine) Mallory Stephens, Mallory Dad, MD as Consulting Physician (Oncology) Mallory Rudd, MD as Consulting Physician (Radiation Oncology) Mallory Klein, MD as Consulting Physician (General Surgery) Mallory Margarita, MD as Consulting Physician (Cardiology) Mallory Kaufmann, RN as Oncology Nurse Navigator Mallory Germany, RN as Oncology Nurse Navigator Mallory Fallen, MD as Consulting Physician (Obstetrics and Gynecology) Mallory Brownie, MD as Referring Physician (Dermatology) Mallory Cruel, MD OTHER MD:  CHIEF COMPLAINT: Estrogen receptor positive breast cancer  CURRENT TREATMENT: Awaiting definitive surgery; on Eliquis   HISTORY OF CURRENT ILLNESS: Mallory Stephens had routine screening mammography showing a possible abnormality in the left breast. She underwent left diagnostic mammography with tomography and left breast ultrasonography at [facility] on 07/23/2020 showing: breast density category C; 1.6 cm mass involving upper-inner left breast at 11 o'clock, with correlative palpable thickening; no pathologic left axillary lymphadenopathy.  Accordingly on 07/24/2020 she proceeded to biopsy of the left breast area in question. The pathology from this procedure ([id number]) showed: invasive and in situ mammary carcinoma, e-cadherin positive, grade 1-2. Prognostic indicators significant for: estrogen receptor, 95% positive and progesterone receptor, 70% positive, both with strong staining intensity. Proliferation marker Ki67 at 10%. HER2 equivocal by immunohistochemistry (2+), but negative by fluorescent in situ hybridization with a signals ratio 1.48 and number per cell 2.45.  Cancer Staging Malignant neoplasm of upper-inner quadrant of left breast in female, estrogen  receptor positive (Mallory Stephens) Staging form: Breast, AJCC 8th Edition - Clinical stage from 08/23/2020: Stage IA (cT1c, cN0, cM0, G2, ER+, PR+, HER2-) - Signed by Mallory Cruel, MD on 08/29/2020  The patient's subsequent history is as detailed below.   INTERVAL HISTORY: Mallory Stephens was evaluated in the breast cancer clinic on 08/20/2020 accompanied by her niece Mallory Stephens.    She met with Mallory Stephens on 08/23/2020 to discuss adjuvant radiation therapy. Depending on final pathology results from surgery, the patient may have the option to forego radiation treatment.  She is scheduled for left lumpectomy on 09/06/2020 under Mallory Stephens.   REVIEW OF SYSTEMS: There were no specific symptoms leading to the original mammogram, which was routinely scheduled. The patient denies unusual headaches, visual changes, nausea, vomiting, stiff neck, dizziness, or gait imbalance. There has been no cough, phlegm production, or pleurisy, no chest pain or pressure, and no change in bowel or bladder habits. The patient denies fever, rash, bleeding, unexplained fatigue or unexplained weight loss.  She exercises mostly by doing housework but used to go to the gym before the pandemic and occasionally takes walks outside.  A detailed review of systems was otherwise entirely negative.   COVID 19 VACCINATION STATUS: Mallory Stephens x2, with booster February 2021   PAST MEDICAL HISTORY: Past Medical History:  Diagnosis Date  . Breast cancer ([facility]) 07/24/2020  . Dyslipidemia   . Edema extremities 09/03/2016  . Fatty liver   . GERD (gastroesophageal reflux disease)   . Hypertension   . Lymphocytosis    followed by Mallory Stephens  . Multinodular goiter   . Osteoarthritis   . Permanent atrial fibrillation ([facility])    atrial fibrillation s/p DCCV 3/10 and 6/10  . PONV (postoperative nausea and vomiting)   . PUD (peptic ulcer disease)   . Pulmonary nodules    followed  by Mallory Stephens  .  UTI (urinary tract infection) 04/2017    PAST SURGICAL  HISTORY: Past Surgical History:  Procedure Laterality Date  . aspiration of cyst  1978  . [place]   right breast for benign lesion  . BREAST SURGERY  1971   bilateral lumpectomy for beingn lesions  . CARDIOVERSION  07/22/2011   Procedure: CARDIOVERSION;  Surgeon: Mallory Margarita, MD;  Location: [facility];  Service: Cardiovascular;  Laterality: N/A;  . CARDIOVERSION  09/02/2011   Procedure: CARDIOVERSION;  Surgeon: Mallory Margarita, MD;  Location: [facility];  Service: Cardiovascular;  Laterality: N/A;  . CHOLECYSTECTOMY  2003  . CYSTOSCOPY    . DILATION AND CURETTAGE OF UTERUS  1979  . JOINT REPLACEMENT  2002   Right knee  . JOINT REPLACEMENT  2007   left knee    FAMILY HISTORY: Family History  Problem Relation Age of Onset  . Stroke Mother   . Stroke Father   . Breast cancer Sister   . Heart disease Brother   . Prostate cancer Brother   . Prostate cancer Brother   . Arthritis Sister   . Scoliosis Sister   The patient's father died in his 81s from a ruptured aneurysm.  The patient's mother died from a stroke in her 78s.  The patient has 1 cousin with breast cancer diagnosed around age 71.  She also has 4 brothers, 2 of whom had prostate cancer, and 3 sisters, 1 of whom had breast cancer diagnosed in her 88s.  There is no history of ovarian or pancreatic cancer in the family to her knowledge.   GYNECOLOGIC HISTORY:  No LMP recorded. Patient is postmenopausal. Menarche: 87 years old [place] P 0 LMP  Contraceptive more than 5 years, with no complications HRT   at least 5 years Hysterectomy? no BSO? 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  She is widowed.  She and her sister Mallory Stephens share a home.    ADVANCED DIRECTIVES: The patient has named her Sister Mallory Stephens and her niece Mallory Stephens as Equities trader powers of attorney.   HEALTH MAINTENANCE: Social History   Tobacco Use  . Smoking status: Former Smoker     Quit date: 07/16/1989    Years since quitting: 31.1  . Smokeless tobacco: Never Used  Vaping Use  . Vaping Use: Never used  Substance Use Topics  . Alcohol use: Yes    Comment: occasional wine  . Drug use: No     Colonoscopy: Around age 89, in [place]  PAP: Per Dr. Genia Harold  Bone density: repeat scheduled 11/2020   Allergies  Allergen Reactions  . Sulfa Drugs Cross Reactors Rash    Current Outpatient Medications  Medication Sig Dispense Refill  . acetaminophen (TYLENOL) 325 MG tablet Take 325 mg by mouth every 6 (six) hours as needed for moderate pain.    Marland Kitchen amLODipine (NORVASC) 5 MG tablet TAKE 1 TABLET DAILY (Patient taking differently: Take 5 mg by mouth daily.) 90 tablet 2  . Capsaicin-Menthol (SALONPAS GEL-PATCH HOT EX) Apply 1 tablet topically daily as needed (lower back pain).    Marland Kitchen ELIQUIS 5 MG TABS tablet TAKE 1 TABLET TWICE A DAY (Patient taking differently: Take 5 mg by mouth 2 (two) times daily.) 180 tablet 1  . famotidine (PEPCID) 40 MG tablet Take 40 mg by mouth at bedtime.    Marland Kitchen losartan (COZAAR) 100 MG tablet TAKE 1 TABLET BY MOUTH EVERY DAY (  Patient taking differently: Take 100 mg by mouth daily.) 90 tablet 1  . metoprolol tartrate (LOPRESSOR) 25 MG tablet TAKE 1 TABLET TWICE A DAY (Patient taking differently: Take 25 mg by mouth 2 (two) times daily.) 180 tablet 2  . Multiple Vitamin (MULTIVITAMIN WITH MINERALS) TABS tablet Take 1 tablet by mouth daily.    Marland Kitchen PARoxetine (PAXIL) 10 MG tablet Take 10 mg by mouth every morning.    . polyvinyl alcohol (LIQUIFILM TEARS) 1.4 % ophthalmic solution Place 1-2 drops into both eyes every 6 (six) hours as needed for dry eyes.    . pravastatin (PRAVACHOL) 20 MG tablet Take 20 mg by mouth at bedtime.    . solifenacin (VESICARE) 5 MG tablet Take 5 mg by mouth daily.     No current facility-administered medications for this visit.    OBJECTIVE: White woman who appears younger than stated age  24:   08/29/20 1538  BP: (!) 121/50   Pulse: 63  Resp: 18  Temp: 98.3 F (36.8 C)  SpO2: 96%     Body mass index is 24.63 kg/m.   Wt Readings from Last 3 Encounters:  08/29/20 152 lb 9.6 oz (69.2 kg)  08/23/20 152 lb (68.9 kg)  08/22/20 153 lb (69.4 kg)      ECOG FS:  Ocular: Sclerae unicteric, pupils round and equal Ear-nose-throat: Wearing a mask Lymphatic: No cervical or supraclavicular adenopathy Lungs no rales or rhonchi Heart regular rate and rhythm Abd soft, nontender, positive bowel sounds MSK no focal spinal tenderness, no joint edema Neuro: non-focal, well-oriented, appropriate affect Breasts: The right breast is unremarkable.  The left breast is status post recent biopsy.  There is moderate ecchymosis.  Both axillae are benign.   LAB RESULTS:  CMP     Component Value Date/Time   NA 141 08/29/2020 1519   NA 141 02/22/2020 1041   K 4.9 08/29/2020 1519   CL 106 08/29/2020 1519   CO2 29 08/29/2020 1519   GLUCOSE 120 (H) 08/29/2020 1519   BUN 24 (H) 08/29/2020 1519   BUN 20 02/22/2020 1041   CREATININE 1.50 (H) 08/29/2020 1519   CREATININE 1.32 (H) 08/09/2015 1122   CALCIUM 10.0 08/29/2020 1519   PROT 7.0 08/29/2020 1519   PROT 6.5 06/01/2019 0958   ALBUMIN 4.0 08/29/2020 1519   ALBUMIN 4.2 06/01/2019 0958   AST 16 08/29/2020 1519   ALT 14 08/29/2020 1519   ALKPHOS 42 08/29/2020 1519   BILITOT 0.8 08/29/2020 1519   GFRNONAA 34 (L) 08/29/2020 1519   GFRAA 44 (L) 02/22/2020 1041    No results found for: TOTALPROTELP, ALBUMINELP, A1GS, A2GS, BETS, BETA2SER, GAMS, MSPIKE, SPEI  Lab Results  Component Value Date   WBC 13.9 (H) 08/29/2020   NEUTROABS 6.0 08/29/2020   HGB 14.2 08/29/2020   HCT 43.3 08/29/2020   MCV 93.1 08/29/2020   PLT 241 08/29/2020    No results found for: LABCA2  No components found for: EKCMKL491  No results for input(s): INR in the last 168 hours.  No results found for: LABCA2  No results found for: PHX505  No results found for: WPV948  No results found  for: AXK553  No results found for: CA2729  No components found for: HGQUANT  No results found for: CEA1 / No results found for: CEA1   No results found for: AFPTUMOR  No results found for: CHROMOGRNA  No results found for: KPAFRELGTCHN, LAMBDASER, KAPLAMBRATIO (kappa/lambda light chains)  No results found for: HGBA, HGBA2QUANT,  HGBFQUANT, HGBSQUAN (Hemoglobinopathy evaluation)   Lab Results  Component Value Date   LDH 164 02/22/2009    No results found for: IRON, TIBC, IRONPCTSAT (Iron and TIBC)  No results found for: FERRITIN  Urinalysis    Component Value Date/Time   COLORURINE YELLOW 04/28/2017 [place] 04/28/2017 1310   LABSPEC 1.011 04/28/2017 1310   PHURINE 5.0 04/28/2017 1310   GLUCOSEU NEGATIVE 04/28/2017 1310   HGBUR MODERATE (A) 04/28/2017 1310   BILIRUBINUR NEGATIVE 04/28/2017 1310   KETONESUR NEGATIVE 04/28/2017 1310   PROTEINUR NEGATIVE 04/28/2017 1310   UROBILINOGEN 0.2 06/23/2010 1322   NITRITE POSITIVE (A) 04/28/2017 1310   LEUKOCYTESUR NEGATIVE 04/28/2017 1310    STUDIES: No results found.   ELIGIBLE FOR AVAILABLE RESEARCH PROTOCOL: AET  ASSESSMENT: 87 y.o. [place] woman status post left breast upper inner quadrant biopsy 07/24/2020 for a clinical T1c N0, stage IA invasive ductal carcinoma, grade 1 or 2, estrogen and progesterone receptor positive, HER2 not amplified, with an MIB-1 of 10%  (1) definitive surgery pending  (2) opted against adjuvant radiation  (3) to start anastrozole 10/03/2019   PLAN: We first reviewed the fact that cancer is not one disease but more than 100 different diseases and that it is important to keep them separate-- otherwise when friends and relatives discuss their own cancer experiences with Mallory Stephens confusion can result. Similarly we explained that if breast cancer spreads to the bone or liver, the patient would not have bone cancer or liver cancer, but breast cancer in the bone and breast  cancer in the liver: one cancer in three places-- not 3 different cancers which otherwise would have to be treated in 3 different ways.  We discussed the difference between local and systemic therapy. In terms of loco-regional treatment, lumpectomy plus radiation is equivalent to mastectomy as far as survival is concerned. For this reason, and because the cosmetic results are generally superior, we recommend breast conserving surgery.   We then discussed the rationale for systemic therapy. There is some risk that this cancer may have already spread to other parts of her body. Patients frequently ask at this point about bone scans, CAT scans and PET scans to find out if they have occult breast cancer somewhere else. The problem is that in early stage disease we are much more likely to find false positives then true cancers and this would expose the patient to unnecessary procedures as well as unnecessary radiation. Scans cannot answer the question the patient really would like to know, which is whether she has microscopic disease elsewhere in her body. For those reasons we do not recommend them.  Of course we would proceed to aggressive evaluation of any symptoms that might suggest metastatic disease, but that is not the case here.  Next we went over the options for systemic therapy which are anti-estrogens, anti-HER-2 immunotherapy, and chemotherapy. Damyia does not meet criteria for anti-HER-2 immunotherapy. She is a good candidate for anti-estrogens.  The question of chemotherapy is more complicated. Chemotherapy is most effective in rapidly growing, aggressive tumors. It is much less effective in low-grade, slow growing cancers, like Lilliana 's. For that reason and given the patient's age as well we are not considering chemotherapy in this case.  Similarly we discussed the fact that in patients over 70 with small node-negative estrogen receptor positive tumors adjuvant radiation can be omitted without  compromising survival.  This had a ready been discussed with the patient by Mallory Stephens.  The patient  prefers to avoid radiation and simply go from surgery to antiestrogens.  The plan is to start anastrozole 10/02/2020.  She has a good understanding of the possible toxicities complications and side effects.  This information was given to her in writing.  Peni does qualify for genetics testing. In patients who carry a deleterious mutation [for example in a  BRCA gene], the risk of a new breast cancer developing in the future may be sufficiently great that the patient may choose bilateral mastectomies. However if she wishes to keep her breasts in that situation it is safe to do so. That would require intensified screening, which generally means not only yearly mammography but a yearly breast MRI as well.  We will set her up for genetics testing the day she returns to see me late April  Total encounter time 65 minutes.Sarajane Jews C. Gaila Engebretsen, MD 08/29/2020 5:09 PM Medical Oncology and Hematology [facility] [place], [place] [address] Tel. [phone]    Fax. [phone]   I, Wilburn Mylar, am acting as scribe for Dr. Virgie Stephens. Zain Lankford.  I, Lurline Del MD, have reviewed the above documentation for accuracy and completeness, and I agree with the above.    *Total Encounter Time as defined by the Centers for Medicare and Medicaid Services includes, in addition to the face-to-face time of a patient visit (documented in the note above) non-face-to-face time: obtaining and reviewing outside history, ordering and reviewing medications, tests or procedures, care coordination (communications with other health care professionals or caregivers) and documentation in the medical record.

## 2020-08-29 ENCOUNTER — Inpatient Hospital Stay: Payer: Medicare HMO | Attending: Oncology

## 2020-08-29 ENCOUNTER — Inpatient Hospital Stay (HOSPITAL_BASED_OUTPATIENT_CLINIC_OR_DEPARTMENT_OTHER): Payer: Medicare HMO | Admitting: Oncology

## 2020-08-29 ENCOUNTER — Other Ambulatory Visit: Payer: Self-pay

## 2020-08-29 VITALS — BP 121/50 | HR 63 | Temp 98.3°F | Resp 18 | Ht 66.0 in | Wt 152.6 lb

## 2020-08-29 DIAGNOSIS — Z8269 Family history of other diseases of the musculoskeletal system and connective tissue: Secondary | ICD-10-CM | POA: Insufficient documentation

## 2020-08-29 DIAGNOSIS — Z87440 Personal history of urinary (tract) infections: Secondary | ICD-10-CM | POA: Diagnosis not present

## 2020-08-29 DIAGNOSIS — Z823 Family history of stroke: Secondary | ICD-10-CM | POA: Diagnosis not present

## 2020-08-29 DIAGNOSIS — Z882 Allergy status to sulfonamides status: Secondary | ICD-10-CM | POA: Diagnosis not present

## 2020-08-29 DIAGNOSIS — E785 Hyperlipidemia, unspecified: Secondary | ICD-10-CM

## 2020-08-29 DIAGNOSIS — I4821 Permanent atrial fibrillation: Secondary | ICD-10-CM | POA: Diagnosis not present

## 2020-08-29 DIAGNOSIS — Z8261 Family history of arthritis: Secondary | ICD-10-CM | POA: Insufficient documentation

## 2020-08-29 DIAGNOSIS — Z79811 Long term (current) use of aromatase inhibitors: Secondary | ICD-10-CM | POA: Insufficient documentation

## 2020-08-29 DIAGNOSIS — Z7901 Long term (current) use of anticoagulants: Secondary | ICD-10-CM | POA: Insufficient documentation

## 2020-08-29 DIAGNOSIS — Z8711 Personal history of peptic ulcer disease: Secondary | ICD-10-CM | POA: Insufficient documentation

## 2020-08-29 DIAGNOSIS — Z87891 Personal history of nicotine dependence: Secondary | ICD-10-CM

## 2020-08-29 DIAGNOSIS — Z8042 Family history of malignant neoplasm of prostate: Secondary | ICD-10-CM | POA: Insufficient documentation

## 2020-08-29 DIAGNOSIS — I10 Essential (primary) hypertension: Secondary | ICD-10-CM | POA: Insufficient documentation

## 2020-08-29 DIAGNOSIS — C50212 Malignant neoplasm of upper-inner quadrant of left female breast: Secondary | ICD-10-CM | POA: Insufficient documentation

## 2020-08-29 DIAGNOSIS — R55 Syncope and collapse: Secondary | ICD-10-CM | POA: Diagnosis not present

## 2020-08-29 DIAGNOSIS — Z170 Estrogen receptor positive status [ER+]: Secondary | ICD-10-CM | POA: Insufficient documentation

## 2020-08-29 DIAGNOSIS — Z803 Family history of malignant neoplasm of breast: Secondary | ICD-10-CM | POA: Insufficient documentation

## 2020-08-29 DIAGNOSIS — Z79899 Other long term (current) drug therapy: Secondary | ICD-10-CM | POA: Insufficient documentation

## 2020-08-29 DIAGNOSIS — Z8249 Family history of ischemic heart disease and other diseases of the circulatory system: Secondary | ICD-10-CM | POA: Insufficient documentation

## 2020-08-29 DIAGNOSIS — Z9049 Acquired absence of other specified parts of digestive tract: Secondary | ICD-10-CM | POA: Insufficient documentation

## 2020-08-29 LAB — CMP (CANCER CENTER ONLY)
ALT: 14 U/L (ref 0–44)
AST: 16 U/L (ref 15–41)
Albumin: 4 g/dL (ref 3.5–5.0)
Alkaline Phosphatase: 42 U/L (ref 38–126)
Anion gap: 6 (ref 5–15)
BUN: 24 mg/dL — ABNORMAL HIGH (ref 8–23)
CO2: 29 mmol/L (ref 22–32)
Calcium: 10 mg/dL (ref 8.9–10.3)
Chloride: 106 mmol/L (ref 98–111)
Creatinine: 1.5 mg/dL — ABNORMAL HIGH (ref 0.44–1.00)
GFR, Estimated: 34 mL/min — ABNORMAL LOW (ref >60)
Glucose, Bld: 120 mg/dL — ABNORMAL HIGH (ref 70–99)
Potassium: 4.9 mmol/L (ref 3.5–5.1)
Sodium: 141 mmol/L (ref 135–145)
Total Bilirubin: 0.8 mg/dL (ref 0.3–1.2)
Total Protein: 7 g/dL (ref 6.5–8.1)

## 2020-08-29 LAB — CBC WITH DIFFERENTIAL (CANCER CENTER ONLY)
Abs Immature Granulocytes: 0.05 10*3/uL (ref 0.00–0.07)
Basophils Absolute: 0.1 10*3/uL (ref 0.0–0.1)
Basophils Relative: 1 %
Eosinophils Absolute: 0.4 10*3/uL (ref 0.0–0.5)
Eosinophils Relative: 3 %
HCT: 43.3 % (ref 36.0–46.0)
Hemoglobin: 14.2 g/dL (ref 12.0–15.0)
Immature Granulocytes: 0 %
Lymphocytes Relative: 46 %
Lymphs Abs: 6.4 10*3/uL — ABNORMAL HIGH (ref 0.7–4.0)
MCH: 30.5 pg (ref 26.0–34.0)
MCHC: 32.8 g/dL (ref 30.0–36.0)
MCV: 93.1 fL (ref 80.0–100.0)
Monocytes Absolute: 1 10*3/uL (ref 0.1–1.0)
Monocytes Relative: 7 %
Neutro Abs: 6 10*3/uL (ref 1.7–7.7)
Neutrophils Relative %: 43 %
Platelet Count: 241 10*3/uL (ref 150–400)
RBC: 4.65 MIL/uL (ref 3.87–5.11)
RDW: 12.8 % (ref 11.5–15.5)
WBC Count: 13.9 10*3/uL — ABNORMAL HIGH (ref 4.0–10.5)
nRBC: 0 % (ref 0.0–0.2)

## 2020-08-29 MED ORDER — ANASTROZOLE 1 MG PO TABS: 1.0000 mg | ORAL_TABLET | Freq: Every day | ORAL | 4 refills | Status: DC

## 2020-08-29 NOTE — Addendum Note (Signed)
Addended by: [name] on: 08/29/2020 05:55 PM   Modules accepted: Orders

## 2020-08-30 NOTE — Progress Notes (Signed)
[id number] [place], [place] - [address]. AT [address] [place] [address] Phone: [phone] Fax: [phone]  [facility] [place] [place] [facility] [address] [place] [address] Phone: [phone] Fax: [phone]      Your procedure is scheduled on February 3  Report to [facility] Main Entrance "A" at 0530 A.M., and check in at the Admitting office.  Call this number if you have problems the morning of surgery:  [phone]  Call [phone] if you have any questions prior to your surgery date Monday-Friday 8am-4pm    Remember:  Do not eat after midnight the night before your surgery  You may drink clear liquids until 0430 am the morning of your surgery.   Clear liquids allowed are: Water, Non-Citrus Juices (without pulp), Carbonated Beverages, Clear Tea, Black Coffee Only, and Gatorade    Take these medicines the morning of surgery with A SIP OF WATER  acetaminophen (TYLENOL) if needed amLODipine (NORVASC metoprolol tartrate (LOPRESSOR) PARoxetine (PAXIL) Eye drops if needed  Follow your surgeon's instructions on when to stop ELIQUIS.  If no instructions were given by your surgeon then you will need to call the office to get those instructions.    As of today, STOP taking any Aspirin (unless otherwise instructed by your surgeon) Aleve, Naproxen, Ibuprofen, Motrin, Advil, Goody's, BC's, all herbal medications, fish oil, and all vitamins.                      Do not wear jewelry, make up, or nail polish            Do not wear lotions, powders, perfumes/colognes, or deodorant.            Do not shave 48 hours prior to surgery.              Do not bring valuables to the hospital.            [facility] is not responsible for any belongings or valuables.  Do NOT Smoke (Tobacco/Vaping) or drink Alcohol 24 hours prior to your  procedure If you use a CPAP at night, you may bring all equipment for your overnight stay.   Contacts, glasses, dentures or bridgework may not be worn into surgery.      For patients admitted to the hospital, discharge time will be determined by your treatment team.   Patients discharged the day of surgery will not be allowed to drive home, and someone needs to stay with them for 24 hours.    Special instructions:   [facility]- Preparing For Surgery  Before surgery, you can play an important role. Because skin is not sterile, your skin needs to be as free of germs as possible. You can reduce the number of germs on your skin by washing with CHG (chlorahexidine gluconate) Soap before surgery.  CHG is an antiseptic cleaner which kills germs and bonds with the skin to continue killing germs even after washing.    Oral Hygiene is also important to reduce your risk of infection.  Remember - BRUSH YOUR TEETH THE MORNING OF SURGERY WITH YOUR REGULAR TOOTHPASTE  Please do not use if you have an allergy to CHG or antibacterial soaps. If your skin becomes reddened/irritated stop using the CHG.  Do not shave (including legs and underarms) for at least 48 hours prior to first CHG  shower. It is OK to shave your face.  Please follow these instructions carefully.   1. Shower the NIGHT BEFORE SURGERY and the MORNING OF SURGERY with CHG Soap.   2. If you chose to wash your hair, wash your hair first as usual with your normal shampoo.  3. After you shampoo, rinse your hair and body thoroughly to remove the shampoo.  4. Use CHG as you would any other liquid soap. You can apply CHG directly to the skin and wash gently with a scrungie or a clean washcloth.   5. Apply the CHG Soap to your body ONLY FROM THE NECK DOWN.  Do not use on open wounds or open sores. Avoid contact with your eyes, ears, mouth and genitals (private parts). Wash Face and genitals (private parts)  with your normal soap.   6. Wash  thoroughly, paying special attention to the area where your surgery will be performed.  7. Thoroughly rinse your body with warm water from the neck down.  8. DO NOT shower/wash with your normal soap after using and rinsing off the CHG Soap.  9. Pat yourself dry with a CLEAN TOWEL.  10. Wear CLEAN PAJAMAS to bed the night before surgery  11. Place CLEAN SHEETS on your bed the night of your first shower and DO NOT SLEEP WITH PETS.   Day of Surgery: Wear Clean/Comfortable clothing the morning of surgery Do not apply any deodorants/lotions.   Remember to brush your teeth WITH YOUR REGULAR TOOTHPASTE.   Please read over the following fact sheets that you were given.

## 2020-08-31 ENCOUNTER — Encounter (HOSPITAL_COMMUNITY)
Admission: RE | Admit: 2020-08-31 | Discharge: 2020-08-31 | Disposition: A | Discharge status: Home / Self Care | Payer: Medicare HMO | Source: Ambulatory Visit | Attending: General Surgery | Admitting: General Surgery

## 2020-08-31 ENCOUNTER — Encounter (HOSPITAL_COMMUNITY): Payer: Self-pay

## 2020-08-31 ENCOUNTER — Other Ambulatory Visit: Payer: Self-pay | Admitting: General Surgery

## 2020-08-31 ENCOUNTER — Telehealth: Payer: Self-pay | Admitting: Oncology

## 2020-08-31 ENCOUNTER — Other Ambulatory Visit: Payer: Self-pay

## 2020-08-31 DIAGNOSIS — Z170 Estrogen receptor positive status [ER+]: Secondary | ICD-10-CM

## 2020-08-31 DIAGNOSIS — C50212 Malignant neoplasm of upper-inner quadrant of left female breast: Secondary | ICD-10-CM

## 2020-08-31 NOTE — Telephone Encounter (Signed)
Left message with follow-up appointment per 1/26 los. Gave option to call back to reschedule if needed.

## 2020-08-31 NOTE — Progress Notes (Signed)
PCP - [name] Cardiologist - Fransico Him, MD Radiation Oncology: Lenward Chancellor, MD  PPM/ICD - denies   Chest x-ray - n/a EKG - 02/22/20 Stress Test - 01/20/12 ECHO - 04/29/17 Cardiac Cath - denies  Sleep Study - denies  Patient instructed to hold all Aspirin, NSAID's, herbal medications, fish oil and vitamins 7 days prior to surgery.   Instructed patient to call Dr. Radford Pax for further instruction on eliquis. Magrinat had told her to not take day of surgery but continue until then.   ERAS Protcol -yes   COVID TEST-  09/03/20   Anesthesia review: yes, history of afibb on last EKG  Patient denies shortness of breath, fever, cough and chest pain at PAT appointment   All instructions explained to the patient, with a verbal understanding of the material. Patient agrees to go over the instructions while at home for a better understanding. Patient also instructed to self quarantine after being tested for COVID-19. The opportunity to ask questions was provided.

## 2020-09-03 ENCOUNTER — Other Ambulatory Visit (HOSPITAL_COMMUNITY)
Admission: RE | Admit: 2020-09-03 | Discharge: 2020-09-03 | Disposition: A | Discharge status: Home / Self Care | Payer: Medicare HMO | Source: Ambulatory Visit | Attending: General Surgery | Admitting: General Surgery

## 2020-09-03 DIAGNOSIS — Z20822 Contact with and (suspected) exposure to covid-19: Secondary | ICD-10-CM | POA: Insufficient documentation

## 2020-09-03 DIAGNOSIS — Z01812 Encounter for preprocedural laboratory examination: Secondary | ICD-10-CM | POA: Insufficient documentation

## 2020-09-03 LAB — SARS CORONAVIRUS 2 (TAT 6-24 HRS): SARS Coronavirus 2: NEGATIVE

## 2020-09-03 NOTE — Progress Notes (Signed)
Anesthesia Chart Review:  Follows with cardiology for history of permanent atrial fibrillation.  Last seen by Dr. Radford Pax 08/22/2020, heart rate was adequately controlled at that time.  She is anticoagulated on Eliquis.  Preoperative clearance was also addressed.  Per note, "-she has breast Ca and is getting a lumpectomy -She can achieve 6.6 mets on the Duke Activity Index with no problems and therefore no further ischemic workup is indicated at this time -By the revised Cardiac Risk index she has a 0.4% perioperative risk of major cardiac event."  Per surgery posting, Eliquis to be held 2 days prior to surgery.  Patient was unclear on these instructions at preadmission testing appointment. I spoke with surgeon's office and they will reach out to patient to make sure she understands when to stop.  Preop labs reviewed, creatinine mildly elevated 1.50.  Per review of previous labs her baseline creatinine is around 1.27.  Remainder of labs unremarkable.  EKG 02/22/2020: A. fib.  Ventricular rate 68.  TTE 04/29/2017: - Left ventricle: The cavity size was normal. Systolic function was  normal. The estimated ejection fraction was in the range of 60%  to 65%. Wall motion was normal; there were no regional wall  motion abnormalities. The study is not technically sufficient to  allow evaluation of LV diastolic function.  - Aortic valve: There was mild regurgitation.  - Mitral valve: Calcified annulus. Mildly thickened leaflets .  - Left atrium: The atrium was moderately dilated.  - Right atrium: The atrium was moderately dilated.  - Atrial septum: No defect or patent foramen ovale was identified.  - Pulmonary arteries: PA peak pressure: 33 mm Hg (S).     Wynonia Musty [facility] Short Stay Center/Anesthesiology Phone [phone] 09/03/2020 2:23 PM

## 2020-09-03 NOTE — Anesthesia Preprocedure Evaluation (Addendum)
Anesthesia Evaluation  Patient identified by MRN, date of birth, ID band Patient awake    Reviewed: Allergy & Precautions, NPO status , Patient's Chart, lab work & pertinent test results  History of Anesthesia Complications (+) PONV and history of anesthetic complications  Airway Mallampati: II  TM Distance: >3 FB Neck ROM: Full    Dental  (+) Dental Advisory Given, Teeth Intact   Pulmonary neg pulmonary ROS, former smoker,    Pulmonary exam normal breath sounds clear to auscultation       Cardiovascular hypertension, Pt. on home beta blockers and Pt. on medications Normal cardiovascular exam+ dysrhythmias  Rhythm:Regular Rate:Normal     Neuro/Psych negative neurological ROS     GI/Hepatic Neg liver ROS, PUD, GERD  ,  Endo/Other  negative endocrine ROS  Renal/GU negative Renal ROS     Musculoskeletal  (+) Arthritis ,   Abdominal   Peds  Hematology negative hematology ROS (+)   Anesthesia Other Findings   Reproductive/Obstetrics                           Anesthesia Physical Anesthesia Plan  ASA: III  Anesthesia Plan: General   Post-op Pain Management:    Induction: Intravenous  PONV Risk Score and Plan: 4 or greater and Ondansetron, Aprepitant, Dexamethasone, Treatment may vary due to age or medical condition and Diphenhydramine  Airway Management Planned: LMA  Additional Equipment: None  Intra-op Plan:   Post-operative Plan: Extubation in OR  Informed Consent: I have reviewed the patients History and Physical, chart, labs and discussed the procedure including the risks, benefits and alternatives for the proposed anesthesia with the patient or authorized representative who has indicated his/her understanding and acceptance.     Dental advisory given  Plan Discussed with: CRNA  Anesthesia Plan Comments: (PAT note by Karoline Caldwell, PA-C: Follows with cardiology for history  of permanent atrial fibrillation.  Last seen by Dr. Radford Pax 08/22/2020, heart rate was adequately controlled at that time.  She is anticoagulated on Eliquis.  Preoperative clearance was also addressed.  Per note, "-she has breast Ca and is getting a lumpectomy -She can achieve 6.6 mets on the Duke Activity Index with no problems and therefore no further ischemic workup is indicated at this time -By the revised Cardiac Risk index she has a 0.4% perioperative risk of major cardiac event."  Per surgery posting, Eliquis to be held 2 days prior to surgery.  Patient was unclear on these instructions at preadmission testing appointment. I spoke with surgeon's office and they will reach out to patient to make sure she understands when to stop.  Preop labs reviewed, creatinine mildly elevated 1.50.  Per review of previous labs her baseline creatinine is around 1.27.  Remainder of labs unremarkable.  EKG 02/22/2020: A. fib.  Ventricular rate 68.  TTE 04/29/2017: - Left ventricle: The cavity size was normal. Systolic function was  normal. The estimated ejection fraction was in the range of 60%  to 65%. Wall motion was normal; there were no regional wall  motion abnormalities. The study is not technically sufficient to  allow evaluation of LV diastolic function.  - Aortic valve: There was mild regurgitation.  - Mitral valve: Calcified annulus. Mildly thickened leaflets .  - Left atrium: The atrium was moderately dilated.  - Right atrium: The atrium was moderately dilated.  - Atrial septum: No defect or patent foramen ovale was identified.  - Pulmonary arteries: PA peak pressure: 33  mm Hg (S). )      Anesthesia Quick Evaluation

## 2020-09-04 ENCOUNTER — Telehealth: Payer: Self-pay | Admitting: Oncology

## 2020-09-04 HISTORY — PX: BREAST LUMPECTOMY: SHX2

## 2020-09-04 NOTE — Telephone Encounter (Signed)
Scheduled genetics appts per 1/27 email from Roma Kayser. Pt confirmed new arrival time.

## 2020-09-05 ENCOUNTER — Ambulatory Visit
Admission: RE | Admit: 2020-09-05 | Discharge: 2020-09-05 | Disposition: A | Discharge status: Home / Self Care | Payer: Medicare HMO | Source: Ambulatory Visit | Attending: General Surgery | Admitting: General Surgery

## 2020-09-05 ENCOUNTER — Other Ambulatory Visit: Payer: Self-pay | Admitting: General Surgery

## 2020-09-05 ENCOUNTER — Other Ambulatory Visit: Payer: Self-pay

## 2020-09-05 DIAGNOSIS — C50212 Malignant neoplasm of upper-inner quadrant of left female breast: Secondary | ICD-10-CM

## 2020-09-05 DIAGNOSIS — Z170 Estrogen receptor positive status [ER+]: Secondary | ICD-10-CM

## 2020-09-05 NOTE — H&P (Signed)
Mallory Stephens Appointment: 08/13/2020 3:00 PM Location: [facility] Patient #: [id number] DOB: 02/08/1933 Widowed / Language: English / Race: White Female   History of Present Illness (  MD; 08/13/2020 4:13 PM) The patient is a 87 year old female who presents with breast cancer. Pt is an 87 yo F with a new diagnosis of left breast cancer 07/2020. She was found to have screening detected architectural distortion wtih possible asymmetry. Diagnostic imaging confirmed this and showed an irregular mass at 11 o'clock 1.6 cm in greatest dimension. Left axilla was negative. 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 ER/PR were positive with strong intensity. Her 2 negative, Ki 67 10%.   She has no personal cancer history before this current diagnosis, but has had bilateral breast surgeries for benign disease and multiple cyst aspirations in the past. She has had two brothers with prostate cancer and one sister with breast cancer. She was 18 at menarche and is a G0P0.   She is on eliquis for atrial fibrillation, but has no history of ischemic heart disease or CHF. She sees Dr. Turner of cardiology for this.   diagnostic mammo/us 07/23/2020 ACR Breast Density Category c: The breast tissue is heterogeneously dense, which may obscure small masses.  FINDINGS: Tomosynthesis and synthesized spot-compression CC and MLO views of the area of concern in the LEFT breast and a tomosynthesis and synthesized full field mediolateral view of the LEFT breast were obtained. The full field mediolateral was processed with CAD.  The asymmetry in the UPPER INNER QUADRANT at MIDDLE depth persists on the spot compression images and there is persistent architectural distortion. No suspicious findings elsewhere on the full field mediolateral images.  Targeted ultrasound is performed, showing an irregular  hypoechoic mass with vague margins at the 11 o'clock position approximately 4 cm from the nipple measuring approximately 1.6 x 0.8 x 1.3 cm, associated with architectural distortion, demonstrating posterior acoustic shadowing and demonstrating peripheral power Doppler flow, corresponding to the screening mammographic finding.  Sonographic evaluation of the LEFT axilla demonstrates no pathologic lymphadenopathy.  On correlative physical examination, there is palpable thickening involving the UPPER INNER QUADRANT of the LEFT breast. The patient's surgical scar from the prior excisional biopsy is in the UPPER OUTER QUADRANT, so the current finding does not correlate with the prior surgical scar.  IMPRESSION: 1. Highly suspicious 1.6 cm mass involving the UPPER INNER QUADRANT of the LEFT breast at the 11 o'clock position approximately 4 cm from the nipple. 2. No pathologic LEFT axillary lymphadenopathy.  RECOMMENDATION: Ultrasound-guided core needle biopsy of the LEFT breast mass.  The ultrasound core needle biopsy procedure was discussed with the patient and her questions were answered. She wishes to proceed and the biopsy has been scheduled at her convenience.  I have discussed the findings and recommendations with the patient.  BI-RADS CATEGORY 5: Highly suggestive of malignancy.  pathology 07/24/2020 Breast, left, needle core biopsy, 11 o'clock, 4 cmfn - INVASIVE MAMMARY CARCINOMA - MAMMARY CARCINOMA IN SITU - SEE COMMENT E-cadherin is POSITIVE supporting a ductal origin. Estrogen Receptor: 95%, POSITIVE, STRONG STAINING INTENSITY Progesterone Receptor: 70%, POSITIVE, STRONG STAINING INTENSITY Proliferation Marker Ki67: 10% GROUP 5: HER2 **NEGATIVE** Microscopic Comment The biopsy material shows an infiltrative proliferation of cells with large vesicular nuclei with inconspicuous nucleoli, arranged linearly and in small clusters. Based on the biopsy, the carcinoma  appears Nottingham grade 1-2 of 3 and measures 1.5 cm in greatest linear extent.      Past Surgical History Hortencia Conradi, CMA; 08/13/2020 2:58 PM) Breast Biopsy  Bilateral. Cataract Surgery  Bilateral. Colon Polyp Removal - Colonoscopy  Gallbladder Surgery - Laparoscopic  Knee Surgery  Bilateral.  Allergies (Kheana Marshall-McBride, CMA; 08/13/2020 2:59 PM) No Known Drug Allergies  [08/13/2020]: No Known Allergies  [08/13/2020]: Allergies Reconciled   Medication History (Kheana Marshall-McBride, CMA; 08/13/2020 3:00 PM) amLODIPine Besylate (5MG Tablet, Oral) Active. Famotidine (40MG Tablet, Oral) Active. Losartan Potassium (100MG Tablet, Oral) Active. Metoprolol Tartrate (25MG Tablet, Oral) Active. Pravastatin Sodium (20MG Tablet, Oral) Active. Eliquis (5MG Tablet, Oral) Active. PARoxetine HCl (10MG Tablet, Oral) Active. Solifenacin Succinate (5MG Tablet, Oral) Active. Medications Reconciled  Social History Hortencia Conradi, CMA; 08/13/2020 2:58 PM) Alcohol use  Moderate alcohol use. Caffeine use  Carbonated beverages, Coffee, Tea. No drug use  Tobacco use  Former smoker.  Family History Hortencia Conradi, CMA; 08/13/2020 2:58 PM) Breast Cancer  Sister. Cerebrovascular Accident  Mother. Hypertension  Brother, Father, Mother, Sister. Prostate Cancer  Brother.  Pregnancy / Birth History Hortencia Conradi, CMA; 08/13/2020 2:58 PM) Age at menarche  63 years. Maternal age  46-30 Para  0  Other Problems Hortencia Conradi, CMA; 08/13/2020 2:58 PM) Atrial Fibrillation  Cholelithiasis  Gastroesophageal Reflux Disease  High blood pressure     Review of Systems [name] MD; 08/13/2020 4:14 PM) Female Genitourinary Present- Urgency. Not Present- Frequency, Nocturia, Painful Urination and Pelvic Pain. Hematology Present- Blood Thinners and Easy Bruising. Not Present- Excessive bleeding, Gland problems, HIV  and Persistent Infections. All other systems negative  Vitals (Kheana Marshall-McBride CMA; 08/13/2020 3:00 PM) 08/13/2020 3:00 PM Weight: 152.25 lb Height: 65in Body Surface Area: 1.76 m Body Mass Index: 25.34 kg/m  Temp.: 97.36F  Pulse: 83 (Regular)  P.OX: 93% (Room air) BP: 128/64(Sitting, Left Arm, Standard)       Physical Exam [name] MD; 08/13/2020 4:17 PM) General Mental Status-Alert. General Appearance-Consistent with stated age. Hydration-Well hydrated. Voice-Normal.  Head and Neck Head-normocephalic, atraumatic with no lesions or palpable masses. Trachea-midline. Thyroid Gland Characteristics - normal size and consistency.  Eye Eyeball - Bilateral-Extraocular movements intact. Sclera/Conjunctiva - Bilateral-No scleral icterus.  Chest and Lung Exam Chest and lung exam reveals -quiet, even and easy respiratory effort with no use of accessory muscles and on auscultation, normal breath sounds, no adventitious sounds and normal vocal resonance. Inspection Chest Wall - Normal. Back - normal.  Breast Note: breasts are symmetric bilaterally. they do not feel as dense as it appears on mammogram. She has a superolateral circumlinear scar on the left. There is no palpable mass, no nipple retraction or nipple discharge. She has no skin dimpling. The upper breast is slightly less full, but the lateral right breast has a similar scar, just more laterally located. It is a bit less full in this location. There is no LAD.   Cardiovascular Cardiovascular examination reveals -normal pedal pulses bilaterally. Note: irregularly irregular, no murmur auscultated.  Abdomen Inspection Inspection of the abdomen reveals - No Hernias. Palpation/Percussion Palpation and Percussion of the abdomen reveal - Soft, Non Tender, No Rebound tenderness, No Rigidity (guarding) and No hepatosplenomegaly. Auscultation Auscultation of the abdomen reveals -  Bowel sounds normal.  Neurologic Neurologic evaluation reveals -alert and oriented x 3 with no impairment of recent or remote memory. Mental Status-Normal.  Musculoskeletal Global Assessment -Note: no gross deformities.  Normal Exam - Left-Upper Extremity Strength Normal and Lower Extremity Strength Normal. Normal Exam - Right-Upper Extremity Strength Normal and Lower Extremity Strength Normal.  Lymphatic Head & Neck  General Head & Neck Lymphatics: Bilateral - Description - Normal. Axillary  General Axillary Region: Bilateral - Description - Normal. Tenderness - Non Tender. Femoral & Inguinal  Generalized Femoral & Inguinal Lymphatics: Bilateral - Description - No Generalized lymphadenopathy.    Assessment & Plan [name] MD; 08/13/2020 4:20 PM) MALIGNANT NEOPLASM OF UPPER-INNER QUADRANT OF LEFT BREAST IN FEMALE, ESTROGEN RECEPTOR POSITIVE (C50.212) Impression: Pt has a new diagnosis of cT1cN0 left breast cancer. She is accompanied by her niece. She has post op nausea and therefore, I will do a scopolamine patch pre op.  Given her age and low grade of tumor, we will just do seed localized lumpectomy. I will refer her to medical and radiation oncology post op. She does not have any offspring, but she may benefit from genetics. Will discuss this post op as well.  The surgical procedure was described to the patient. I discussed the incision type and location and that we would need radiology involved on with a wire or seed marker and/or sentinel node.  The risks and benefits of the procedure were described to the patient and she wishes to proceed.  We discussed the risks bleeding, infection, damage to other structures, need for further procedures/surgeries. We discussed the risk of seroma. The patient was advised if the area in the breast in cancer, we may need to go back to surgery for additional tissue to obtain negative margins or for a lymph node biopsy. The patient  was advised that these are the most common complications, but that others can occur as well. They were advised against taking aspirin or other anti-inflammatory agents/blood thinners the week before surgery. Current Plans Referred to Oncology, for evaluation and follow up (Oncology). Routine. Referred to Radiation Oncology, for evaluation and follow up (Radiation Oncology). Routine. You are being scheduled for surgery- Our schedulers will call you.  You should hear from our office's scheduling department within 5 working days about the location, date, and time of surgery. We try to make accommodations for patient's preferences in scheduling surgery, but sometimes the OR schedule or the surgeon's schedule prevents [place] from making those accommodations.  If you have not heard from our office [phone]) in 5 working days, call the office and ask for your surgeon's nurse.  If you have other questions about your diagnosis, plan, or surgery, call the office and ask for your surgeon's nurse.  Pt Education - [facility] breast cancer surgery: discussed with patient and provided information. POST-OPERATIVE NAUSEA AND VOMITING (R11.2) ATRIAL FIBRILLATION (I48.91) Impression: I have communicated with Dr. Radford Pax holding eliquis pre op.  Prior echos have showed preserved EF. ON CONTINUOUS ORAL ANTICOAGULATION (Z79.01) Impression: See above.    Signed electronically by [name], MD (08/13/2020 4:21 PM)

## 2020-09-06 ENCOUNTER — Ambulatory Visit (HOSPITAL_COMMUNITY)
Admission: RE | Admit: 2020-09-06 | Discharge: 2020-09-06 | Disposition: A | Discharge status: Home / Self Care | Payer: Medicare HMO | Attending: General Surgery | Admitting: General Surgery

## 2020-09-06 ENCOUNTER — Encounter (HOSPITAL_COMMUNITY)
Admission: RE | Disposition: A | Discharge status: Home / Self Care | Payer: Self-pay | Source: Home / Self Care | Attending: General Surgery

## 2020-09-06 ENCOUNTER — Ambulatory Visit (HOSPITAL_COMMUNITY): Payer: Medicare HMO | Admitting: Physician Assistant

## 2020-09-06 ENCOUNTER — Ambulatory Visit
Admission: RE | Admit: 2020-09-06 | Discharge: 2020-09-06 | Disposition: A | Discharge status: Home / Self Care | Payer: Medicare HMO | Source: Ambulatory Visit | Attending: General Surgery | Admitting: General Surgery

## 2020-09-06 ENCOUNTER — Encounter (HOSPITAL_COMMUNITY): Payer: Self-pay | Admitting: General Surgery

## 2020-09-06 ENCOUNTER — Ambulatory Visit (HOSPITAL_COMMUNITY): Payer: Medicare HMO | Admitting: Anesthesiology

## 2020-09-06 DIAGNOSIS — I4891 Unspecified atrial fibrillation: Secondary | ICD-10-CM | POA: Diagnosis not present

## 2020-09-06 DIAGNOSIS — Z7901 Long term (current) use of anticoagulants: Secondary | ICD-10-CM | POA: Diagnosis not present

## 2020-09-06 DIAGNOSIS — Z8042 Family history of malignant neoplasm of prostate: Secondary | ICD-10-CM | POA: Diagnosis not present

## 2020-09-06 DIAGNOSIS — Z803 Family history of malignant neoplasm of breast: Secondary | ICD-10-CM | POA: Diagnosis not present

## 2020-09-06 DIAGNOSIS — C50212 Malignant neoplasm of upper-inner quadrant of left female breast: Secondary | ICD-10-CM

## 2020-09-06 DIAGNOSIS — C50912 Malignant neoplasm of unspecified site of left female breast: Secondary | ICD-10-CM | POA: Diagnosis present

## 2020-09-06 DIAGNOSIS — Z79899 Other long term (current) drug therapy: Secondary | ICD-10-CM | POA: Insufficient documentation

## 2020-09-06 DIAGNOSIS — Z170 Estrogen receptor positive status [ER+]: Secondary | ICD-10-CM | POA: Insufficient documentation

## 2020-09-06 DIAGNOSIS — Z87891 Personal history of nicotine dependence: Secondary | ICD-10-CM | POA: Insufficient documentation

## 2020-09-06 HISTORY — PX: BREAST LUMPECTOMY WITH RADIOACTIVE SEED LOCALIZATION: SHX6424

## 2020-09-06 SURGERY — BREAST LUMPECTOMY WITH RADIOACTIVE SEED LOCALIZATION
Anesthesia: General | Site: Breast | Laterality: Left

## 2020-09-06 MED ORDER — ONDANSETRON HCL 4 MG/2ML IJ SOLN
INTRAMUSCULAR | Status: DC | PRN
  Administered 2020-09-06: 4 mg via INTRAVENOUS

## 2020-09-06 MED ORDER — SCOPOLAMINE 1 MG/3DAYS TD PT72
1.0000 | MEDICATED_PATCH | TRANSDERMAL | Status: DC
  Administered 2020-09-06: 1.5 mg via TRANSDERMAL
  Filled 2020-09-06: qty 1

## 2020-09-06 MED ORDER — LIDOCAINE 2% (20 MG/ML) 5 ML SYRINGE
INTRAMUSCULAR | Status: DC | PRN
  Administered 2020-09-06: 80 mg via INTRAVENOUS

## 2020-09-06 MED ORDER — MEPERIDINE HCL 25 MG/ML IJ SOLN: 6.2500 mg | INTRAMUSCULAR | Status: DC | PRN

## 2020-09-06 MED ORDER — FENTANYL CITRATE (PF) 100 MCG/2ML IJ SOLN: 25.0000 ug | INTRAMUSCULAR | Status: DC | PRN

## 2020-09-06 MED ORDER — OXYCODONE HCL 5 MG/5ML PO SOLN: 5.0000 mg | Freq: Once | ORAL | Status: DC | PRN

## 2020-09-06 MED ORDER — AMISULPRIDE (ANTIEMETIC) 5 MG/2ML IV SOLN: 10.0000 mg | Freq: Once | INTRAVENOUS | Status: DC | PRN

## 2020-09-06 MED ORDER — PROPOFOL 10 MG/ML IV BOLUS
INTRAVENOUS | Status: DC | PRN
  Administered 2020-09-06: 110 mg via INTRAVENOUS

## 2020-09-06 MED ORDER — ONDANSETRON HCL 4 MG/2ML IJ SOLN
INTRAMUSCULAR | Status: AC
  Filled 2020-09-06: qty 2

## 2020-09-06 MED ORDER — OXYCODONE HCL 5 MG PO TABS: 5.0000 mg | ORAL_TABLET | Freq: Once | ORAL | Status: DC | PRN

## 2020-09-06 MED ORDER — FENTANYL CITRATE (PF) 250 MCG/5ML IJ SOLN
INTRAMUSCULAR | Status: AC
  Filled 2020-09-06: qty 5

## 2020-09-06 MED ORDER — APREPITANT 40 MG PO CAPS
40.0000 mg | ORAL_CAPSULE | Freq: Once | ORAL | Status: AC
  Administered 2020-09-06: 40 mg via ORAL
  Filled 2020-09-06: qty 1

## 2020-09-06 MED ORDER — DEXAMETHASONE SODIUM PHOSPHATE 10 MG/ML IJ SOLN
INTRAMUSCULAR | Status: DC | PRN
  Administered 2020-09-06: 4 mg via INTRAVENOUS

## 2020-09-06 MED ORDER — LIDOCAINE HCL (PF) 1 % IJ SOLN
INTRAMUSCULAR | Status: AC
  Filled 2020-09-06: qty 30

## 2020-09-06 MED ORDER — FENTANYL CITRATE (PF) 250 MCG/5ML IJ SOLN
INTRAMUSCULAR | Status: DC | PRN
  Administered 2020-09-06: 25 ug via INTRAVENOUS
  Administered 2020-09-06: 50 ug via INTRAVENOUS

## 2020-09-06 MED ORDER — LIDOCAINE HCL 1 % IJ SOLN
INTRAMUSCULAR | Status: DC | PRN
  Administered 2020-09-06: 40 mL via INTRAMUSCULAR

## 2020-09-06 MED ORDER — TRAMADOL HCL 50 MG PO TABS: 50.0000 mg | ORAL_TABLET | Freq: Four times a day (QID) | ORAL | 0 refills | Status: DC | PRN

## 2020-09-06 MED ORDER — CHLORHEXIDINE GLUCONATE CLOTH 2 % EX PADS: 6.0000 | MEDICATED_PAD | Freq: Once | CUTANEOUS | Status: DC

## 2020-09-06 MED ORDER — PHENYLEPHRINE HCL-NACL 10-0.9 MG/250ML-% IV SOLN
INTRAVENOUS | Status: DC | PRN
  Administered 2020-09-06: 50 ug/min via INTRAVENOUS

## 2020-09-06 MED ORDER — CHLORHEXIDINE GLUCONATE 0.12 % MT SOLN
OROMUCOSAL | Status: AC
  Administered 2020-09-06: 15 mL
  Filled 2020-09-06: qty 15

## 2020-09-06 MED ORDER — LACTATED RINGERS IV SOLN: INTRAVENOUS | Status: DC | PRN

## 2020-09-06 MED ORDER — ACETAMINOPHEN 500 MG PO TABS
1000.0000 mg | ORAL_TABLET | ORAL | Status: AC
  Administered 2020-09-06: 1000 mg via ORAL
  Filled 2020-09-06: qty 2

## 2020-09-06 MED ORDER — DEXAMETHASONE SODIUM PHOSPHATE 10 MG/ML IJ SOLN
INTRAMUSCULAR | Status: AC
  Filled 2020-09-06: qty 1

## 2020-09-06 MED ORDER — CEFAZOLIN SODIUM-DEXTROSE 2-4 GM/100ML-% IV SOLN
2.0000 g | INTRAVENOUS | Status: AC
  Administered 2020-09-06: 2 g via INTRAVENOUS
  Filled 2020-09-06: qty 100

## 2020-09-06 MED ORDER — BUPIVACAINE-EPINEPHRINE (PF) 0.25% -1:200000 IJ SOLN
INTRAMUSCULAR | Status: AC
  Filled 2020-09-06: qty 20

## 2020-09-06 MED ORDER — PROPOFOL 10 MG/ML IV BOLUS
INTRAVENOUS | Status: AC
  Filled 2020-09-06: qty 20

## 2020-09-06 SURGICAL SUPPLY — 41 items
BINDER BREAST LRG (GAUZE/BANDAGES/DRESSINGS) ×2 IMPLANT
BINDER BREAST XLRG (GAUZE/BANDAGES/DRESSINGS) IMPLANT
BLADE SURG 10 STRL SS (BLADE) ×2 IMPLANT
CANISTER SUCT 3000ML PPV (MISCELLANEOUS) ×2 IMPLANT
CHLORAPREP W/TINT 26 (MISCELLANEOUS) ×2 IMPLANT
CLIP VESOCCLUDE LG 6/CT (CLIP) ×2 IMPLANT
CLIP VESOCCLUDE MED 6/CT (CLIP) ×2 IMPLANT
COVER PROBE W GEL 5X96 (DRAPES) ×2 IMPLANT
COVER SURGICAL LIGHT HANDLE (MISCELLANEOUS) ×2 IMPLANT
COVER WAND RF STERILE (DRAPES) ×2 IMPLANT
DERMABOND ADVANCED (GAUZE/BANDAGES/DRESSINGS) ×1
DERMABOND ADVANCED .7 DNX12 (GAUZE/BANDAGES/DRESSINGS) ×1 IMPLANT
DEVICE DUBIN SPECIMEN MAMMOGRA (MISCELLANEOUS) ×2 IMPLANT
DRAPE CHEST BREAST 15X10 FENES (DRAPES) ×2 IMPLANT
DRSG PAD ABDOMINAL 8X10 ST (GAUZE/BANDAGES/DRESSINGS) ×2 IMPLANT
ELECT COATED BLADE 2.86 ST (ELECTRODE) ×2 IMPLANT
ELECT REM PT RETURN 9FT ADLT (ELECTROSURGICAL) ×2
ELECTRODE REM PT RTRN 9FT ADLT (ELECTROSURGICAL) ×1 IMPLANT
GAUZE SPONGE 4X4 12PLY STRL (GAUZE/BANDAGES/DRESSINGS) ×2 IMPLANT
GAUZE SPONGE 4X4 12PLY STRL LF (GAUZE/BANDAGES/DRESSINGS) ×2 IMPLANT
GLOVE BIO SURGEON STRL SZ 6 (GLOVE) ×2 IMPLANT
GLOVE SURG UNDER LTX SZ6.5 (GLOVE) ×2 IMPLANT
GOWN STRL REUS W/ TWL LRG LVL3 (GOWN DISPOSABLE) ×1 IMPLANT
GOWN STRL REUS W/TWL 2XL LVL3 (GOWN DISPOSABLE) ×2 IMPLANT
GOWN STRL REUS W/TWL LRG LVL3 (GOWN DISPOSABLE) ×2
KIT BASIN OR (CUSTOM PROCEDURE TRAY) ×2 IMPLANT
KIT MARKER MARGIN INK (KITS) ×2 IMPLANT
LIGHT WAVEGUIDE WIDE FLAT (MISCELLANEOUS) ×2 IMPLANT
NEEDLE HYPO 25GX1X1/2 BEV (NEEDLE) ×2 IMPLANT
NS IRRIG 1000ML POUR BTL (IV SOLUTION) IMPLANT
PACK GENERAL/GYN (CUSTOM PROCEDURE TRAY) ×2 IMPLANT
STRIP CLOSURE SKIN 1/2X4 (GAUZE/BANDAGES/DRESSINGS) IMPLANT
SUT MNCRL AB 4-0 PS2 18 (SUTURE) ×2 IMPLANT
SUT SILK 2 0 SH (SUTURE) IMPLANT
SUT VIC AB 2-0 SH 27 (SUTURE) ×2
SUT VIC AB 2-0 SH 27XBRD (SUTURE) ×1 IMPLANT
SUT VIC AB 3-0 SH 27 (SUTURE) ×2
SUT VIC AB 3-0 SH 27X BRD (SUTURE) ×1 IMPLANT
SYR CONTROL 10ML LL (SYRINGE) ×2 IMPLANT
TOWEL GREEN STERILE (TOWEL DISPOSABLE) ×2 IMPLANT
TOWEL GREEN STERILE FF (TOWEL DISPOSABLE) ×2 IMPLANT

## 2020-09-06 NOTE — Op Note (Signed)
Left Breast Radioactive seed localized lumpectomy  Indications: This patient presents with history of left breast cancer, upper inner quadrant, cT1cN0,  Grade 1-2 invasive ductal carcinoma with DCIS, receptors +/+/-  Pre-operative Diagnosis: left breast cancer  Post-operative Diagnosis: Same  Surgeon: [name]   Assistant:  Judyann Munson, RNFA  Anesthesia: General endotracheal anesthesia  ASA Class: 3  Procedure Details  The patient was seen in the Holding Room. The risks, benefits, complications, treatment options, and expected outcomes were discussed with the patient. The possibilities of bleeding, infection, the need for additional procedures, failure to diagnose a condition, and creating a complication requiring other procedures or operations were discussed with the patient. The patient concurred with the proposed plan, giving informed consent.  The site of surgery properly noted/marked. The patient was taken to Operating Room # 2, identified, and the procedure verified as left breast seed localized lumpectomy.  The left breast and chest were prepped and draped in standard fashion. Her previous superior left breast incision was extended medially near the previously placed radioactive seed.  Dissection was carried down around the point of maximum signal intensity. The cautery was used to perform the dissection.   The specimen was inked with the margin marker paint kit.    Specimen radiography confirmed inclusion of the mammographic lesion, the clip, and the seed.  The background signal in the breast was zero.  Hemostasis was achieved with cautery.  The cavity was marked with clips on each border other than the anterior border.  The wound was irrigated and closed with 3-0 vicryl interrupted deep dermal sutures and 4-0 monocryl running subcuticular suture.      Sterile dressings were applied. At the end of the operation, all sponge, instrument, and needle counts were correct.    Findings: Seed, clip in specimen.  Posterior margin is pectoralis   Estimated Blood Loss:  min         Specimens: left breast tissue with seed         Complications:  None; patient tolerated the procedure well.         Disposition: PACU - hemodynamically stable.         Condition: stable

## 2020-09-06 NOTE — Anesthesia Postprocedure Evaluation (Signed)
Anesthesia Post Note  Patient: Mallory Stephens  Procedure(s) Performed: LEFT BREAST LUMPECTOMY WITH RADIOACTIVE SEED LOCALIZATION (Left Breast)     Patient location during evaluation: PACU Anesthesia Type: General Level of consciousness: sedated and patient cooperative Pain management: pain level controlled Vital Signs Assessment: post-procedure vital signs reviewed and stable Respiratory status: spontaneous breathing Cardiovascular status: stable Anesthetic complications: no   No complications documented.  Last Vitals:  Vitals:   09/06/20 1019 09/06/20 1020  BP: (!) 132/53   Pulse: 82 78  Resp: 13 14  Temp:  (!) 36.3 C  SpO2: 94% 92%    Last Pain:  Vitals:   09/06/20 1020  TempSrc:   PainSc: 0-No pain                 Nolon Nations

## 2020-09-06 NOTE — Discharge Instructions (Addendum)
Ok to start [employer].  [place] Office Phone Number [phone]  BREAST BIOPSY/ PARTIAL MASTECTOMY: POST OP INSTRUCTIONS  Always review your discharge instruction sheet given to you by the facility where your surgery was performed.  IF YOU HAVE DISABILITY OR FAMILY LEAVE FORMS, YOU MUST BRING THEM TO THE OFFICE FOR PROCESSING.  DO NOT GIVE THEM TO YOUR DOCTOR.  1. A prescription for pain medication may be given to you upon discharge.  Take your pain medication as prescribed, if needed.  If narcotic pain medicine is not needed, then you may take acetaminophen (Tylenol) or ibuprofen (Advil) as needed. 2. Take your usually prescribed medications unless otherwise directed 3. If you need a refill on your pain medication, please contact your pharmacy.  They will contact our office to request authorization.  Prescriptions will not be filled after 5pm or on week-ends. 4. You should eat very light the first 24 hours after surgery, such as soup, crackers, pudding, etc.  Resume your normal diet the day after surgery. 5. Most patients will experience some swelling and bruising in the breast.  Ice packs and a good support bra will help.  Swelling and bruising can take several days to resolve.  6. It is common to experience some constipation if taking pain medication after surgery.  Increasing fluid intake and taking a stool softener will usually help or prevent this problem from occurring.  A mild laxative (Milk of Magnesia or Miralax) should be taken according to package directions if there are no bowel movements after 48 hours. 7. Unless discharge instructions indicate otherwise, you may remove your bandages 48 hours after surgery, and you may shower at that time.  You may have steri-strips (small skin tapes) in place directly over the incision.  These strips should be left on the skin for 7-10 days.   Any sutures or staples will be removed at the office during your follow-up  visit. 8. ACTIVITIES:  You may resume regular daily activities (gradually increasing) beginning the next day.  Wearing a good support bra or sports bra (or the breast binder) minimizes pain and swelling.  You may have sexual intercourse when it is comfortable. a. You may drive when you no longer are taking prescription pain medication, you can comfortably wear a seatbelt, and you can safely maneuver your car and apply brakes. b. RETURN TO WORK:  __________1 week_______________ 9. You should see your doctor in the office for a follow-up appointment approximately two weeks after your surgery.  Your doctors nurse will typically make your follow-up appointment when she calls you with your pathology report.  Expect your pathology report 2-3 business days after your surgery.  You may call to check if you do not hear from [place] after three days.   WHEN TO CALL YOUR DOCTOR: 1. Fever over 101.0 2. Nausea and/or vomiting. 3. Extreme swelling or bruising. 4. Continued bleeding from incision. 5. Increased pain, redness, or drainage from the incision.  The clinic staff is available to answer your questions during regular business hours.  Please dont hesitate to call and ask to speak to one of the nurses for clinical concerns.  If you have a medical emergency, go to the nearest emergency room or call 911.  A surgeon from [facility] is always on call at the hospital.  For further questions, please visit [email or web address]

## 2020-09-06 NOTE — Interval H&P Note (Signed)
History and Physical Interval Note:  09/06/2020 7:50 AM  Mallory Stephens  has presented today for surgery, with the diagnosis of LEFT BREAST CANCER.  The various methods of treatment have been discussed with the patient and family. After consideration of risks, benefits and other options for treatment, the patient has consented to  Procedure(s): LEFT BREAST LUMPECTOMY WITH RADIOACTIVE SEED LOCALIZATION (Left) as a surgical intervention.  The patient's history has been reviewed, patient examined, no change in status, stable for surgery.  I have reviewed the patient's chart and labs.  Questions were answered to the patient's satisfaction.     [name]

## 2020-09-06 NOTE — Anesthesia Procedure Notes (Signed)
Procedure Name: LMA Insertion Date/Time: 09/06/2020 8:04 AM Performed by: Renato Shin, CRNA Pre-anesthesia Checklist: Patient identified, Emergency Drugs available, Suction available and Patient being monitored Patient Re-evaluated:Patient Re-evaluated prior to induction Oxygen Delivery Method: Circle system utilized Preoxygenation: Pre-oxygenation with 100% oxygen Induction Type: IV induction LMA: LMA inserted LMA Size: 4.0 Placement Confirmation: positive ETCO2 and breath sounds checked- equal and bilateral Tube secured with: Tape Dental Injury: Teeth and Oropharynx as per pre-operative assessment

## 2020-09-06 NOTE — Transfer of Care (Signed)
Immediate Anesthesia Transfer of Care Note  Patient: Mallory Stephens  Procedure(s) Performed: LEFT BREAST LUMPECTOMY WITH RADIOACTIVE SEED LOCALIZATION (Left Breast)  Patient Location: PACU  Anesthesia Type:General  Level of Consciousness: awake and patient cooperative  Airway & Oxygen Therapy: Patient Spontanous Breathing and Patient connected to nasal cannula oxygen  Post-op Assessment: Report given to RN and Post -op Vital signs reviewed and stable  Post vital signs: Reviewed and stable  Last Vitals:  Vitals Value Taken Time  BP 110/80 09/06/20 0920  Temp    Pulse 58 09/06/20 0921  Resp 16 09/06/20 0921  SpO2 100 % 09/06/20 0921  Vitals shown include unvalidated device data.  Last Pain:  Vitals:   09/06/20 0641  TempSrc: Oral  PainSc: 0-No pain         Complications: No complications documented.

## 2020-09-07 ENCOUNTER — Encounter (HOSPITAL_COMMUNITY): Payer: Self-pay | Admitting: General Surgery

## 2020-09-07 LAB — SURGICAL PATHOLOGY

## 2020-09-12 ENCOUNTER — Encounter: Payer: Self-pay | Admitting: *Deleted

## 2020-10-08 ENCOUNTER — Other Ambulatory Visit: Payer: Self-pay | Admitting: *Deleted

## 2020-10-08 MED ORDER — AMLODIPINE BESYLATE 5 MG PO TABS: 5.0000 mg | ORAL_TABLET | Freq: Every day | ORAL | 2 refills | Status: DC

## 2020-10-31 NOTE — Progress Notes (Signed)
[facility]  [phone] [phone]     ID: Bernadette Hoit DOB: Oct 28, 1932  MR#: [id number]  OHY#:[id number]  Patient Care Team: [name], MD as PCP - General (Family Medicine) Ritter Helsley, Virgie Dad, MD as Consulting Physician (Oncology) [name], MD as Consulting Physician (Radiation Oncology) [name], MD as Consulting Physician (General Surgery) [name], MD as Consulting Physician (Cardiology) [name], RN as Oncology Nurse Navigator [name], RN as Oncology Nurse Navigator [name], MD as Consulting Physician (Obstetrics and Gynecology) [name], MD as Referring Physician (Dermatology) [name], MD OTHER MD:  CHIEF COMPLAINT: Estrogen receptor positive breast cancer  CURRENT TREATMENT: anastrozole; on Eliquis   INTERVAL HISTORY: Jaretssi returns today for follow up of her estrogen receptor positive breast cancer. She was evaluated in the breast cancer clinic on 08/20/2020.  She is accompanied by her daughter  Since consultation, she underwent left lumpectomy on 09/06/2020 under Dr. Barry Dienes. Pathology from the procedure ([id number]) showed: invasive ductal carcinoma, grade 2, 1.8 cm; ductal carcinoma in situ, intermediate grade; calcifications associated with carcinoma; margins uninvolved.  She started anastrozole on 10/02/2020.  She is tolerating this with no side effects that she is aware of and particularly hot flashes and vaginal dryness are not an issue.  She has not had any arthralgias or myalgias today.  She met with our genetic counselor earlier today.  She is scheduled for bone density screening on 11/07/2020.   REVIEW OF SYSTEMS: Heike tells me she essentially had no pain from her surgery and did not take any pain medicine.  She does her housework shops and drives a little.  She is not otherwise exercising regularly and she is a little bit dizzy at times so she feels a little  uncomfortable going on walks.  She does have a walking cane that she mostly does not use.  A detailed review of systems today was otherwise stable   COVID 19 VACCINATION STATUS: [place] x2, with booster February 2021   HISTORY OF CURRENT ILLNESS: CANDAS DEEMER had routine screening mammography showing a possible abnormality in the left breast. She underwent left diagnostic mammography with tomography and left breast ultrasonography at [facility] on 07/23/2020 showing: breast density category C; 1.6 cm mass involving upper-inner left breast at 11 o'clock, with correlative palpable thickening; no pathologic left axillary lymphadenopathy.  Accordingly on 07/24/2020 she proceeded to biopsy of the left breast area in question. The pathology from this procedure ([id number]) showed: invasive and in situ mammary carcinoma, e-cadherin positive, grade 1-2. Prognostic indicators significant for: estrogen receptor, 95% positive and progesterone receptor, 70% positive, both with strong staining intensity. Proliferation marker Ki67 at 10%. HER2 equivocal by immunohistochemistry (2+), but negative by fluorescent in situ hybridization with a signals ratio 1.48 and number per cell 2.45.  Cancer Staging Malignant neoplasm of upper-inner quadrant of left breast in female, estrogen receptor positive ([place]) Staging form: Breast, AJCC 8th Edition - Clinical stage from 08/23/2020: Stage IA (cT1c, cN0, cM0, G2, ER+, PR+, HER2-) - Signed by [name], MD on 08/29/2020 Stage prefix: Initial diagnosis Method of lymph node assessment: Clinical  The patient's subsequent history is as detailed below.   PAST MEDICAL HISTORY: Past Medical History:  Diagnosis Date  . Breast cancer ([facility]) 07/24/2020  . Dyslipidemia   . Dysrhythmia    atrial fibrillation  . Edema extremities 09/03/2016  . Family history of breast cancer   . Family history of prostate cancer   .  Fatty liver   . GERD (gastroesophageal  reflux disease)   . History of basal cell carcinoma   . Hypertension   . Lymphocytosis    followed by Dr. Julien Nordmann  . Multinodular goiter   . Osteoarthritis   . Permanent atrial fibrillation (H[facility])    atrial fibrillation s/p DCCV 3/10 and 6/10  . PONV (postoperative nausea and vomiting)   . PUD (peptic ulcer disease)   . Pulmonary nodules    followed  by Dr. Stephanie Acre  . UTI (urinary tract infection) 04/2017    PAST SURGICAL HISTORY: Past Surgical History:  Procedure Laterality Date  . aspiration of cyst  1978  . BREAST LUMPECTOMY WITH RADIOACTIVE SEED LOCALIZATION Left 09/06/2020   Procedure: LEFT BREAST LUMPECTOMY WITH RADIOACTIVE SEED LOCALIZATION;  Surgeon: [name], MD;  Location: [facility];  Service: General;  Laterality: Left;  . [place]   right breast for benign lesion  . BREAST SURGERY  1971   bilateral lumpectomy for beingn lesions  . CARDIOVERSION  07/22/2011   Procedure: CARDIOVERSION;  Surgeon: [name], MD;  Location: [facility];  Service: Cardiovascular;  Laterality: N/A;  . CARDIOVERSION  09/02/2011   Procedure: CARDIOVERSION;  Surgeon: [name], MD;  Location: [facility];  Service: Cardiovascular;  Laterality: N/A;  . cataract     cataract removal-bilateral  . CHOLECYSTECTOMY  2003  . CYSTOSCOPY    . DILATION AND CURETTAGE OF UTERUS  1979  . JOINT REPLACEMENT  2002   Right knee  . JOINT REPLACEMENT  2007   left knee    FAMILY HISTORY: Family History  Problem Relation Age of Onset  . Stroke Mother   . Stroke Father   . Breast cancer Sister        dx 33s  . Heart disease Brother   . Prostate cancer Brother        dx >50, metastatic  . Prostate cancer Brother        dx >50, metastatic  . Arthritis Sister   . Scoliosis Sister   . Breast cancer Niece        dx <50  . Breast cancer Niece        dx <50  . Prostate cancer Nephew        dx <50  The patient's father died in his 74s from a ruptured aneurysm.  The patient's mother died from a  stroke in her 60s.  The patient has 1 cousin with breast cancer diagnosed around age 80.  She also has 4 brothers, 2 of whom had prostate cancer, and 3 sisters, 1 of whom had breast cancer diagnosed in her 5s.  There is no history of ovarian or pancreatic cancer in the family to her knowledge.    GYNECOLOGIC HISTORY:  No LMP recorded. Patient is postmenopausal. Menarche: 87 years old [place] P 0 LMP  Contraceptive more than 5 years, with no complications HRT   at least 5 years Hysterectomy? no BSO? 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  She is widowed.  She and her sister [name] share a home.    ADVANCED DIRECTIVES: The patient has named her Sister Everlene Farrier and her niece [name] as Equities trader powers of attorney.   HEALTH MAINTENANCE: Social History   Tobacco Use  . Smoking status: Former Smoker    Packs/day: 0.50    Years: 10.00    Pack years: 5.00  Quit date: 07/16/1989    Years since quitting: 31.3  . Smokeless tobacco: Never Used  Vaping Use  . Vaping Use: Never used  Substance Use Topics  . Alcohol use: Yes    Comment: occasional wine  . Drug use: No     Colonoscopy: Around age 85, in [place]  PAP: Per Dr. Genia Harold  Bone density: repeat scheduled 11/2020   Allergies  Allergen Reactions  . Sulfa Drugs Cross Reactors Rash    Current Outpatient Medications  Medication Sig Dispense Refill  . acetaminophen (TYLENOL) 325 MG tablet Take 325 mg by mouth every 6 (six) hours as needed for moderate pain.    Marland Kitchen amLODipine (NORVASC) 5 MG tablet Take 1 tablet (5 mg total) by mouth daily. 90 tablet 2  . anastrozole (ARIMIDEX) 1 MG tablet Take 1 tablet (1 mg total) by mouth daily. Start October 02, 2020 90 tablet 4  . Capsaicin-Menthol (SALONPAS GEL-PATCH HOT EX) Apply 1 tablet topically daily as needed (lower back pain).    Marland Kitchen ELIQUIS 5 MG TABS tablet TAKE 1 TABLET TWICE A DAY (Patient taking differently: Take 5 mg  by mouth 2 (two) times daily.) 180 tablet 1  . famotidine (PEPCID) 40 MG tablet Take 40 mg by mouth at bedtime.    Marland Kitchen losartan (COZAAR) 100 MG tablet TAKE 1 TABLET BY MOUTH EVERY DAY (Patient taking differently: Take 100 mg by mouth daily.) 90 tablet 1  . metoprolol tartrate (LOPRESSOR) 25 MG tablet TAKE 1 TABLET TWICE A DAY (Patient taking differently: Take 25 mg by mouth 2 (two) times daily.) 180 tablet 2  . Multiple Vitamin (MULTIVITAMIN WITH MINERALS) TABS tablet Take 1 tablet by mouth daily.    Marland Kitchen PARoxetine (PAXIL) 10 MG tablet Take 10 mg by mouth every morning.    . polyvinyl alcohol (LIQUIFILM TEARS) 1.4 % ophthalmic solution Place 1-2 drops into both eyes every 6 (six) hours as needed for dry eyes.    . pravastatin (PRAVACHOL) 20 MG tablet Take 20 mg by mouth at bedtime.    . solifenacin (VESICARE) 5 MG tablet Take 5 mg by mouth daily.    . traMADol (ULTRAM) 50 MG tablet Take 1 tablet (50 mg total) by mouth every 6 (six) hours as needed for moderate pain or severe pain. 20 tablet 0   No current facility-administered medications for this visit.    OBJECTIVE: White woman in no acute distress  Vitals:   11/01/20 1107  BP: 127/69  Pulse: 75  Resp: 18  Temp: 97.7 F (36.5 C)  SpO2: 96%     Body mass index is 24.29 kg/m.   Wt Readings from Last 3 Encounters:  11/01/20 150 lb 8 oz (68.3 kg)  09/06/20 150 lb (68 kg)  08/31/20 151 lb (68.5 kg)     ECOG FS:  Sclerae unicteric, EOMs intact Wearing a mask No cervical or supraclavicular adenopathy Lungs no rales or rhonchi Heart regular rate and rhythm Abd soft, nontender, positive bowel sounds MSK no focal spinal tenderness, no upper extremity lymphedema Neuro: nonfocal, well oriented, appropriate affect Breasts: The right breast is benign per the left breast is status post recent lumpectomy.  The incision has healed nicely.  The cosmetic result is very good.  There is no evidence of residual recurrent disease.  Both axilla are  benign.  LAB RESULTS:  CMP     Component Value Date/Time   NA 142 11/01/2020 1032   NA 141 02/22/2020 1041   K 4.5 11/01/2020 1032  CL 105 11/01/2020 1032   CO2 26 11/01/2020 1032   GLUCOSE 104 (H) 11/01/2020 1032   BUN 25 (H) 11/01/2020 1032   BUN 20 02/22/2020 1041   CREATININE 1.25 (H) 11/01/2020 1032   CREATININE 1.50 (H) 08/29/2020 1519   CREATININE 1.32 (H) 08/09/2015 1122   CALCIUM 10.0 11/01/2020 1032   PROT 7.4 11/01/2020 1032   PROT 6.5 06/01/2019 0958   ALBUMIN 4.2 11/01/2020 1032   ALBUMIN 4.2 06/01/2019 0958   AST 20 11/01/2020 1032   AST 16 08/29/2020 1519   ALT 19 11/01/2020 1032   ALT 14 08/29/2020 1519   ALKPHOS 49 11/01/2020 1032   BILITOT 0.8 11/01/2020 1032   BILITOT 0.8 08/29/2020 1519   GFRNONAA 42 (L) 11/01/2020 1032   GFRNONAA 34 (L) 08/29/2020 1519   GFRAA 44 (L) 02/22/2020 1041    No results found for: TOTALPROTELP, ALBUMINELP, A1GS, A2GS, BETS, BETA2SER, GAMS, MSPIKE, SPEI  Lab Results  Component Value Date   WBC 12.6 (H) 11/01/2020   NEUTROABS 6.8 11/01/2020   HGB 14.5 11/01/2020   HCT 44.4 11/01/2020   MCV 91.5 11/01/2020   PLT 242 11/01/2020    No results found for: LABCA2  No components found for: PPIRJJ884  No results for input(s): INR in the last 168 hours.  No results found for: LABCA2  No results found for: ZYS063  No results found for: KZS010  No results found for: XNA355  No results found for: CA2729  No components found for: HGQUANT  No results found for: CEA1 / No results found for: CEA1   No results found for: AFPTUMOR  No results found for: CHROMOGRNA  No results found for: KPAFRELGTCHN, LAMBDASER, KAPLAMBRATIO (kappa/lambda light chains)  No results found for: HGBA, HGBA2QUANT, HGBFQUANT, HGBSQUAN (Hemoglobinopathy evaluation)   Lab Results  Component Value Date   LDH 164 02/22/2009    No results found for: IRON, TIBC, IRONPCTSAT (Iron and TIBC)  No results found for:  FERRITIN  Urinalysis    Component Value Date/Time   COLORURINE YELLOW 04/28/2017 1310   APPEARANCEUR CLEAR 04/28/2017 1310   LABSPEC 1.011 04/28/2017 1310   PHURINE 5.0 04/28/2017 1310   GLUCOSEU NEGATIVE 04/28/2017 1310   HGBUR MODERATE (A) 04/28/2017 1310   BILIRUBINUR NEGATIVE 04/28/2017 1310   KETONESUR NEGATIVE 04/28/2017 1310   PROTEINUR NEGATIVE 04/28/2017 1310   UROBILINOGEN 0.2 06/23/2010 1322   NITRITE POSITIVE (A) 04/28/2017 1310   LEUKOCYTESUR NEGATIVE 04/28/2017 1310    STUDIES: No results found.   ELIGIBLE FOR AVAILABLE RESEARCH PROTOCOL: AET  ASSESSMENT: 87 y.o. [place] woman status post left breast upper inner quadrant biopsy 07/24/2020 for a clinical T1c N0, stage IA invasive ductal carcinoma, grade 1 or 2, estrogen and progesterone receptor positive, HER2 not amplified, with an MIB-1 of 10%  (1) status post left lumpectomy 09/06/2020 for a pT1c pNX invasive ductal carcinoma, grade 2, with negative margins.  (2) opted against adjuvant radiation  (3) started anastrozole 10/02/2020  (a) bone density  (4) genetics testing   PLAN: Nivia did remarkably well with her surgery and she is tolerating anastrozole well.  Assuming no changes in that the plan is for anastrozole for 5 years.  She will have a bone density in April.  In the meantime I have asked her to start taking vitamin D at 1000 mg daily.  I put that prescription in for her.  We discussed fall precautions today.  She also had genetics testing and those results are pending.  When she  sees me again in October we will review the genetics and bone density issues.  From that point I expect we will be able to see her on a once a year basis  Total encounter time 25 minutes.Sarajane Jews C. Peytan Andringa, MD 11/01/2020 11:15 AM Medical Oncology and Hematology [facility] [place], [place] [address] Tel. [phone]    Fax. [phone]   I, Wilburn Mylar, am acting as  scribe for Dr. Virgie Dad. Monae Topping.  I, Lurline Del MD, have reviewed the above documentation for accuracy and completeness, and I agree with the above.   *Total Encounter Time as defined by the Centers for Medicare and Medicaid Services includes, in addition to the face-to-face time of a patient visit (documented in the note above) non-face-to-face time: obtaining and reviewing outside history, ordering and reviewing medications, tests or procedures, care coordination (communications with other health care professionals or caregivers) and documentation in the medical record.

## 2020-11-01 ENCOUNTER — Inpatient Hospital Stay (HOSPITAL_BASED_OUTPATIENT_CLINIC_OR_DEPARTMENT_OTHER): Payer: Medicare HMO | Admitting: Genetic Counselor

## 2020-11-01 ENCOUNTER — Encounter: Payer: Self-pay | Admitting: Genetic Counselor

## 2020-11-01 ENCOUNTER — Inpatient Hospital Stay: Payer: Medicare HMO

## 2020-11-01 ENCOUNTER — Inpatient Hospital Stay: Payer: Medicare HMO | Attending: Oncology | Admitting: Oncology

## 2020-11-01 ENCOUNTER — Other Ambulatory Visit: Payer: Self-pay

## 2020-11-01 ENCOUNTER — Other Ambulatory Visit: Payer: Self-pay | Admitting: Genetic Counselor

## 2020-11-01 VITALS — BP 127/69 | HR 75 | Temp 97.7°F | Resp 18 | Ht 66.0 in | Wt 150.5 lb

## 2020-11-01 DIAGNOSIS — Z170 Estrogen receptor positive status [ER+]: Secondary | ICD-10-CM

## 2020-11-01 DIAGNOSIS — Z803 Family history of malignant neoplasm of breast: Secondary | ICD-10-CM | POA: Diagnosis not present

## 2020-11-01 DIAGNOSIS — Z8042 Family history of malignant neoplasm of prostate: Secondary | ICD-10-CM | POA: Insufficient documentation

## 2020-11-01 DIAGNOSIS — C50212 Malignant neoplasm of upper-inner quadrant of left female breast: Secondary | ICD-10-CM

## 2020-11-01 DIAGNOSIS — Z85828 Personal history of other malignant neoplasm of skin: Secondary | ICD-10-CM | POA: Diagnosis not present

## 2020-11-01 DIAGNOSIS — R55 Syncope and collapse: Secondary | ICD-10-CM

## 2020-11-01 DIAGNOSIS — Z87891 Personal history of nicotine dependence: Secondary | ICD-10-CM | POA: Insufficient documentation

## 2020-11-01 DIAGNOSIS — Z8261 Family history of arthritis: Secondary | ICD-10-CM | POA: Insufficient documentation

## 2020-11-01 DIAGNOSIS — Z79811 Long term (current) use of aromatase inhibitors: Secondary | ICD-10-CM | POA: Insufficient documentation

## 2020-11-01 DIAGNOSIS — Z79899 Other long term (current) drug therapy: Secondary | ICD-10-CM | POA: Diagnosis not present

## 2020-11-01 DIAGNOSIS — Z7901 Long term (current) use of anticoagulants: Secondary | ICD-10-CM | POA: Insufficient documentation

## 2020-11-01 DIAGNOSIS — Z87440 Personal history of urinary (tract) infections: Secondary | ICD-10-CM | POA: Insufficient documentation

## 2020-11-01 DIAGNOSIS — I10 Essential (primary) hypertension: Secondary | ICD-10-CM | POA: Diagnosis not present

## 2020-11-01 DIAGNOSIS — I4821 Permanent atrial fibrillation: Secondary | ICD-10-CM | POA: Insufficient documentation

## 2020-11-01 DIAGNOSIS — Z8711 Personal history of peptic ulcer disease: Secondary | ICD-10-CM | POA: Insufficient documentation

## 2020-11-01 DIAGNOSIS — Z882 Allergy status to sulfonamides status: Secondary | ICD-10-CM | POA: Insufficient documentation

## 2020-11-01 DIAGNOSIS — Z823 Family history of stroke: Secondary | ICD-10-CM | POA: Diagnosis not present

## 2020-11-01 DIAGNOSIS — E785 Hyperlipidemia, unspecified: Secondary | ICD-10-CM

## 2020-11-01 DIAGNOSIS — Z9049 Acquired absence of other specified parts of digestive tract: Secondary | ICD-10-CM | POA: Insufficient documentation

## 2020-11-01 DIAGNOSIS — Z8249 Family history of ischemic heart disease and other diseases of the circulatory system: Secondary | ICD-10-CM | POA: Diagnosis not present

## 2020-11-01 LAB — COMPREHENSIVE METABOLIC PANEL
ALT: 19 U/L (ref 0–44)
AST: 20 U/L (ref 15–41)
Albumin: 4.2 g/dL (ref 3.5–5.0)
Alkaline Phosphatase: 49 U/L (ref 38–126)
Anion gap: 11 (ref 5–15)
BUN: 25 mg/dL — ABNORMAL HIGH (ref 8–23)
CO2: 26 mmol/L (ref 22–32)
Calcium: 10 mg/dL (ref 8.9–10.3)
Chloride: 105 mmol/L (ref 98–111)
Creatinine, Ser: 1.25 mg/dL — ABNORMAL HIGH (ref 0.44–1.00)
GFR, Estimated: 42 mL/min — ABNORMAL LOW (ref >60)
Glucose, Bld: 104 mg/dL — ABNORMAL HIGH (ref 70–99)
Potassium: 4.5 mmol/L (ref 3.5–5.1)
Sodium: 142 mmol/L (ref 135–145)
Total Bilirubin: 0.8 mg/dL (ref 0.3–1.2)
Total Protein: 7.4 g/dL (ref 6.5–8.1)

## 2020-11-01 LAB — CBC WITH DIFFERENTIAL/PLATELET
Abs Immature Granulocytes: 0.03 10*3/uL (ref 0.00–0.07)
Basophils Absolute: 0.1 10*3/uL (ref 0.0–0.1)
Basophils Relative: 1 %
Eosinophils Absolute: 0.3 10*3/uL (ref 0.0–0.5)
Eosinophils Relative: 3 %
HCT: 44.4 % (ref 36.0–46.0)
Hemoglobin: 14.5 g/dL (ref 12.0–15.0)
Immature Granulocytes: 0 %
Lymphocytes Relative: 36 %
Lymphs Abs: 4.5 10*3/uL — ABNORMAL HIGH (ref 0.7–4.0)
MCH: 29.9 pg (ref 26.0–34.0)
MCHC: 32.7 g/dL (ref 30.0–36.0)
MCV: 91.5 fL (ref 80.0–100.0)
Monocytes Absolute: 0.8 10*3/uL (ref 0.1–1.0)
Monocytes Relative: 6 %
Neutro Abs: 6.8 10*3/uL (ref 1.7–7.7)
Neutrophils Relative %: 54 %
Platelets: 242 10*3/uL (ref 150–400)
RBC: 4.85 MIL/uL (ref 3.87–5.11)
RDW: 13 % (ref 11.5–15.5)
WBC: 12.6 10*3/uL — ABNORMAL HIGH (ref 4.0–10.5)
nRBC: 0 % (ref 0.0–0.2)

## 2020-11-01 LAB — GENETIC SCREENING ORDER

## 2020-11-01 MED ORDER — ANASTROZOLE 1 MG PO TABS: 1.0000 mg | ORAL_TABLET | Freq: Every day | ORAL | 4 refills | Status: DC

## 2020-11-01 MED ORDER — VITAMIN D 25 MCG (1000 UNIT) PO TABS: 1000.0000 [IU] | ORAL_TABLET | Freq: Every day | ORAL | 4 refills | Status: AC

## 2020-11-01 NOTE — Progress Notes (Signed)
REFERRING PROVIDER: [name], MD [place],  [place] [address]  PRIMARY PROVIDER:  [name], MD  PRIMARY REASON FOR VISIT:  1. Malignant neoplasm of upper-inner quadrant of left breast in female, estrogen receptor positive ([facility])   2. History of basal cell carcinoma   3. Family history of breast cancer   4. Family history of prostate cancer       HISTORY OF PRESENT ILLNESS:   Mallory Stephens, a 87 y.o. female, was seen for a [facility] cancer genetics consultation at the request of Dr. Jana Hakim due to a personal and family history of cancer.  Mallory Stephens presents to clinic today with her niece to discuss the possibility of a hereditary predisposition to cancer, genetic testing, and to further clarify her future cancer risks, as well as potential cancer risks for family members.   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 The treatment plan included surgery (lumpectomy completed 09/06/20) and antiestrogen therapy.   Mallory Stephens also has a history of basal cell carcinoma removed from her forehead in 2014 at the age of 61.  CANCER HISTORY:  Oncology History  Malignant neoplasm of upper-inner quadrant of left breast in female, estrogen receptor positive ([facility])  08/20/2020 Initial Diagnosis   Malignant neoplasm of upper-inner quadrant of left breast in female, estrogen receptor positive ([facility])   08/23/2020 Cancer Staging   Staging form: Breast, AJCC 8th Edition - Clinical stage from 08/23/2020: Stage IA (cT1c, cN0, cM0, G2, ER+, PR+, HER2-) - Signed by [name], MD on 08/29/2020      RISK FACTORS:  Menarche was at age 25.  No live births.  OCP use for more than 5 years.  Ovaries intact: yes.  Hysterectomy: no.  Menopausal status: postmenopausal.  HRT use: at least 5 years. Colonoscopy: yes; around age 72 in [place]. Mammogram within the  last year: yes.   Past Medical History:  Diagnosis Date  . Breast cancer ([facility]) 07/24/2020  . Dyslipidemia   . Dysrhythmia    atrial fibrillation  . Edema extremities 09/03/2016  . Family history of breast cancer   . Family history of prostate cancer   . Fatty liver   . GERD (gastroesophageal reflux disease)   . History of basal cell carcinoma   . Hypertension   . Lymphocytosis    followed by Dr. Julien Nordmann  . Multinodular goiter   . Osteoarthritis   . Permanent atrial fibrillation ([facility])    atrial fibrillation s/p DCCV 3/10 and 6/10  . PONV (postoperative nausea and vomiting)   . PUD (peptic ulcer disease)   . Pulmonary nodules    followed  by Dr. Stephanie Acre  . UTI (urinary tract infection) 04/2017    Past Surgical History:  Procedure Laterality Date  . aspiration of cyst  1978  . BREAST LUMPECTOMY WITH RADIOACTIVE SEED LOCALIZATION Left 09/06/2020   Procedure: LEFT BREAST LUMPECTOMY WITH RADIOACTIVE SEED LOCALIZATION;  Surgeon: [name], MD;  Location: [facility];  Service: General;  Laterality: Left;  . [place]   right breast for benign lesion  . BREAST SURGERY  1971   bilateral lumpectomy for beingn lesions  . CARDIOVERSION  07/22/2011   Procedure: CARDIOVERSION;  Surgeon: [name], MD;  Location: [facility];  Service: Cardiovascular;  Laterality: N/A;  . CARDIOVERSION  09/02/2011   Procedure: CARDIOVERSION;  Surgeon: [name],  MD;  Location: [facility];  Service: Cardiovascular;  Laterality: N/A;  . cataract     cataract removal-bilateral  . CHOLECYSTECTOMY  2003  . CYSTOSCOPY    . DILATION AND CURETTAGE OF UTERUS  1979  . JOINT REPLACEMENT  2002   Right knee  . JOINT REPLACEMENT  2007   left knee    Social History   Socioeconomic History  . Marital status: Widowed    Spouse name: Not on file  . Number of children: Not on file  . Years of education: Not on file  . Highest education level: Not on file  Occupational History  . Not on file  Tobacco  Use  . Smoking status: Former Smoker    Packs/day: 0.50    Years: 10.00    Pack years: 5.00    Quit date: 07/16/1989    Years since quitting: 31.3  . Smokeless tobacco: Never Used  Vaping Use  . Vaping Use: Never used  Substance and Sexual Activity  . Alcohol use: Yes    Comment: occasional wine  . Drug use: No  . Sexual activity: Not on file  Other Topics Concern  . Not on file  Social History Narrative  . Not on file   Social Determinants of Health   Financial Resource Strain: Not on file  Food Insecurity: Not on file  Transportation Needs: Not on file  Physical Activity: Not on file  Stress: Not on file  Social Connections: Not on file     FAMILY HISTORY:  We obtained a detailed, 4-generation family history.  Significant diagnoses are listed below: Family History  Problem Relation Age of Onset  . Stroke Mother   . Stroke Father   . Breast cancer Sister        dx 25s  . Heart disease Brother   . Prostate cancer Brother        dx >50, metastatic  . Prostate cancer Brother        dx >50, metastatic  . Arthritis Sister   . Scoliosis Sister   . Breast cancer Niece        dx <50  . Breast cancer Niece        dx <50  . Prostate cancer Nephew        dx <50   Mallory Stephens does not have children. She had four brothers and three sisters. One sister was diagnosed with breast cancer in her 52s. Two brothers died from metastatic prostate cancer, both diagnosed older than 40. One nephew was diagnosed with prostate cancer younger than 25, and two nieces were diagnosed with breast cancer younger than 36.  Mallory Stephens mother died in her 59s without cancer. There were three maternal aunts and two maternal uncles. There is no known cancer among maternal aunts/uncles or maternal cousins, although she has limited information about these family members. Mallory Stephens does not have information about her maternal grandparents.  Mallory Stephens father died in his 3s without cancer.  There were two paternal aunts and three paternal uncles. There is no known cancer among paternal aunts/uncles or paternal cousins. Mallory Stephens does not have information about her paternal grandparents.   Mallory Stephens is unaware of previous family history of genetic testing for hereditary cancer risks. Patient's maternal and paternal ancestors are of [place] and [place] descent. There is no reported Ashkenazi Jewish ancestry. There is no known consanguinity.  GENETIC COUNSELING ASSESSMENT: Mallory Stephens is a 87 y.o. female with a personal history of  breast cancer and a family history of breast cancer and prostate cancer, which is somewhat suggestive of a hereditary cancer syndrome and predisposition to cancer. We, therefore, discussed and recommended the following at today's visit.   DISCUSSION: We discussed that approximately 5-10% of breast cancer is hereditary, with most cases associated with the BRCA1 and BRCA2 genes. There are other genes that can be associated with hereditary breast cancer syndromes. These include ATM, CHEK2, PALB2, etc. We discussed that testing is beneficial for several reasons, including knowing about other cancer risks, identifying potential screening and risk-reduction options that may be appropriate, and to understand if other family members could be at risk for cancer and allow them to undergo genetic testing.  We reviewed the characteristics, features and inheritance patterns of hereditary cancer syndromes. We also discussed genetic testing, including the appropriate family members to test, the process of testing, insurance coverage and turn-around-time for results. We discussed the implications of a negative, positive and/or variant of uncertain significant result. We recommended Mallory Stephens pursue genetic testing for the [facility] CancerNext-Expanded + RNAinsight gene panel.   The CancerNext-Expanded + RNAinsight gene panel offered by [employer] and includes sequencing and  rearrangement analysis for the following 77 genes: AIP, ALK, APC, ATM, AXIN2, BAP1, BARD1, BLM, BMPR1A, BRCA1, BRCA2, BRIP1, CDC73, CDH1, CDK4, CDKN1B, CDKN2A, CHEK2, CTNNA1, DICER1, FANCC, FH, FLCN, GALNT12, KIF1B, LZTR1, MAX, MEN1, MET, MLH1, MSH2, MSH3, MSH6, MUTYH, NBN, NF1, NF2, NTHL1, PALB2, PHOX2B, PMS2, POT1, PRKAR1A, PTCH1, PTEN, RAD51C, RAD51D, RB1, RECQL, RET, SDHA, SDHAF2, SDHB, SDHC, SDHD, SMAD4, SMARCA4, SMARCB1, SMARCE1, STK11, SUFU, TMEM127, TP53, TSC1, TSC2, VHL and XRCC2 (sequencing and deletion/duplication); EGFR, EGLN1, HOXB13, KIT, MITF, PDGFRA, POLD1 and POLE (sequencing only); EPCAM and GREM1 (deletion/duplication only). RNA data is routinely analyzed for use in variant interpretation for all genes.  Based on Mallory Stephens's personal and family history of cancer, she meets medical criteria for genetic testing. Despite that she meets criteria, there may still be an out of pocket cost.  PLAN: After considering the risks, benefits, and limitations, Mallory Stephens provided informed consent to pursue genetic testing and the blood sample was sent to [facility] for analysis of the CancerNext-Expanded + RNAinsight panel. Results should be available within approximately two-three weeks' time, at which point they will be disclosed by telephone to Mallory Stephens, as will any additional recommendations warranted by these results. Mallory Stephens will receive a summary of her genetic counseling visit and a copy of her results once available. This information will also be available in Epic.   Mallory Stephens questions were answered to her satisfaction today. Our contact information was provided should additional questions or concerns arise. Thank you for the referral and allowing [place] to share in the care of your patient.   Clint Guy, [place], [facility] Licensed, Certified Dispensing [email or web address] Phone: [phone]  The patient was seen for a total of 30 minutes in  face-to-face genetic counseling.  This patient was discussed with Drs. Magrinat, Lindi Adie and/or Burr Medico who agrees with the above.    _______________________________________________________________________ For Office Staff:  Number of people involved in session: 1 Was an Intern/ student involved with case: no

## 2020-11-02 ENCOUNTER — Telehealth: Payer: Self-pay | Admitting: Oncology

## 2020-11-02 NOTE — Telephone Encounter (Signed)
Scheduled per 3/31 los. Called and spoke with pt confirmed 10/3 appts

## 2020-11-07 ENCOUNTER — Other Ambulatory Visit: Payer: Medicare HMO

## 2020-11-13 ENCOUNTER — Telehealth: Payer: Self-pay

## 2020-11-13 NOTE — Telephone Encounter (Signed)
Pt called stating she needs bone density scan before next appt with Dr Jana Hakim. Pt states [facility] is backed up to September for appts. Dr Jana Hakim recommends contacting her Gynecologist for scheduling this and having them fax [place] the results. Pt verbalized thanks and understanding.

## 2020-11-26 ENCOUNTER — Other Ambulatory Visit: Payer: Self-pay

## 2020-11-26 ENCOUNTER — Telehealth: Payer: Self-pay

## 2020-11-26 DIAGNOSIS — C50212 Malignant neoplasm of upper-inner quadrant of left female breast: Secondary | ICD-10-CM

## 2020-11-26 DIAGNOSIS — Z170 Estrogen receptor positive status [ER+]: Secondary | ICD-10-CM

## 2020-11-26 DIAGNOSIS — Z1379 Encounter for other screening for genetic and chromosomal anomalies: Secondary | ICD-10-CM | POA: Insufficient documentation

## 2020-11-26 NOTE — Telephone Encounter (Signed)
Pt called stating she is not able to get bone scan from gynecologist. Order sent to [facility] for them to perform and call pt for appt. Pt verbalized thanks and understanding.

## 2020-11-28 ENCOUNTER — Ambulatory Visit: Payer: Self-pay | Admitting: Genetic Counselor

## 2020-11-28 ENCOUNTER — Telehealth: Payer: Self-pay | Admitting: Genetic Counselor

## 2020-11-28 ENCOUNTER — Encounter: Payer: Self-pay | Admitting: Genetic Counselor

## 2020-11-28 DIAGNOSIS — Z1379 Encounter for other screening for genetic and chromosomal anomalies: Secondary | ICD-10-CM

## 2020-11-28 NOTE — Progress Notes (Signed)
HPI:  Ms. Rosete was previously seen in the [facility] clinic due to a personal and family history of cancer and concerns regarding a hereditary predisposition to cancer. Please refer to our prior cancer genetics clinic note for more information regarding our discussion, assessment and recommendations, at the time. Ms. Swalley recent genetic test results were disclosed to her, as were recommendations warranted by these results. These results and recommendations are discussed in more detail below.  CANCER HISTORY:  Oncology History  Malignant neoplasm of upper-inner quadrant of left breast in female, estrogen receptor positive ([facility])  08/20/2020 Initial Diagnosis   Malignant neoplasm of upper-inner quadrant of left breast in female, estrogen receptor positive ([facility])   08/23/2020 Cancer Staging   Staging form: Breast, AJCC 8th Edition - Clinical stage from 08/23/2020: Stage IA (cT1c, cN0, cM0, G2, ER+, PR+, HER2-) - Signed by [name], MD on 08/29/2020   11/26/2020 Genetic Testing   Negative genetic testing:  No pathogenic variants detected on the [facility] CancerNext-Expanded + RNAinsight panel. A variant of uncertain significance (VUS) was detected in the MSH6 gene called p.E1310D (c.3930G>C). The report date is 11/26/2020.  The CancerNext-Expanded + RNAinsight gene panel offered by [employer] and includes sequencing and rearrangement analysis for the following 77 genes: AIP, ALK, APC, ATM, AXIN2, BAP1, BARD1, BLM, BMPR1A, BRCA1, BRCA2, BRIP1, CDC73, CDH1, CDK4, CDKN1B, CDKN2A, CHEK2, CTNNA1, DICER1, FANCC, FH, FLCN, GALNT12, KIF1B, LZTR1, MAX, MEN1, MET, MLH1, MSH2, MSH3, MSH6, MUTYH, NBN, NF1, NF2, NTHL1, PALB2, PHOX2B, PMS2, POT1, PRKAR1A, PTCH1, PTEN, RAD51C, RAD51D, RB1, RECQL, RET, SDHA, SDHAF2, SDHB, SDHC, SDHD, SMAD4, SMARCA4, SMARCB1, SMARCE1, STK11, SUFU, TMEM127, TP53, TSC1, TSC2, VHL and XRCC2 (sequencing and deletion/duplication); EGFR, EGLN1, HOXB13, KIT, MITF,  PDGFRA, POLD1 and POLE (sequencing only); EPCAM and GREM1 (deletion/duplication only). RNA data is routinely analyzed for use in variant interpretation for all genes.      FAMILY HISTORY:  We obtained a detailed, 4-generation family history.  Significant diagnoses are listed below: Family History  Problem Relation Age of Onset  . Stroke Mother   . Stroke Father   . Breast cancer Sister        dx 95s  . Heart disease Brother   . Prostate cancer Brother        dx >50, metastatic  . Prostate cancer Brother        dx >50, metastatic  . Arthritis Sister   . Scoliosis Sister   . Breast cancer Niece        dx <50  . Breast cancer Niece        dx <50  . Prostate cancer Nephew        dx <50    Ms. Caulder does not have children. She had four brothers and three sisters. One sister was diagnosed with breast cancer in her 19s. Two brothers died from metastatic prostate cancer, both diagnosed older than 60. One nephew was diagnosed with prostate cancer younger than 67, and two nieces were diagnosed with breast cancer younger than 15.  Ms. Mccathern mother died in her 10s without cancer. There were three maternal aunts and two maternal uncles. There is no known cancer among maternal aunts/uncles or maternal cousins, although she has limited information about these family members. Ms. Gillooly does not have information about her maternal grandparents.  Ms. Ureste father died in his 46s without cancer. There were two paternal aunts and three paternal uncles. There is no known cancer among paternal aunts/uncles or paternal cousins.  Ms. Tino does not have information about her paternal grandparents.   Ms. Mcclenahan is unaware of previous family history of genetic testing for hereditary cancer risks. Patient's maternal and paternal ancestors are of [place] and [place] descent. There is no reported Ashkenazi Jewish ancestry. There is no known consanguinity.  GENETIC TEST RESULTS: Genetic  testing reported out on 11/26/2020 through the [facility] + RNAinsight panel. No pathogenic variants were detected.   The CancerNext-Expanded + RNAinsight gene panel offered by [employer] and includes sequencing and rearrangement analysis for the following 77 genes: AIP, ALK, APC, ATM, AXIN2, BAP1, BARD1, BLM, BMPR1A, BRCA1, BRCA2, BRIP1, CDC73, CDH1, CDK4, CDKN1B, CDKN2A, CHEK2, CTNNA1, DICER1, FANCC, FH, FLCN, GALNT12, KIF1B, LZTR1, MAX, MEN1, MET, MLH1, MSH2, MSH3, MSH6, MUTYH, NBN, NF1, NF2, NTHL1, PALB2, PHOX2B, PMS2, POT1, PRKAR1A, PTCH1, PTEN, RAD51C, RAD51D, RB1, RECQL, RET, SDHA, SDHAF2, SDHB, SDHC, SDHD, SMAD4, SMARCA4, SMARCB1, SMARCE1, STK11, SUFU, TMEM127, TP53, TSC1, TSC2, VHL and XRCC2 (sequencing and deletion/duplication); EGFR, EGLN1, HOXB13, KIT, MITF, PDGFRA, POLD1 and POLE (sequencing only); EPCAM and GREM1 (deletion/duplication only). RNA data is routinely analyzed for use in variant interpretation for all genes. The test report will be scanned into EPIC and located under the Molecular Pathology section of the Results Review tab.  A portion of the result report is included below for reference.     We discussed with Ms. Egli that because current genetic testing is not perfect, it is possible there may be a gene mutation in one of these genes that current testing cannot detect, but that chance is small.  We also discussed that there could be another gene that has not yet been discovered, or that we have not yet tested, that is responsible for the cancer diagnoses in the family. It is also possible there is a hereditary cause for the cancer in the family that Ms. Swoveland did not inherit and therefore was not identified in her testing.  Therefore, it is important to remain in touch with cancer genetics in the future so that we can continue to offer Ms. Evangelist the most up to date genetic testing.   Genetic testing did identify a variant of uncertain significance (VUS)  in the MSH6 gene called p.E1310D (c.3930G>C). At this time, it is unknown if this variant is associated with increased cancer risk or if this is a normal finding, but most variants such as this get reclassified to being inconsequential. It should not be used to make medical management decisions. With time, we suspect the lab will determine the significance of this variant, if any. If we do learn more about it, we will try to contact Ms. Braatz to discuss it further. However, it is important to stay in touch with [place] periodically and keep the address and phone number up to date.  CANCER SCREENING RECOMMENDATIONS: Ms. Lanza test result is considered negative (normal).  This means that we have not identified a hereditary cause for her personal and family history of cancer at this time. While reassuring, this does not definitively rule out a hereditary predisposition to cancer. It is still possible that there could be genetic mutations that are undetectable by current technology. There could be genetic mutations in genes that have not been tested or identified to increase cancer risk.  Therefore, it is recommended she continue to follow the cancer management and screening guidelines provided by her oncology and primary healthcare provider.   An individual's cancer risk and medical management are not determined by genetic test results alone.  Overall cancer risk assessment incorporates additional factors, including personal medical history, family history, and any available genetic information that may result in a personalized plan for cancer prevention and surveillance.  RECOMMENDATIONS FOR FAMILY MEMBERS:  Individuals in this family might be at some increased risk of developing cancer, over the general population risk, simply due to the family history of cancer.  We recommended women in this family have a yearly mammogram beginning at age 73, or 56 years younger than the earliest onset of cancer, an annual  clinical breast exam, and perform monthly breast self-exams. Women in this family should also have a gynecological exam as recommended by their primary provider. All family members should be referred for colonoscopy starting at age 81.  It is also possible there is a hereditary cause for the cancer in Ms. Dohrmann's family that she did not inherit and therefore was not identified in her.  Based on Ms. Liburd's family history, we recommended her nieces and nephew who was diagnosed with cancer have genetic counseling and testing. Ms. Herbst will let us know if we can be of any assistance in coordinating genetic counseling and/or testing for these family members.   FOLLOW-UP: Lastly, we discussed with Ms. Vandervoort that cancer genetics is a rapidly advancing field and it is possible that new genetic tests will be appropriate for her and/or her family members in the future. We encouraged her to remain in contact with cancer genetics on an annual basis so we can update her personal and family histories and let her know of advances in cancer genetics that may benefit this family.   Our contact number was provided. Ms. Barnfield questions were answered to her satisfaction, and she knows she is welcome to call us at anytime with additional questions or concerns.   Clint Guy, MS, [facility] Genetic Counselor [email or web address] Phone: [phone]

## 2020-11-28 NOTE — Telephone Encounter (Signed)
Revealed negative genetic testing. Discussed that we do not know why she has breast cancer or why there is cancer in the family. There could be a genetic mutation in the family that Mallory Stephens did not inherit. There could also be a mutation in a different gene that we are not testing, or our current technology may not be able to detect certain mutations. It will therefore be important for her to stay in contact with genetics to keep up with whether additional testing may be appropriate in the future.   A variant of uncertain significance was detected in the MSH6 gene called p.E1310D (c.3930G>C). Her result is still considered normal at this time and should not impact her medical management.

## 2021-02-06 ENCOUNTER — Other Ambulatory Visit: Payer: Self-pay | Admitting: Cardiology

## 2021-02-06 DIAGNOSIS — I4821 Permanent atrial fibrillation: Secondary | ICD-10-CM

## 2021-02-07 NOTE — Telephone Encounter (Signed)
Eliquis 5mg  refill request received. Patient is 87 years old, weight-68.3kg, Crea-1.25 on 11/01/2020, Diagnosis-Afib, and last seen by Dr. Radford Pax on 08/22/20. Dose is appropriate based on dosing criteria. Will send in refill to requested pharmacy.

## 2021-03-29 ENCOUNTER — Ambulatory Visit: Payer: Medicare HMO

## 2021-04-20 ENCOUNTER — Other Ambulatory Visit: Payer: Self-pay | Admitting: Cardiology

## 2021-04-22 ENCOUNTER — Ambulatory Visit
Admission: RE | Admit: 2021-04-22 | Discharge: 2021-04-22 | Disposition: A | Discharge status: Home / Self Care | Payer: Medicare HMO | Source: Ambulatory Visit | Attending: Obstetrics & Gynecology | Admitting: Obstetrics & Gynecology

## 2021-04-22 ENCOUNTER — Other Ambulatory Visit: Payer: Self-pay

## 2021-04-22 DIAGNOSIS — Z780 Asymptomatic menopausal state: Secondary | ICD-10-CM
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
## 2021-05-06 ENCOUNTER — Inpatient Hospital Stay: Payer: Medicare HMO | Attending: Oncology | Admitting: Oncology

## 2021-05-06 ENCOUNTER — Inpatient Hospital Stay: Payer: Medicare HMO

## 2021-05-06 ENCOUNTER — Other Ambulatory Visit: Payer: Self-pay

## 2021-05-06 VITALS — BP 130/62 | HR 60 | Temp 97.4°F | Resp 18 | Wt 146.1 lb

## 2021-05-06 DIAGNOSIS — Z882 Allergy status to sulfonamides status: Secondary | ICD-10-CM | POA: Diagnosis not present

## 2021-05-06 DIAGNOSIS — Z8269 Family history of other diseases of the musculoskeletal system and connective tissue: Secondary | ICD-10-CM | POA: Insufficient documentation

## 2021-05-06 DIAGNOSIS — Z9049 Acquired absence of other specified parts of digestive tract: Secondary | ICD-10-CM | POA: Insufficient documentation

## 2021-05-06 DIAGNOSIS — Z79899 Other long term (current) drug therapy: Secondary | ICD-10-CM | POA: Insufficient documentation

## 2021-05-06 DIAGNOSIS — Z87891 Personal history of nicotine dependence: Secondary | ICD-10-CM | POA: Diagnosis not present

## 2021-05-06 DIAGNOSIS — C50212 Malignant neoplasm of upper-inner quadrant of left female breast: Secondary | ICD-10-CM

## 2021-05-06 DIAGNOSIS — Z803 Family history of malignant neoplasm of breast: Secondary | ICD-10-CM | POA: Diagnosis not present

## 2021-05-06 DIAGNOSIS — I10 Essential (primary) hypertension: Secondary | ICD-10-CM | POA: Insufficient documentation

## 2021-05-06 DIAGNOSIS — Z8711 Personal history of peptic ulcer disease: Secondary | ICD-10-CM | POA: Insufficient documentation

## 2021-05-06 DIAGNOSIS — E785 Hyperlipidemia, unspecified: Secondary | ICD-10-CM | POA: Diagnosis not present

## 2021-05-06 DIAGNOSIS — Z8261 Family history of arthritis: Secondary | ICD-10-CM | POA: Diagnosis not present

## 2021-05-06 DIAGNOSIS — M1990 Unspecified osteoarthritis, unspecified site: Secondary | ICD-10-CM | POA: Insufficient documentation

## 2021-05-06 DIAGNOSIS — R55 Syncope and collapse: Secondary | ICD-10-CM

## 2021-05-06 DIAGNOSIS — I4821 Permanent atrial fibrillation: Secondary | ICD-10-CM | POA: Insufficient documentation

## 2021-05-06 DIAGNOSIS — Z8042 Family history of malignant neoplasm of prostate: Secondary | ICD-10-CM | POA: Diagnosis not present

## 2021-05-06 DIAGNOSIS — Z823 Family history of stroke: Secondary | ICD-10-CM | POA: Insufficient documentation

## 2021-05-06 DIAGNOSIS — Z7901 Long term (current) use of anticoagulants: Secondary | ICD-10-CM | POA: Insufficient documentation

## 2021-05-06 DIAGNOSIS — Z8249 Family history of ischemic heart disease and other diseases of the circulatory system: Secondary | ICD-10-CM | POA: Diagnosis not present

## 2021-05-06 DIAGNOSIS — Z85828 Personal history of other malignant neoplasm of skin: Secondary | ICD-10-CM | POA: Diagnosis not present

## 2021-05-06 DIAGNOSIS — Z87440 Personal history of urinary (tract) infections: Secondary | ICD-10-CM | POA: Diagnosis not present

## 2021-05-06 DIAGNOSIS — Z170 Estrogen receptor positive status [ER+]: Secondary | ICD-10-CM

## 2021-05-06 DIAGNOSIS — Z79811 Long term (current) use of aromatase inhibitors: Secondary | ICD-10-CM | POA: Diagnosis not present

## 2021-05-06 LAB — CBC WITH DIFFERENTIAL/PLATELET
Abs Immature Granulocytes: 0.04 10*3/uL (ref 0.00–0.07)
Basophils Absolute: 0.1 10*3/uL (ref 0.0–0.1)
Basophils Relative: 1 %
Eosinophils Absolute: 0.3 10*3/uL (ref 0.0–0.5)
Eosinophils Relative: 3 %
HCT: 41 % (ref 36.0–46.0)
Hemoglobin: 13.8 g/dL (ref 12.0–15.0)
Immature Granulocytes: 0 %
Lymphocytes Relative: 32 %
Lymphs Abs: 3.6 10*3/uL (ref 0.7–4.0)
MCH: 31.2 pg (ref 26.0–34.0)
MCHC: 33.7 g/dL (ref 30.0–36.0)
MCV: 92.8 fL (ref 80.0–100.0)
Monocytes Absolute: 0.8 10*3/uL (ref 0.1–1.0)
Monocytes Relative: 7 %
Neutro Abs: 6.2 10*3/uL (ref 1.7–7.7)
Neutrophils Relative %: 57 %
Platelets: 225 10*3/uL (ref 150–400)
RBC: 4.42 MIL/uL (ref 3.87–5.11)
RDW: 13.1 % (ref 11.5–15.5)
WBC: 10.9 10*3/uL — ABNORMAL HIGH (ref 4.0–10.5)
nRBC: 0 % (ref 0.0–0.2)

## 2021-05-06 LAB — COMPREHENSIVE METABOLIC PANEL
ALT: 15 U/L (ref 0–44)
AST: 17 U/L (ref 15–41)
Albumin: 3.9 g/dL (ref 3.5–5.0)
Alkaline Phosphatase: 45 U/L (ref 38–126)
Anion gap: 9 (ref 5–15)
BUN: 23 mg/dL (ref 8–23)
CO2: 26 mmol/L (ref 22–32)
Calcium: 10.1 mg/dL (ref 8.9–10.3)
Chloride: 105 mmol/L (ref 98–111)
Creatinine, Ser: 1.34 mg/dL — ABNORMAL HIGH (ref 0.44–1.00)
GFR, Estimated: 39 mL/min — ABNORMAL LOW (ref >60)
Glucose, Bld: 96 mg/dL (ref 70–99)
Potassium: 4.4 mmol/L (ref 3.5–5.1)
Sodium: 140 mmol/L (ref 135–145)
Total Bilirubin: 0.8 mg/dL (ref 0.3–1.2)
Total Protein: 6.8 g/dL (ref 6.5–8.1)

## 2021-05-06 MED ORDER — IBANDRONATE SODIUM 150 MG PO TABS: 150.0000 mg | ORAL_TABLET | ORAL | 4 refills | Status: DC

## 2021-05-22 ENCOUNTER — Ambulatory Visit: Payer: Self-pay

## 2021-06-10 ENCOUNTER — Other Ambulatory Visit: Payer: Self-pay | Admitting: Cardiology

## 2021-06-13 ENCOUNTER — Other Ambulatory Visit (HOSPITAL_BASED_OUTPATIENT_CLINIC_OR_DEPARTMENT_OTHER): Payer: Self-pay

## 2021-06-13 ENCOUNTER — Other Ambulatory Visit: Payer: Self-pay

## 2021-06-13 ENCOUNTER — Ambulatory Visit: Payer: Medicare HMO | Attending: Internal Medicine

## 2021-06-13 DIAGNOSIS — Z23 Encounter for immunization: Secondary | ICD-10-CM

## 2021-06-13 MED ORDER — PFIZER COVID-19 VAC BIVALENT 30 MCG/0.3ML IM SUSP
INTRAMUSCULAR | 0 refills | Status: DC
  Filled 2021-06-13: qty 0.3, 1d supply, fill #0

## 2021-06-13 NOTE — Progress Notes (Signed)
   Covid-19 Vaccination Clinic  Name:  Mallory Stephens    MRN: [id number] DOB: 23-Jun-1933  06/13/2021  [name] was observed post Covid-19 immunization for 15 minutes without incident. She was provided with Vaccine Information Sheet and instruction to access the V-Safe system.   [name] was instructed to call 911 with any severe reactions post vaccine: Difficulty breathing  Swelling of face and throat  A fast heartbeat  A bad rash all over body  Dizziness and weakness   Immunizations Administered     Name Date Dose VIS Date Route   Pfizer Covid-19 Vaccine Bivalent Booster 06/13/2021  2:32 PM 0.3 mL 04/03/2021 Intramuscular   Manufacturer: [place]   Lot: [id number]   [place]: [phone]

## 2021-07-06 ENCOUNTER — Other Ambulatory Visit: Payer: Self-pay | Admitting: Cardiology

## 2021-07-06 DIAGNOSIS — I4821 Permanent atrial fibrillation: Secondary | ICD-10-CM

## 2021-07-08 NOTE — Telephone Encounter (Signed)
Pt last saw Dr Radford Pax 08/22/20, last labs 05/06/21 Creat 1.34, age 87, weight 66.3kg, based on specified criteria pt is on appropriate dosage of Eliquis 5mg  BID for afib.  Will refill rx.

## 2021-07-09 ENCOUNTER — Ambulatory Visit
Admission: RE | Admit: 2021-07-09 | Discharge: 2021-07-09 | Disposition: A | Discharge status: Home / Self Care | Payer: Medicare HMO | Source: Ambulatory Visit | Attending: Oncology | Admitting: Oncology

## 2021-07-09 DIAGNOSIS — C50212 Malignant neoplasm of upper-inner quadrant of left female breast: Secondary | ICD-10-CM

## 2021-07-09 DIAGNOSIS — Z170 Estrogen receptor positive status [ER+]: Secondary | ICD-10-CM

## 2021-07-16 NOTE — Progress Notes (Signed)
[place]  [phone] [phone]) [phone]     ID: Mallory Stephens DOB: 01/24/1933  MR#: [id number]  [name]  Patient Care Team: Mallory Jordan, MD as PCP - General (Family Medicine) Mallory Stephens, Mallory Dad, MD as Consulting Physician (Oncology) Mallory Rudd, MD as Consulting Physician (Radiation Oncology) Mallory Klein, MD as Consulting Physician (General Surgery) Mallory Margarita, MD as Consulting Physician (Cardiology) Mallory Kaufmann, RN as Oncology Nurse Navigator Mallory Germany, RN as Oncology Nurse Navigator Mallory Fallen, MD as Consulting Physician (Obstetrics and Gynecology) Mallory Brownie, MD as Referring Physician (Dermatology) Mallory Cruel, MD OTHER MD:  I connected with Mallory Stephens on 07/17/21 at 12:00 PM EST by video enabled telemedicine visit and verified that I am speaking with the correct person using two identifiers.   I discussed the limitations, risks, security and privacy concerns of performing an evaluation and management service by telemedicine and the availability of in-person appointments. I also discussed with the patient that there may be a patient responsible charge related to this service. The patient expressed understanding and agreed to proceed.   Other persons participating in the visit and their role in the encounter: Patient's daughter  Patients location: Home Providers location: [facility]   I provided 15 minutes of face-to-face video visit time during this encounter, and > 50% was spent counseling as documented under my assessment & plan.   CHIEF COMPLAINT: Estrogen receptor positive breast cancer  CURRENT TREATMENT: anastrozole; ibandronate   INTERVAL HISTORY: has appt w Mallory Stephens NOV 2023 Mallory Stephens was contacted today for follow up of her estrogen receptor positive breast cancer.  Her daughter participated in the visit.  Mallory Stephens started anastrozole on 10/02/2020.  She is tolerating this  with no side effects that she is aware of.  Specifically hot flashes and vaginal dryness are not an issue.  Her most recent bone density screening on 04/22/2021 showed a T-score of -2.5, indicating osteoporosis. She was started on ibandronate on 05/06/2021.  Since her last visit, she underwent bilateral diagnostic mammography with tomography at [facility] on 07/09/2021 showing: breast density category Stephens; no evidence of malignancy in either breast.    REVIEW OF SYSTEMS: Mallory Stephens went to the beach during Thanksgiving's and did a lot of walking, mostly for shopping.  A detailed review of systems today was entirely benign.   COVID 19 VACCINATION STATUS: Pfizer x4, most recently 06/2021   HISTORY OF CURRENT ILLNESS: Mallory Stephens had routine screening mammography showing a possible abnormality in the left breast. She underwent left diagnostic mammography with tomography and left breast ultrasonography at [facility] on 07/23/2020 showing: breast density category Stephens; 1.6 cm mass involving upper-inner left breast at 11 o'clock, with correlative palpable thickening; no pathologic left axillary lymphadenopathy.  Accordingly on 07/24/2020 she proceeded to biopsy of the left breast area in question. The pathology from this procedure ([id number]) showed: invasive and in situ mammary carcinoma, e-cadherin positive, grade 1-2. Prognostic indicators significant for: estrogen receptor, 95% positive and progesterone receptor, 70% positive, both with strong staining intensity. Proliferation marker Ki67 at 10%. HER2 equivocal by immunohistochemistry (2+), but negative by fluorescent in situ hybridization with a signals ratio 1.48 and number per cell 2.45.   Cancer Staging  Malignant neoplasm of upper-inner quadrant of left breast in female, estrogen receptor positive ([place]) Staging form: Breast, AJCC 8th Edition - Clinical stage from 08/23/2020: Stage IA (cT1c, cN0, cM0, G2, ER+, PR+, HER2-) - Signed by  Mallory Del  C, MD on 08/29/2020 Stage prefix: Initial diagnosis Method of lymph node assessment: Clinical  The patient's subsequent history is as detailed below.   PAST MEDICAL HISTORY: Past Medical History:  Diagnosis Date   Breast cancer ([place]) 07/24/2020   Dyslipidemia    Dysrhythmia    atrial fibrillation   Edema extremities 09/03/2016   Family history of breast cancer    Family history of prostate cancer    Fatty liver    GERD (gastroesophageal reflux disease)    History of basal cell carcinoma    Hypertension    Lymphocytosis    followed by Dr. Julien Stephens   Multinodular goiter    Osteoarthritis    Permanent atrial fibrillation (HCC)    atrial fibrillation s/p DCCV 3/10 and 6/10   PONV (postoperative nausea and vomiting)    PUD (peptic ulcer disease)    Pulmonary nodules    followed  by Dr. Stephanie Stephens   UTI (urinary tract infection) 04/2017    PAST SURGICAL HISTORY: Past Surgical History:  Procedure Laterality Date   aspiration of cyst  1978   BREAST LUMPECTOMY WITH RADIOACTIVE SEED LOCALIZATION Left 09/06/2020   Procedure: LEFT BREAST LUMPECTOMY WITH RADIOACTIVE SEED LOCALIZATION;  Surgeon: Mallory Klein, MD;  Location: [facility];  Service: General;  Laterality: Left;   [place]   right breast for benign lesion   BREAST SURGERY  1971   bilateral lumpectomy for beingn lesions   CARDIOVERSION  07/22/2011   Procedure: CARDIOVERSION;  Surgeon: Mallory Margarita, MD;  Location: [facility];  Service: Cardiovascular;  Laterality: N/A;   CARDIOVERSION  09/02/2011   Procedure: CARDIOVERSION;  Surgeon: Mallory Margarita, MD;  Location: [facility];  Service: Cardiovascular;  Laterality: N/A;   cataract     cataract removal-bilateral   CHOLECYSTECTOMY  2003   [place]   JOINT REPLACEMENT  2002   Right knee   JOINT REPLACEMENT  2007   left knee    FAMILY HISTORY: Family History  Problem Relation Age of Onset   Stroke Mother     Stroke Father    Breast cancer Sister        dx 34s   Heart disease Brother    Prostate cancer Brother        dx >50, metastatic   Prostate cancer Brother        dx >50, metastatic   Arthritis Sister    Scoliosis Sister    Breast cancer Niece        dx <50   Breast cancer Niece        dx <50   Prostate cancer Nephew        dx <50  The patient's father died in his 58s from a ruptured aneurysm.  The patient's mother died from a stroke in her 22s.  The patient has 1 cousin with breast cancer diagnosed around age 37.  She also has 4 brothers, 2 of whom had prostate cancer, and 3 sisters, 1 of whom had breast cancer diagnosed in her 9s.  There is no history of ovarian or pancreatic cancer in the family to her knowledge.    GYNECOLOGIC HISTORY:  No LMP recorded. Patient is postmenopausal. Menarche: 87 years old [place] P 0 LMP  Contraceptive more than 5 years, with no complications HRT   at least 5 years Hysterectomy? no BSO? 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  She is widowed.  She and her sister [name] share a home.    ADVANCED DIRECTIVES: The patient has named her Sister Everlene Farrier and her niece [name] as Equities trader powers of attorney.   HEALTH MAINTENANCE: Social History   Tobacco Use   Smoking status: Former    Packs/day: 0.50    Years: 10.00    Pack years: 5.00    Types: Cigarettes    Quit date: 07/16/1989    Years since quitting: 32.0   Smokeless tobacco: Never  Vaping Use   Vaping Use: Never used  Substance Use Topics   Alcohol use: Yes    Comment: occasional wine   Drug use: No     Colonoscopy: Around age 68, in [place]  PAP: Per Dr. Genia Harold  Bone density: repeat scheduled 11/2020   Allergies  Allergen Reactions   Sulfa Drugs Cross Reactors Rash    Current Outpatient Medications  Medication Sig Dispense Refill   acetaminophen (TYLENOL) 325 MG tablet Take 325 mg by mouth every 6 (six)  hours as needed for moderate pain.     amLODipine (NORVASC) 5 MG tablet TAKE 1 TABLET DAILY 90 tablet 2   anastrozole (ARIMIDEX) 1 MG tablet Take 1 tablet (1 mg total) by mouth daily. Start October 02, 2020 90 tablet 4   cholecalciferol (VITAMIN D3) 25 MCG (1000 UNIT) tablet Take 1 tablet (1,000 Units total) by mouth daily. 90 tablet 4   COVID-19 mRNA bivalent vaccine, Pfizer, (PFIZER COVID-19 VAC BIVALENT) injection Inject into the muscle. 0.3 mL 0   ELIQUIS 5 MG TABS tablet TAKE 1 TABLET TWICE A DAY 180 tablet 1   famotidine (PEPCID) 40 MG tablet Take 40 mg by mouth at bedtime.     ibandronate (BONIVA) 150 MG tablet Take 1 tablet (150 mg total) by mouth every 30 (thirty) days. Take in the morning with a full glass of water, on an empty stomach, and do not take anything else by mouth or lie down for the next 30 min. Take on the 10th day of each month. 3 tablet 4   losartan (COZAAR) 100 MG tablet TAKE 1 TABLET BY MOUTH EVERY DAY (Patient taking differently: Take 100 mg by mouth daily.) 90 tablet 1   metoprolol tartrate (LOPRESSOR) 25 MG tablet Take 1 tablet (25 mg total) by mouth 2 (two) times daily. 180 tablet 0   Multiple Vitamin (MULTIVITAMIN WITH MINERALS) TABS tablet Take 1 tablet by mouth daily.     PARoxetine (PAXIL) 10 MG tablet Take 10 mg by mouth every morning.     polyvinyl alcohol (LIQUIFILM TEARS) 1.4 % ophthalmic solution Place 1-2 drops into both eyes every 6 (six) hours as needed for dry eyes.     pravastatin (PRAVACHOL) 20 MG tablet Take 20 mg by mouth at bedtime.     solifenacin (VESICARE) 5 MG tablet Take 5 mg by mouth daily.     No current facility-administered medications for this visit.    OBJECTIVE: White woman who appears stated age  There were no vitals filed for this visit.     There is no height or weight on file to calculate BMI.   Wt Readings from Last 3 Encounters:  05/06/21 146 lb 1 oz (66.3 kg)  11/01/20 150 lb 8 oz (68.3 kg)  09/06/20 150 lb (68 kg)     ECOG  FS:  Telemedicine visit 07/17/2021     LAB RESULTS:  CMP     Component  Value Date/Time   NA 140 05/06/2021 1229   NA 141 02/22/2020 1041   K 4.4 05/06/2021 1229   CL 105 05/06/2021 1229   CO2 26 05/06/2021 1229   GLUCOSE 96 05/06/2021 1229   BUN 23 05/06/2021 1229   BUN 20 02/22/2020 1041   CREATININE 1.34 (H) 05/06/2021 1229   CREATININE 1.50 (H) 08/29/2020 1519   CREATININE 1.32 (H) 08/09/2015 1122   CALCIUM 10.1 05/06/2021 1229   PROT 6.8 05/06/2021 1229   PROT 6.5 06/01/2019 0958   ALBUMIN 3.9 05/06/2021 1229   ALBUMIN 4.2 06/01/2019 0958   AST 17 05/06/2021 1229   AST 16 08/29/2020 1519   ALT 15 05/06/2021 1229   ALT 14 08/29/2020 1519   ALKPHOS 45 05/06/2021 1229   BILITOT 0.8 05/06/2021 1229   BILITOT 0.8 08/29/2020 1519   GFRNONAA 39 (L) 05/06/2021 1229   GFRNONAA 34 (L) 08/29/2020 1519   GFRAA 44 (L) 02/22/2020 1041    No results found for: TOTALPROTELP, ALBUMINELP, A1GS, A2GS, BETS, BETA2SER, GAMS, MSPIKE, SPEI  Lab Results  Component Value Date   WBC 10.9 (H) 05/06/2021   NEUTROABS 6.2 05/06/2021   HGB 13.8 05/06/2021   HCT 41.0 05/06/2021   MCV 92.8 05/06/2021   PLT 225 05/06/2021    No results found for: LABCA2  No components found for: FMBWGY659  No results for input(s): INR in the last 168 hours.  No results found for: LABCA2  No results found for: DJT701  No results found for: XBL390  No results found for: ZES923  No results found for: CA2729  No components found for: HGQUANT  No results found for: CEA1 / No results found for: CEA1   No results found for: AFPTUMOR  No results found for: CHROMOGRNA  No results found for: KPAFRELGTCHN, LAMBDASER, KAPLAMBRATIO (kappa/lambda light chains)  No results found for: HGBA, HGBA2QUANT, HGBFQUANT, HGBSQUAN (Hemoglobinopathy evaluation)   Lab Results  Component Value Date   LDH 164 02/22/2009    No results found for: IRON, TIBC, IRONPCTSAT (Iron and TIBC)  No results found  for: FERRITIN  Urinalysis    Component Value Date/Time   COLORURINE YELLOW 04/28/2017 1310   APPEARANCEUR CLEAR 04/28/2017 1310   LABSPEC 1.011 04/28/2017 1310   PHURINE 5.0 04/28/2017 1310   GLUCOSEU NEGATIVE 04/28/2017 1310   HGBUR MODERATE (A) 04/28/2017 1310   BILIRUBINUR NEGATIVE 04/28/2017 1310   KETONESUR NEGATIVE 04/28/2017 1310   PROTEINUR NEGATIVE 04/28/2017 1310   UROBILINOGEN 0.2 06/23/2010 1322   NITRITE POSITIVE (A) 04/28/2017 1310   LEUKOCYTESUR NEGATIVE 04/28/2017 1310    STUDIES: MM DIAG BREAST TOMO BILATERAL  Result Date: 07/09/2021 CLINICAL DATA:  87 year old female status post left breast lumpectomy in December 2021. EXAM: DIGITAL DIAGNOSTIC BILATERAL MAMMOGRAM WITH TOMOSYNTHESIS AND CAD TECHNIQUE: Bilateral digital diagnostic mammography and breast tomosynthesis was performed. The images were evaluated with computer-aided detection. COMPARISON:  Previous exam(s). ACR Breast Density Category Stephens: The breast tissue is heterogeneously dense, which may obscure small masses. FINDINGS: Interval post lumpectomy changes in the central left breast. No new or suspicious findings in either breast. The parenchymal pattern is otherwise stable. IMPRESSION: 1. No mammographic evidence of malignancy in either breast. 2. Interval left breast posttreatment changes. RECOMMENDATION: Diagnostic mammogram is suggested in 1 year. (Code:[id number]-B-01Y) I have discussed the findings and recommendations with the patient. If applicable, a reminder letter will be sent to the patient regarding the next appointment. BI-RADS CATEGORY  2: Benign. Electronically Signed   By: Kristopher Oppenheim  M.D.   On: 07/09/2021 11:54     ELIGIBLE FOR AVAILABLE RESEARCH PROTOCOL: AET  ASSESSMENT: 87 y.o. [place] woman status post left breast upper inner quadrant biopsy 07/24/2020 for a clinical T1c N0, stage IA invasive ductal carcinoma, grade 1 or 2, estrogen and progesterone receptor positive, HER2 not amplified, with an  MIB-1 of 10%  (1) status post left lumpectomy 09/06/2020 for a pT1c pNX invasive ductal carcinoma, grade 2, with negative margins.  (2) opted against adjuvant radiation  (3) started anastrozole 10/02/2020  (a) bone density 04/22/2021 showed T-score of -2.5  (b) ibandronate started October 2022  (4) genetics testing 11/26/2020 through the [facility] CancerNext-Expanded + RNAinsight panel. A found no deleterious mutations in AIP, ALK, APC, ATM, AXIN2, BAP1, BARD1, BLM, BMPR1A, BRCA1, BRCA2, BRIP1, CDC73, CDH1, CDK4, CDKN1B, CDKN2A, CHEK2, CTNNA1, DICER1, FANCC, FH, FLCN, GALNT12, KIF1B, LZTR1, MAX, MEN1, MET, MLH1, MSH2, MSH3, MSH6, MUTYH, NBN, NF1, NF2, NTHL1, PALB2, PHOX2B, PMS2, POT1, PRKAR1A, PTCH1, PTEN, RAD51C, RAD51D, RB1, RECQL, RET, SDHA, SDHAF2, SDHB, SDHC, SDHD, SMAD4, SMARCA4, SMARCB1, SMARCE1, STK11, SUFU, TMEM127, TP53, TSC1, TSC2, VHL and XRCC2 (sequencing and deletion/duplication); EGFR, EGLN1, HOXB13, KIT, MITF, PDGFRA, POLD1 and POLE (sequencing only); EPCAM and GREM1 (deletion/duplication only). RNA data is routinely analyzed for use in variant interpretation for all genes.   (A) a variant of uncertain significance (VUS) was detected in the MSH6 gene called [id number] ([id number]>Stephens).    PLAN: Aryanne is a little over half a year out from definitive surgery for her breast cancer.  There is no evidence of disease activity.  This is favorable.  She is tolerating anastrozole with no significant side effects.  She does have osteoporosis but is taken ibandronate monthly with no complications.  There will see [place] again in May and then again later in the year 2023 after her next mammogram.  They know to call for any other issue that may develop before then.  Mallory Stephens. Patirica Longshore, MD 07/17/2021 11:50 AM Medical Oncology and Hematology [facility] [place], [place] [address] Tel. [phone]    Fax. [phone]   I, Wilburn Mylar, am acting as scribe for Dr.  Virgie Stephens. Kelon Easom.  I, Mallory Del MD, have reviewed the above documentation for accuracy and completeness, and I agree with the above.   *Total Encounter Time as defined by the Centers for Medicare and Medicaid Services includes, in addition to the face-to-face time of a patient visit (documented in the note above) non-face-to-face time: obtaining and reviewing outside history, ordering and reviewing medications, tests or procedures, care coordination (communications with other health care professionals or caregivers) and documentation in the medical record.

## 2021-07-17 ENCOUNTER — Inpatient Hospital Stay: Payer: Medicare HMO | Attending: Oncology | Admitting: Oncology

## 2021-07-17 DIAGNOSIS — Z87440 Personal history of urinary (tract) infections: Secondary | ICD-10-CM | POA: Diagnosis not present

## 2021-07-17 DIAGNOSIS — Z8261 Family history of arthritis: Secondary | ICD-10-CM | POA: Insufficient documentation

## 2021-07-17 DIAGNOSIS — Z882 Allergy status to sulfonamides status: Secondary | ICD-10-CM | POA: Diagnosis not present

## 2021-07-17 DIAGNOSIS — Z9049 Acquired absence of other specified parts of digestive tract: Secondary | ICD-10-CM | POA: Insufficient documentation

## 2021-07-17 DIAGNOSIS — Z823 Family history of stroke: Secondary | ICD-10-CM | POA: Insufficient documentation

## 2021-07-17 DIAGNOSIS — Z79811 Long term (current) use of aromatase inhibitors: Secondary | ICD-10-CM | POA: Diagnosis not present

## 2021-07-17 DIAGNOSIS — Z87891 Personal history of nicotine dependence: Secondary | ICD-10-CM | POA: Insufficient documentation

## 2021-07-17 DIAGNOSIS — C50212 Malignant neoplasm of upper-inner quadrant of left female breast: Secondary | ICD-10-CM | POA: Diagnosis not present

## 2021-07-17 DIAGNOSIS — Z85828 Personal history of other malignant neoplasm of skin: Secondary | ICD-10-CM | POA: Diagnosis not present

## 2021-07-17 DIAGNOSIS — Z803 Family history of malignant neoplasm of breast: Secondary | ICD-10-CM | POA: Diagnosis not present

## 2021-07-17 DIAGNOSIS — I10 Essential (primary) hypertension: Secondary | ICD-10-CM | POA: Diagnosis not present

## 2021-07-17 DIAGNOSIS — Z8249 Family history of ischemic heart disease and other diseases of the circulatory system: Secondary | ICD-10-CM | POA: Insufficient documentation

## 2021-07-17 DIAGNOSIS — E785 Hyperlipidemia, unspecified: Secondary | ICD-10-CM | POA: Insufficient documentation

## 2021-07-17 DIAGNOSIS — Z8269 Family history of other diseases of the musculoskeletal system and connective tissue: Secondary | ICD-10-CM | POA: Diagnosis not present

## 2021-07-17 DIAGNOSIS — Z79899 Other long term (current) drug therapy: Secondary | ICD-10-CM | POA: Diagnosis not present

## 2021-07-17 DIAGNOSIS — Z170 Estrogen receptor positive status [ER+]: Secondary | ICD-10-CM

## 2021-07-17 DIAGNOSIS — Z8711 Personal history of peptic ulcer disease: Secondary | ICD-10-CM | POA: Insufficient documentation

## 2021-07-17 DIAGNOSIS — Z7901 Long term (current) use of anticoagulants: Secondary | ICD-10-CM | POA: Diagnosis not present

## 2021-07-17 DIAGNOSIS — Z8042 Family history of malignant neoplasm of prostate: Secondary | ICD-10-CM | POA: Insufficient documentation

## 2021-08-19 ENCOUNTER — Other Ambulatory Visit: Payer: Self-pay

## 2021-08-19 MED ORDER — METOPROLOL TARTRATE 25 MG PO TABS: 25.0000 mg | ORAL_TABLET | Freq: Two times a day (BID) | ORAL | 0 refills | Status: DC

## 2021-08-26 ENCOUNTER — Telehealth: Payer: Self-pay | Admitting: Hematology and Oncology

## 2021-08-26 NOTE — Telephone Encounter (Signed)
Rescheduled appointment per provider. Patient is aware of updated appointment.

## 2021-09-09 NOTE — Progress Notes (Signed)
Cardiology Office Note    Date:  09/17/2021   ID:  JEANET LUPE, DOB [birth year], MRN [id number]   PCP:  [name], [place]  Cardiologist:  Fransico Him, MD   Advanced Practice Provider:  No care team member to display Electrophysiologist:  None   [phone]   Chief Complaint  Patient presents with   Follow-up    History of Present Illness:  Mallory Stephens is a 88 y.o. female   with a hx of chronic atrial fibrillation and HTN, HLD.   Patient last saw Dr. Radford Pax 08/22/20 and doing well, cleared for lumpectomy.  Patient comes in for f/u. Denies chest pain, palpitations, dyspnea, edema. No bleeding problems on Eliquis. Crt 1.34. patient didn't need chemo or radiation but on hormones and anastrozole. No regular exercise but going to go back to the [facility]. Lives with her sister. She reads a lot and does puzzles. Does PT on occasion through her insurance company.  Past Medical History:  Diagnosis Date   Breast cancer ([facility]) 07/24/2020   Dyslipidemia    Dysrhythmia    atrial fibrillation   Edema extremities 09/03/2016   Family history of breast cancer    Family history of prostate cancer    Fatty liver    GERD (gastroesophageal reflux disease)    History of basal cell carcinoma    Hypertension    Lymphocytosis    followed by Dr. Julien Nordmann   Multinodular goiter    Osteoarthritis    Permanent atrial fibrillation ([facility])    atrial fibrillation s/p DCCV 3/10 and 6/10   PONV (postoperative nausea and vomiting)    PUD (peptic ulcer disease)    Pulmonary nodules    followed  by Dr. Stephanie Acre   UTI (urinary tract infection) 04/2017    Past Surgical History:  Procedure Laterality Date   aspiration of cyst  1978   BREAST LUMPECTOMY WITH RADIOACTIVE SEED LOCALIZATION Left 09/06/2020   Procedure: LEFT BREAST LUMPECTOMY WITH RADIOACTIVE SEED LOCALIZATION;  Surgeon: [name], MD;  Location: [facility];  Service: General;  Laterality: Left;    [place]   right breast for benign lesion   BREAST SURGERY  1971   bilateral lumpectomy for beingn lesions   CARDIOVERSION  07/22/2011   Procedure: CARDIOVERSION;  Surgeon: [name], MD;  Location: [facility];  Service: Cardiovascular;  Laterality: N/A;   CARDIOVERSION  09/02/2011   Procedure: CARDIOVERSION;  Surgeon: [name], MD;  Location: [facility];  Service: Cardiovascular;  Laterality: N/A;   cataract     cataract removal-bilateral   CHOLECYSTECTOMY  2003   CYSTOSCOPY     DILATION AND CURETTAGE OF UTERUS  1979   JOINT REPLACEMENT  2002   Right knee   JOINT REPLACEMENT  2007   left knee    Current Medications: Current Meds  Medication Sig   acetaminophen (TYLENOL) 325 MG tablet Take 325 mg by mouth every 6 (six) hours as needed for moderate pain.   amLODipine (NORVASC) 5 MG tablet TAKE 1 TABLET DAILY   anastrozole (ARIMIDEX) 1 MG tablet Take 1 tablet (1 mg total) by mouth daily. Start October 02, 2020   cholecalciferol (VITAMIN D3) 25 MCG (1000 UNIT) tablet Take 1 tablet (1,000 Units total) by mouth daily.   ELIQUIS 5 MG TABS tablet TAKE 1 TABLET TWICE A DAY   famotidine (PEPCID) 40 MG tablet Take 40 mg by mouth at bedtime.   ibandronate (BONIVA)  150 MG tablet Take 1 tablet (150 mg total) by mouth every 30 (thirty) days. Take in the morning with a full glass of water, on an empty stomach, and do not take anything else by mouth or lie down for the next 30 min. Take on the 10th day of each month.   losartan (COZAAR) 100 MG tablet TAKE 1 TABLET BY MOUTH EVERY DAY   metoprolol tartrate (LOPRESSOR) 25 MG tablet Take 1 tablet (25 mg total) by mouth 2 (two) times daily.   Multiple Vitamin (MULTIVITAMIN WITH MINERALS) TABS tablet Take 1 tablet by mouth daily.   PARoxetine (PAXIL) 10 MG tablet Take 10 mg by mouth every morning.   polyvinyl alcohol (LIQUIFILM TEARS) 1.4 % ophthalmic solution Place 1-2 drops into both eyes every 6 (six) hours as needed for dry eyes.    pravastatin (PRAVACHOL) 20 MG tablet Take 20 mg by mouth at bedtime.   solifenacin (VESICARE) 5 MG tablet Take 5 mg by mouth daily.     Allergies:   Sulfa drugs cross reactors   Social History   Socioeconomic History   Marital status: Widowed    Spouse name: Not on file   Number of children: Not on file   Years of education: Not on file   Highest education level: Not on file  Occupational History   Not on file  Tobacco Use   Smoking status: Former    Packs/day: 0.50    Years: 10.00    Pack years: 5.00    Types: Cigarettes    Quit date: 07/16/1989    Years since quitting: 32.1   Smokeless tobacco: Never  Vaping Use   Vaping Use: Never used  Substance and Sexual Activity   Alcohol use: Yes    Comment: occasional wine   Drug use: No   Sexual activity: Not on file  Other Topics Concern   Not on file  Social History Narrative   Not on file   Social Determinants of Health   Financial Resource Strain: Not on file  Food Insecurity: Not on file  Transportation Needs: Not on file  Physical Activity: Not on file  Stress: Not on file  Social Connections: Not on file     Family History:  The patient's  family history includes Arthritis in her sister; Breast cancer in her niece, niece, and sister; Heart disease in her brother; Prostate cancer in her brother, brother, and nephew; Scoliosis in her sister; Stroke in her father and mother.   ROS:   Please see the history of present illness.    ROS All other systems reviewed and are negative.   PHYSICAL EXAM:   VS:  BP 132/68    Pulse 82    Ht 5\' 5"  (1.651 m)    Wt 145 lb 12.8 oz (66.1 kg)    SpO2 97%    BMI 24.26 kg/m   Physical Exam  GEN: Well nourished, well developed, in no acute distress  Neck: no JVD, carotid bruits, or masses Cardiac:RRR; no murmurs, rubs, or gallops  Respiratory:  clear to auscultation bilaterally, normal work of breathing GI: soft, nontender, nondistended, + BS Ext: without cyanosis, clubbing, or  edema, Good distal pulses bilaterally Neuro:  Alert and Oriented x 3 Psych: euthymic mood, full affect  Wt Readings from Last 3 Encounters:  09/17/21 145 lb 12.8 oz (66.1 kg)  05/06/21 146 lb 1 oz (66.3 kg)  11/01/20 150 lb 8 oz (68.3 kg)      Studies/Labs Reviewed:  EKG:  EKG is  ordered today.  The ekg ordered today demonstrates Afib 82/m  Recent Labs: 05/06/2021: ALT 15; BUN 23; Creatinine, Ser 1.34; Hemoglobin 13.8; Platelets 225; Potassium 4.4; Sodium 140   Lipid Panel    Component Value Date/Time   CHOL 133 06/01/2019 0958   TRIG 85 06/01/2019 0958   HDL 45 06/01/2019 0958   CHOLHDL 3.0 06/01/2019 0958   CHOLHDL 3 02/16/2014 0752   VLDL 22.4 02/16/2014 0752   LDLCALC 72 06/01/2019 0958    Additional studies/ records that were reviewed today include:  Echo 2018  Study Conclusions   - Left ventricle: The cavity size was normal. Systolic function was    normal. The estimated ejection fraction was in the range of 60%    to 65%. Wall motion was normal; there were no regional wall    motion abnormalities. The study is not technically sufficient to    allow evaluation of LV diastolic function.  - Aortic valve: There was mild regurgitation.  - Mitral valve: Calcified annulus. Mildly thickened leaflets .  - Left atrium: The atrium was moderately dilated.  - Right atrium: The atrium was moderately dilated.  - Atrial septum: No defect or patent foramen ovale was identified.  - Pulmonary arteries: PA peak pressure: 33 mm Hg (S).   -------------------------------------------------------------------   Risk Assessment/Calculations:    CHA2DS2-VASc Score = 4   This indicates a 4.8% annual risk of stroke. The patient's score is based upon: CHF History: 0 HTN History: 1 Diabetes History: 0 Stroke History: 0 Vascular Disease History: 0 Age Score: 2 Gender Score: 1        ASSESSMENT:    1. Permanent atrial fibrillation ([place])   2. Essential hypertension   3.  Hyperlipidemia, unspecified hyperlipidemia type      PLAN:  In order of problems listed above:  Permanent Afib on Eliquis and metoprolol echo normal 2018,  no bleeding problems, falls or palpitations. Check labs today  HTN BP controlled  HLD-LDL 103 09/2020 on Pravachol. Not fasting today. Managed by PCP  Shared Decision Making/Informed Consent        Medication Adjustments/Labs and Tests Ordered: Current medicines are reviewed at length with the patient today.  Concerns regarding medicines are outlined above.  Medication changes, Labs and Tests ordered today are listed in the Patient Instructions below. Patient Instructions  Medication Instructions:  Your physician recommends that you continue on your current medications as directed. Please refer to the Current Medication list given to you today.  *If you need a refill on your cardiac medications before your next appointment, please call your pharmacy*   Lab Work: TODAY:  BMET & CBC  If you have labs (blood work) drawn today and your tests are completely normal, you will receive your results only by: [place] (if you have MyChart) OR A paper copy in the mail If you have any lab test that is abnormal or we need to change your treatment, we will call you to review the results.   Testing/Procedures: None ordered   Follow-Up: At [facility], you and your health needs are our priority.  As part of our continuing mission to provide you with exceptional heart care, we have created designated Provider Care Teams.  These Care Teams include your primary Cardiologist (physician) and Advanced Practice Providers (APPs -  Physician Assistants and Nurse Practitioners) who all work together to provide you with the care you need, when you need it.  We recommend signing up  for the patient portal called "MyChart".  Sign up information is provided on this After Visit Summary.  MyChart is used to connect with patients for Virtual  Visits (Telemedicine).  Patients are able to view lab/test results, encounter notes, upcoming appointments, etc.  Non-urgent messages can be sent to your provider as well.   To learn more about what you can do with MyChart, go to [email or web address].    Your next appointment:   6 month(s)  The format for your next appointment:   In Person  Provider:   Fransico Him, MD    Other Instructions Your physician discussed the importance of regular exercise and recommended that you start exercising 150 minutes weekly    Signed, Ermalinda Barrios, PA-C  09/17/2021 10:21 AM    [address], [place], [place]  [address] Phone: [phone]; Fax: [phone]

## 2021-09-17 ENCOUNTER — Other Ambulatory Visit: Payer: Self-pay

## 2021-09-17 ENCOUNTER — Ambulatory Visit (INDEPENDENT_AMBULATORY_CARE_PROVIDER_SITE_OTHER): Payer: Medicare HMO | Admitting: Physician Assistant

## 2021-09-17 ENCOUNTER — Encounter: Payer: Self-pay | Admitting: Physician Assistant

## 2021-09-17 VITALS — BP 132/68 | HR 82 | Ht 65.0 in | Wt 145.8 lb

## 2021-09-17 DIAGNOSIS — I4821 Permanent atrial fibrillation: Secondary | ICD-10-CM

## 2021-09-17 DIAGNOSIS — E785 Hyperlipidemia, unspecified: Secondary | ICD-10-CM

## 2021-09-17 DIAGNOSIS — I10 Essential (primary) hypertension: Secondary | ICD-10-CM

## 2021-09-17 LAB — BASIC METABOLIC PANEL
BUN/Creatinine Ratio: 17 (ref 12–28)
BUN: 20 mg/dL (ref 8–27)
CO2: 27 mmol/L (ref 20–29)
Calcium: 10.1 mg/dL (ref 8.7–10.3)
Chloride: 104 mmol/L (ref 96–106)
Creatinine, Ser: 1.2 mg/dL — ABNORMAL HIGH (ref 0.57–1.00)
Glucose: 91 mg/dL (ref 70–99)
Potassium: 4 mmol/L (ref 3.5–5.2)
Sodium: 142 mmol/L (ref 134–144)
eGFR: 44 mL/min/{1.73_m2} — ABNORMAL LOW (ref >59)

## 2021-09-17 LAB — CBC
Hematocrit: 40.3 % (ref 34.0–46.6)
Hemoglobin: 13.3 g/dL (ref 11.1–15.9)
MCH: 30.6 pg (ref 26.6–33.0)
MCHC: 33 g/dL (ref 31.5–35.7)
MCV: 93 fL (ref 79–97)
Platelets: 247 10*3/uL (ref 150–450)
RBC: 4.34 x10E6/uL (ref 3.77–5.28)
RDW: 12.6 % (ref 11.7–15.4)
WBC: 10.4 10*3/uL (ref 3.4–10.8)

## 2021-09-17 NOTE — Patient Instructions (Addendum)
Medication Instructions:  Your physician recommends that you continue on your current medications as directed. Please refer to the Current Medication list given to you today.  *If you need a refill on your cardiac medications before your next appointment, please call your pharmacy*   Lab Work: TODAY:  BMET & CBC  If you have labs (blood work) drawn today and your tests are completely normal, you will receive your results only by: [place] (if you have MyChart) OR A paper copy in the mail If you have any lab test that is abnormal or we need to change your treatment, we will call you to review the results.   Testing/Procedures: None ordered   Follow-Up: At [facility], you and your health needs are our priority.  As part of our continuing mission to provide you with exceptional heart care, we have created designated Provider Care Teams.  These Care Teams include your primary Cardiologist (physician) and Advanced Practice Providers (APPs -  Physician Assistants and Nurse Practitioners) who all work together to provide you with the care you need, when you need it.  We recommend signing up for the patient portal called "MyChart".  Sign up information is provided on this After Visit Summary.  MyChart is used to connect with patients for Virtual Visits (Telemedicine).  Patients are able to view lab/test results, encounter notes, upcoming appointments, etc.  Non-urgent messages can be sent to your provider as well.   To learn more about what you can do with MyChart, go to [email or web address].    Your next appointment:   6 month(s)  The format for your next appointment:   In Person  Provider:   Fransico Him, MD    Other Instructions Your physician discussed the importance of regular exercise and recommended that you start exercising 150 minutes weekly

## 2021-10-05 ENCOUNTER — Other Ambulatory Visit: Payer: Self-pay | Admitting: Cardiology

## 2021-10-07 ENCOUNTER — Other Ambulatory Visit: Payer: Self-pay

## 2021-10-07 MED ORDER — ANASTROZOLE 1 MG PO TABS: 1.0000 mg | ORAL_TABLET | Freq: Every day | ORAL | 3 refills | Status: DC

## 2021-12-10 ENCOUNTER — Encounter (HOSPITAL_COMMUNITY): Payer: Self-pay

## 2022-01-20 ENCOUNTER — Other Ambulatory Visit: Payer: Self-pay

## 2022-01-20 MED ORDER — AMLODIPINE BESYLATE 5 MG PO TABS: 5.0000 mg | ORAL_TABLET | Freq: Every day | ORAL | 0 refills | Status: DC

## 2022-01-27 ENCOUNTER — Other Ambulatory Visit: Payer: Self-pay | Admitting: Cardiology

## 2022-01-27 ENCOUNTER — Other Ambulatory Visit (HOSPITAL_COMMUNITY): Payer: Self-pay | Admitting: Family Medicine

## 2022-01-27 DIAGNOSIS — I4821 Permanent atrial fibrillation: Secondary | ICD-10-CM

## 2022-01-27 DIAGNOSIS — I5043 Acute on chronic combined systolic (congestive) and diastolic (congestive) heart failure: Secondary | ICD-10-CM

## 2022-01-28 NOTE — Telephone Encounter (Signed)
Prescription refill request for Eliquis received. Indication:Afib Last office visit:2/23 Scr:1.2 Age: 88 Weight:66.1 kg  Prescription refilled

## 2022-02-07 ENCOUNTER — Ambulatory Visit (HOSPITAL_COMMUNITY): Payer: Medicare HMO | Attending: Cardiology

## 2022-02-07 DIAGNOSIS — I5043 Acute on chronic combined systolic (congestive) and diastolic (congestive) heart failure: Secondary | ICD-10-CM

## 2022-02-07 LAB — ECHOCARDIOGRAM COMPLETE
P 1/2 time: 491 msec
S' Lateral: 3.1 cm

## 2022-04-06 ENCOUNTER — Emergency Department (HOSPITAL_BASED_OUTPATIENT_CLINIC_OR_DEPARTMENT_OTHER)
Admission: EM | Admit: 2022-04-06 | Discharge: 2022-04-06 | Disposition: A | Discharge status: Home / Self Care | Payer: Medicare HMO | Attending: Emergency Medicine | Admitting: Emergency Medicine

## 2022-04-06 ENCOUNTER — Emergency Department (HOSPITAL_BASED_OUTPATIENT_CLINIC_OR_DEPARTMENT_OTHER): Payer: Medicare HMO

## 2022-04-06 ENCOUNTER — Encounter (HOSPITAL_BASED_OUTPATIENT_CLINIC_OR_DEPARTMENT_OTHER): Payer: Self-pay | Admitting: Emergency Medicine

## 2022-04-06 ENCOUNTER — Other Ambulatory Visit: Payer: Self-pay

## 2022-04-06 DIAGNOSIS — K5730 Diverticulosis of large intestine without perforation or abscess without bleeding: Secondary | ICD-10-CM | POA: Insufficient documentation

## 2022-04-06 DIAGNOSIS — R1032 Left lower quadrant pain: Secondary | ICD-10-CM

## 2022-04-06 DIAGNOSIS — Z7901 Long term (current) use of anticoagulants: Secondary | ICD-10-CM | POA: Insufficient documentation

## 2022-04-06 LAB — COMPREHENSIVE METABOLIC PANEL
ALT: 10 U/L (ref 0–44)
AST: 16 U/L (ref 15–41)
Albumin: 4.6 g/dL (ref 3.5–5.0)
Alkaline Phosphatase: 38 U/L (ref 38–126)
Anion gap: 13 (ref 5–15)
BUN: 20 mg/dL (ref 8–23)
CO2: 25 mmol/L (ref 22–32)
Calcium: 9.9 mg/dL (ref 8.9–10.3)
Chloride: 101 mmol/L (ref 98–111)
Creatinine, Ser: 1.14 mg/dL — ABNORMAL HIGH (ref 0.44–1.00)
GFR, Estimated: 47 mL/min — ABNORMAL LOW (ref >60)
Glucose, Bld: 112 mg/dL — ABNORMAL HIGH (ref 70–99)
Potassium: 3.4 mmol/L — ABNORMAL LOW (ref 3.5–5.1)
Sodium: 139 mmol/L (ref 135–145)
Total Bilirubin: 1.2 mg/dL (ref 0.3–1.2)
Total Protein: 7.6 g/dL (ref 6.5–8.1)

## 2022-04-06 LAB — CBC
HCT: 41.1 % (ref 36.0–46.0)
Hemoglobin: 13.8 g/dL (ref 12.0–15.0)
MCH: 30 pg (ref 26.0–34.0)
MCHC: 33.6 g/dL (ref 30.0–36.0)
MCV: 89.3 fL (ref 80.0–100.0)
Platelets: 242 10*3/uL (ref 150–400)
RBC: 4.6 MIL/uL (ref 3.87–5.11)
RDW: 13.2 % (ref 11.5–15.5)
WBC: 16 10*3/uL — ABNORMAL HIGH (ref 4.0–10.5)
nRBC: 0 % (ref 0.0–0.2)

## 2022-04-06 LAB — URINALYSIS, ROUTINE W REFLEX MICROSCOPIC
Bilirubin Urine: NEGATIVE
Glucose, UA: NEGATIVE mg/dL
Ketones, ur: NEGATIVE mg/dL
Nitrite: NEGATIVE
Protein, ur: 30 mg/dL — AB
Specific Gravity, Urine: 1.022 (ref 1.005–1.030)
pH: 5.5 (ref 5.0–8.0)

## 2022-04-06 LAB — LIPASE, BLOOD: Lipase: 28 U/L (ref 11–51)

## 2022-04-06 MED ORDER — IOHEXOL 300 MG/ML  SOLN
100.0000 mL | Freq: Once | INTRAMUSCULAR | Status: AC | PRN
  Administered 2022-04-06: 80 mL via INTRAVENOUS

## 2022-04-06 MED ORDER — SODIUM CHLORIDE 0.9 % IV SOLN: INTRAVENOUS | Status: DC

## 2022-04-06 NOTE — ED Triage Notes (Signed)
For about a week patient has had left side flank pain, intermittent, as well as constipated and some nausea. Had 2 enemas yesterday that relieved nausea and had small ,liquid stool. Still has left side flank pain.

## 2022-04-06 NOTE — ED Notes (Signed)
Patient returned from Radiology. 

## 2022-04-06 NOTE — ED Notes (Signed)
Dc instructions reviewed with patient. Patient voiced understanding. Dc with belongings.  °

## 2022-04-06 NOTE — Discharge Instructions (Signed)
Recommend extra strength Tylenol.  Make an appointment follow-up with your doctor.  CT scan without any acute findings no evidence of diverticulitis.  No evidence of any hernia.  No evidence of any constipation.  Return for any new or worse symptoms.

## 2022-04-06 NOTE — ED Provider Notes (Signed)
[facility] EMERGENCY DEPT Provider Note   CSN: [id number] Arrival date & time: 04/06/22  1037     History  Chief Complaint  Patient presents with   Flank Pain    Mallory Stephens is a 88 y.o. female.  Patient with a complaint of left lower quadrant abdominal pain thought it was constipation its been going on for about 2 weeks.  Patient's had diverticulitis before.  Associated with some nausea and vomiting but not frequently.  Patient was seen by her OB in the says she had a large amount of stool.       Home Medications Prior to Admission medications   Medication Sig Start Date End Date Taking? Authorizing Provider  acetaminophen (TYLENOL) 325 MG tablet Take 325 mg by mouth every 6 (six) hours as needed for moderate pain.    [provider]  amLODipine (NORVASC) 5 MG tablet Take 1 tablet (5 mg total) by mouth daily. 01/20/22   Imogene Burn, PA-C  anastrozole (ARIMIDEX) 1 MG tablet Take 1 tablet (1 mg total) by mouth daily. 10/07/21   Nicholas Lose, MD  cholecalciferol (VITAMIN D3) 25 MCG (1000 UNIT) tablet Take 1 tablet (1,000 Units total) by mouth daily. 11/01/20   Magrinat, Virgie Dad, MD  ELIQUIS 5 MG TABS tablet TAKE 1 TABLET TWICE A DAY 01/28/22   [name], MD  famotidine (PEPCID) 40 MG tablet Take 40 mg by mouth at bedtime. 12/29/18   [provider]  ibandronate (BONIVA) 150 MG tablet Take 1 tablet (150 mg total) by mouth every 30 (thirty) days. Take in the morning with a full glass of water, on an empty stomach, and do not take anything else by mouth or lie down for the next 30 min. Take on the 10th day of each month. 05/06/21   Magrinat, Virgie Dad, MD  losartan (COZAAR) 100 MG tablet TAKE 1 TABLET BY MOUTH EVERY DAY 07/09/15   [name], MD  metoprolol tartrate (LOPRESSOR) 25 MG tablet TAKE 1 TABLET TWICE A DAY 10/07/21   [name], MD  Multiple Vitamin (MULTIVITAMIN WITH MINERALS) TABS tablet Take 1 tablet by mouth daily.     [provider]  PARoxetine (PAXIL) 10 MG tablet Take 10 mg by mouth every morning.    [provider]  polyvinyl alcohol (LIQUIFILM TEARS) 1.4 % ophthalmic solution Place 1-2 drops into both eyes every 6 (six) hours as needed for dry eyes.    [provider]  pravastatin (PRAVACHOL) 20 MG tablet Take 20 mg by mouth at bedtime.    [provider]  solifenacin (VESICARE) 5 MG tablet Take 5 mg by mouth daily.    [provider]      Allergies    Sulfa drugs cross reactors    Review of Systems   Review of Systems  Constitutional:  Negative for chills and fever.  HENT:  Negative for ear pain and sore throat.   Eyes:  Negative for pain and visual disturbance.  Respiratory:  Negative for cough and shortness of breath.   Cardiovascular:  Negative for chest pain and palpitations.  Gastrointestinal:  Positive for abdominal pain, nausea and vomiting.  Genitourinary:  Negative for dysuria and hematuria.  Musculoskeletal:  Negative for arthralgias and back pain.  Skin:  Negative for color change and rash.  Neurological:  Negative for seizures and syncope.  All other systems reviewed and are negative.   Physical Exam Updated Vital Signs BP (!) 150/80  Pulse 83   Temp 98.1 F (36.7 C) (Oral)   Resp 20   SpO2 100%  Physical Exam Vitals and nursing note reviewed.  Constitutional:      General: She is not in acute distress.    Appearance: Normal appearance. She is well-developed.  HENT:     Head: Normocephalic and atraumatic.     Mouth/Throat:     Mouth: Mucous membranes are moist.  Eyes:     Extraocular Movements: Extraocular movements intact.     Conjunctiva/sclera: Conjunctivae normal.     Pupils: Pupils are equal, round, and reactive to light.  Cardiovascular:     Rate and Rhythm: Normal rate and regular rhythm.     Heart sounds: No murmur heard. Pulmonary:     Effort: Pulmonary effort is normal. No respiratory distress.     Breath  sounds: Normal breath sounds.  Abdominal:     Palpations: Abdomen is soft.     Tenderness: There is abdominal tenderness. There is no guarding.     Comments: Patient with some mild tenderness left lower quadrant.  No guarding.  Musculoskeletal:        General: No swelling.     Cervical back: Normal range of motion and neck supple.  Skin:    General: Skin is warm and dry.     Capillary Refill: Capillary refill takes less than 2 seconds.  Neurological:     General: No focal deficit present.     Mental Status: She is alert and oriented to person, place, and time.     Cranial Nerves: No cranial nerve deficit.     Sensory: No sensory deficit.     Motor: No weakness.  Psychiatric:        Mood and Affect: Mood normal.     ED Results / Procedures / Treatments   Labs (all labs ordered are listed, but only abnormal results are displayed) Labs Reviewed  LIPASE, BLOOD  COMPREHENSIVE METABOLIC PANEL  CBC  URINALYSIS, ROUTINE W REFLEX MICROSCOPIC    EKG None  Radiology No results found.  Procedures Procedures    Medications Ordered in ED Medications - No data to display  ED Course/ Medical Decision Making/ A&P                           Medical Decision Making Amount and/or Complexity of Data Reviewed Labs: ordered. Radiology: ordered.  Risk Prescription drug management.   Work-up lipase normal complete metabolic panel significant for some mild tenderness left lower quadrant.  No guarding.  Lab work-up significant for urinalysis being negative white count of 16,000 and electrolytes with some renal insufficiency and mild hypokalemia with potassium of 3.4.  CT scan of the abdomen diverticulosis descending and sigmoid colon without any inflammation no acute abnormality seen in the abdomen or pelvis no significant stool burden.  No evidence of any hernia.  No acute findings to explain patient's symptoms.  Will treat symptomatically and have her follow-up with her primary  care doctor.  Returning for any new or worse symptoms. Final Clinical Impression(s) / ED Diagnoses Final diagnoses:  None    Rx / DC Orders ED Discharge Orders     None         Fredia Sorrow, MD 04/06/22 1428

## 2022-04-06 NOTE — ED Triage Notes (Signed)
Pt saw OB this week thinking it was a cyst on her ovary but ultrasound showed large stool burden.

## 2022-06-04 ENCOUNTER — Other Ambulatory Visit: Payer: Self-pay | Admitting: *Deleted

## 2022-06-04 MED ORDER — IBANDRONATE SODIUM 150 MG PO TABS: 150.0000 mg | ORAL_TABLET | ORAL | 4 refills | Status: DC

## 2022-06-15 NOTE — Progress Notes (Signed)
Patient Care Team: [name], MD as PCP - General (Family Medicine) [name], MD as PCP - Cardiology (Cardiology) Magrinat, Virgie Dad, MD (Inactive) as Consulting Physician (Oncology) [name], MD as Consulting Physician (Radiation Oncology) [name], MD as Consulting Physician (General Surgery) [name], MD as Consulting Physician (Cardiology) [name], RN as Oncology Nurse Navigator [name], RN as Oncology Nurse Navigator [name], MD as Consulting Physician (Obstetrics and Gynecology) [name], MD as Referring Physician (Dermatology)  DIAGNOSIS:  Encounter Diagnosis  Name Primary?   Malignant neoplasm of upper-inner quadrant of left breast in female, estrogen receptor positive ([place]) Yes    SUMMARY OF ONCOLOGIC HISTORY: Oncology History  Malignant neoplasm of upper-inner quadrant of left breast in female, estrogen receptor positive ([place])  08/20/2020 Initial Diagnosis   Malignant neoplasm of upper-inner quadrant of left breast in female, estrogen receptor positive ([place])   08/23/2020 Cancer Staging   Staging form: Breast, AJCC 8th Edition - Clinical stage from 08/23/2020: Stage IA (cT1c, cN0, cM0, G2, ER+, PR+, HER2-) - Signed by [name], MD on 08/29/2020   11/26/2020 Genetic Testing   Negative genetic testing:  No pathogenic variants detected on the [employer] CancerNext-Expanded + RNAinsight panel. A variant of uncertain significance (VUS) was detected in the MSH6 gene called p.[id number] (c.3930G>C). The report date is 11/26/2020.  The CancerNext-Expanded + RNAinsight gene panel offered by [employer] and includes sequencing and rearrangement analysis for the following 77 genes: AIP, ALK, APC, ATM, AXIN2, BAP1, BARD1, BLM, BMPR1A, BRCA1, BRCA2, BRIP1, CDC73, CDH1, CDK4, CDKN1B, CDKN2A, CHEK2, CTNNA1, DICER1, FANCC, FH, FLCN, GALNT12, KIF1B, LZTR1, MAX, MEN1, MET, MLH1, MSH2, MSH3, MSH6, MUTYH, NBN, NF1, NF2, NTHL1,  PALB2, PHOX2B, PMS2, POT1, PRKAR1A, PTCH1, PTEN, RAD51C, RAD51D, RB1, RECQL, RET, SDHA, SDHAF2, SDHB, SDHC, SDHD, SMAD4, SMARCA4, SMARCB1, SMARCE1, STK11, SUFU, TMEM127, TP53, TSC1, TSC2, VHL and XRCC2 (sequencing and deletion/duplication); EGFR, EGLN1, HOXB13, KIT, MITF, PDGFRA, POLD1 and POLE (sequencing only); EPCAM and GREM1 (deletion/duplication only). RNA data is routinely analyzed for use in variant interpretation for all genes.      CHIEF COMPLIANT: Follow-up Estrogen receptor positive breast cancer    INTERVAL HISTORY: Mallory Stephens is a 88 y.o with the above-mentioned Estrogen receptor positive breast cancer. Currently on anastrozole. She presents to the clinic for a follow-up and Establish oncology care with Dr. Lindi Adie. She reports no side effects or symptoms. She complains of hair thinning. She says it is not as bad right now as it has been. Denies hot flashes and joint stiffness. She does not stay active as much because she had a knee replacement. She says she went to physical therapy to work on her balance.       ALLERGIES:  is allergic to sulfa drugs cross reactors.  MEDICATIONS:  Current Outpatient Medications  Medication Sig Dispense Refill   acetaminophen (TYLENOL) 325 MG tablet Take 325 mg by mouth every 6 (six) hours as needed for moderate pain.     amLODipine (NORVASC) 5 MG tablet Take 1 tablet (5 mg total) by mouth daily. 90 tablet 0   anastrozole (ARIMIDEX) 1 MG tablet Take 1 tablet (1 mg total) by mouth daily. 90 tablet 3   cholecalciferol (VITAMIN D3) 25 MCG (1000 UNIT) tablet Take 1 tablet (1,000 Units total) by mouth daily. 90 tablet 4   ELIQUIS 5 MG TABS tablet TAKE 1 TABLET TWICE A DAY 180 tablet 1   famotidine (PEPCID) 40 MG tablet Take 40 mg by  mouth at bedtime.     ibandronate (BONIVA) 150 MG tablet Take 1 tablet (150 mg total) by mouth every 30 (thirty) days. Take in the morning with a full glass of water, on an empty stomach, and do not take anything else  by mouth or lie down for the next 30 min. Take on the 10th day of each month. 3 tablet 4   losartan (COZAAR) 100 MG tablet TAKE 1 TABLET BY MOUTH EVERY DAY 90 tablet 1   metoprolol tartrate (LOPRESSOR) 25 MG tablet TAKE 1 TABLET TWICE A DAY 180 tablet 3   Multiple Vitamin (MULTIVITAMIN WITH MINERALS) TABS tablet Take 1 tablet by mouth daily.     PARoxetine (PAXIL) 10 MG tablet Take 10 mg by mouth every morning.     polyvinyl alcohol (LIQUIFILM TEARS) 1.4 % ophthalmic solution Place 1-2 drops into both eyes every 6 (six) hours as needed for dry eyes.     pravastatin (PRAVACHOL) 20 MG tablet Take 20 mg by mouth at bedtime.     solifenacin (VESICARE) 5 MG tablet Take 5 mg by mouth daily.     No current facility-administered medications for this visit.    PHYSICAL EXAMINATION: ECOG PERFORMANCE STATUS: 1 - Symptomatic but completely ambulatory  Vitals:   06/17/22 1138  BP: 120/79  Pulse: 78  Resp: 18  Temp: (!) 97.2 F (36.2 C)  SpO2: 97%   Filed Weights   06/17/22 1138  Weight: 143 lb (64.9 kg)      LABORATORY DATA:  I have reviewed the data as listed    Latest Ref Rng & Units 06/17/2022   11:25 AM 04/06/2022   11:19 AM 09/17/2021   10:24 AM  CMP  Glucose 70 - 99 mg/dL 94  112  91   BUN 8 - 23 mg/dL 26  20  20    Creatinine 0.44 - 1.00 mg/dL 1.15  1.14  1.20   Sodium 135 - 145 mmol/L 139  139  142   Potassium 3.5 - 5.1 mmol/L 4.0  3.4  4.0   Chloride 98 - 111 mmol/L 105  101  104   CO2 22 - 32 mmol/L 29  25  27    Calcium 8.9 - 10.3 mg/dL 9.7  9.9  10.1   Total Protein 6.5 - 8.1 g/dL 6.8  7.6    Total Bilirubin 0.3 - 1.2 mg/dL 0.8  1.2    Alkaline Phos 38 - 126 U/L 39  38    AST 15 - 41 U/L 13  16    ALT 0 - 44 U/L 8  10      Lab Results  Component Value Date   WBC 10.0 06/17/2022   HGB 12.0 06/17/2022   HCT 34.8 (L) 06/17/2022   MCV 91.1 06/17/2022   PLT 231 06/17/2022   NEUTROABS 5.5 06/17/2022    ASSESSMENT & PLAN:  Malignant neoplasm of upper-inner quadrant  of left breast in female, estrogen receptor positive ([place]) Dr. Jana Hakim patient to establish oncology care with me 07/24/2020: Left breast UIQ T1CN0 stage Ia grade 2 IDC ER/PR positive HER2 negative Ki-67 10% 09/06/2020: Left lumpectomy: T1CNX grade 2 IDC with negative margins, opted against radiation 11/26/2020: Genetics: Negative  Current treatment: Anastrozole started 10/02/2020 Anastrozole toxicities: Osteoporosis: T score -2.5 on 04/22/2021 currently on ibandronate  Breast cancer surveillance: Breast exam 06/17/2022: Benign Mammogram 07/09/2021: Benign breast density category C CT AP for abdominal pain: 04/06/2022: Diverticulosis  Return to clinic in 1 year for follow-up  Orders Placed This Encounter  Procedures   MM DIAG BREAST TOMO BILATERAL    Standing Status:   Future    Standing Expiration Date:   06/18/2023    Order Specific Question:   Reason for Exam (SYMPTOM  OR DIAGNOSIS REQUIRED)    Answer:   annual follow-up    Order Specific Question:   Preferred imaging location?    Answer:   [facility]   The patient has a good understanding of the overall plan. she agrees with it. she will call with any problems that may develop before the next visit here. Total time spent: 30 mins including face to face time and time spent for planning, charting and co-ordination of care   Harriette Ohara, MD 06/17/22    I Gardiner Coins am scribing for Dr. Lindi Adie  I have reviewed the above documentation for accuracy and completeness, and I agree with the above.

## 2022-06-16 ENCOUNTER — Other Ambulatory Visit: Payer: Self-pay | Admitting: *Deleted

## 2022-06-16 DIAGNOSIS — Z170 Estrogen receptor positive status [ER+]: Secondary | ICD-10-CM

## 2022-06-17 ENCOUNTER — Ambulatory Visit: Payer: Medicare HMO | Admitting: Hematology and Oncology

## 2022-06-17 ENCOUNTER — Inpatient Hospital Stay: Payer: Medicare HMO | Attending: Oncology

## 2022-06-17 ENCOUNTER — Other Ambulatory Visit: Payer: Medicare HMO

## 2022-06-17 ENCOUNTER — Other Ambulatory Visit: Payer: Self-pay

## 2022-06-17 ENCOUNTER — Inpatient Hospital Stay (HOSPITAL_BASED_OUTPATIENT_CLINIC_OR_DEPARTMENT_OTHER): Payer: Medicare HMO | Admitting: Hematology and Oncology

## 2022-06-17 VITALS — BP 120/79 | HR 78 | Temp 97.2°F | Resp 18 | Ht 65.0 in | Wt 143.0 lb

## 2022-06-17 DIAGNOSIS — M810 Age-related osteoporosis without current pathological fracture: Secondary | ICD-10-CM | POA: Insufficient documentation

## 2022-06-17 DIAGNOSIS — Z79811 Long term (current) use of aromatase inhibitors: Secondary | ICD-10-CM | POA: Insufficient documentation

## 2022-06-17 DIAGNOSIS — Z882 Allergy status to sulfonamides status: Secondary | ICD-10-CM | POA: Diagnosis not present

## 2022-06-17 DIAGNOSIS — Z79899 Other long term (current) drug therapy: Secondary | ICD-10-CM | POA: Diagnosis not present

## 2022-06-17 DIAGNOSIS — Z170 Estrogen receptor positive status [ER+]: Secondary | ICD-10-CM

## 2022-06-17 DIAGNOSIS — Z7901 Long term (current) use of anticoagulants: Secondary | ICD-10-CM | POA: Diagnosis not present

## 2022-06-17 DIAGNOSIS — C50212 Malignant neoplasm of upper-inner quadrant of left female breast: Secondary | ICD-10-CM | POA: Diagnosis not present

## 2022-06-17 LAB — CBC WITH DIFFERENTIAL (CANCER CENTER ONLY)
Abs Immature Granulocytes: 0.03 10*3/uL (ref 0.00–0.07)
Basophils Absolute: 0.1 10*3/uL (ref 0.0–0.1)
Basophils Relative: 1 %
Eosinophils Absolute: 0.3 10*3/uL (ref 0.0–0.5)
Eosinophils Relative: 3 %
HCT: 34.8 % — ABNORMAL LOW (ref 36.0–46.0)
Hemoglobin: 12 g/dL (ref 12.0–15.0)
Immature Granulocytes: 0 %
Lymphocytes Relative: 34 %
Lymphs Abs: 3.4 10*3/uL (ref 0.7–4.0)
MCH: 31.4 pg (ref 26.0–34.0)
MCHC: 34.5 g/dL (ref 30.0–36.0)
MCV: 91.1 fL (ref 80.0–100.0)
Monocytes Absolute: 0.7 10*3/uL (ref 0.1–1.0)
Monocytes Relative: 7 %
Neutro Abs: 5.5 10*3/uL (ref 1.7–7.7)
Neutrophils Relative %: 55 %
Platelet Count: 231 10*3/uL (ref 150–400)
RBC: 3.82 MIL/uL — ABNORMAL LOW (ref 3.87–5.11)
RDW: 13.2 % (ref 11.5–15.5)
WBC Count: 10 10*3/uL (ref 4.0–10.5)
nRBC: 0 % (ref 0.0–0.2)

## 2022-06-17 LAB — CMP (CANCER CENTER ONLY)
ALT: 8 U/L (ref 0–44)
AST: 13 U/L — ABNORMAL LOW (ref 15–41)
Albumin: 4.2 g/dL (ref 3.5–5.0)
Alkaline Phosphatase: 39 U/L (ref 38–126)
Anion gap: 5 (ref 5–15)
BUN: 26 mg/dL — ABNORMAL HIGH (ref 8–23)
CO2: 29 mmol/L (ref 22–32)
Calcium: 9.7 mg/dL (ref 8.9–10.3)
Chloride: 105 mmol/L (ref 98–111)
Creatinine: 1.15 mg/dL — ABNORMAL HIGH (ref 0.44–1.00)
GFR, Estimated: 46 mL/min — ABNORMAL LOW (ref >60)
Glucose, Bld: 94 mg/dL (ref 70–99)
Potassium: 4 mmol/L (ref 3.5–5.1)
Sodium: 139 mmol/L (ref 135–145)
Total Bilirubin: 0.8 mg/dL (ref 0.3–1.2)
Total Protein: 6.8 g/dL (ref 6.5–8.1)

## 2022-06-17 MED ORDER — ANASTROZOLE 1 MG PO TABS: 1.0000 mg | ORAL_TABLET | Freq: Every day | ORAL | 3 refills | Status: DC

## 2022-06-17 NOTE — Assessment & Plan Note (Signed)
Dr. Jana Hakim patient to establish oncology care with me 07/24/2020: Left breast UIQ T1CN0 stage Ia grade 2 IDC ER/PR positive HER2 negative Ki-67 10% 09/06/2020: Left lumpectomy: T1CNX grade 2 IDC with negative margins, opted against radiation 11/26/2020: Genetics: Negative  Current treatment: Anastrozole started 10/02/2020 Anastrozole toxicities: Osteoporosis: T score -2.5 on 04/22/2021 currently on ibandronate  Breast cancer surveillance: Breast exam 06/17/2022: Benign Mammogram 07/09/2021: Benign breast density category C CT AP for abdominal pain: 04/06/2022: Diverticulosis  Return to clinic in 1 year for follow-up

## 2022-06-29 ENCOUNTER — Emergency Department (HOSPITAL_BASED_OUTPATIENT_CLINIC_OR_DEPARTMENT_OTHER): Payer: Medicare HMO

## 2022-06-29 ENCOUNTER — Emergency Department (HOSPITAL_BASED_OUTPATIENT_CLINIC_OR_DEPARTMENT_OTHER)
Admission: EM | Admit: 2022-06-29 | Discharge: 2022-06-29 | Disposition: A | Discharge status: Home / Self Care | Payer: Medicare HMO | Attending: Emergency Medicine | Admitting: Emergency Medicine

## 2022-06-29 ENCOUNTER — Other Ambulatory Visit: Payer: Self-pay

## 2022-06-29 DIAGNOSIS — S59912A Unspecified injury of left forearm, initial encounter: Secondary | ICD-10-CM | POA: Diagnosis present

## 2022-06-29 DIAGNOSIS — W010XXA Fall on same level from slipping, tripping and stumbling without subsequent striking against object, initial encounter: Secondary | ICD-10-CM | POA: Diagnosis not present

## 2022-06-29 DIAGNOSIS — Z79899 Other long term (current) drug therapy: Secondary | ICD-10-CM | POA: Diagnosis not present

## 2022-06-29 DIAGNOSIS — S0990XA Unspecified injury of head, initial encounter: Secondary | ICD-10-CM | POA: Diagnosis not present

## 2022-06-29 DIAGNOSIS — I10 Essential (primary) hypertension: Secondary | ICD-10-CM | POA: Insufficient documentation

## 2022-06-29 DIAGNOSIS — W19XXXA Unspecified fall, initial encounter: Secondary | ICD-10-CM

## 2022-06-29 DIAGNOSIS — Y92512 Supermarket, store or market as the place of occurrence of the external cause: Secondary | ICD-10-CM | POA: Diagnosis not present

## 2022-06-29 DIAGNOSIS — Z7901 Long term (current) use of anticoagulants: Secondary | ICD-10-CM | POA: Insufficient documentation

## 2022-06-29 DIAGNOSIS — S51812A Laceration without foreign body of left forearm, initial encounter: Secondary | ICD-10-CM | POA: Diagnosis not present

## 2022-06-29 NOTE — ED Notes (Signed)
Left forearm skin tear bandaged with bacitracin ointment, non adhesive gauze, and coband

## 2022-06-29 NOTE — ED Triage Notes (Incomplete)
Patient arrives with complaint shaving a mechanical fall. Patient fell and hit her head. She s

## 2022-06-29 NOTE — ED Provider Notes (Signed)
[facility] EMERGENCY DEPT Provider Note   CSN: [id number] Arrival date & time: 06/29/22  1214     History  Chief Complaint  Patient presents with   Mallory Stephens is a 88 y.o. female.   Fall     88 year old female with medical history significant for HLD, osteoarthritis, PUD, GERD, HTN, atrial fibrillation on anticoagulation who presents emerged department after a fall.  The patient states that she tripped while at the grocery store and landed on her head on the back of her head.  She also sustained a skin tear along the left forearm.  She states that her tetanus is up-to-date.  She denies any loss of consciousness.  She denies any headache, blurry vision, nausea or vomiting, neurologic deficit.  She arrives to the emergency department GCS 15, ABC intact.  Home Medications Prior to Admission medications   Medication Sig Start Date End Date Taking? Authorizing Provider  acetaminophen (TYLENOL) 325 MG tablet Take 325 mg by mouth every 6 (six) hours as needed for moderate pain.    [provider]  amLODipine (NORVASC) 5 MG tablet Take 1 tablet (5 mg total) by mouth daily. 01/20/22   Imogene Burn, PA-C  anastrozole (ARIMIDEX) 1 MG tablet Take 1 tablet (1 mg total) by mouth daily. 06/17/22   Nicholas Lose, MD  cholecalciferol (VITAMIN D3) 25 MCG (1000 UNIT) tablet Take 1 tablet (1,000 Units total) by mouth daily. 11/01/20   Magrinat, Virgie Dad, MD  ELIQUIS 5 MG TABS tablet TAKE 1 TABLET TWICE A DAY 01/28/22   [name], MD  famotidine (PEPCID) 40 MG tablet Take 40 mg by mouth at bedtime. 12/29/18   [provider]  ibandronate (BONIVA) 150 MG tablet Take 1 tablet (150 mg total) by mouth every 30 (thirty) days. Take in the morning with a full glass of water, on an empty stomach, and do not take anything else by mouth or lie down for the next 30 min. Take on the 10th day of each month. 06/04/22   Nicholas Lose, MD  losartan (COZAAR) 100 MG  tablet TAKE 1 TABLET BY MOUTH EVERY DAY 07/09/15   [name], MD  metoprolol tartrate (LOPRESSOR) 25 MG tablet TAKE 1 TABLET TWICE A DAY 10/07/21   [name], MD  Multiple Vitamin (MULTIVITAMIN WITH MINERALS) TABS tablet Take 1 tablet by mouth daily.    [provider]  PARoxetine (PAXIL) 10 MG tablet Take 10 mg by mouth every morning.    [provider]  polyvinyl alcohol (LIQUIFILM TEARS) 1.4 % ophthalmic solution Place 1-2 drops into both eyes every 6 (six) hours as needed for dry eyes.    [provider]  pravastatin (PRAVACHOL) 20 MG tablet Take 20 mg by mouth at bedtime.    [provider]  solifenacin (VESICARE) 5 MG tablet Take 5 mg by mouth daily.    [provider]      Allergies    Sulfa drugs cross reactors    Review of Systems   Review of Systems  All other systems reviewed and are negative.   Physical Exam Updated Vital Signs BP (!) 151/81 (BP Location: Right Arm)   Pulse 75   Temp 97.6 F (36.4 C) (Temporal)   Resp 18   Ht '5\' 5"'$  (1.651 m)   Wt 65.8 kg   SpO2 96%   BMI 24.13 kg/m  Physical Exam Vitals and nursing note reviewed.  Constitutional:  General: She is not in acute distress.    Appearance: She is well-developed.     Comments: GCS 15, ABC intact  HENT:     Head: Normocephalic and atraumatic.  Eyes:     Extraocular Movements: Extraocular movements intact.     Conjunctiva/sclera: Conjunctivae normal.     Pupils: Pupils are equal, round, and reactive to light.  Neck:     Comments: No midline tenderness to palpation of the cervical spine.  Range of motion intact Cardiovascular:     Rate and Rhythm: Normal rate and regular rhythm.  Pulmonary:     Effort: Pulmonary effort is normal. No respiratory distress.     Breath sounds: Normal breath sounds.  Chest:     Comments: Clavicles stable nontender to AP compression.  Chest wall stable and nontender to AP and lateral compression. Abdominal:      Palpations: Abdomen is soft.     Tenderness: There is no abdominal tenderness.     Comments: Pelvis stable to lateral compression  Musculoskeletal:     Cervical back: Neck supple.     Comments: No midline tenderness to palpation of the thoracic or lumbar spine.  Extremities atraumatic with intact range of motion with the exception of a skin tear along the left forearm, hemostatic  Skin:    General: Skin is warm and dry.  Neurological:     Mental Status: She is alert.     Comments: Cranial nerves II through XII grossly intact.  Moving all 4 extremities spontaneously.  Sensation grossly intact all 4 extremities     ED Results / Procedures / Treatments   Labs (all labs ordered are listed, but only abnormal results are displayed) Labs Reviewed - No data to display  EKG None  Radiology No results found.  Procedures Procedures    Medications Ordered in ED Medications - No data to display  ED Course/ Medical Decision Making/ A&P                           Medical Decision Making    88 year old female with medical history significant for HLD, osteoarthritis, PUD, GERD, HTN, atrial fibrillation on anticoagulation who presents emerged department after a fall.  The patient states that she tripped while at the grocery store and landed on her head on the back of her head.  She also sustained a skin tear along the left forearm.  She states that her tetanus is up-to-date.  She denies any loss of consciousness.  She denies any headache, blurry vision, nausea or vomiting, neurologic deficit.  She arrives to the emergency department GCS 15, ABC intact.  On arrival, the patient was vitally stable.  Physical exam concerning for skin tear along the left forearm, patient GCS 15, alert and at neurologic baseline.  No focal neurologic deficits. The patient's tetanus is up-to-date.  CT imaging of the head and cervical spine was performed which revealed no evidence of acute intracranial abnormality,  no evidence of skull fracture, no evidence of fracture or malalignment of the cervical spine.  The patient's skin tear was dressed by nursing staff bedside.  She was advised wound care instructions.  No other injuries identified on primary secondary survey.  Low concern for acute fracture or other injury based on her physical exam.  Ambulatory and stable for discharge with plan for outpatient follow-up with return precautions provided.   Final Clinical Impression(s) / ED Diagnoses Final diagnoses:  Fall, initial encounter  Skin tear of left forearm without complication, initial encounter    Rx / DC Orders ED Discharge Orders     None         Regan Lemming, MD 06/29/22 1448

## 2022-06-29 NOTE — Discharge Instructions (Signed)
Your CT imaging was negative for acute skull fracture, intracranial bleeding, fracture or dislocation of your cervical spine.  You have a skin tear along the left forearm which will need wound care to include daily dressing changes, cleaning with warm soapy water, daily bacitracin ointment.

## 2022-07-24 ENCOUNTER — Telehealth: Payer: Self-pay | Admitting: Cardiology

## 2022-07-24 MED ORDER — AMLODIPINE BESYLATE 5 MG PO TABS: 5.0000 mg | ORAL_TABLET | Freq: Every day | ORAL | 0 refills | Status: DC

## 2022-07-24 NOTE — Telephone Encounter (Signed)
Pt's medication was sent to pt's pharmacy as requested. Confirmation received.  °

## 2022-07-24 NOTE — Telephone Encounter (Signed)
*  STAT* If patient is at the pharmacy, call can be transferred to refill team.   1. Which medications need to be refilled? (please list name of each medication and dose if known) amLODipine (NORVASC) 5 MG tablet   2. Which pharmacy/location (including street and city if local pharmacy) is medication to be sent to? [facility] [place], [place] to [facility]   3. Do they need a 30 day or 90 day supply? [facility] ask that it be faxs to the [facility] department. At [phone]

## 2022-07-30 ENCOUNTER — Ambulatory Visit
Admission: RE | Admit: 2022-07-30 | Discharge: 2022-07-30 | Disposition: A | Discharge status: Home / Self Care | Payer: Medicare HMO | Source: Ambulatory Visit | Attending: Hematology and Oncology | Admitting: Hematology and Oncology

## 2022-07-30 DIAGNOSIS — Z170 Estrogen receptor positive status [ER+]: Secondary | ICD-10-CM

## 2022-08-01 ENCOUNTER — Other Ambulatory Visit: Payer: Self-pay | Admitting: Cardiology

## 2022-08-01 DIAGNOSIS — I4821 Permanent atrial fibrillation: Secondary | ICD-10-CM

## 2022-08-01 NOTE — Telephone Encounter (Signed)
Prescription refill request for Eliquis received. Indication:afib Last office visit:2/23 Scr:1.1 Age: 88 Weight:65.8  kg  Prescription refilled

## 2022-09-01 ENCOUNTER — Ambulatory Visit: Payer: Medicare HMO | Attending: Cardiology | Admitting: Cardiology

## 2022-09-01 ENCOUNTER — Encounter: Payer: Self-pay | Admitting: Cardiology

## 2022-09-01 VITALS — BP 118/72 | HR 74 | Ht 65.5 in | Wt 138.0 lb

## 2022-09-01 DIAGNOSIS — E785 Hyperlipidemia, unspecified: Secondary | ICD-10-CM

## 2022-09-01 DIAGNOSIS — I351 Nonrheumatic aortic (valve) insufficiency: Secondary | ICD-10-CM

## 2022-09-01 DIAGNOSIS — I10 Essential (primary) hypertension: Secondary | ICD-10-CM | POA: Diagnosis not present

## 2022-09-01 DIAGNOSIS — R0602 Shortness of breath: Secondary | ICD-10-CM

## 2022-09-01 DIAGNOSIS — I4821 Permanent atrial fibrillation: Secondary | ICD-10-CM | POA: Diagnosis not present

## 2022-09-01 NOTE — Patient Instructions (Signed)
Medication Instructions:  Your physician recommends that you continue on your current medications as directed. Please refer to the Current Medication list given to you today.  *If you need a refill on your cardiac medications before your next appointment, please call your pharmacy*   Lab Work: None.  If you have labs (blood work) drawn today and your tests are completely normal, you will receive your results only by: [place] (if you have MyChart) OR A paper copy in the mail If you have any lab test that is abnormal or we need to change your treatment, we will call you to review the results.   Testing/Procedures: Your physician has requested that you have a lexiscan myoview.  For further information please visit [email or web address].   Your physician has requested that you have an echocardiogram. Echocardiography is a painless test that uses sound waves to create images of your heart. It provides your doctor with information about the size and shape of your heart and how well your heart's chambers and valves are working. This procedure takes approximately one hour. There are no restrictions for this procedure. Please do NOT wear cologne, perfume, aftershave, or lotions (deodorant is allowed). Please arrive 15 minutes prior to your appointment time.   Follow-Up: At [facility], you and your health needs are our priority.  As part of our continuing mission to provide you with exceptional heart care, we have created designated Provider Care Teams.  These Care Teams include your primary Cardiologist (physician) and Advanced Practice Providers (APPs -  Physician Assistants and Nurse Practitioners) who all work together to provide you with the care you need, when you need it.  We recommend signing up for the patient portal called "MyChart".  Sign up information is provided on this After Visit Summary.  MyChart is used to connect with patients for Virtual Visits (Telemedicine).   Patients are able to view lab/test results, encounter notes, upcoming appointments, etc.  Non-urgent messages can be sent to your provider as well.   To learn more about what you can do with MyChart, go to [email or web address].    Your next appointment:   1 year(s)  Provider:   Fransico Him, MD

## 2022-09-01 NOTE — Addendum Note (Signed)
Addended by: Joni Reining on: 09/01/2022 11:37 AM   Modules accepted: Orders

## 2022-09-01 NOTE — Progress Notes (Signed)
Cardiology Office Note:    Date:  09/01/2022   ID:  Mallory Stephens, DOB [birth year], MRN [id number]  PCP:  [name], MD  Cardiologist:  Fransico Him, MD    Referring MD: [name], MD   Chief Complaint  Patient presents with   Atrial Fibrillation   Hypertension   Hyperlipidemia    History of Present Illness:    Mallory Stephens is a 89 y.o. female with a hx of chronic atrial fibrillation and HTN. She is here today for followup and is doing well.  She denies any chest pain or pressure, PND, orthopnea, LE edema, dizziness, palpitations or syncope. She recently has noticed some DOE which is new for her.  She says that it occurs when she is walking.  She has had a few nosebleeds since I saw her last. She is compliant with her meds and is tolerating meds with no SE.    Past Medical History:  Diagnosis Date   Breast cancer ([place]) 07/24/2020   Dyslipidemia    Dysrhythmia    atrial fibrillation   Edema extremities 09/03/2016   Family history of breast cancer    Family history of prostate cancer    Fatty liver    GERD (gastroesophageal reflux disease)    History of basal cell carcinoma    Hypertension    Lymphocytosis    followed by Dr. Julien Nordmann   Multinodular goiter    Osteoarthritis    Permanent atrial fibrillation (HCC)    atrial fibrillation s/p DCCV 3/10 and 6/10   PONV (postoperative nausea and vomiting)    PUD (peptic ulcer disease)    Pulmonary nodules    followed  by Dr. Stephanie Acre   UTI (urinary tract infection) 04/2017    Past Surgical History:  Procedure Laterality Date   aspiration of cyst  1978   BREAST LUMPECTOMY WITH RADIOACTIVE SEED LOCALIZATION Left 09/06/2020   Procedure: LEFT BREAST LUMPECTOMY WITH RADIOACTIVE SEED LOCALIZATION;  Surgeon: [name], MD;  Location: [facility];  Service: General;  Laterality: Left;   [place]   right breast for benign lesion   BREAST SURGERY  1971   bilateral lumpectomy for beingn lesions    CARDIOVERSION  07/22/2011   Procedure: CARDIOVERSION;  Surgeon: [name], MD;  Location: [facility];  Service: Cardiovascular;  Laterality: N/A;   CARDIOVERSION  09/02/2011   Procedure: CARDIOVERSION;  Surgeon: [name], MD;  Location: [facility];  Service: Cardiovascular;  Laterality: N/A;   cataract     cataract removal-bilateral   CHOLECYSTECTOMY  2003   CYSTOSCOPY     DILATION AND CURETTAGE OF UTERUS  1979   JOINT REPLACEMENT  2002   Right knee   JOINT REPLACEMENT  2007   left knee    Current Medications: Current Meds  Medication Sig   acetaminophen (TYLENOL) 325 MG tablet Take 325 mg by mouth every 6 (six) hours as needed for moderate pain.   amLODipine (NORVASC) 5 MG tablet Take 1 tablet (5 mg total) by mouth daily.   anastrozole (ARIMIDEX) 1 MG tablet Take 1 tablet (1 mg total) by mouth daily.   cholecalciferol (VITAMIN D3) 25 MCG (1000 UNIT) tablet Take 1 tablet (1,000 Units total) by mouth daily.   ELIQUIS 5 MG TABS tablet TAKE 1 TABLET TWICE A DAY   famotidine (PEPCID) 40 MG tablet Take 40 mg by mouth at bedtime.   ibandronate (BONIVA) 150 MG tablet Take 1 tablet (150 mg total) by mouth every  30 (thirty) days. Take in the morning with a full glass of water, on an empty stomach, and do not take anything else by mouth or lie down for the next 30 min. Take on the 10th day of each month.   losartan (COZAAR) 100 MG tablet TAKE 1 TABLET BY MOUTH EVERY DAY   metoprolol tartrate (LOPRESSOR) 25 MG tablet TAKE 1 TABLET TWICE A DAY   PARoxetine (PAXIL) 10 MG tablet Take 10 mg by mouth every morning.   polyvinyl alcohol (LIQUIFILM TEARS) 1.4 % ophthalmic solution Place 1-2 drops into both eyes every 6 (six) hours as needed for dry eyes.   pravastatin (PRAVACHOL) 20 MG tablet Take 20 mg by mouth at bedtime.   solifenacin (VESICARE) 5 MG tablet Take 5 mg by mouth daily.     Allergies:   Sulfa drugs cross reactors   Social History   Socioeconomic History   Marital status: Widowed     Spouse name: Not on file   Number of children: Not on file   Years of education: Not on file   Highest education level: Not on file  Occupational History   Not on file  Tobacco Use   Smoking status: Former    Packs/day: 0.50    Years: 10.00    Total pack years: 5.00    Types: Cigarettes    Quit date: 07/16/1989    Years since quitting: 33.1   Smokeless tobacco: Never  Vaping Use   Vaping Use: Never used  Substance and Sexual Activity   Alcohol use: Yes    Comment: occasional wine   Drug use: No   Sexual activity: Not on file  Other Topics Concern   Not on file  Social History Narrative   Not on file   Social Determinants of Health   Financial Resource Strain: Not on file  Food Insecurity: Not on file  Transportation Needs: Not on file  Physical Activity: Not on file  Stress: Not on file  Social Connections: Not on file     Family History: The patient's family history includes Arthritis in her sister; Breast cancer in her niece, niece, and sister; Heart disease in her brother; Prostate cancer in her brother, brother, and nephew; Scoliosis in her sister; Stroke in her father and mother.  ROS:   Please see the history of present illness.    Review of Systems  Musculoskeletal:  Negative for falls, gout and muscle cramps.    All other systems reviewed and negative.   EKGs/Labs/Other Studies Reviewed:    The following studies were reviewed today:  labs  EKG:  EKG is ordered today and demonstrates atrial fibrillation with CVR and septal infarct  Recent Labs: 06/17/2022: ALT 8; BUN 26; Creatinine 1.15; Hemoglobin 12.0; Platelet Count 231; Potassium 4.0; Sodium 139   Recent Lipid Panel    Component Value Date/Time   CHOL 133 06/01/2019 0958   TRIG 85 06/01/2019 0958   HDL 45 06/01/2019 0958   CHOLHDL 3.0 06/01/2019 0958   CHOLHDL 3 02/16/2014 0752   VLDL 22.4 02/16/2014 0752   LDLCALC 72 06/01/2019 0958    Physical Exam:    VS:  BP 118/72   Pulse 74    Ht 5' 5.5" (1.664 m)   Wt 138 lb (62.6 kg)   SpO2 96%   BMI 22.62 kg/m     Wt Readings from Last 3 Encounters:  09/01/22 138 lb (62.6 kg)  06/29/22 145 lb (65.8 kg)  06/17/22 143 lb (64.9 kg)  GEN: Well nourished, well developed in no acute distress HEENT: Normal NECK: No JVD; No carotid bruits LYMPHATICS: No lymphadenopathy CARDIAC:irregularly irregular, no murmurs, rubs, gallops RESPIRATORY:  Clear to auscultation without rales, wheezing or rhonchi  ABDOMEN: Soft, non-tender, non-distended MUSCULOSKELETAL:  No edema; No deformity  SKIN: Warm and dry NEUROLOGIC:  Alert and oriented x 3 PSYCHIATRIC:  Normal affect   ASSESSMENT:    1. Permanent atrial fibrillation ([place])   2. Essential hypertension   3. Hyperlipidemia, unspecified hyperlipidemia type   4. SOB (shortness of breath)   5. Nonrheumatic aortic valve insufficiency    PLAN:    In order of problems listed above:  1.  Permanent atrial fibrillation -HR is well controlled on exam today and she denies any palpitaitons -she has had a few nose bleeds in the past and was seen by ENT and they gave her some tips on how to avoid them and she has only had 1 since being seen by them and was able to get it stopped quickly  -I have personally reviewed and interpreted outside labs performed by patient's PCP which showed serum creatinine 1.15, hemoglobin 12 and potassium 4 on 06/17/2022 -Continue prescription drug management with Eliquis 5 mg twice daily and Lopressor 25 mg twice daily with as needed refills  2.  HTN -BP controlled on exam -Continue prescription management with Lopressor 25 mg twice daily, amlodipine 5 mg daily and losartan 100 mg daily with as needed refills  3. HLD -followed by PCP -LDL was 98 in March 2023 -continue statin  4.  SOB -this is new and is exertional related -I will get a [place] [place] to rule out ischemia -Shared Decision Making/Informed Consent The risks [chest pain, shortness of  breath, cardiac arrhythmias, dizziness, blood pressure fluctuations, myocardial infarction, stroke/transient ischemic attack, nausea, vomiting, allergic reaction, radiation exposure, metallic taste sensation and life-threatening complications (estimated to be 1 in 10,000)], benefits (risk stratification, diagnosing coronary artery disease, treatment guidance) and alternatives of a nuclear stress test were discussed in detail with Ms. Geddis and she agrees to proceed. -check 2D echo to  make sure there has been no change in her EF or no worsening of AI  5.  Aortic insuff -mild by echo 7/23   Medication Adjustments/Labs and Tests Ordered: Current medicines are reviewed at length with the patient today.  Concerns regarding medicines are outlined above.  Orders Placed This Encounter  Procedures   EKG 12-Lead   No orders of the defined types were placed in this encounter.   Signed, Fransico Him, MD  09/01/2022 11:26 AM    [place]

## 2022-09-03 ENCOUNTER — Ambulatory Visit (HOSPITAL_COMMUNITY): Payer: Medicare HMO | Attending: Cardiology

## 2022-09-03 DIAGNOSIS — R0602 Shortness of breath: Secondary | ICD-10-CM

## 2022-09-03 LAB — MYOCARDIAL PERFUSION IMAGING
LV dias vol: 51 mL (ref 46–106)
LV sys vol: 12 mL
Nuc Stress EF: 77 %
Peak HR: 92 {beats}/min
Rest HR: 75 {beats}/min
Rest Nuclear Isotope Dose: 10.1 mCi
SDS: 4
SRS: 2
SSS: 6
ST Depression (mm): 0 mm
Stress Nuclear Isotope Dose: 31.7 mCi
TID: 1.02

## 2022-09-03 MED ORDER — TECHNETIUM TC 99M TETROFOSMIN IV KIT
31.7000 | PACK | Freq: Once | INTRAVENOUS | Status: AC | PRN
  Administered 2022-09-03: 31.7 via INTRAVENOUS

## 2022-09-03 MED ORDER — REGADENOSON 0.4 MG/5ML IV SOLN
0.4000 mg | Freq: Once | INTRAVENOUS | Status: AC
  Administered 2022-09-03: 0.4 mg via INTRAVENOUS

## 2022-09-03 MED ORDER — TECHNETIUM TC 99M TETROFOSMIN IV KIT
10.1000 | PACK | Freq: Once | INTRAVENOUS | Status: AC | PRN
  Administered 2022-09-03: 10.1 via INTRAVENOUS

## 2022-09-08 NOTE — Addendum Note (Signed)
Addended by: [name] on: 09/08/2022 08:11 AM   Modules accepted: Orders

## 2022-09-12 ENCOUNTER — Other Ambulatory Visit (HOSPITAL_COMMUNITY): Payer: Medicare HMO

## 2022-10-01 ENCOUNTER — Telehealth: Payer: Self-pay

## 2022-10-01 DIAGNOSIS — R0602 Shortness of breath: Secondary | ICD-10-CM

## 2022-10-01 NOTE — Telephone Encounter (Signed)
Attempting several times to get ahold of patient and discuss Dr. Theodosia Blender recommendation for Pulmonology referral. Unable to leave message, letter sent. Referral placed to pulmonology.

## 2022-10-01 NOTE — Telephone Encounter (Signed)
-----   Message from Michaelyn Barter, RN sent at 09/04/2022 11:05 AM EST -----  ----- Message ----- From: [name], MD Sent: 09/03/2022   4:47 PM EST To: [facility] Triage  Refer to pulmonary

## 2022-10-03 ENCOUNTER — Ambulatory Visit (HOSPITAL_COMMUNITY): Payer: Medicare HMO | Attending: Cardiology

## 2022-10-03 DIAGNOSIS — R0602 Shortness of breath: Secondary | ICD-10-CM | POA: Diagnosis present

## 2022-10-03 LAB — ECHOCARDIOGRAM COMPLETE
AR max vel: 2.39 cm2
AV Area VTI: 2.27 cm2
AV Area mean vel: 2.28 cm2
AV Mean grad: 8.5 mmHg
AV Peak grad: 15 mmHg
Ao pk vel: 1.94 m/s
Area-P 1/2: 4.36 cm2
P 1/2 time: 443 msec
S' Lateral: 2.4 cm

## 2022-10-08 ENCOUNTER — Other Ambulatory Visit: Payer: Self-pay | Admitting: Cardiology

## 2022-10-25 ENCOUNTER — Emergency Department (HOSPITAL_COMMUNITY): Payer: Medicare HMO

## 2022-10-25 ENCOUNTER — Inpatient Hospital Stay (HOSPITAL_COMMUNITY)
Admission: EM | Admit: 2022-10-25 | Discharge: 2022-10-29 | DRG: 193 | Disposition: A | Discharge status: Home Health | Payer: Medicare HMO | Attending: Family Medicine | Admitting: Family Medicine

## 2022-10-25 ENCOUNTER — Encounter (HOSPITAL_COMMUNITY): Payer: Self-pay

## 2022-10-25 ENCOUNTER — Other Ambulatory Visit: Payer: Self-pay

## 2022-10-25 DIAGNOSIS — C50919 Malignant neoplasm of unspecified site of unspecified female breast: Secondary | ICD-10-CM

## 2022-10-25 DIAGNOSIS — C50912 Malignant neoplasm of unspecified site of left female breast: Secondary | ICD-10-CM | POA: Diagnosis present

## 2022-10-25 DIAGNOSIS — I5031 Acute diastolic (congestive) heart failure: Secondary | ICD-10-CM | POA: Diagnosis present

## 2022-10-25 DIAGNOSIS — J9601 Acute respiratory failure with hypoxia: Secondary | ICD-10-CM

## 2022-10-25 DIAGNOSIS — D649 Anemia, unspecified: Secondary | ICD-10-CM | POA: Diagnosis not present

## 2022-10-25 DIAGNOSIS — I509 Heart failure, unspecified: Secondary | ICD-10-CM | POA: Diagnosis not present

## 2022-10-25 DIAGNOSIS — J189 Pneumonia, unspecified organism: Secondary | ICD-10-CM

## 2022-10-25 DIAGNOSIS — E876 Hypokalemia: Secondary | ICD-10-CM | POA: Diagnosis present

## 2022-10-25 DIAGNOSIS — I5021 Acute systolic (congestive) heart failure: Secondary | ICD-10-CM | POA: Diagnosis not present

## 2022-10-25 DIAGNOSIS — I272 Pulmonary hypertension, unspecified: Secondary | ICD-10-CM | POA: Diagnosis present

## 2022-10-25 DIAGNOSIS — E785 Hyperlipidemia, unspecified: Secondary | ICD-10-CM | POA: Diagnosis present

## 2022-10-25 DIAGNOSIS — Z66 Do not resuscitate: Secondary | ICD-10-CM | POA: Diagnosis present

## 2022-10-25 DIAGNOSIS — I4821 Permanent atrial fibrillation: Secondary | ICD-10-CM

## 2022-10-25 DIAGNOSIS — Z7901 Long term (current) use of anticoagulants: Secondary | ICD-10-CM | POA: Diagnosis not present

## 2022-10-25 DIAGNOSIS — I10 Essential (primary) hypertension: Secondary | ICD-10-CM | POA: Diagnosis not present

## 2022-10-25 DIAGNOSIS — I959 Hypotension, unspecified: Secondary | ICD-10-CM | POA: Diagnosis present

## 2022-10-25 DIAGNOSIS — Z79899 Other long term (current) drug therapy: Secondary | ICD-10-CM | POA: Diagnosis not present

## 2022-10-25 DIAGNOSIS — Z1152 Encounter for screening for COVID-19: Secondary | ICD-10-CM

## 2022-10-25 DIAGNOSIS — I110 Hypertensive heart disease with heart failure: Secondary | ICD-10-CM | POA: Diagnosis present

## 2022-10-25 DIAGNOSIS — D509 Iron deficiency anemia, unspecified: Secondary | ICD-10-CM | POA: Diagnosis present

## 2022-10-25 DIAGNOSIS — Z170 Estrogen receptor positive status [ER+]: Secondary | ICD-10-CM

## 2022-10-25 DIAGNOSIS — Z882 Allergy status to sulfonamides status: Secondary | ICD-10-CM

## 2022-10-25 DIAGNOSIS — I5033 Acute on chronic diastolic (congestive) heart failure: Secondary | ICD-10-CM

## 2022-10-25 DIAGNOSIS — Z79811 Long term (current) use of aromatase inhibitors: Secondary | ICD-10-CM

## 2022-10-25 HISTORY — DX: Acute respiratory failure with hypoxia: J96.01

## 2022-10-25 HISTORY — DX: Pneumonia, unspecified organism: J18.9

## 2022-10-25 LAB — RESPIRATORY PANEL BY PCR

## 2022-10-25 LAB — CBC WITH DIFFERENTIAL/PLATELET
Abs Immature Granulocytes: 0.09 10*3/uL — ABNORMAL HIGH (ref 0.00–0.07)
Basophils Absolute: 0.1 10*3/uL (ref 0.0–0.1)
Basophils Relative: 1 %
Eosinophils Absolute: 0.2 10*3/uL (ref 0.0–0.5)
Eosinophils Relative: 1 %
HCT: 28.8 % — ABNORMAL LOW (ref 36.0–46.0)
Hemoglobin: 8.8 g/dL — ABNORMAL LOW (ref 12.0–15.0)
Immature Granulocytes: 1 %
Lymphocytes Relative: 22 %
Lymphs Abs: 3.5 10*3/uL (ref 0.7–4.0)
MCH: 24.8 pg — ABNORMAL LOW (ref 26.0–34.0)
MCHC: 30.6 g/dL (ref 30.0–36.0)
MCV: 81.1 fL (ref 80.0–100.0)
Monocytes Absolute: 1 10*3/uL (ref 0.1–1.0)
Monocytes Relative: 7 %
Neutro Abs: 10.9 10*3/uL — ABNORMAL HIGH (ref 1.7–7.7)
Neutrophils Relative %: 68 %
Platelets: 361 10*3/uL (ref 150–400)
RBC: 3.55 MIL/uL — ABNORMAL LOW (ref 3.87–5.11)
RDW: 15.1 % (ref 11.5–15.5)
WBC: 15.9 10*3/uL — ABNORMAL HIGH (ref 4.0–10.5)
nRBC: 0 % (ref 0.0–0.2)

## 2022-10-25 LAB — COMPREHENSIVE METABOLIC PANEL
ALT: 13 U/L (ref 0–44)
AST: 16 U/L (ref 15–41)
Albumin: 3.6 g/dL (ref 3.5–5.0)
Alkaline Phosphatase: 43 U/L (ref 38–126)
Anion gap: 11 (ref 5–15)
BUN: 21 mg/dL (ref 8–23)
CO2: 23 mmol/L (ref 22–32)
Calcium: 9.5 mg/dL (ref 8.9–10.3)
Chloride: 106 mmol/L (ref 98–111)
Creatinine, Ser: 1.08 mg/dL — ABNORMAL HIGH (ref 0.44–1.00)
GFR, Estimated: 50 mL/min — ABNORMAL LOW (ref >60)
Glucose, Bld: 126 mg/dL — ABNORMAL HIGH (ref 70–99)
Potassium: 3.3 mmol/L — ABNORMAL LOW (ref 3.5–5.1)
Sodium: 140 mmol/L (ref 135–145)
Total Bilirubin: 1.3 mg/dL — ABNORMAL HIGH (ref 0.3–1.2)
Total Protein: 6.3 g/dL — ABNORMAL LOW (ref 6.5–8.1)

## 2022-10-25 LAB — I-STAT VENOUS BLOOD GAS, ED
Acid-base deficit: 1 mmol/L (ref 0.0–2.0)
Bicarbonate: 24.4 mmol/L (ref 20.0–28.0)
Calcium, Ion: 1.32 mmol/L (ref 1.15–1.40)
HCT: 28 % — ABNORMAL LOW (ref 36.0–46.0)
Hemoglobin: 9.5 g/dL — ABNORMAL LOW (ref 12.0–15.0)
O2 Saturation: 82 %
Potassium: 3.4 mmol/L — ABNORMAL LOW (ref 3.5–5.1)
Sodium: 141 mmol/L (ref 135–145)
TCO2: 26 mmol/L (ref 22–32)
pCO2, Ven: 44.6 mmHg (ref 44–60)
pH, Ven: 7.346 (ref 7.25–7.43)
pO2, Ven: 49 mmHg — ABNORMAL HIGH (ref 32–45)

## 2022-10-25 LAB — LACTIC ACID, PLASMA
Lactic Acid, Venous: 1.2 mmol/L (ref 0.5–1.9)
Lactic Acid, Venous: 1.2 mmol/L (ref 0.5–1.9)

## 2022-10-25 LAB — BRAIN NATRIURETIC PEPTIDE: B Natriuretic Peptide: 613 pg/mL — ABNORMAL HIGH (ref 0.0–100.0)

## 2022-10-25 LAB — IRON AND TIBC
Iron: 16 ug/dL — ABNORMAL LOW (ref 28–170)
Saturation Ratios: 5 % — ABNORMAL LOW (ref 10.4–31.8)
TIBC: 336 ug/dL (ref 250–450)
UIBC: 320 ug/dL

## 2022-10-25 LAB — TROPONIN I (HIGH SENSITIVITY): Troponin I (High Sensitivity): 8 ng/L (ref <18)

## 2022-10-25 LAB — RESP PANEL BY RT-PCR (RSV, FLU A&B, COVID)  RVPGX2
Influenza A by PCR: NEGATIVE
Influenza B by PCR: NEGATIVE
Resp Syncytial Virus by PCR: NEGATIVE
SARS Coronavirus 2 by RT PCR: NEGATIVE

## 2022-10-25 LAB — MAGNESIUM: Magnesium: 1.7 mg/dL (ref 1.7–2.4)

## 2022-10-25 LAB — STREP PNEUMONIAE URINARY ANTIGEN: Strep Pneumo Urinary Antigen: NEGATIVE

## 2022-10-25 LAB — FERRITIN: Ferritin: 12 ng/mL (ref 11–307)

## 2022-10-25 LAB — PROCALCITONIN: Procalcitonin: <0.1 ng/mL

## 2022-10-25 MED ORDER — FAMOTIDINE 20 MG PO TABS
40.0000 mg | ORAL_TABLET | Freq: Every day | ORAL | Status: DC
  Administered 2022-10-25 – 2022-10-28 (×4): 40 mg via ORAL
  Filled 2022-10-25 (×4): qty 2

## 2022-10-25 MED ORDER — SODIUM CHLORIDE 0.9 % IV SOLN
2.0000 g | Freq: Once | INTRAVENOUS | Status: AC
  Administered 2022-10-25: 2 g via INTRAVENOUS
  Filled 2022-10-25: qty 20

## 2022-10-25 MED ORDER — SODIUM CHLORIDE 0.9 % IV SOLN
500.0000 mg | Freq: Once | INTRAVENOUS | Status: AC
  Administered 2022-10-25: 500 mg via INTRAVENOUS
  Filled 2022-10-25: qty 5

## 2022-10-25 MED ORDER — APIXABAN 5 MG PO TABS
5.0000 mg | ORAL_TABLET | Freq: Two times a day (BID) | ORAL | Status: DC
  Administered 2022-10-25 – 2022-10-29 (×8): 5 mg via ORAL
  Filled 2022-10-25 (×8): qty 1

## 2022-10-25 MED ORDER — ONDANSETRON HCL 4 MG/2ML IJ SOLN
4.0000 mg | Freq: Four times a day (QID) | INTRAMUSCULAR | Status: DC | PRN
  Administered 2022-10-27: 4 mg via INTRAVENOUS
  Filled 2022-10-25: qty 2

## 2022-10-25 MED ORDER — ANASTROZOLE 1 MG PO TABS
1.0000 mg | ORAL_TABLET | Freq: Every day | ORAL | Status: DC
  Administered 2022-10-25 – 2022-10-29 (×5): 1 mg via ORAL
  Filled 2022-10-25 (×5): qty 1

## 2022-10-25 MED ORDER — SODIUM CHLORIDE 0.9 % IV SOLN
2.0000 g | INTRAVENOUS | Status: DC
  Administered 2022-10-26 – 2022-10-28 (×3): 2 g via INTRAVENOUS
  Filled 2022-10-25 (×3): qty 20

## 2022-10-25 MED ORDER — PAROXETINE HCL 10 MG PO TABS
10.0000 mg | ORAL_TABLET | Freq: Every day | ORAL | Status: DC
  Administered 2022-10-25 – 2022-10-29 (×5): 10 mg via ORAL
  Filled 2022-10-25 (×5): qty 1

## 2022-10-25 MED ORDER — SODIUM CHLORIDE 0.9% FLUSH: 3.0000 mL | INTRAVENOUS | Status: DC | PRN

## 2022-10-25 MED ORDER — ACETAMINOPHEN 325 MG PO TABS: 650.0000 mg | ORAL_TABLET | Freq: Four times a day (QID) | ORAL | Status: DC | PRN

## 2022-10-25 MED ORDER — PRAVASTATIN SODIUM 10 MG PO TABS
20.0000 mg | ORAL_TABLET | Freq: Every day | ORAL | Status: DC
  Administered 2022-10-25 – 2022-10-28 (×4): 20 mg via ORAL
  Filled 2022-10-25 (×4): qty 2

## 2022-10-25 MED ORDER — FUROSEMIDE 10 MG/ML IJ SOLN
20.0000 mg | Freq: Once | INTRAMUSCULAR | Status: AC
  Administered 2022-10-25: 20 mg via INTRAVENOUS
  Filled 2022-10-25: qty 2

## 2022-10-25 MED ORDER — SODIUM CHLORIDE 0.9% FLUSH
3.0000 mL | Freq: Two times a day (BID) | INTRAVENOUS | Status: DC
  Administered 2022-10-25 – 2022-10-29 (×8): 3 mL via INTRAVENOUS

## 2022-10-25 MED ORDER — POTASSIUM CHLORIDE CRYS ER 20 MEQ PO TBCR
30.0000 meq | EXTENDED_RELEASE_TABLET | Freq: Once | ORAL | Status: AC
  Administered 2022-10-25: 30 meq via ORAL
  Filled 2022-10-25: qty 1

## 2022-10-25 MED ORDER — LEVALBUTEROL HCL 0.63 MG/3ML IN NEBU: 0.6300 mg | INHALATION_SOLUTION | Freq: Four times a day (QID) | RESPIRATORY_TRACT | Status: DC | PRN

## 2022-10-25 MED ORDER — ACETAMINOPHEN 650 MG RE SUPP: 650.0000 mg | Freq: Four times a day (QID) | RECTAL | Status: DC | PRN

## 2022-10-25 MED ORDER — SODIUM CHLORIDE 0.9 % IV SOLN
500.0000 mg | INTRAVENOUS | Status: DC
  Administered 2022-10-26 – 2022-10-27 (×2): 500 mg via INTRAVENOUS
  Filled 2022-10-25 (×3): qty 5

## 2022-10-25 MED ORDER — METOPROLOL TARTRATE 25 MG PO TABS
25.0000 mg | ORAL_TABLET | Freq: Two times a day (BID) | ORAL | Status: DC
  Administered 2022-10-25 – 2022-10-27 (×4): 25 mg via ORAL
  Filled 2022-10-25 (×4): qty 1

## 2022-10-25 MED ORDER — LOSARTAN POTASSIUM 50 MG PO TABS
100.0000 mg | ORAL_TABLET | Freq: Every day | ORAL | Status: DC
  Administered 2022-10-25 – 2022-10-27 (×3): 100 mg via ORAL
  Filled 2022-10-25 (×3): qty 2

## 2022-10-25 MED ORDER — SODIUM CHLORIDE 0.9 % IV SOLN: 2.0000 g | INTRAVENOUS | Status: DC

## 2022-10-25 MED ORDER — ONDANSETRON HCL 4 MG PO TABS: 4.0000 mg | ORAL_TABLET | Freq: Four times a day (QID) | ORAL | Status: DC | PRN

## 2022-10-25 MED ORDER — IPRATROPIUM-ALBUTEROL 0.5-2.5 (3) MG/3ML IN SOLN
3.0000 mL | Freq: Once | RESPIRATORY_TRACT | Status: AC
  Administered 2022-10-25: 3 mL via RESPIRATORY_TRACT
  Filled 2022-10-25: qty 3

## 2022-10-25 MED ORDER — FUROSEMIDE 10 MG/ML IJ SOLN
20.0000 mg | Freq: Every day | INTRAMUSCULAR | Status: DC
  Administered 2022-10-26: 20 mg via INTRAVENOUS
  Filled 2022-10-25 (×2): qty 2

## 2022-10-25 MED ORDER — SODIUM CHLORIDE 0.9 % IV SOLN
250.0000 mL | INTRAVENOUS | Status: DC | PRN
  Administered 2022-10-27: 250 mL via INTRAVENOUS

## 2022-10-25 NOTE — ED Provider Notes (Signed)
[facility] Provider Note  CSN: [id number] Arrival date & time: 10/25/22 [id number]  Chief Complaint(s) Shortness of Breath  HPI Mallory Stephens is a 89 y.o. female with history of atrial fibrillation on Eliquis, hypertension, hyperlipidemia presenting to the emergency department with shortness of breath.  Patient reports 1 week of cough, productive of phlegm.  She reports that this morning she woke up and felt short of breath.  She denies any chest pain.  No fevers or chills.  No nausea or vomiting.  No lightheadedness or dizziness.  No abdominal pain.  Has not noticed any leg swelling.  Symptoms are mild.  EMS arrived, found patient was hypoxic to 91% on room air and placed patient on oxygen.  Patient denies any runny nose or sore throat.   Past Medical History History reviewed. No pertinent past medical history. Patient Active Problem List   Diagnosis Date Noted   Breast cancer ([place]) 10/25/2022   Permanent atrial fibrillation ([place]) 10/25/2022   Essential hypertension 10/25/2022   Hyperlipidemia 10/25/2022   Hypokalemia 10/25/2022   Normocytic anemia 10/25/2022   Acute respiratory failure with hypoxia ([place]) 10/25/2022   Home Medication(s) Prior to Admission medications   Not on File                                                                                                                                    Past Surgical History History reviewed. No pertinent surgical history. Family History History reviewed. No pertinent family history.  Social History Social History   Tobacco Use   Smoking status: Never   Smokeless tobacco: Never   Allergies Sulfa antibiotics  Review of Systems Review of Systems  All other systems reviewed and are negative.   Physical Exam Vital Signs  I have reviewed the triage vital signs BP (!) 152/141   Pulse 80   Temp 98.5 F (36.9 C) (Oral)   Resp (!) 22   Ht 5\' 5"  (1.651 m)   Wt 68 kg    SpO2 94%   BMI 24.96 kg/m  Physical Exam Vitals and nursing note reviewed.  Constitutional:      General: She is not in acute distress.    Appearance: She is well-developed.  HENT:     Head: Normocephalic and atraumatic.     Mouth/Throat:     Mouth: Mucous membranes are moist.  Eyes:     Pupils: Pupils are equal, round, and reactive to light.  Cardiovascular:     Rate and Rhythm: Normal rate and regular rhythm.     Heart sounds: No murmur heard. Pulmonary:     Effort: Tachypnea and accessory muscle usage present. No respiratory distress.     Breath sounds: Examination of the right-lower field reveals rales. Examination of the left-lower field reveals rales. Wheezing (trace) and rales present.  Abdominal:     General: Abdomen is flat.  Palpations: Abdomen is soft.     Tenderness: There is no abdominal tenderness.  Musculoskeletal:        General: No tenderness.     Right lower leg: Edema (1+ bilaterally) present.     Left lower leg: Edema present.  Skin:    General: Skin is warm and dry.  Neurological:     General: No focal deficit present.     Mental Status: She is alert. Mental status is at baseline.  Psychiatric:        Mood and Affect: Mood normal.        Behavior: Behavior normal.     ED Results and Treatments Labs (all labs ordered are listed, but only abnormal results are displayed) Labs Reviewed  COMPREHENSIVE METABOLIC PANEL - Abnormal; Notable for the following components:      Result Value   Potassium 3.3 (*)    Glucose, Bld 126 (*)    Creatinine, Ser 1.08 (*)    Total Protein 6.3 (*)    Total Bilirubin 1.3 (*)    GFR, Estimated 50 (*)    All other components within normal limits  CBC WITH DIFFERENTIAL/PLATELET - Abnormal; Notable for the following components:   WBC 15.9 (*)    RBC 3.55 (*)    Hemoglobin 8.8 (*)    HCT 28.8 (*)    MCH 24.8 (*)    Neutro Abs 10.9 (*)    Abs Immature Granulocytes 0.09 (*)    All other components within normal  limits  BRAIN NATRIURETIC PEPTIDE - Abnormal; Notable for the following components:   B Natriuretic Peptide 613.0 (*)    All other components within normal limits  I-STAT VENOUS BLOOD GAS, ED - Abnormal; Notable for the following components:   pO2, Ven 49 (*)    Potassium 3.4 (*)    HCT 28.0 (*)    Hemoglobin 9.5 (*)    All other components within normal limits  RESP PANEL BY RT-PCR (RSV, FLU A&B, COVID)  RVPGX2  CULTURE, BLOOD (ROUTINE X 2)  CULTURE, BLOOD (ROUTINE X 2)  LACTIC ACID, PLASMA  LACTIC ACID, PLASMA  MAGNESIUM  PROCALCITONIN  FERRITIN  IRON AND TIBC  OCCULT BLOOD X 1 CARD TO LAB, STOOL  TROPONIN I (HIGH SENSITIVITY)                                                                                                                          Radiology DG Chest Port 1 View  Result Date: 10/25/2022 CLINICAL DATA:  Cough and shortness of breath EXAM: PORTABLE CHEST - 1 VIEW COMPARISON:  None available FINDINGS: Relatively low lung volumes. Interstitial and airspace opacities involving bases more than apices, left greater than right. Mild cardiomegaly.  Aortic Atherosclerosis (ICD10-170.0). No effusion. Surgical clips project over the left mid lung. Regional bones unremarkable. IMPRESSION: 1. Asymmetric interstitial and airspace opacities, left greater than right. 2. Mild cardiomegaly. Electronically Signed   By: Lucrezia Europe M.D.   On:  10/25/2022 08:51    Pertinent labs & imaging results that were available during my care of the patient were reviewed by me and considered in my medical decision making (see MDM for details).  Medications Ordered in ED Medications  potassium chloride (KLOR-CON M) CR tablet 30 mEq (has no administration in time range)  ipratropium-albuterol (DUONEB) 0.5-2.5 (3) MG/3ML nebulizer solution 3 mL (3 mLs Nebulization Given 10/25/22 0836)  cefTRIAXone (ROCEPHIN) 2 g in sodium chloride 0.9 % 100 mL IVPB (0 g Intravenous Stopped 10/25/22 1011)  azithromycin  (ZITHROMAX) 500 mg in sodium chloride 0.9 % 250 mL IVPB (0 mg Intravenous Stopped 10/25/22 1115)                                                                                                                                     Procedures .Critical Care  Performed by: Cristie Hem, MD Authorized by: Cristie Hem, MD   Critical care provider statement:    Critical care time (minutes):  30   Critical care was necessary to treat or prevent imminent or life-threatening deterioration of the following conditions:  Respiratory failure   Critical care was time spent personally by me on the following activities:  Development of treatment plan with patient or surrogate, discussions with consultants, evaluation of patient's response to treatment, examination of patient, ordering and review of laboratory studies, ordering and review of radiographic studies, ordering and performing treatments and interventions, pulse oximetry, re-evaluation of patient's condition and review of old charts   Care discussed with: admitting provider     (including critical care time)  Medical Decision Making / ED Course   MDM:  89 year old female presenting to the emergency department with shortness of breath.  Patient hypoxic on room air.  Pulmonary exam with bibasilar crackles.  Suspect possible pneumonia, patient does have elevated white blood cell count.  She has multifocal infiltrate on chest x-ray concerning for multifocal pneumonia.  Will treat with antibiotics.  Could also represent CHF although patient only has mild lower extremity edema, will check BNP.  Will defer fluids given possible CHF as a contributing component.  Patient has trace wheezing, no history of asthma or COPD, suspect this is related to underlying process rather than bronchospasm but will trial breathing treatment.  Patient denies any chest pain, not tachycardic, lower concern for pulmonary embolism, ACS.  EKG demonstrates atrial  fibrillation without STEMI.  Will check troponin.  Given hypoxia patient will need to be admitted to the hospital.  Clinical Course as of 10/25/22 1130  Sat Oct 25, 2022  1128 Discussed with the hospitalist who will admit the patient for pneumonia.  [WS]    Clinical Course User Index [WS] Cristie Hem, MD     Additional history obtained: -Additional history obtained from family and ems -External records from outside source obtained and reviewed including: Chart review including previous notes, labs, imaging, consultation notes including echocardiogram  earlier this month   Lab Tests: -I ordered, reviewed, and interpreted labs.   The pertinent results include:   Labs Reviewed  COMPREHENSIVE METABOLIC PANEL - Abnormal; Notable for the following components:      Result Value   Potassium 3.3 (*)    Glucose, Bld 126 (*)    Creatinine, Ser 1.08 (*)    Total Protein 6.3 (*)    Total Bilirubin 1.3 (*)    GFR, Estimated 50 (*)    All other components within normal limits  CBC WITH DIFFERENTIAL/PLATELET - Abnormal; Notable for the following components:   WBC 15.9 (*)    RBC 3.55 (*)    Hemoglobin 8.8 (*)    HCT 28.8 (*)    MCH 24.8 (*)    Neutro Abs 10.9 (*)    Abs Immature Granulocytes 0.09 (*)    All other components within normal limits  BRAIN NATRIURETIC PEPTIDE - Abnormal; Notable for the following components:   B Natriuretic Peptide 613.0 (*)    All other components within normal limits  I-STAT VENOUS BLOOD GAS, ED - Abnormal; Notable for the following components:   pO2, Ven 49 (*)    Potassium 3.4 (*)    HCT 28.0 (*)    Hemoglobin 9.5 (*)    All other components within normal limits  RESP PANEL BY RT-PCR (RSV, FLU A&B, COVID)  RVPGX2  CULTURE, BLOOD (ROUTINE X 2)  CULTURE, BLOOD (ROUTINE X 2)  LACTIC ACID, PLASMA  LACTIC ACID, PLASMA  MAGNESIUM  PROCALCITONIN  FERRITIN  IRON AND TIBC  OCCULT BLOOD X 1 CARD TO LAB, STOOL  TROPONIN I (HIGH SENSITIVITY)     Notable for leukocytosis, elevated BNP  EKG   EKG Interpretation  Date/Time:  Saturday October 25 2022 07:59:09 EDT Ventricular Rate:  97 PR Interval:    QRS Duration: 75 QT Interval:  312 QTC Calculation: 397 R Axis:   107 Text Interpretation: Atrial fibrillation Right axis deviation Nonspecific T wave abnormality No previous tracing Confirmed by Garnette Gunner [phone]) on 10/25/2022 8:07:23 AM         Imaging Studies ordered: I ordered imaging studies including CXR On my interpretation imaging demonstrates bilateral infiltrates I independently visualized and interpreted imaging. I agree with the radiologist interpretation   Medicines ordered and prescription drug management: Meds ordered this encounter  Medications   ipratropium-albuterol (DUONEB) 0.5-2.5 (3) MG/3ML nebulizer solution 3 mL   cefTRIAXone (ROCEPHIN) 2 g in sodium chloride 0.9 % 100 mL IVPB    Order Specific Question:   Antibiotic Indication:    Answer:   CAP   azithromycin (ZITHROMAX) 500 mg in sodium chloride 0.9 % 250 mL IVPB   potassium chloride (KLOR-CON M) CR tablet 30 mEq    -I have reviewed the patients home medicines and have made adjustments as needed   Consultations Obtained: I requested consultation with the hospitalist,  and discussed lab and imaging findings as well as pertinent plan - they recommend: admission   Cardiac Monitoring: The patient was maintained on a cardiac monitor.  I personally viewed and interpreted the cardiac monitored which showed an underlying rhythm of: afib   Reevaluation: After the interventions noted above, I reevaluated the patient and found that their symptoms have improved  Co morbidities that complicate the patient evaluation History reviewed. No pertinent past medical history.    Dispostion: Disposition decision including need for hospitalization was considered, and patient admitted to the hospital.    Final Clinical Impression(s) / ED  Diagnoses Final diagnoses:  Community acquired pneumonia, unspecified laterality  Acute hypoxic respiratory failure ([place])     This chart was dictated using voice recognition software.  Despite best efforts to proofread,  errors can occur which can change the documentation meaning.    Cristie Hem, MD 10/25/22 1130

## 2022-10-25 NOTE — Assessment & Plan Note (Signed)
Check magnesium Replete Trend

## 2022-10-25 NOTE — Assessment & Plan Note (Signed)
Continue pravachol.  

## 2022-10-25 NOTE — ED Triage Notes (Signed)
Pt to the ed from home via EMS with a CC of sob when she woke up. Pt denies any cp, loc, dizziness. Family relays they heard some wheezing. PT denies fever, chills, n/v/d. Pt has had cough over the last week

## 2022-10-25 NOTE — Assessment & Plan Note (Addendum)
89 year old with 2 day history of worsening shortness of breath found to be 79% on room air and requiring 4L [place] to maintain oxygenation likely secondary to CAP vs new CHF exacerbation  -place in tele -likely secondary to CAP vs. New CHF exacerbation evidenced by elevated BNP to 613, CXR with cardiomegaly and Insterstitial opacities anc clinical/history of dyspnea on exertion, orthopnea and hypoxia  -meets SIRS criteria with leukocytosis and tachypnea and CXR with opacities. W/u for CAP. See #2 -check echo -strict I/O and daily weights  -continue 20mg  lasix daily  -check magnesium  -repeat CXR 2V tomorrow  -check RVP, covid/flu/RSV negative  -on eliquis with no missed doses, PE low on differential  -wean oxygen as tolerated

## 2022-10-25 NOTE — Assessment & Plan Note (Addendum)
Hgb in 08/2022, normal at 12.0 with no hx of previous anemia No complaints of bleeding or dark stools  Check iron studies and fecal occult Trend

## 2022-10-25 NOTE — H&P (Addendum)
History and Physical    Patient: Mallory Stephens [id number] DOB: 1933-06-11 DOA: 10/25/2022 DOS: the patient was seen and examined on 10/25/2022 PCP: [name], MD  Patient coming from: Home - lives with her sister. Ambulates independently.    Chief Complaint: shortness of breath and cough   HPI: Mallory Stephens is a 89 y.o. female with medical history significant of permanent atrial fibrillation, HTN, HLD, breast cancer who presented to ED with c/o of shortness of breath. She states symptoms started yesterday and got progressively worse.  She woke her sister up around 5AM because she couldn't breath.  She was laboring to breath and was down to 79% on room air at home with home pulse ox. She has had a cough for a few weeks that is dry in nature.  She has had no fever/chills. She has had some shortness of breath with this cough at the end of January with exertion  She has had worsening dyspnea on exertion over the last few days. No overt swelling in her legs and +orthopnea most noticed last night.  She has had no weight gain. No sick contacts. She has been nauseated x 2 weeks, no vomiting.     Denies any fever/chills, vision changes/headaches, chest pain or palpitations, abdominal pain, V/D, dysuria or leg swelling.    She does not smoke, drinks alcohol socially.   ER Course:  vitals: afebrile, bp: 152/141, HR: 80, RR: 22, oxygen: 94% on 4L [place]. Hypoxic to 84% with EMS  Pertinent labs: wbc: 15.9, hgb: 8.8, potassium: 3.3, creatinine: 1.08, bnp: 613,  CXR: 1. Asymmetric interstitial and airspace opacities, left greater than right. 2. Mild cardiomegaly. In ED: given duoneb, azithromycin and rocephin. BC obtained. TRH asked to admit.    Review of Systems: As mentioned in the history of present illness. All other systems reviewed and are negative. History reviewed. No pertinent past medical history. History reviewed. No pertinent surgical history. Social History:  reports that she  has never smoked. She has never used smokeless tobacco. No history on file for alcohol use and drug use.  Allergies  Allergen Reactions   Sulfa Antibiotics     History reviewed. No pertinent family history.  Prior to Admission medications   Not on File    Physical Exam: Vitals:   10/25/22 1030 10/25/22 1205 10/25/22 1250 10/25/22 1707  BP: (!) 152/141 (!) 149/89 (!) 170/60 (!) 161/71  Pulse: 80 90 100 77  Resp: (!) 22 (!) 27 20 18   Temp:  98.5 F (36.9 C) 97.7 F (36.5 C) 98.2 F (36.8 C)  TempSrc:   Oral   SpO2: 94% 91% 96% 99%  Weight:      Height:       General:  Appears calm and comfortable and is in NAD Eyes:  PERRL, EOMI, normal lids, iris ENT:  HOH, lips & tongue, mmm; appropriate dentition Neck:  no LAD, masses or thyromegaly; no carotid bruits, no JVD Cardiovascular:  Regularly irregular, no m/r/g. No LE edema.  Respiratory:  quiet wheezing in bilateral lower lobes, decreased air movement, otherwise wnl  Abdomen:  soft, NT, ND, NABS Back:   normal alignment, no CVAT Skin:  no rash or induration seen on limited exam Musculoskeletal:  grossly normal tone BUE/BLE, good ROM, no bony abnormality Lower extremity:  No LE edema.  Limited foot exam with no ulcerations.  2+ distal pulses. Psychiatric:  grossly normal mood and affect, speech fluent and appropriate, AOx3 Neurologic:  CN 2-12 grossly intact,  moves all extremities in coordinated fashion, sensation intact   Radiological Exams on Admission: Independently reviewed - see discussion in A/P where applicable  DG Chest Port 1 View  Result Date: 10/25/2022 CLINICAL DATA:  Cough and shortness of breath EXAM: PORTABLE CHEST - 1 VIEW COMPARISON:  None available FINDINGS: Relatively low lung volumes. Interstitial and airspace opacities involving bases more than apices, left greater than right. Mild cardiomegaly.  Aortic Atherosclerosis (ICD10-170.0). No effusion. Surgical clips project over the left mid lung. Regional  bones unremarkable. IMPRESSION: 1. Asymmetric interstitial and airspace opacities, left greater than right. 2. Mild cardiomegaly. Electronically Signed   By: Lucrezia Europe M.D.   On: 10/25/2022 08:51    EKG: Independently reviewed.  Atrial fibrillation with rate 97; nonspecific ST changes with no evidence of acute ischemia Similar to previous EKG    Labs on Admission: I have personally reviewed the available labs and imaging studies at the time of the admission.  Pertinent labs:    wbc: 15.9,  hgb: 8.8,  potassium: 3.3,  creatinine: 1.08,  bnp: 613,   Assessment and Plan: Principal Problem:   Acute respiratory failure with hypoxia secondary to CAP vs/+ new CHF exacerbation Active Problems:   CAP (community acquired pneumonia)   Normocytic anemia   Hypokalemia   Permanent atrial fibrillation (HCC)   Essential hypertension   Hyperlipidemia   Breast cancer (HCC)   CHF exacerbation (HCC)    Assessment and Plan: * Acute respiratory failure with hypoxia secondary to CAP vs/+ new CHF exacerbation 89 year old with 2 day history of worsening shortness of breath found to be 79% on room air and requiring 4L [place] to maintain oxygenation likely secondary to CAP vs new CHF exacerbation  -place in tele -likely secondary to CAP vs. New CHF exacerbation evidenced by elevated BNP to 613, CXR with cardiomegaly and Insterstitial opacities anc clinical/history of dyspnea on exertion, orthopnea and hypoxia  -meets SIRS criteria with leukocytosis and tachypnea and CXR with opacities. W/u for CAP. See #2 -check echo -strict I/O and daily weights  -continue 20mg  lasix daily  -check magnesium  -repeat CXR 2V tomorrow  -check RVP, covid/flu/RSV negative  -on eliquis with no missed doses, PE low on differential  -wean oxygen as tolerated   CAP (community acquired pneumonia) presenting with SOB/hypoxia and dry cough with leukocytosis and asymmetrical interstitial infiltrates and airspace opacities, left  greater than right and SIRS criteria with leukocytosis and tachypnea  -continue rocephin and azithromycin for CAP coverage -repeat 2V CXR tomorrow -check PCT -lactic acid wnl -BC pending  -urinary antigens pending  -xoponex PRN  -RVP Pending, covid/flu and RSV negative   Normocytic anemia Hgb in 08/2022, normal at 12.0 with no hx of previous anemia No complaints of bleeding or dark stools  Check iron studies and fecal occult Trend   Hypokalemia Check magnesium Replete Trend   Permanent atrial fibrillation (HCC) Rate controlled  Continue lopressor 25mg  Bid and eliquis   Essential hypertension Continue norvasc and lopressor   Hyperlipidemia Continue pravachol   Breast cancer ([place]) 07/24/2020: Left breast UIQ T1CN0 stage Ia grade 2 IDC ER/PR positive HER2 negative Ki-67 10% 09/06/2020: Left lumpectomy: T1CNX grade 2 IDC with negative margins, opted against radiation 11/26/2020: Genetics: Negative -continue anastrozole.     Advance Care Planning:   Code Status: DNR   Consults: PT/OT   DVT Prophylaxis: eliquis   Family Communication: niece at bedside: [name] Ward   Severity of Illness: The appropriate patient status for this patient  is INPATIENT. Inpatient status is judged to be reasonable and necessary in order to provide the required intensity of service to ensure the patient's safety. The patient's presenting symptoms, physical exam findings, and initial radiographic and laboratory data in the context of their chronic comorbidities is felt to place them at high risk for further clinical deterioration. Furthermore, it is not anticipated that the patient will be medically stable for discharge from the hospital within 2 midnights of admission.   * I certify that at the point of admission it is my clinical judgment that the patient will require inpatient hospital care spanning beyond 2 midnights from the point of admission due to high intensity of service, high risk for  further deterioration and high frequency of surveillance required.*  Author: Orma Flaming, MD 10/25/2022 6:24 PM  For on call review [email or web address].

## 2022-10-25 NOTE — Assessment & Plan Note (Signed)
Continue norvasc and lopressor

## 2022-10-25 NOTE — Plan of Care (Signed)
  Problem: Activity: Goal: Risk for activity intolerance will decrease Outcome: Progressing   Problem: Nutrition: Goal: Adequate nutrition will be maintained Outcome: Progressing   Problem: Coping: Goal: Level of anxiety will decrease Outcome: Progressing   Problem: Elimination: Goal: Will not experience complications related to urinary retention Outcome: Progressing   

## 2022-10-25 NOTE — Assessment & Plan Note (Signed)
Rate controlled  Continue lopressor 25mg  Bid and eliquis

## 2022-10-25 NOTE — Progress Notes (Signed)
NEW ADMISSION NOTE New Admission Note:   Arrival Method: stretcher Mental Orientation: A&O x 4 Telemetry: box 14 Assessment: Completed Skin: intact IV: Left and right nsl Pain: denies Tubes: none Safety Measures: Safety Fall Prevention Plan has been given, discussed and signed Admission: Completed [facility] Orientation: Patient has been orientated to the room, unit and staff.  Family: niece  Orders have been reviewed and implemented. Will continue to monitor the patient. Call light has been placed within reach and bed alarm has been activated.   Berneta Levins, RN

## 2022-10-25 NOTE — Assessment & Plan Note (Signed)
07/24/2020: Left breast UIQ T1CN0 stage Ia grade 2 IDC ER/PR positive HER2 negative Ki-67 10% 09/06/2020: Left lumpectomy: T1CNX grade 2 IDC with negative margins, opted against radiation 11/26/2020: Genetics: Negative -continue anastrozole.

## 2022-10-25 NOTE — Assessment & Plan Note (Signed)
presenting with SOB/hypoxia and dry cough with leukocytosis and asymmetrical interstitial infiltrates and airspace opacities, left greater than right and SIRS criteria with leukocytosis and tachypnea  -continue rocephin and azithromycin for CAP coverage -repeat 2V CXR tomorrow -check PCT -lactic acid wnl -BC pending  -urinary antigens pending  -xoponex PRN  -RVP Pending, covid/flu and RSV negative

## 2022-10-25 NOTE — ED Notes (Signed)
ED TO INPATIENT HANDOFF REPORT  ED Nurse Name and Phone #: Su Grand [phone]  S Name/Age/Gender Mallory Stephens 89 y.o. female Room/Bed: 004C/004C  Code Status   Code Status: Not on file  Home/SNF/Other Home Patient oriented to: self, place, time, and situation Is this baseline? Yes   Triage Complete: Triage complete  Chief Complaint SOB  Triage Note Pt to the ed from home via EMS with a CC of sob when she woke up. Pt denies any cp, loc, dizziness. Family relays they heard some wheezing. PT denies fever, chills, n/v/d. Pt has had cough over the last week   Allergies Allergies  Allergen Reactions   Sulfa Antibiotics     Level of Care/Admitting Diagnosis ED Disposition     ED Disposition  Admit   Condition  --   Comment  The patient appears reasonably stabilized for admission considering the current resources, flow, and capabilities available in the ED at this time, and I doubt any other [facility] requiring further screening and/or treatment in the ED prior to admission is  present.          B Medical/Surgery History History reviewed. No pertinent past medical history. History reviewed. No pertinent surgical history.   A IV Location/Drains/Wounds Patient Lines/Drains/Airways Status     Active Line/Drains/Airways     Name Placement date Placement time Site Days   Peripheral IV 10/25/22 20 G Anterior;Right Forearm 10/25/22  0919  Forearm  less than 1            Intake/Output Last 24 hours No intake or output data in the 24 hours ending 10/25/22 1050  Labs/Imaging Results for orders placed or performed during the hospital encounter of 10/25/22 (from the past 48 hour(s))  Comprehensive metabolic panel     Status: Abnormal   Collection Time: 10/25/22  8:04 AM  Result Value Ref Range   Sodium 140 135 - 145 mmol/L   Potassium 3.3 (L) 3.5 - 5.1 mmol/L   Chloride 106 98 - 111 mmol/L   CO2 23 22 - 32 mmol/L   Glucose, Bld 126 (H) 70 - 99 mg/dL     Comment: Glucose reference range applies only to samples taken after fasting for at least 8 hours.   BUN 21 8 - 23 mg/dL   Creatinine, Ser 1.08 (H) 0.44 - 1.00 mg/dL   Calcium 9.5 8.9 - 10.3 mg/dL   Total Protein 6.3 (L) 6.5 - 8.1 g/dL   Albumin 3.6 3.5 - 5.0 g/dL   AST 16 15 - 41 U/L   ALT 13 0 - 44 U/L   Alkaline Phosphatase 43 38 - 126 U/L   Total Bilirubin 1.3 (H) 0.3 - 1.2 mg/dL   GFR, Estimated 50 (L) >60 mL/min    Comment: (NOTE) Calculated using the CKD-EPI Creatinine Equation (2021)    Anion gap 11 5 - 15    Comment: Performed at [facility] [address] [place], [place] [address]  CBC with Differential     Status: Abnormal   Collection Time: 10/25/22  8:04 AM  Result Value Ref Range   WBC 15.9 (H) 4.0 - 10.5 K/uL   RBC 3.55 (L) 3.87 - 5.11 MIL/uL   Hemoglobin 8.8 (L) 12.0 - 15.0 g/dL   HCT 28.8 (L) 36.0 - 46.0 %   MCV 81.1 80.0 - 100.0 fL   MCH 24.8 (L) 26.0 - 34.0 pg   MCHC 30.6 30.0 - 36.0 g/dL   RDW 15.1 11.5 - 15.5 %  Platelets 361 150 - 400 K/uL   nRBC 0.0 0.0 - 0.2 %   Neutrophils Relative % 68 %   Neutro Abs 10.9 (H) 1.7 - 7.7 K/uL   Lymphocytes Relative 22 %   Lymphs Abs 3.5 0.7 - 4.0 K/uL   Monocytes Relative 7 %   Monocytes Absolute 1.0 0.1 - 1.0 K/uL   Eosinophils Relative 1 %   Eosinophils Absolute 0.2 0.0 - 0.5 K/uL   Basophils Relative 1 %   Basophils Absolute 0.1 0.0 - 0.1 K/uL   Immature Granulocytes 1 %   Abs Immature Granulocytes 0.09 (H) 0.00 - 0.07 K/uL    Comment: Performed at [facility] [address], [place], [place] [address]  Troponin I (High Sensitivity)     Status: None   Collection Time: 10/25/22  8:04 AM  Result Value Ref Range   Troponin I (High Sensitivity) 8 <18 ng/L    Comment: (NOTE) Elevated high sensitivity troponin I (hsTnI) values and significant  changes across serial measurements may suggest ACS but many other  chronic and acute conditions are known to elevate hsTnI results.  Refer to the "Links"  section for chest pain algorithms and additional  guidance. Performed at [facility], [place] [address], [place], [place] [address]   Brain natriuretic peptide     Status: Abnormal   Collection Time: 10/25/22  8:04 AM  Result Value Ref Range   B Natriuretic Peptide 613.0 (H) 0.0 - 100.0 pg/mL    Comment: Performed at [facility] [address] [place], [place] [address]  I-Stat venous blood gas, ED     Status: Abnormal   Collection Time: 10/25/22  8:18 AM  Result Value Ref Range   pH, Ven 7.346 7.25 - 7.43   pCO2, Ven 44.6 44 - 60 mmHg   pO2, Ven 49 (H) 32 - 45 mmHg   Bicarbonate 24.4 20.0 - 28.0 mmol/L   TCO2 26 22 - 32 mmol/L   O2 Saturation 82 %   Acid-base deficit 1.0 0.0 - 2.0 mmol/L   Sodium 141 135 - 145 mmol/L   Potassium 3.4 (L) 3.5 - 5.1 mmol/L   Calcium, Ion 1.32 1.15 - 1.40 mmol/L   HCT 28.0 (L) 36.0 - 46.0 %   Hemoglobin 9.5 (L) 12.0 - 15.0 g/dL   Sample type VENOUS   Resp panel by RT-PCR (RSV, Flu A&B, Covid) Anterior Nasal Swab     Status: None   Collection Time: 10/25/22  9:16 AM   Specimen: Anterior Nasal Swab  Result Value Ref Range   SARS Coronavirus 2 by RT PCR NEGATIVE NEGATIVE   Influenza A by PCR NEGATIVE NEGATIVE   Influenza B by PCR NEGATIVE NEGATIVE    Comment: (NOTE) The Xpert Xpress SARS-CoV-2/FLU/RSV plus assay is intended as an aid in the diagnosis of influenza from Nasopharyngeal swab specimens and should not be used as a sole basis for treatment. Nasal washings and aspirates are unacceptable for Xpert Xpress SARS-CoV-2/FLU/RSV testing.  Fact Sheet for Patients: EntrepreneurPulse.com.au  Fact Sheet for Healthcare Providers: IncredibleEmployment.be  This test is not yet approved or cleared by the [place] FDA and has been authorized for detection and/or diagnosis of SARS-CoV-2 by FDA under an Emergency Use Authorization (EUA). This EUA will remain in effect (meaning this test can be  used) for the duration of the COVID-19 declaration under Section 564(b)(1) of the Act, 21 U.S.C. section 360bbb-3(b)(1), unless the authorization is terminated or revoked.  Resp Syncytial Virus by PCR NEGATIVE NEGATIVE    Comment: (NOTE) Fact Sheet for Patients: EntrepreneurPulse.com.au  Fact Sheet for Healthcare Providers: IncredibleEmployment.be  This test is not yet approved or cleared by the [place] FDA and has been authorized for detection and/or diagnosis of SARS-CoV-2 by FDA under an Emergency Use Authorization (EUA). This EUA will remain in effect (meaning this test can be used) for the duration of the COVID-19 declaration under Section 564(b)(1) of the Act, 21 U.S.C. section 360bbb-3(b)(1), unless the authorization is terminated or revoked.  Performed at [facility], [place] [address] [place], [place] [address]    DG Chest Port 1 View  Result Date: 10/25/2022 CLINICAL DATA:  Cough and shortness of breath EXAM: PORTABLE CHEST - 1 VIEW COMPARISON:  None available FINDINGS: Relatively low lung volumes. Interstitial and airspace opacities involving bases more than apices, left greater than right. Mild cardiomegaly.  Aortic Atherosclerosis (ICD10-170.0). No effusion. Surgical clips project over the left mid lung. Regional bones unremarkable. IMPRESSION: 1. Asymmetric interstitial and airspace opacities, left greater than right. 2. Mild cardiomegaly. Electronically Signed   By: Lucrezia Europe M.D.   On: 10/25/2022 08:51    Pending Labs Unresulted Labs (From admission, onward)     Start     Ordered   10/25/22 0857  Blood culture (routine x 2)  BLOOD CULTURE X 2,   R (with STAT occurrences)      10/25/22 0856   10/25/22 0857  Lactic acid, plasma  Now then every 2 hours,   R (with STAT occurrences)      10/25/22 0856            Vitals/Pain Today's Vitals   10/25/22 0801 10/25/22 0802 10/25/22 0830 10/25/22 0836  BP: (!)  197/82  (!) 156/70   Pulse: 94  86   Resp: 16  (!) 25   Temp: 98.5 F (36.9 C)     TempSrc: Oral     SpO2: (!) 84% 96% 95% 93%  Weight:      Height:      PainSc:        Isolation Precautions Airborne and Contact precautions  Medications Medications  azithromycin (ZITHROMAX) 500 mg in sodium chloride 0.9 % 250 mL IVPB (500 mg Intravenous New Bag/Given 10/25/22 1012)  ipratropium-albuterol (DUONEB) 0.5-2.5 (3) MG/3ML nebulizer solution 3 mL (3 mLs Nebulization Given 10/25/22 0836)  cefTRIAXone (ROCEPHIN) 2 g in sodium chloride 0.9 % 100 mL IVPB (0 g Intravenous Stopped 10/25/22 1011)    Mobility walks     Focused Assessments Pulmonary Assessment Handoff:  Lung sounds: Bilateral Breath Sounds: Inspiratory wheezes O2 Device: Nasal Cannula O2 Flow Rate (L/min): 4 L/min    R Recommendations: See Admitting Provider Note  Report given to:   Additional Notes:  pt does not use oxygen at home

## 2022-10-26 DIAGNOSIS — J9601 Acute respiratory failure with hypoxia: Secondary | ICD-10-CM | POA: Diagnosis not present

## 2022-10-26 LAB — CBC
HCT: 26 % — ABNORMAL LOW (ref 36.0–46.0)
Hemoglobin: 7.5 g/dL — ABNORMAL LOW (ref 12.0–15.0)
MCH: 24 pg — ABNORMAL LOW (ref 26.0–34.0)
MCHC: 28.8 g/dL — ABNORMAL LOW (ref 30.0–36.0)
MCV: 83.3 fL (ref 80.0–100.0)
Platelets: 282 10*3/uL (ref 150–400)
RBC: 3.12 MIL/uL — ABNORMAL LOW (ref 3.87–5.11)
RDW: 15.3 % (ref 11.5–15.5)
WBC: 11.3 10*3/uL — ABNORMAL HIGH (ref 4.0–10.5)
nRBC: 0 % (ref 0.0–0.2)

## 2022-10-26 LAB — POCT I-STAT EG7
Acid-base deficit: 1 mmol/L (ref 0.0–2.0)
Bicarbonate: 24.4 mmol/L (ref 20.0–28.0)
Calcium, Ion: 1.32 mmol/L (ref 1.15–1.40)
HCT: 28 % — ABNORMAL LOW (ref 36.0–46.0)
Hemoglobin: 9.5 g/dL — ABNORMAL LOW (ref 12.0–15.0)
O2 Saturation: 82 %
Potassium: 3.4 mmol/L — ABNORMAL LOW (ref 3.5–5.1)
Sodium: 141 mmol/L (ref 135–145)
TCO2: 26 mmol/L (ref 22–32)
pCO2, Ven: 44.6 mmHg (ref 44–60)
pH, Ven: 7.346 (ref 7.25–7.43)
pO2, Ven: 49 mmHg — ABNORMAL HIGH (ref 32–45)

## 2022-10-26 LAB — BASIC METABOLIC PANEL
Anion gap: 7 (ref 5–15)
BUN: 17 mg/dL (ref 8–23)
CO2: 26 mmol/L (ref 22–32)
Calcium: 9 mg/dL (ref 8.9–10.3)
Chloride: 105 mmol/L (ref 98–111)
Creatinine, Ser: 1.03 mg/dL — ABNORMAL HIGH (ref 0.44–1.00)
GFR, Estimated: 53 mL/min — ABNORMAL LOW (ref >60)
Glucose, Bld: 126 mg/dL — ABNORMAL HIGH (ref 70–99)
Potassium: 3.5 mmol/L (ref 3.5–5.1)
Sodium: 138 mmol/L (ref 135–145)

## 2022-10-26 LAB — PROCALCITONIN: Procalcitonin: <0.1 ng/mL

## 2022-10-26 NOTE — Progress Notes (Signed)
Physical Therapy Note  (Full note to follow)  SATURATION QUALIFICATIONS: (This note is used to comply with regulatory documentation for home oxygen)  Patient Saturations on Room Air at Rest = 89%  Patient Saturations on Room Air while Ambulating = 86%  Patient Saturations on 3 Liters of oxygen while Ambulating = 95%  Please briefly explain why patient needs home oxygen: Patient requires supplemental oxygen to maintain oxygen saturations at acceptable, safe levels with physical activity.  [name], [place] Office [phone]

## 2022-10-26 NOTE — Progress Notes (Signed)
PROGRESS NOTE Mallory Stephens  [id number] DOB: Jan 15, 1933 DOA: 10/25/2022 PCP: [name], MD  Brief Narrative/Hospital Course: 89 y.o.f w/ permanent atrial fibrillation, HTN, HLD, breast cancer who presented to ED 3/23 with c/o of shortness of breath since 3/22 and got progressively worse.  She woke her sister up around 5AM because she couldn't breath. She was laboring to breath and was down to 79% on room air at home with home pulse ox. She has had a cough for a few weeks that is dry in nature.  She has had no fever/chills. She has had some shortness of breath with this cough at the end of January with exertion  She has had worsening dyspnea on exertion over the last few days. No overt swelling in her legs and +orthopnea most noticed last night.  She has had no weight gain. No sick contacts. She has been nauseated x 2 weeks, no vomiting.  In [id number]: afebrile, bp: 152/141, HR: 80, RR: 22, oxygen: 94% on 4L [place]. Hypoxic to 84% with EMS,labs: wbc: 15.9, hgb: 8.8, potassium: 3.3, creatinine: 1.08, bnp: 613 CXR: 1. Asymmetric interstitial and airspace opacities, left greater than right. 2. Mild cardiomegaly.  Patient was given DuoNeb antibiotics blood cultures obtained and admitted for further management   Subjective: Seen and examined, overnight vitals/labs/events reviewed  Overnight afebrile, this morning labs shows improving WBC count procalcitonin negative x 2 Feeling some better but is still on oxygen at 4 L about 2 get to work with physical therapy  Assessment and Plan: Principal Problem:   Acute respiratory failure with hypoxia secondary to CAP vs/+ new CHF exacerbation Active Problems:   CAP (community acquired pneumonia)   Normocytic anemia   Hypokalemia   Permanent atrial fibrillation (HCC)   Essential hypertension   Hyperlipidemia   Breast cancer (HCC)   CHF exacerbation (HCC)   Acute hypoxic respiratory failure Shortness of breath: Hypoxic 79% on room air at home and  84% for EMS, differential includes CAP versus CHF.  BNP elevated 613 chest x-ray cardiomegaly with interstitial opacities and labs with leukocytosis> most likely CHF. Interestingly procalcitonin is negative-but for now continue empiric antibiotic, IV Lasix.  Follow-up culture data urine antigens strep pneumonia antigen is negative.  Per niece patient had echo done recently beginning of March 1 by Dr. Lovena Le -reviewed showed EF 60 to 65% - will get limited echo for lvef- monitor intake output, daily weight, continue salt restricted diet, wean o2 as tolerated.  Normocytic anemia: Iron studies shows normal ferritin iron 16, add po iron Hypokalemia: Stable, mag stable Permanent atrial fibrillation: Rate controlled continue Lopressor, home Eliquis, monitoring telemetry Essential hypertension: stable on losartan and lopressor  Hyperlipidemia: cont statins  Breast cancer:07/24/2020: Left breast UIQ T1CN0 stage Ia grade 2 IDC ER/PR positive HER2 negative Ki-67 10% 09/06/2020: Left lumpectomy: T1CNX grade 2 IDC with negative margins, opted against radiation, 11/26/2020: Genetics: Negative. Cont her anastrozole.   DVT prophylaxis: eliquis Code Status:   Code Status: DNR Family Communication: plan of care discussed with patien at bedside. Patient status is:  inpatient  because of hypoxic respiratory failure Level of care: Telemetry Medical   Dispo: The patient is from: home            Anticipated disposition: TBD Objective: Vitals last 24 hrs: Vitals:   10/25/22 1707 10/25/22 2057 10/26/22 0148 10/26/22 0651  BP: (!) 161/71 (!) 145/74 (!) 143/76 (!) 137/98  Pulse: 77 76 66 87  Resp: 18 18 18 16   Temp: 98.2 F (  36.8 C) 98.4 F (36.9 C) 98.2 F (36.8 C) 97.7 F (36.5 C)  TempSrc:  Oral Oral Oral  SpO2: 99% 97% 94% 99%  Weight:      Height:       Weight change:   Physical Examination: General exam: alert awake, older than stated age HEENT:Oral mucosa moist, Ear/Nose WNL grossly Respiratory  system: bilaterally crackles on lung at basese,no use of accessory muscle Cardiovascular system: S1 & S2 +, No JVD. Gastrointestinal system: Abdomen soft,NT,ND, BS+ Nervous System:Alert, awake, moving extremities. Extremities: LE edema neg,distal peripheral pulses palpable.  Skin: No rashes,no icterus. MSK: Normal muscle bulk,tone, power  Medications reviewed:  Scheduled Meds:  anastrozole  1 mg Oral Daily   apixaban  5 mg Oral BID   famotidine  40 mg Oral QHS   furosemide  20 mg Intravenous Daily   losartan  100 mg Oral Daily   metoprolol tartrate  25 mg Oral BID   PARoxetine  10 mg Oral Daily   pravastatin  20 mg Oral q1800   sodium chloride flush  3 mL Intravenous Q12H   Continuous Infusions:  sodium chloride     azithromycin     cefTRIAXone (ROCEPHIN)  IV        Diet Order             Diet Heart Room service appropriate? Yes; Fluid consistency: Thin  Diet effective now                  Intake/Output Summary (Last 24 hours) at 10/26/2022 0918 Last data filed at 10/25/2022 1700 Gross per 24 hour  Intake 673.67 ml  Output 300 ml  Net 373.67 ml   Net IO Since Admission: 373.67 mL [10/26/22 0918]  Wt Readings from Last 3 Encounters:  10/25/22 68 kg     Unresulted Labs (From admission, onward)     Start     Ordered   10/25/22 1221  Expectorated Sputum Assessment w Gram Stain, Rflx to Resp Cult  (COPD / Pneumonia / Cellulitis / Lower Extremity Wound)  Once,   R        10/25/22 1222   10/25/22 1221  Legionella Pneumophila Serogp 1 Ur Ag  (COPD / Pneumonia / Cellulitis / Lower Extremity Wound)  Once,   R        10/25/22 1222   10/25/22 1124  Occult blood card to lab, stool RN will collect  Once,   URGENT       Question:  Specimen to be collected by:  Answer:  RN will collect   10/25/22 1123          Data Reviewed: I have personally reviewed following labs and imaging studies CBC: Recent Labs  Lab 10/25/22 0804 10/25/22 0818 10/26/22 0326  WBC 15.9*  --   11.3*  NEUTROABS 10.9*  --   --   HGB 8.8* 9.5* 7.5*  HCT 28.8* 28.0* 26.0*  MCV 81.1  --  83.3  PLT 361  --  [id number]   Basic Metabolic Panel: Recent Labs  Lab 10/25/22 0804 10/25/22 0818 10/25/22 1306 10/26/22 0326  NA 140 141  --  138  K 3.3* 3.4*  --  3.5  CL 106  --   --  105  CO2 23  --   --  26  GLUCOSE 126*  --   --  126*  BUN 21  --   --  17  CREATININE 1.08*  --   --  1.03*  CALCIUM 9.5  --   --  9.0  MG  --   --  1.7  --    GFR: Estimated Creatinine Clearance: 34.6 mL/min (A) (by C-G formula based on SCr of 1.03 mg/dL (H)). Liver Function Tests: Recent Labs  Lab 10/25/22 0804  AST 16  ALT 13  ALKPHOS 43  BILITOT 1.3*  PROT 6.3*  ALBUMIN 3.6  Sepsis Labs: Recent Labs  Lab 10/25/22 1010 10/25/22 1306 10/26/22 0326  PROCALCITON  --  <0.10 <0.10  LATICACIDVEN 1.2 1.2  --     Recent Results (from the past 240 hour(s))  Blood culture (routine x 2)     Status: None (Preliminary result)   Collection Time: 10/25/22  9:00 AM   Specimen: BLOOD  Result Value Ref Range Status   Specimen Description BLOOD SITE NOT SPECIFIED  Final   Special Requests   Final    BOTTLES DRAWN AEROBIC AND ANAEROBIC Blood Culture results may not be optimal due to an inadequate volume of blood received in culture bottles   Culture   Final    NO GROWTH < 24 HOURS Performed at [facility], [place] [address] [place], [place] [address]    Report Status PENDING  Incomplete  Resp panel by RT-PCR (RSV, Flu A&B, Covid) Anterior Nasal Swab     Status: None   Collection Time: 10/25/22  9:16 AM   Specimen: Anterior Nasal Swab  Result Value Ref Range Status   SARS Coronavirus 2 by RT PCR NEGATIVE NEGATIVE Final   Influenza A by PCR NEGATIVE NEGATIVE Final   Influenza B by PCR NEGATIVE NEGATIVE Final    Comment: (NOTE) The Xpert Xpress SARS-CoV-2/FLU/RSV plus assay is intended as an aid in the diagnosis of influenza from Nasopharyngeal swab specimens and should not be used as a sole  basis for treatment. Nasal washings and aspirates are unacceptable for Xpert Xpress SARS-CoV-2/FLU/RSV testing.  Fact Sheet for Patients: EntrepreneurPulse.com.au  Fact Sheet for Healthcare Providers: IncredibleEmployment.be  This test is not yet approved or cleared by the [place] FDA and has been authorized for detection and/or diagnosis of SARS-CoV-2 by FDA under an Emergency Use Authorization (EUA). This EUA will remain in effect (meaning this test can be used) for the duration of the COVID-19 declaration under Section 564(b)(1) of the Act, 21 U.S.C. section 360bbb-3(b)(1), unless the authorization is terminated or revoked.     Resp Syncytial Virus by PCR NEGATIVE NEGATIVE Final    Comment: (NOTE) Fact Sheet for Patients: EntrepreneurPulse.com.au  Fact Sheet for Healthcare Providers: IncredibleEmployment.be  This test is not yet approved or cleared by the [place] FDA and has been authorized for detection and/or diagnosis of SARS-CoV-2 by FDA under an Emergency Use Authorization (EUA). This EUA will remain in effect (meaning this test can be used) for the duration of the COVID-19 declaration under Section 564(b)(1) of the Act, 21 U.S.C. section 360bbb-3(b)(1), unless the authorization is terminated or revoked.  Performed at [facility], [place] [address], [place], [place] [address]   Blood culture (routine x 2)     Status: None (Preliminary result)   Collection Time: 10/25/22  9:18 AM   Specimen: BLOOD  Result Value Ref Range Status   Specimen Description BLOOD SITE NOT SPECIFIED  Final   Special Requests   Final    BOTTLES DRAWN AEROBIC AND ANAEROBIC Blood Culture results may not be optimal due to an inadequate volume of blood received in culture bottles   Culture  Final    NO GROWTH < 24 HOURS Performed at [facility], [place] [address] [place], [place] [address]    Report  Status PENDING  Incomplete  Respiratory (~20 pathogens) panel by PCR     Status: None   Collection Time: 10/25/22  1:31 PM   Specimen: Nasopharyngeal Swab; Respiratory  Result Value Ref Range Status   Adenovirus NOT DETECTED NOT DETECTED Final   Coronavirus 229E NOT DETECTED NOT DETECTED Final    Comment: (NOTE) The Coronavirus on the Respiratory Panel, DOES NOT test for the novel  Coronavirus (2019 nCoV)    Coronavirus HKU1 NOT DETECTED NOT DETECTED Final   Coronavirus NL63 NOT DETECTED NOT DETECTED Final   Coronavirus OC43 NOT DETECTED NOT DETECTED Final   Metapneumovirus NOT DETECTED NOT DETECTED Final   Rhinovirus / Enterovirus NOT DETECTED NOT DETECTED Final   Influenza A NOT DETECTED NOT DETECTED Final   Influenza B NOT DETECTED NOT DETECTED Final   Parainfluenza Virus 1 NOT DETECTED NOT DETECTED Final   Parainfluenza Virus 2 NOT DETECTED NOT DETECTED Final   Parainfluenza Virus 3 NOT DETECTED NOT DETECTED Final   Parainfluenza Virus 4 NOT DETECTED NOT DETECTED Final   Respiratory Syncytial Virus NOT DETECTED NOT DETECTED Final   Bordetella pertussis NOT DETECTED NOT DETECTED Final   Bordetella Parapertussis NOT DETECTED NOT DETECTED Final   Chlamydophila pneumoniae NOT DETECTED NOT DETECTED Final   Mycoplasma pneumoniae NOT DETECTED NOT DETECTED Final    Comment: Performed at [facility], [place] [address], [place], [place] [address]    Antimicrobials: Anti-infectives (From admission, onward)    Start     Dose/Rate Route Frequency Ordered Stop   10/26/22 1000  cefTRIAXone (ROCEPHIN) 2 g in sodium chloride 0.9 % 100 mL IVPB  Status:  Discontinued        2 g 200 mL/hr over 30 Minutes Intravenous Every 24 hours 10/25/22 1222 10/25/22 1236   10/26/22 1000  azithromycin (ZITHROMAX) 500 mg in sodium chloride 0.9 % 250 mL IVPB        500 mg 250 mL/hr over 60 Minutes Intravenous Every 24 hours 10/25/22 1222 10/30/22 0959   10/26/22 1000  cefTRIAXone (ROCEPHIN) 2 g in sodium  chloride 0.9 % 100 mL IVPB        2 g 200 mL/hr over 30 Minutes Intravenous Every 24 hours 10/25/22 1236 10/30/22 0959   10/25/22 0900  cefTRIAXone (ROCEPHIN) 2 g in sodium chloride 0.9 % 100 mL IVPB        2 g 200 mL/hr over 30 Minutes Intravenous  Once 10/25/22 0856 10/25/22 1011   10/25/22 0900  azithromycin (ZITHROMAX) 500 mg in sodium chloride 0.9 % 250 mL IVPB        500 mg 250 mL/hr over 60 Minutes Intravenous  Once 10/25/22 0856 10/25/22 1115      Culture/Microbiology    Component Value Date/Time   SDES BLOOD SITE NOT SPECIFIED 10/25/2022 0918   SPECREQUEST  10/25/2022 [id number]    BOTTLES DRAWN AEROBIC AND ANAEROBIC Blood Culture results may not be optimal due to an inadequate volume of blood received in culture bottles   CULT  10/25/2022 0918    NO GROWTH < 24 HOURS Performed at [facility], [place] [address], [place], [place] [address]    REPTSTATUS PENDING 10/25/2022 [id number]  Radiology Studies: [facility] Chest [facility] 1 View  Result Date: 10/25/2022 CLINICAL DATA:  Cough and shortness of breath EXAM: PORTABLE CHEST - 1  VIEW COMPARISON:  None available FINDINGS: Relatively low lung volumes. Interstitial and airspace opacities involving bases more than apices, left greater than right. Mild cardiomegaly.  Aortic Atherosclerosis (ICD10-170.0). No effusion. Surgical clips project over the left mid lung. Regional bones unremarkable. IMPRESSION: 1. Asymmetric interstitial and airspace opacities, left greater than right. 2. Mild cardiomegaly. Electronically Signed   By: Lucrezia Europe M.D.   On: 10/25/2022 08:51     LOS: 1 day   Antonieta Pert, MD [facility]  10/26/2022, 9:18 AM

## 2022-10-26 NOTE — Progress Notes (Signed)
Mobility Specialist Progress Note:   10/26/22 1629  Mobility  Activity Ambulated with assistance in hallway  Level of Assistance Standby assist, set-up cues, supervision of patient - no hands on  Assistive Device Front wheel walker  Distance Ambulated (ft) 200 ft  Activity Response Tolerated well  $Mobility charge 1 Mobility   Pt in chair willing to participate in mobility. No complaints of pain. Left in chair with call bell in reach and all needs met.   Gareth Eagle Benito Lemmerman Mobility Specialist Please contact via [employer] or  Rehab Office at [phone]

## 2022-10-26 NOTE — Evaluation (Signed)
Physical Therapy Evaluation Patient Details Name: Mallory Stephens MRN: [id number] DOB: Aug 27, 1932 Today's Date: 10/26/2022  History of Present Illness  Patient is a 89 yo female presenting to the ED with SOB on 10/25/22. PMH includes:atrial fibrillation, HTN, HLD, breast cancer  Clinical Impression   Pt admitted with above diagnosis. Lives at home with 10 yo sister (who drives), in a two -level home (stays on main floor) with a few steps to enter; Prior to admission, pt was able to manage walking overall modified independently, furniture cruising instead of using assistive device (reports she has rollator RW and "canes in every corner" of her home); Presents to PT with generalized weakness and decr activity tolerance, and Patient requires supplemental oxygen to maintain oxygen saturations at acceptable, safe levels with physical activity; Interestingly, it took quite a while for pt to desat with walking, and I'm hopeful that she won't need supplemental O2 for very long; Needs minguard for transfers and walking household distances;  Pt currently with functional limitations due to the deficits listed below (see PT Problem List). Pt will benefit from skilled PT to increase their independence and safety with mobility to allow discharge to the venue listed below.          Recommendations for follow up therapy are one component of a multi-disciplinary discharge planning process, led by the attending physician.  Recommendations may be updated based on patient status, additional functional criteria and insurance authorization.  Follow Up Recommendations Home health PT (and restart OuptPT after [place] course)      Assistance Recommended at Discharge Intermittent Supervision/Assistance  Patient can return home with the following  A little help with walking and/or transfers;A little help with bathing/dressing/bathroom    Equipment Recommendations Other (comment) (May need supplemental O2; will need to recheck  O2 qualifyiing walk over next few sessions)  Recommendations for Other Services  Other (comment) (Mobility)    Functional Status Assessment Patient has had a recent decline in their functional status and demonstrates the ability to make significant improvements in function in a reasonable and predictable amount of time.     Precautions / Restrictions Precautions Precautions: Fall Restrictions Weight Bearing Restrictions: No Other Position/Activity Restrictions: Wearing 4L O2 upon arrival; does not usually wear oxygen      Mobility  Bed Mobility Overal bed mobility: Needs Assistance Bed Mobility: Supine to Sit, Sit to Supine     Supine to sit: Supervision Sit to supine: Supervision   General bed mobility comments: increased momentum needed from trunk to come into sitting from flat surface, but able to complete without assist    Transfers Overall transfer level: Needs assistance Equipment used: Rolling walker (2 wheels) Transfers: Sit to/from Stand Sit to Stand: Min guard           General transfer comment: min gaurd for sit<>stands, handed off to PT for functional mobility    Ambulation/Gait Ambulation/Gait assistance: Min guard (with and without physical contact) Gait Distance (Feet): 80 Feet Assistive device: Rolling walker (2 wheels) Gait Pattern/deviations: Step-through pattern Gait velocity: a little slow     General Gait Details: Cues to self-monitor for activity tolerance; one standing break during walk, leaning against teh wall  Stairs            Wheelchair Mobility    Modified Rankin (Stroke Patients Only)       Balance Overall balance assessment: Mild deficits observed, not formally tested  Pertinent Vitals/Pain Pain Assessment Pain Assessment: No/denies pain    Home Living Family/patient expects to be discharged to:: Private residence Living Arrangements: Other relatives  (Sister) Available Help at Discharge: Available 24 hours/day;Available PRN/intermittently (Other family available to assist but still work) Type of Home: House Home Access: Stairs to enter Entrance Stairs-Rails: Right Entrance Stairs-Number of Steps: 3 Alternate Level Stairs-Number of Steps: stair lift to upstairs Home Layout: Two level;Full bath on main level (lives on first floor) Home Equipment: Conservation officer, nature (2 wheels);Cane - single point;Grab bars - tub/shower;Shower seat      Prior Function Prior Level of Function : Needs assist;History of Falls (last six months)             Mobility Comments: furniture surfs, one fall in the last six months ADLs Comments: independent     Hand Dominance   Dominant Hand: Right    Extremity/Trunk Assessment   Upper Extremity Assessment Upper Extremity Assessment: Defer to OT evaluation    Lower Extremity Assessment Lower Extremity Assessment: Generalized weakness    Cervical / Trunk Assessment Cervical / Trunk Assessment: Normal  Communication   Communication: No difficulties  Cognition Arousal/Alertness: Awake/alert Behavior During Therapy: WFL for tasks assessed/performed Overall Cognitive Status: Within Functional Limits for tasks assessed                                          General Comments General comments (skin integrity, edema, etc.): Pt reports hsitory of orthostasis; standing BP mid walk 150/76; see other PT note for O2 sat walk    Exercises     Assessment/Plan    PT Assessment Patient needs continued PT services  PT Problem List Decreased strength;Decreased activity tolerance;Decreased balance;Decreased mobility;Decreased knowledge of use of DME;Decreased safety awareness;Decreased knowledge of precautions;Cardiopulmonary status limiting activity       PT Treatment Interventions DME instruction;Gait training;Stair training;Functional mobility training;Therapeutic activities;Therapeutic  exercise;Balance training;Patient/family education    PT Goals (Current goals can be found in the Care Plan section)  Acute Rehab PT Goals Patient Stated Goal: Agreeable to walk PT Goal Formulation: With patient Time For Goal Achievement: 11/03/22 Potential to Achieve Goals: Good    Frequency Min 3X/week     Co-evaluation               AM-PAC PT "6 Clicks" Mobility  Outcome Measure Help needed turning from your back to your side while in a flat bed without using bedrails?: A Little Help needed moving from lying on your back to sitting on the side of a flat bed without using bedrails?: A Little Help needed moving to and from a bed to a chair (including a wheelchair)?: A Little Help needed standing up from a chair using your arms (e.g., wheelchair or bedside chair)?: A Little Help needed to walk in hospital room?: A Little Help needed climbing 3-5 steps with a railing? : A Little 6 Click Score: 18    End of Session Equipment Utilized During Treatment: Gait belt;Oxygen Activity Tolerance: Patient tolerated treatment well Patient left: in chair;with call bell/phone within reach;with chair alarm set;with family/visitor present Nurse Communication: Mobility status PT Visit Diagnosis: Unsteadiness on feet (R26.81) (decr functional capacity)    Time: [id number] PT Time Calculation (min) (ACUTE ONLY): 27 min   Charges:   PT Evaluation $PT Eval Low Complexity: 1 Low PT Treatments $Gait Training: 8-22 mins  [name], PT  Acute Rehabilitation Services Office [phone]   Colletta Maryland 10/26/2022, 1:01 PM

## 2022-10-26 NOTE — Hospital Course (Signed)
89 y.o.f w/ permanent atrial fibrillation, HTN, HLD, breast cancer who presented to ED 3/23 with c/o of shortness of breath since 3/22 and got progressively worse.  She woke her sister up around 5AM because she couldn't breath. She was laboring to breath and was down to 79% on room air at home with home pulse ox. She has had a cough for a few weeks that is dry in nature.  She has had no fever/chills. She has had some shortness of breath with this cough at the end of January with exertion  She has had worsening dyspnea on exertion over the last few days. No overt swelling in her legs and +orthopnea most noticed last night.  She has had no weight gain. No sick contacts. She has been nauseated x 2 weeks, no vomiting.  In [id number]: afebrile, bp: 152/141, HR: 80, RR: 22, oxygen: 94% on 4L [place]. Hypoxic to 84% with EMS,labs: wbc: 15.9, hgb: 8.8, potassium: 3.3, creatinine: 1.08, bnp: 613 CXR: 1. Asymmetric interstitial and airspace opacities, left greater than right. 2. Mild cardiomegaly.  Patient was given DuoNeb antibiotics blood cultures obtained and admitted for further management

## 2022-10-26 NOTE — Evaluation (Signed)
Occupational Therapy Evaluation Patient Details Name: Mallory Stephens MRN: [id number] DOB: [birth year] Today's Date: 10/26/2022   History of Present Illness Patient is a 89 yo female presenting to the ED with SOB on 10/25/22. PMH includes:atrial fibrillation, HTN, HLD, breast cancer   Clinical Impression   Prior to this admission, patient living with her 41 year old sister (sister still drives) and able to complete ADLs and functional mobility independently. Patient endorses one fall in the last six months, and "furniture surfs" instead of using AD for functional mobility. Currently, patient presenting with decreased activity tolerance, and need for 4L of O2 to complete ADLs and functional mobility, and minimal need for increased assist to complete ADLs. Patient supervision for bed mobility, min guard for transfers, and min A for ADLs. OT recommending BSC for home use due to increased effort to ambulate short distances with difficulty getting to the bathroom at home. Patient would benefit from additional services at discharge, OT will continue to follow acutely.      Recommendations for follow up therapy are one component of a multi-disciplinary discharge planning process, led by the attending physician.  Recommendations may be updated based on patient status, additional functional criteria and insurance authorization.   Follow Up Recommendations  Home health OT     Assistance Recommended at Discharge Frequent or constant Supervision/Assistance (initially)  Patient can return home with the following A little help with walking and/or transfers;A little help with bathing/dressing/bathroom;Assistance with cooking/housework;Assist for transportation;Help with stairs or ramp for entrance    Functional Status Assessment  Patient has had a recent decline in their functional status and demonstrates the ability to make significant improvements in function in a reasonable and predictable amount of time.   Equipment Recommendations  BSC/3in1    Recommendations for Other Services       Precautions / Restrictions Precautions Precautions: Fall Restrictions Weight Bearing Restrictions: No Other Position/Activity Restrictions: Wearing 4L O2 does not usually wear oxygen      Mobility Bed Mobility Overal bed mobility: Needs Assistance Bed Mobility: Supine to Sit, Sit to Supine     Supine to sit: Supervision Sit to supine: Supervision   General bed mobility comments: increased momentum needed from trunk to come into sitting from flat surface, but able to complete without assist    Transfers Overall transfer level: Needs assistance Equipment used: Rolling walker (2 wheels) Transfers: Sit to/from Stand Sit to Stand: Min guard           General transfer comment: min gaurd for sit<>stands, handed off to PT for functional mobility      Balance Overall balance assessment: Mild deficits observed, not formally tested                                         ADL either performed or assessed with clinical judgement   ADL Overall ADL's : Needs assistance/impaired Eating/Feeding: Set up;Sitting   Grooming: Set up;Sitting   Upper Body Bathing: Set up;Sitting   Lower Body Bathing: Minimal assistance;Sitting/lateral leans;Sit to/from stand   Upper Body Dressing : Set up;Sitting   Lower Body Dressing: Minimal assistance;Sitting/lateral leans;Sit to/from stand   Toilet Transfer: Min guard;Ambulation;Rolling walker (2 wheels)   Toileting- Clothing Manipulation and Hygiene: Min guard;Sit to/from stand;Sitting/lateral lean       Functional mobility during ADLs: Min guard;Cueing for sequencing;Cueing for safety;Rolling walker (2 wheels) General ADL Comments: Patient presenting  with decreased activity tolerance, and need for 4L of O2 to complete ADLs and functional mobility, and minimal need for increased assist to complete ADLs     Vision Baseline  Vision/History: 1 Wears glasses Ability to See in Adequate Light: 0 Adequate Patient Visual Report: No change from baseline       Perception     Praxis      Pertinent Vitals/Pain Pain Assessment Pain Assessment: No/denies pain     Hand Dominance Right   Extremity/Trunk Assessment Upper Extremity Assessment Upper Extremity Assessment: Generalized weakness   Lower Extremity Assessment Lower Extremity Assessment: Defer to PT evaluation;Generalized weakness   Cervical / Trunk Assessment Cervical / Trunk Assessment: Normal   Communication Communication Communication: No difficulties   Cognition Arousal/Alertness: Awake/alert Behavior During Therapy: WFL for tasks assessed/performed Overall Cognitive Status: Within Functional Limits for tasks assessed                                       General Comments       Exercises     Shoulder Instructions      Home Living Family/patient expects to be discharged to:: Private residence Living Arrangements: Other relatives (Sister) Available Help at Discharge: Available 24 hours/day;Available PRN/intermittently (Other family available to assist but still work) Type of Home: House Home Access: Stairs to enter [employer] of Steps: 3 Entrance Stairs-Rails: Right Home Layout: Two level;Full bath on main level (lives on first floor) Alternate Level Stairs-Number of Steps: stair lift to upstairs   Bathroom Shower/Tub: Walk-in shower   Bathroom Toilet: Handicapped height     Home Equipment: Conservation officer, nature (2 wheels);Cane - single point;Grab bars - tub/shower;Shower seat          Prior Functioning/Environment Prior Level of Function : Needs assist;History of Falls (last six months)             Mobility Comments: furniture surfs, one fall in the last six months ADLs Comments: independent        OT Problem List: Decreased strength;Decreased activity tolerance;Impaired balance (sitting  and/or standing);Decreased coordination;Decreased safety awareness;Decreased knowledge of use of DME or AE;Decreased knowledge of precautions;Cardiopulmonary status limiting activity;Increased edema      OT Treatment/Interventions: Self-care/ADL training;Therapeutic exercise;Energy conservation;DME and/or AE instruction;Manual therapy;Therapeutic activities;Patient/family education;Balance training    OT Goals(Current goals can be found in the care plan section) Acute Rehab OT Goals Patient Stated Goal: to get better OT Goal Formulation: With patient/family Time For Goal Achievement: 11/08/22 Potential to Achieve Goals: Good ADL Goals Pt Will Perform Lower Body Bathing: with modified independence;sitting/lateral leans;sit to/from stand Pt Will Perform Lower Body Dressing: with modified independence;sit to/from stand;sitting/lateral leans Pt Will Transfer to Toilet: with modified independence;ambulating;regular height toilet;grab bars Pt Will Perform Toileting - Clothing Manipulation and hygiene: with modified independence;sitting/lateral leans;sit to/from stand Pt/caregiver will Perform Home Exercise Program: Increased strength;With written HEP provided;With Supervision Additional ADL Goal #1: Patient will be able to complete functional task in standing for 3-5 minutes prior to needing seated rest break.  OT Frequency: Min 2X/week    Co-evaluation              AM-PAC OT "6 Clicks" Daily Activity     Outcome Measure Help from another person eating meals?: None Help from another person taking care of personal grooming?: None Help from another person toileting, which includes using toliet, bedpan, or urinal?: A Little Help from another  person bathing (including washing, rinsing, drying)?: A Little Help from another person to put on and taking off regular upper body clothing?: None Help from another person to put on and taking off regular lower body clothing?: A Little 6 Click Score:  21   End of Session Equipment Utilized During Treatment: Gait belt;Rolling walker (2 wheels);Oxygen Nurse Communication: Mobility status  Activity Tolerance: Patient tolerated treatment well Patient left: in bed;Other (comment);with family/visitor present (sitting EOB for PT eval)  OT Visit Diagnosis: Unsteadiness on feet (R26.81);Other abnormalities of gait and mobility (R26.89);History of falling (Z91.81);Muscle weakness (generalized) (M62.81)                Time: [id number] OT Time Calculation (min): 16 min Charges:  OT General Charges $OT Visit: 1 Visit OT Evaluation $OT Eval Moderate Complexity: 1 Mod  Corinne Ports E. Brittnae Aschenbrenner, OTR/L Acute Rehabilitation Services [phone]   Ascencion Dike 10/26/2022, 9:48 AM

## 2022-10-27 ENCOUNTER — Inpatient Hospital Stay (HOSPITAL_COMMUNITY): Payer: Medicare HMO

## 2022-10-27 DIAGNOSIS — I10 Essential (primary) hypertension: Secondary | ICD-10-CM | POA: Diagnosis not present

## 2022-10-27 DIAGNOSIS — J9601 Acute respiratory failure with hypoxia: Secondary | ICD-10-CM | POA: Diagnosis not present

## 2022-10-27 DIAGNOSIS — I509 Heart failure, unspecified: Secondary | ICD-10-CM

## 2022-10-27 DIAGNOSIS — I5021 Acute systolic (congestive) heart failure: Secondary | ICD-10-CM | POA: Diagnosis not present

## 2022-10-27 LAB — BASIC METABOLIC PANEL
Anion gap: 13 (ref 5–15)
BUN: 17 mg/dL (ref 8–23)
CO2: 25 mmol/L (ref 22–32)
Calcium: 8.9 mg/dL (ref 8.9–10.3)
Chloride: 102 mmol/L (ref 98–111)
Creatinine, Ser: 1.13 mg/dL — ABNORMAL HIGH (ref 0.44–1.00)
GFR, Estimated: 47 mL/min — ABNORMAL LOW (ref >60)
Glucose, Bld: 107 mg/dL — ABNORMAL HIGH (ref 70–99)
Potassium: 3.1 mmol/L — ABNORMAL LOW (ref 3.5–5.1)
Sodium: 140 mmol/L (ref 135–145)

## 2022-10-27 LAB — CBC
HCT: 23.8 % — ABNORMAL LOW (ref 36.0–46.0)
Hemoglobin: 7.2 g/dL — ABNORMAL LOW (ref 12.0–15.0)
MCH: 24.1 pg — ABNORMAL LOW (ref 26.0–34.0)
MCHC: 30.3 g/dL (ref 30.0–36.0)
MCV: 79.6 fL — ABNORMAL LOW (ref 80.0–100.0)
Platelets: 265 10*3/uL (ref 150–400)
RBC: 2.99 MIL/uL — ABNORMAL LOW (ref 3.87–5.11)
RDW: 15.1 % (ref 11.5–15.5)
WBC: 10.8 10*3/uL — ABNORMAL HIGH (ref 4.0–10.5)
nRBC: 0 % (ref 0.0–0.2)

## 2022-10-27 LAB — ECHOCARDIOGRAM LIMITED
Height: 65 in
Weight: 2249.6 oz

## 2022-10-27 LAB — MAGNESIUM: Magnesium: 1.7 mg/dL (ref 1.7–2.4)

## 2022-10-27 MED ORDER — SODIUM CHLORIDE 0.9 % IV BOLUS
250.0000 mL | Freq: Once | INTRAVENOUS | Status: AC
  Administered 2022-10-27: 250 mL via INTRAVENOUS

## 2022-10-27 MED ORDER — POTASSIUM CHLORIDE CRYS ER 20 MEQ PO TBCR
40.0000 meq | EXTENDED_RELEASE_TABLET | Freq: Once | ORAL | Status: AC
  Administered 2022-10-27: 40 meq via ORAL
  Filled 2022-10-27: qty 2

## 2022-10-27 NOTE — Progress Notes (Signed)
PT Cancellation Note  Patient Details Name: Mallory Stephens MRN: [id number] DOB: 12-02-1932   Cancelled Treatment:    Reason Eval/Treat Not Completed: Patient at procedure or test/unavailable. Checked on pt x2 this date. On first attempt pt asks that PT return later as she is fatigued. On second attempt ECHO in room. Will continue to follow and progress as able per POC.    [name] 10/27/2022, 2:17 PM  [name], PT, DPT Acute Rehabilitation Services Secure Chat Preferred Office: [phone]

## 2022-10-27 NOTE — Progress Notes (Signed)
Pt was feeling dizzy and nauseous after pt stand quickly, pt remained alert and oriented. BP was taken and was low. MD was informed and DC all BP medication and Lasix. MD assessed the pt at bedside and ordered bolus of 267ml of NS.  10/27/22 1118  Vitals  BP (!) 77/39  MAP (mmHg) (!) 51  BP Location Right Arm  BP Method Automatic  Patient Position (if appropriate) Lying  MEWS COLOR  MEWS Score Color Yellow  MEWS Score  MEWS Temp 0  MEWS Systolic 2  MEWS Pulse 0  MEWS RR 0  MEWS LOC 0  MEWS Score 2

## 2022-10-27 NOTE — Progress Notes (Signed)
[facility]  PROGRESS NOTE  Mallory Stephens [id number] DOB: 07-Aug-1932 DOA: 10/25/2022 PCP: Mallory Jordan, MD   Brief HPI:   89 year old female with history of permanent atrial fibrillation, hypertension, hyperlipidemia, breast cancer presented to ED with complaints of shortness of breath since 3/22 which got progressively worse.  She woke up around 5 AM and was short of breath.  No fever or chills.  In the ED chest x-ray showed asymmetric interstitial and airspace opacities left more than right.  Also was given DuoNeb nebulizers, antibiotics, blood cultures obtained.    Subjective   This morning patient blood pressure was low with SBP in 70s, she was given IV fluid bolus normal saline 250 cc x 1.   Assessment/Plan:    Acute hypoxemic respiratory failure -Patient was hypoxemic with O2 sat 7% on room air -BNP was elevated at 613, chest x-ray showed cardiomegaly with interstitial opacities -Findings consistent with CHF, procalcitonin is negative -Patient was given IV Lasix, breathing has improved -She is still requiring 2 L/min of oxygen -Echocardiogram showed EF 55 to 60%, no LVH  Hypotension -Blood pressure dropped this morning -Hold metoprolol, Cozaar, Lasix -Improved after patient received 250 cc normal saline bolus  Normocytic anemia -Iron studies showed normal ferritin, iron 16 -Continue p.o. iron  Hypokalemia -Stable  Permanent atrial fibrillation -Heart rate controlled -Continue apixaban -Lopressor on hold due to hypotension as above  Hypokalemia -Potassium is 3.1 -Replace potassium and follow BMP in am  Breast cancer: -07/24/2020: Left breast UIQ T1CN0 stage Ia grade 2 IDC ER/PR positive HER2 negative Ki-67 10% 09/06/2020: Left lumpectomy: T1CNX grade 2 IDC with negative margins, opted against radiation, 11/26/2020: Genetics: Negative. Cont her anastrozol   Medications     anastrozole  1 mg Oral Daily   apixaban  5 mg Oral BID   famotidine  40 mg  Oral QHS   PARoxetine  10 mg Oral Daily   pravastatin  20 mg Oral q1800   sodium chloride flush  3 mL Intravenous Q12H     Data Reviewed:   CBG:  No results for input(s): "GLUCAP" in the last 168 hours.  SpO2: 93 % O2 Flow Rate (L/min): 2 L/min    Vitals:   10/27/22 1118 10/27/22 1201 10/27/22 1339 10/27/22 1527  BP: (!) 77/39 (!) 119/51 (!) 107/58 (!) 118/46  Pulse:  70 70 75  Resp: 16  15 20   Temp: 98 F (36.7 C)   98.8 F (37.1 C)  TempSrc:    Oral  SpO2: 91% 96% 98% 93%  Weight:      Height:          Data Reviewed:  Basic Metabolic Panel: Recent Labs  Lab 10/25/22 0804 10/25/22 0818 10/25/22 1306 10/26/22 0326 10/27/22 0655  NA 140 141  141  --  138 140  K 3.3* 3.4*  3.4*  --  3.5 3.1*  CL 106  --   --  105 102  CO2 23  --   --  26 25  GLUCOSE 126*  --   --  126* 107*  BUN 21  --   --  17 17  CREATININE 1.08*  --   --  1.03* 1.13*  CALCIUM 9.5  --   --  9.0 8.9  MG  --   --  1.7  --  1.7    CBC: Recent Labs  Lab 10/25/22 0804 10/25/22 0818 10/26/22 0326 10/27/22 0655  WBC 15.9*  --  11.3* 10.8*  NEUTROABS 10.9*  --   --   --  HGB 8.8* 9.5*  9.5* 7.5* 7.2*  HCT 28.8* 28.0*  28.0* 26.0* 23.8*  MCV 81.1  --  83.3 79.6*  PLT 361  --  282 265    LFT Recent Labs  Lab 10/25/22 0804  AST 16  ALT 13  ALKPHOS 43  BILITOT 1.3*  PROT 6.3*  ALBUMIN 3.6     Antibiotics: Anti-infectives (From admission, onward)    Start     Dose/Rate Route Frequency Ordered Stop   10/26/22 1000  cefTRIAXone (ROCEPHIN) 2 g in sodium chloride 0.9 % 100 mL IVPB  Status:  Discontinued        2 g 200 mL/hr over 30 Minutes Intravenous Every 24 hours 10/25/22 1222 10/25/22 1236   10/26/22 1000  azithromycin (ZITHROMAX) 500 mg in sodium chloride 0.9 % 250 mL IVPB        500 mg 250 mL/hr over 60 Minutes Intravenous Every 24 hours 10/25/22 1222 10/30/22 0959   10/26/22 1000  cefTRIAXone (ROCEPHIN) 2 g in sodium chloride 0.9 % 100 mL IVPB        2 g 200 mL/hr  over 30 Minutes Intravenous Every 24 hours 10/25/22 1236 10/30/22 0959   10/25/22 0900  cefTRIAXone (ROCEPHIN) 2 g in sodium chloride 0.9 % 100 mL IVPB        2 g 200 mL/hr over 30 Minutes Intravenous  Once 10/25/22 0856 10/25/22 1011   10/25/22 0900  azithromycin (ZITHROMAX) 500 mg in sodium chloride 0.9 % 250 mL IVPB        500 mg 250 mL/hr over 60 Minutes Intravenous  Once 10/25/22 0856 10/25/22 1115        DVT prophylaxis: Apixaban  Code Status: DNR  Family Communication: Discussed  with patient's daughter at bedside   CONSULTS    Objective    Physical Examination:   General-appears in no acute distress Heart-S1-S2, regular, no murmur auscultated Lungs-clear to auscultation bilaterally, no wheezing or crackles auscultated Abdomen-soft, nontender, no organomegaly Extremities-no edema in the lower extremities Neuro-alert, oriented x3, no focal deficit noted  Status is: Inpatient:             [name]   [facility] If 7PM-7AM, please contact night-coverage at [email or web address], Office  [phone]   10/27/2022, 6:01 PM  LOS: 2 days

## 2022-10-27 NOTE — Progress Notes (Signed)
Occupational Therapy Treatment Patient Details Name: Mallory Stephens MRN: [id number] DOB: 05-09-1933 Today's Date: 10/27/2022   History of present illness Patient is a 89 yo female presenting to the ED with SOB on 10/25/22. PMH includes:atrial fibrillation, HTN, HLD, breast cancer   OT comments  Pt progressing towards goals, needing min guard A for toileting and standing grooming task during session. Pt able to complete short ambulation distance with RW on RA with SpO2 in mid-low 90s when checked intermittently. Pt placed on 1L O2 and SpO2 reading 93%, left on 1L at end of session with RN aware. Pt presenting with impairments listed below, will follow acutely. Pt to benefit from continued therapy services in the home setting.            Recommendations for follow up therapy are one component of a multi-disciplinary discharge planning process, led by the attending physician.  Recommendations may be updated based on patient status, additional functional criteria and insurance authorization.    Assistance Recommended at Discharge Intermittent Supervision/Assistance  Patient can return home with the following  A little help with walking and/or transfers;A little help with bathing/dressing/bathroom;Assistance with cooking/housework;Assist for transportation;Help with stairs or ramp for entrance   Equipment Recommendations  BSC/3in1    Recommendations for Other Services PT consult    Precautions / Restrictions Precautions Precautions: Fall Precaution Comments: watch O2 Restrictions Weight Bearing Restrictions: No       Mobility Bed Mobility               General bed mobility comments: OOB in chair upon arrival and departure    Transfers Overall transfer level: Needs assistance Equipment used: Rolling walker (2 wheels) Transfers: Sit to/from Stand Sit to Stand: Min guard                 Balance Overall balance assessment: Mild deficits observed, not formally tested                                          ADL either performed or assessed with clinical judgement   ADL Overall ADL's : Needs assistance/impaired     Grooming: Standing;Min guard;Wash/dry Nurse, mental health Details (indicate cue type and reason): washes hands standing at sink                 Toilet Transfer: Min guard;Ambulation;Rolling walker (2 wheels);Comfort height toilet   Toileting- Clothing Manipulation and Hygiene: Min guard;Sit to/from stand;Sitting/lateral lean       Functional mobility during ADLs: Min guard;Cueing for sequencing;Cueing for safety;Rolling walker (2 wheels)      Extremity/Trunk Assessment Upper Extremity Assessment Upper Extremity Assessment: Generalized weakness   Lower Extremity Assessment Lower Extremity Assessment: Defer to PT evaluation        Vision   Vision Assessment?: No apparent visual deficits   Perception Perception Perception: Not tested   Praxis Praxis Praxis: Not tested    Cognition Arousal/Alertness: Awake/alert Behavior During Therapy: WFL for tasks assessed/performed Overall Cognitive Status: Within Functional Limits for tasks assessed                                          Exercises      Shoulder Instructions       General Comments VSS on RA-1L O2    Pertinent  Vitals/ Pain       Pain Assessment Pain Assessment: No/denies pain  Home Living                                          Prior Functioning/Environment              Frequency  Min 2X/week        Progress Toward Goals  OT Goals(current goals can now be found in the care plan section)  Progress towards OT goals: Progressing toward goals  Acute Rehab OT Goals Patient Stated Goal: to go home OT Goal Formulation: With patient/family Time For Goal Achievement: 11/08/22 Potential to Achieve Goals: Good ADL Goals Pt Will Perform Lower Body Bathing: with modified  independence;sitting/lateral leans;sit to/from stand Pt Will Perform Lower Body Dressing: with modified independence;sit to/from stand;sitting/lateral leans Pt Will Transfer to Toilet: with modified independence;ambulating;regular height toilet;grab bars Pt Will Perform Toileting - Clothing Manipulation and hygiene: with modified independence;sitting/lateral leans;sit to/from stand Pt/caregiver will Perform Home Exercise Program: Increased strength;With written HEP provided;With Supervision Additional ADL Goal #1: Patient will be able to complete functional task in standing for 3-5 minutes prior to needing seated rest break.  Plan Discharge plan remains appropriate;Frequency remains appropriate    Co-evaluation                 AM-PAC OT "6 Clicks" Daily Activity     Outcome Measure   Help from another person eating meals?: None Help from another person taking care of personal grooming?: None Help from another person toileting, which includes using toliet, bedpan, or urinal?: A Little Help from another person bathing (including washing, rinsing, drying)?: A Little Help from another person to put on and taking off regular upper body clothing?: None Help from another person to put on and taking off regular lower body clothing?: A Little 6 Click Score: 21    End of Session Equipment Utilized During Treatment: Gait belt;Rolling walker (2 wheels);Oxygen (RA-1L)  OT Visit Diagnosis: Unsteadiness on feet (R26.81);Other abnormalities of gait and mobility (R26.89);History of falling (Z91.81);Muscle weakness (generalized) (M62.81)   Activity Tolerance Patient tolerated treatment well   Patient Left in chair;with call bell/phone within reach;with family/visitor present   Nurse Communication Mobility status        Time: 0802-0822 OT Time Calculation (min): 20 min  Charges: OT General Charges $OT Visit: 1 Visit OT Treatments $Self Care/Home Management : 8-22 mins  Renaye Rakers, OTD,  OTR/L SecureChat Preferred Acute Rehab [phone]   Renaye Rakers Koonce 10/27/2022, 8:34 AM

## 2022-10-27 NOTE — Progress Notes (Signed)
   10/27/22 1000  Spiritual Encounters  Type of Visit Initial  Care provided to: Patient  Referral source Nurse (RN/NT/LPN)  Reason for visit Advance directives  OnCall Visit No   Ch responded to request for AD. Family was present at bedside. Pt declined AD. She said that she did not request for AD. She has a completed AD at home. No follow-up needed at this time.

## 2022-10-28 DIAGNOSIS — J9601 Acute respiratory failure with hypoxia: Secondary | ICD-10-CM | POA: Diagnosis not present

## 2022-10-28 DIAGNOSIS — D649 Anemia, unspecified: Secondary | ICD-10-CM | POA: Diagnosis not present

## 2022-10-28 DIAGNOSIS — I4821 Permanent atrial fibrillation: Secondary | ICD-10-CM | POA: Diagnosis not present

## 2022-10-28 LAB — BASIC METABOLIC PANEL
Anion gap: 8 (ref 5–15)
BUN: 19 mg/dL (ref 8–23)
CO2: 28 mmol/L (ref 22–32)
Calcium: 8.6 mg/dL — ABNORMAL LOW (ref 8.9–10.3)
Chloride: 104 mmol/L (ref 98–111)
Creatinine, Ser: 1.22 mg/dL — ABNORMAL HIGH (ref 0.44–1.00)
GFR, Estimated: 43 mL/min — ABNORMAL LOW (ref >60)
Glucose, Bld: 91 mg/dL (ref 70–99)
Potassium: 3.8 mmol/L (ref 3.5–5.1)
Sodium: 140 mmol/L (ref 135–145)

## 2022-10-28 LAB — CBC
HCT: 23.4 % — ABNORMAL LOW (ref 36.0–46.0)
Hemoglobin: 7 g/dL — ABNORMAL LOW (ref 12.0–15.0)
MCH: 24.4 pg — ABNORMAL LOW (ref 26.0–34.0)
MCHC: 29.9 g/dL — ABNORMAL LOW (ref 30.0–36.0)
MCV: 81.5 fL (ref 80.0–100.0)
Platelets: 255 10*3/uL (ref 150–400)
RBC: 2.87 MIL/uL — ABNORMAL LOW (ref 3.87–5.11)
RDW: 15.1 % (ref 11.5–15.5)
WBC: 10.6 10*3/uL — ABNORMAL HIGH (ref 4.0–10.5)
nRBC: 0 % (ref 0.0–0.2)

## 2022-10-28 LAB — PREPARE RBC (CROSSMATCH)

## 2022-10-28 LAB — ABO/RH: ABO/RH(D): A NEG

## 2022-10-28 LAB — LEGIONELLA PNEUMOPHILA SEROGP 1 UR AG: L. pneumophila Serogp 1 Ur Ag: NEGATIVE

## 2022-10-28 MED ORDER — FUROSEMIDE 20 MG PO TABS
20.0000 mg | ORAL_TABLET | Freq: Every day | ORAL | Status: DC
  Administered 2022-10-28: 20 mg via ORAL
  Filled 2022-10-28: qty 1

## 2022-10-28 MED ORDER — FERROUS GLUCONATE 324 (38 FE) MG PO TABS
324.0000 mg | ORAL_TABLET | Freq: Three times a day (TID) | ORAL | Status: DC
  Administered 2022-10-28 – 2022-10-29 (×3): 324 mg via ORAL
  Filled 2022-10-28 (×5): qty 1

## 2022-10-28 MED ORDER — FUROSEMIDE 20 MG PO TABS
20.0000 mg | ORAL_TABLET | Freq: Every day | ORAL | Status: DC
  Administered 2022-10-28 – 2022-10-29 (×2): 20 mg via ORAL
  Filled 2022-10-28 (×2): qty 1

## 2022-10-28 MED ORDER — SODIUM CHLORIDE 0.9% IV SOLUTION: Freq: Once | INTRAVENOUS | Status: DC

## 2022-10-28 MED ORDER — AZITHROMYCIN 500 MG PO TABS
500.0000 mg | ORAL_TABLET | Freq: Every day | ORAL | Status: DC
  Administered 2022-10-28: 500 mg via ORAL
  Filled 2022-10-28: qty 1

## 2022-10-28 NOTE — Progress Notes (Signed)
Physical Therapy Treatment Patient Details Name: Mallory Stephens MRN: [id number] DOB: [birth year] Today's Date: 10/28/2022   History of Present Illness Patient is a 89 yo female presenting to the ED with SOB on 10/25/22. PMH includes:atrial fibrillation, HTN, HLD, breast cancer    PT Comments    Pt greeted up in chair on arrival and agreeable to session with continued progress towards acute goals. Pt requiring grossly min guard assist with 4 wheeled RW support throughout OOB mobility with pt encouraged to self-monitor symptoms throughout. Pt requiring x1 standing rest break due to fatigue and elevated HR up to 125bpm non sustained, and cues throughout for rollator proximity, technique and safety. Pt was educated on continued 4 wheeled walker use to maximize functional independence, safety, and decrease risk for falls as well as correct IS use and frequency with pt verbalizing understanding. Pt VSS on RA - 1L supplemental O2. Pt continues to benefit from skilled PT services to progress toward functional mobility goals.    Recommendations for follow up therapy are one component of a multi-disciplinary discharge planning process, led by the attending physician.  Recommendations may be updated based on patient status, additional functional criteria and insurance authorization.  Follow Up Recommendations       Assistance Recommended at Discharge Intermittent Supervision/Assistance  Patient can return home with the following A little help with walking and/or transfers;A little help with bathing/dressing/bathroom   Equipment Recommendations  Other (comment) (May need supplemental O2; will need to recheck O2 qualifyiing walk over next few sessions)    Recommendations for Other Services Other (comment) (Mobility)     Precautions / Restrictions Precautions Precautions: Fall Precaution Comments: watch O2 Restrictions Weight Bearing Restrictions: No Other Position/Activity Restrictions: Wearing  2L O2 upon arrival; does not usually wear oxygen     Mobility  Bed Mobility Overal bed mobility: Needs Assistance             General bed mobility comments: OOB in chair upon arrival and departure    Transfers Overall transfer level: Needs assistance Equipment used: Rollator (4 wheels) Transfers: Sit to/from Stand Sit to Stand: Min guard           General transfer comment: min gaurd for sit<>stand, cues for locking rollator brakes before coming to stand    Ambulation/Gait Ambulation/Gait assistance: Min guard Gait Distance (Feet): 300 Feet Assistive device: Rollator (4 wheels) Gait Pattern/deviations: Step-through pattern Gait velocity: decr     General Gait Details: Cues to self-monitor for activity tolerance; one standing break during walk, HR up to 125bpm non sustained   Stairs Stairs: Yes Stairs assistance: Min guard Stair Management: Two rails, Alternating pattern, Forwards Number of Stairs: 3 General stair comments: up/down steps in stairwell without fault   Wheelchair Mobility    Modified Rankin (Stroke Patients Only)       Balance Overall balance assessment: Mild deficits observed, not formally tested                                          Cognition Arousal/Alertness: Awake/alert Behavior During Therapy: WFL for tasks assessed/performed Overall Cognitive Status: Within Functional Limits for tasks assessed                                          Exercises  General Comments General comments (skin integrity, edema, etc.): VSS on RA-1L O2      Pertinent Vitals/Pain Pain Assessment Pain Assessment: No/denies pain    Home Living                          Prior Function            PT Goals (current goals can now be found in the care plan section) Acute Rehab PT Goals PT Goal Formulation: With patient Time For Goal Achievement: 11/03/22 Progress towards PT goals: Progressing  toward goals    Frequency    Min 3X/week      PT Plan      Co-evaluation              AM-PAC PT "6 Clicks" Mobility   Outcome Measure  Help needed turning from your back to your side while in a flat bed without using bedrails?: A Little Help needed moving from lying on your back to sitting on the side of a flat bed without using bedrails?: A Little Help needed moving to and from a bed to a chair (including a wheelchair)?: A Little Help needed standing up from a chair using your arms (e.g., wheelchair or bedside chair)?: A Little Help needed to walk in hospital room?: A Little Help needed climbing 3-5 steps with a railing? : A Little 6 Click Score: 18    End of Session Equipment Utilized During Treatment: Oxygen Activity Tolerance: Patient tolerated treatment well Patient left: in chair;with call bell/phone within reach;with chair alarm set;with family/visitor present;with nursing/sitter in room Nurse Communication: Mobility status PT Visit Diagnosis: Unsteadiness on feet (R26.81) (decr functional capacity)     Time: [id number] PT Time Calculation (min) (ACUTE ONLY): 26 min  Charges:  $Gait Training: 23-37 mins                    Audry Riles. PTA Acute Rehabilitation Services Office: [place] 10/28/2022, 9:42 AM

## 2022-10-28 NOTE — Progress Notes (Addendum)
[facility]  PROGRESS NOTE  KETRA SEGARRA [id number] DOB: 01/27/1933 DOA: 10/25/2022 PCP: [name], MD   Brief HPI:   89 year old female with history of permanent atrial fibrillation, hypertension, hyperlipidemia, breast cancer presented to ED with complaints of shortness of breath since 3/22 which got progressively worse.  She woke up around 5 AM and was short of breath.  No fever or chills.  In the ED chest x-ray showed asymmetric interstitial and airspace opacities left more than right.  Also was given DuoNeb nebulizers, antibiotics, blood cultures obtained.    Subjective   Patient seen and examined, breathing has improved.  Hemoglobin is down to 7.0.   Assessment/Plan:    Acute hypoxemic respiratory failure -Significantly improved -Patient was hypoxemic on presentation -Echocardiogram shows pulmonary hypertension -BNP was elevated at 613, chest x-ray showed cardiomegaly with interstitial opacities -Findings consistent with CHF, procalcitonin is negative -Patient was given IV Lasix, breathing has improved -She is still requiring 2 L/min of oxygen -Echocardiogram showed EF 55 to 60%, no LVH  Hypotension -Blood pressure dropped this morning -Hold metoprolol, Cozaar, Lasix -Improved after patient received 250 cc normal saline bolus  Normocytic anemia -Hemoglobin is down to 7.0 this morning -Iron studies showed normal ferritin, iron 16 -Iron saturation of 5% Discussed with Dr. Benay Spice, will give 1 unit of PRBC.  Will discharge patient home on stool guaiac cards, she will follow-up with her PCP to get the results for FOBT.  She will be discharged home on ferrous sulfate tablets.  Patient is currently on Eliquis.  If FOBT is positive, she will follow-up with cardiology to discuss the need to continue taking Eliquis  Hypokalemia -Stable  Permanent atrial fibrillation -Heart rate controlled -Continue apixaban -Lopressor on hold due to hypotension as  above  Hypokalemia -Replete  Breast cancer: -07/24/2020: Left breast UIQ T1CN0 stage Ia grade 2 IDC ER/PR positive HER2 negative Ki-67 10% 09/06/2020: Left lumpectomy: T1CNX grade 2 IDC with negative margins, opted against radiation, 11/26/2020: Genetics: Negative. Cont her anastrozol   Medications     sodium chloride   Intravenous Once   anastrozole  1 mg Oral Daily   apixaban  5 mg Oral BID   famotidine  40 mg Oral QHS   ferrous gluconate  324 mg Oral TID WC   furosemide  20 mg Oral Daily   PARoxetine  10 mg Oral Daily   pravastatin  20 mg Oral q1800   sodium chloride flush  3 mL Intravenous Q12H     Data Reviewed:   CBG:  No results for input(s): "GLUCAP" in the last 168 hours.  SpO2: 96 % O2 Flow Rate (L/min): 1.5 L/min    Vitals:   10/28/22 0615 10/28/22 0937 10/28/22 1342 10/28/22 1409  BP: 138/76 (!) 136/59 (!) 159/59 (!) 153/113  Pulse: 72 79 98 86  Resp: 18 18 (!) 22 20  Temp: 97.9 F (36.6 C) 98 F (36.7 C) 98.4 F (36.9 C) 98.2 F (36.8 C)  TempSrc: Oral Oral Oral Oral  SpO2: 99% 100% 96% 96%  Weight:      Height:          Data Reviewed:  Basic Metabolic Panel: Recent Labs  Lab 10/25/22 0804 10/25/22 0818 10/25/22 1306 10/26/22 0326 10/27/22 0655 10/28/22 0354  NA 140 141  141  --  138 140 140  K 3.3* 3.4*  3.4*  --  3.5 3.1* 3.8  CL 106  --   --  105 102 104  CO2 23  --   --  26 25 28   GLUCOSE 126*  --   --  126* 107* 91  BUN 21  --   --  17 17 19   CREATININE 1.08*  --   --  1.03* 1.13* 1.22*  CALCIUM 9.5  --   --  9.0 8.9 8.6*  MG  --   --  1.7  --  1.7  --     CBC: Recent Labs  Lab 10/25/22 0804 10/25/22 0818 10/26/22 0326 10/27/22 0655 10/28/22 0354  WBC 15.9*  --  11.3* 10.8* 10.6*  NEUTROABS 10.9*  --   --   --   --   HGB 8.8* 9.5*  9.5* 7.5* 7.2* 7.0*  HCT 28.8* 28.0*  28.0* 26.0* 23.8* 23.4*  MCV 81.1  --  83.3 79.6* 81.5  PLT 361  --  282 265 255    LFT Recent Labs  Lab 10/25/22 0804  AST 16  ALT 13   ALKPHOS 43  BILITOT 1.3*  PROT 6.3*  ALBUMIN 3.6     Antibiotics: Anti-infectives (From admission, onward)    Start     Dose/Rate Route Frequency Ordered Stop   10/28/22 1000  azithromycin (ZITHROMAX) tablet 500 mg  Status:  Discontinued        500 mg Oral Daily 10/28/22 0854 10/28/22 1045   10/26/22 1000  cefTRIAXone (ROCEPHIN) 2 g in sodium chloride 0.9 % 100 mL IVPB  Status:  Discontinued        2 g 200 mL/hr over 30 Minutes Intravenous Every 24 hours 10/25/22 1222 10/25/22 1236   10/26/22 1000  azithromycin (ZITHROMAX) 500 mg in sodium chloride 0.9 % 250 mL IVPB  Status:  Discontinued        500 mg 250 mL/hr over 60 Minutes Intravenous Every 24 hours 10/25/22 1222 10/28/22 0854   10/26/22 1000  cefTRIAXone (ROCEPHIN) 2 g in sodium chloride 0.9 % 100 mL IVPB  Status:  Discontinued        2 g 200 mL/hr over 30 Minutes Intravenous Every 24 hours 10/25/22 1236 10/28/22 1045   10/25/22 0900  cefTRIAXone (ROCEPHIN) 2 g in sodium chloride 0.9 % 100 mL IVPB        2 g 200 mL/hr over 30 Minutes Intravenous  Once 10/25/22 0856 10/25/22 1011   10/25/22 0900  azithromycin (ZITHROMAX) 500 mg in sodium chloride 0.9 % 250 mL IVPB        500 mg 250 mL/hr over 60 Minutes Intravenous  Once 10/25/22 0856 10/25/22 1115        DVT prophylaxis: Apixaban  Code Status: DNR  Family Communication: Discussed  with patient's daughter at bedside   CONSULTS    Objective    Physical Examination:  Appears in no acute distress S1-S2, regular, no murmur auscultated Clear to auscultation bilaterally Abdomen is soft, nontender, no organomegaly   Status is: Inpatient:             [name]   [facility] If 7PM-7AM, please contact night-coverage at [email or web address], Office  [phone]   10/28/2022, 2:31 PM  LOS: 3 days

## 2022-10-28 NOTE — Progress Notes (Signed)
PHARMACIST - PHYSICIAN COMMUNICATION DR:   Darrick Meigs CONCERNING: Antibiotic IV to Oral Route Change Policy  RECOMMENDATION: This patient is receiving azithromycin by the intravenous route.  Based on criteria approved by the Pharmacy and Therapeutics Committee, the antibiotic(s) is/are being converted to the equivalent oral dose form(s).   DESCRIPTION: These criteria include: Patient being treated for a respiratory tract infection, urinary tract infection, cellulitis or clostridium difficile associated diarrhea if on metronidazole The patient is not neutropenic and does not exhibit a GI malabsorption state The patient is eating (either orally or via tube) and/or has been taking other orally administered medications for a least 24 hours The patient is improving clinically and has a Tmax < 100.5  If you have questions about this conversion, please contact the Pharmacy Department  []   [phone] )  [facility] Na [x]   [phone] )  Zacarias Pontes  []   [phone] )  [facility] []   [phone] )  [place], PharmD, BCPS 10/28/2022 8:55 AM

## 2022-10-28 NOTE — TOC Initial Note (Signed)
Transition of Care [facility]) - Initial/Assessment Note    Patient Details  Name: Mallory Stephens MRN: [id number] Date of Birth: 01-15-1933  Transition of [facility]) CM/SW Contact:    Tom-Johnson, Renea Ee, RN Phone Number: 10/28/2022, 4:43 PM  Clinical Narrative:                  CM spoke with patient at bedside about needs for post hospital transition.  Admitted for ARF with Hypoxia/CHF Exacerbation. On room air.  From home with sister, does not have children. Independent with care prior to admission. Has a cane, walker, rollator and shower seat at home. BSC recommended by OT, order called in to [employer] and Erasmo Downer to deliver to patient at bedside.  PCP is [name], MD and uses [facility] on [address].  Home Health referral called in to [facility] per patient's request.Cheryl voiced acceptance and info on AVS. Niece to transport at discharge. CM will continue to follow as patient progresses with care towards discharge.           Expected Discharge Plan: [place] Barriers to Discharge: Continued Medical Work up   Patient Goals and CMS Choice Patient states their goals for this hospitalization and ongoing recovery are:: To return home CMS Medicare.gov Compare Post Acute Care list provided to:: Patient Choice offered to / list presented to : Patient      Expected Discharge Plan and Services   Discharge Planning Services: CM Consult Post Acute Care Choice: [place] arrangements for the past 2 months: Single Family Home                 DME Arranged: Bedside commode DME Agency: [facility] Date DME Agency Contacted: 10/28/22 Time DME Agency Contacted: 61 Representative spoke with at DME Agency: Erasmo Downer HH Arranged: PT, OT   Date [place]: 10/28/22 Time [place]: 1635    Prior Living Arrangements/Services Living arrangements for the past 2 months: [place] with:: Siblings  (Sister) Patient language and need for interpreter reviewed:: Yes Do you feel safe going back to the place where you live?: Yes      Need for Family Participation in Patient Care: Yes (Comment) Care giver support system in place?: Yes (comment) Current home services: DME (Cane, walker, shower seat, rollator) Criminal Activity/Legal Involvement Pertinent to Current Situation/Hospitalization: No - Comment as needed  Activities of Daily Living Home Assistive Devices/Equipment: Bedside commode/3-in-1, Eyeglasses, Shower chair without back, Grab bars in shower, Grab bars around toilet ADL Screening (condition at time of admission) Patient's cognitive ability adequate to safely complete daily activities?: Yes Is the patient deaf or have difficulty hearing?: No Does the patient have difficulty seeing, even when wearing glasses/contacts?: No Does the patient have difficulty concentrating, remembering, or making decisions?: No Patient able to express need for assistance with ADLs?: Yes Does the patient have difficulty dressing or bathing?: No Independently performs ADLs?: Yes (appropriate for developmental age) Does the patient have difficulty walking or climbing stairs?: No Weakness of Legs: Both Weakness of Arms/Hands: None  Permission Sought/Granted Permission sought to share information with : Case Manager, Customer service manager, Family Supports Permission granted to share information with : Yes, Verbal Permission Granted              Emotional Assessment Appearance:: Appears stated age Attitude/Demeanor/Rapport: Engaged, Gracious Affect (typically observed): Accepting, Appropriate, Calm, Hopeful, Pleasant Orientation: : Oriented to Self, Oriented to Place, Oriented to  Time, Oriented to Situation  Alcohol / Substance Use: Not Applicable Psych Involvement: No (comment)  Admission diagnosis:  Acute respiratory failure with hypoxia ([place]) [J96.01] Community acquired pneumonia,  unspecified laterality [J18.9] Acute hypoxic respiratory failure ([place]) [J96.01] Patient Active Problem List   Diagnosis Date Noted   Breast cancer ([place]) 10/25/2022   Permanent atrial fibrillation ([place]) 10/25/2022   Essential hypertension 10/25/2022   Hyperlipidemia 10/25/2022   Hypokalemia 10/25/2022   Normocytic anemia 10/25/2022   Acute respiratory failure with hypoxia secondary to CAP vs/+ new CHF exacerbation 10/25/2022   CHF exacerbation ([place]) 10/25/2022   CAP (community acquired pneumonia) 10/25/2022   PCP:  [name], MD Pharmacy:  No Pharmacies Listed    Social Determinants of Health (SDOH) Social History: SDOH Screenings   Food Insecurity: No Food Insecurity (10/25/2022)  Housing: Low Risk  (10/25/2022)  Transportation Needs: No Transportation Needs (10/25/2022)  Utilities: Not At Risk (10/25/2022)  Tobacco Use: Low Risk  (10/25/2022)   SDOH Interventions: Transportation Interventions: Intervention Not Indicated, Inpatient TOC, Patient Resources (Friends/Family)   Readmission Risk Interventions    10/28/2022    4:35 PM  Readmission Risk Prevention Plan  Transportation Screening Complete  PCP or Specialist Appt within 5-7 Days Complete  Home Care Screening Complete  Medication Review (RN CM) Referral to Pharmacy

## 2022-10-28 NOTE — Care Management Important Message (Signed)
Important Message  Patient Details  Name: Mallory Stephens MRN: [id number] Date of Birth: 1933/02/01   Medicare Important Message Given:  Yes     Orbie Pyo 10/28/2022, 2:23 PM

## 2022-10-29 ENCOUNTER — Encounter: Payer: Self-pay | Admitting: Cardiology

## 2022-10-29 DIAGNOSIS — D649 Anemia, unspecified: Secondary | ICD-10-CM | POA: Diagnosis not present

## 2022-10-29 DIAGNOSIS — J9601 Acute respiratory failure with hypoxia: Secondary | ICD-10-CM | POA: Diagnosis not present

## 2022-10-29 DIAGNOSIS — E876 Hypokalemia: Secondary | ICD-10-CM

## 2022-10-29 DIAGNOSIS — J189 Pneumonia, unspecified organism: Secondary | ICD-10-CM | POA: Diagnosis not present

## 2022-10-29 LAB — TYPE AND SCREEN
ABO/RH(D): A NEG
Antibody Screen: NEGATIVE
Unit division: 0

## 2022-10-29 LAB — CBC
HCT: 27.7 % — ABNORMAL LOW (ref 36.0–46.0)
Hemoglobin: 8.3 g/dL — ABNORMAL LOW (ref 12.0–15.0)
MCH: 24.8 pg — ABNORMAL LOW (ref 26.0–34.0)
MCHC: 30 g/dL (ref 30.0–36.0)
MCV: 82.7 fL (ref 80.0–100.0)
Platelets: 289 K/uL (ref 150–400)
RBC: 3.35 MIL/uL — ABNORMAL LOW (ref 3.87–5.11)
RDW: 15.2 % (ref 11.5–15.5)
WBC: 11 K/uL — ABNORMAL HIGH (ref 4.0–10.5)
nRBC: 0 % (ref 0.0–0.2)

## 2022-10-29 LAB — BASIC METABOLIC PANEL
Anion gap: 8 (ref 5–15)
BUN: 15 mg/dL (ref 8–23)
CO2: 29 mmol/L (ref 22–32)
Calcium: 9 mg/dL (ref 8.9–10.3)
Chloride: 103 mmol/L (ref 98–111)
Creatinine, Ser: 1.02 mg/dL — ABNORMAL HIGH (ref 0.44–1.00)
GFR, Estimated: 53 mL/min — ABNORMAL LOW (ref >60)
Glucose, Bld: 106 mg/dL — ABNORMAL HIGH (ref 70–99)
Potassium: 4 mmol/L (ref 3.5–5.1)
Sodium: 140 mmol/L (ref 135–145)

## 2022-10-29 LAB — BPAM RBC
Blood Product Expiration Date: 202404222359
ISSUE DATE / TIME: 202403261345
Unit Type and Rh: 600

## 2022-10-29 MED ORDER — FUROSEMIDE 20 MG PO TABS: 20.0000 mg | ORAL_TABLET | ORAL | 2 refills | Status: DC

## 2022-10-29 MED ORDER — FERROUS GLUCONATE 324 (38 FE) MG PO TABS: 324.0000 mg | ORAL_TABLET | Freq: Two times a day (BID) | ORAL | 3 refills | Status: DC

## 2022-10-29 MED ORDER — LOSARTAN POTASSIUM 25 MG PO TABS: 25.0000 mg | ORAL_TABLET | Freq: Every day | ORAL | 11 refills | Status: DC

## 2022-10-29 NOTE — Discharge Summary (Signed)
Physician Discharge Summary   Patient: Mallory Stephens MRN: [id number] DOB: [birth year]  Admit date:     10/25/2022  Discharge date: 10/29/22  Discharge Physician: [name]   PCP: [name], MD   Recommendations at discharge:   Follow-up PCP in 1 week to check stool for occult blood Check CBC in 1 week  Discharge Diagnoses: Principal Problem:   Acute respiratory failure with hypoxia secondary to CAP vs/+ new CHF exacerbation Active Problems:   CAP (community acquired pneumonia)   Normocytic anemia   Hypokalemia   Permanent atrial fibrillation (HCC)   Essential hypertension   Hyperlipidemia   Breast cancer (HCC)   CHF exacerbation ([place])  Resolved Problems:   * No resolved hospital problems. *  Hospital Course:  89 year old female with history of permanent atrial fibrillation, hypertension, hyperlipidemia, breast cancer presented to ED with complaints of shortness of breath since 3/22 which got progressively worse.  She woke up around 5 AM and was short of breath.  No fever or chills.  In the ED chest x-ray showed asymmetric interstitial and airspace opacities left more than right.  Also was given DuoNeb nebulizers, antibiotics, blood cultures obtained.    Assessment and Plan:    Acute hypoxemic respiratory failure/CHF exacerbation -Significantly improved with diuresis; no longer requiring oxygen -Patient was hypoxemic on presentation -Echocardiogram shows pulmonary hypertension -BNP was elevated at 613, chest x-ray showed cardiomegaly with interstitial opacities -Findings consistent with CHF, procalcitonin is negative -Blood culture and respiratory panel by PCR negative -Patient was given IV Lasix, breathing has improved -Oxygen has been weaned off to room air -Started on Lasix 20 mg daily -Echocardiogram showed EF 55 to 60%, no LVH -Will discharge home on Lasix 20 mg every other day   Hypotension -Blood pressure dropped in the hospital -Resolved after  stopping metoprolol, Lasix, Cozaar  Hypertension -Blood pressure has been mildly elevated in the hospital after stopping above medications -Will restart metoprolol 25 mg p.o. twice daily -Discontinue amlodipine and cut down the dose of Cozaar to 25 mg daily   Iron deficiency anemia -S/p 1 unit PRBC, hemoglobin is up to 8.3 this morning -Iron studies showed normal ferritin, iron 16 -Iron saturation of 5% Discussed with Dr. Benay Spice, will give 1 unit of PRBC.  Will discharge patient home on stool guaiac cards, she will follow-up with her PCP to get the results for FOBT.  She will be discharged home on ferrous sulfate tablets.  Patient is currently on Eliquis.  If FOBT is positive, she will follow-up with cardiology to discuss the need to continue taking Eliquis   Hypokalemia -Stable   Permanent atrial fibrillation -Heart rate controlled -Continue apixaban    Hypokalemia -Replete   Breast cancer: -07/24/2020: Left breast UIQ T1CN0 stage Ia grade 2 IDC ER/PR positive HER2 negative Ki-67 10% 09/06/2020: Left lumpectomy: T1CNX grade 2 IDC with negative margins, opted against radiation, 11/26/2020: Genetics: Negative. Cont her anastrozol         Consultants:  Procedures performed:  Disposition: Home Diet recommendation:  Discharge Diet Orders (From admission, onward)     Start     Ordered   10/29/22 0000  Diet - low sodium heart healthy        10/29/22 1129           Cardiac diet DISCHARGE MEDICATION: Allergies as of 10/29/2022       Reactions   Sulfa Antibiotics         Medication List     STOP  taking these medications    amLODipine 5 MG tablet Commonly known as: NORVASC       TAKE these medications    anastrozole 1 MG tablet Commonly known as: ARIMIDEX Take 1 mg by mouth daily.   Eliquis 5 MG Tabs tablet Generic drug: apixaban Take 5 mg by mouth 2 (two) times daily.   famotidine 40 MG tablet Commonly known as: PEPCID Take 40 mg by mouth daily.    ferrous gluconate 324 MG tablet Commonly known as: FERGON Take 1 tablet (324 mg total) by mouth 2 (two) times daily with a meal.   furosemide 20 MG tablet Commonly known as: LASIX Take 1 tablet (20 mg total) by mouth every other day. What changed:  when to take this reasons to take this   ibandronate 150 MG tablet Commonly known as: BONIVA Take 150 mg by mouth every 30 (thirty) days.   losartan 25 MG tablet Commonly known as: Cozaar Take 1 tablet (25 mg total) by mouth daily. What changed:  medication strength how much to take   metoprolol tartrate 25 MG tablet Commonly known as: LOPRESSOR Take 25 mg by mouth 2 (two) times daily.   PARoxetine 10 MG tablet Commonly known as: PAXIL Take 10 mg by mouth daily.   pravastatin 20 MG tablet Commonly known as: PRAVACHOL Take 20 mg by mouth daily.   solifenacin 5 MG tablet Commonly known as: VESICARE Take 5 mg by mouth daily.        Follow-up Information     [facility] Follow up.   Why: Someone will call you to schedule first home visit. If you have not received a call after two days of discharging home, call their number listed. If no one comes to assess, call Case Manager at [phone]. Contact information: [place], [place] [address]   [place], [place] [address]  [phone]        [name], MD Follow up in 1 week(s).   Specialty: Family Medicine Why: Check CBC in 1 week.  Follow-up on the occult blood cards Contact information: [place] [address] [phone]                Discharge Exam: Danley Danker Weights   10/26/22 0716 10/26/22 1434 10/29/22 0500  Weight: 63.7 kg 63.8 kg 63 kg   General-appears in no acute distress Heart-S1-S2, regular, no murmur auscultated Lungs-clear to auscultation bilaterally, no wheezing or crackles auscultated Abdomen-soft, nontender, no organomegaly Extremities-no edema  in the lower extremities Neuro-alert, oriented x3, no focal deficit noted  Condition at discharge: good  The results of significant diagnostics from this hospitalization (including imaging, microbiology, ancillary and laboratory) are listed below for reference.   Imaging Studies: ECHOCARDIOGRAM LIMITED  Result Date: 10/27/2022    ECHOCARDIOGRAM LIMITED REPORT   Patient Name:   DARLETTA TYNDALE Date of Exam: 10/27/2022 Medical Rec #:  [id number]        Height:       65.0 in Accession #:    [id number]       Weight:       140.6 lb Date of Birth:  [birth year]        BSA:          1.703 m Patient Age:    89 years         BP:           138/88 mmHg Patient Gender: F  HR:           67 bpm. Exam Location:  Inpatient Procedure: Limited Echo, Color Doppler and Strain Analysis Indications:    [id number] Acute systolic (congestive) heart failure  History:        Patient has prior history of Echocardiogram examinations, most                 recent 10/03/2022. CHF, Arrythmias:Atrial Fibrillation; Risk                 Factors:Hypertension and Dyslipidemia. Breast cancer.  Sonographer:    Rolla Etienne Referring Phys: [id number] [place] IMPRESSIONS  1. Left ventricular ejection fraction, by estimation, is 55 to 60%. The left ventricle has normal function. The left ventricle has no regional wall motion abnormalities.  2. Right ventricular systolic function is normal. The right ventricular size is mildly enlarged. There is severely elevated pulmonary artery systolic pressure. The estimated right ventricular systolic pressure is 123456 mmHg.  3. Left atrial size was severely dilated.  4. Right atrial size was severely dilated.  5. The mitral valve is normal in structure. Mild mitral valve regurgitation. No evidence of mitral stenosis.  6. Tricuspid valve regurgitation is severe.  7. The aortic valve is calcified. There is severe calcifcation of the aortic valve. There is moderate thickening of the aortic valve. Aortic valve  regurgitation is mild. No aortic stenosis is present.  8. The inferior vena cava is dilated in size with <50% respiratory variability, suggesting right atrial pressure of 15 mmHg. FINDINGS  Left Ventricle: Left ventricular ejection fraction, by estimation, is 55 to 60%. The left ventricle has normal function. The left ventricle has no regional wall motion abnormalities. The left ventricular internal cavity size was normal in size. There is  no left ventricular hypertrophy. Right Ventricle: The right ventricular size is mildly enlarged. No increase in right ventricular wall thickness. Right ventricular systolic function is normal. There is severely elevated pulmonary artery systolic pressure. The tricuspid regurgitant velocity is 3.35 m/s, and with an assumed right atrial pressure of 15 mmHg, the estimated right ventricular systolic pressure is 123456 mmHg. Left Atrium: Left atrial size was severely dilated. Right Atrium: Right atrial size was severely dilated. Pericardium: There is no evidence of pericardial effusion. Mitral Valve: The mitral valve is normal in structure. Mild mitral valve regurgitation. No evidence of mitral valve stenosis. Tricuspid Valve: The tricuspid valve is normal in structure. Tricuspid valve regurgitation is severe. No evidence of tricuspid stenosis. Aortic Valve: The aortic valve is calcified. There is severe calcifcation of the aortic valve. There is moderate thickening of the aortic valve. There is moderate aortic valve annular calcification. Aortic valve regurgitation is mild. No aortic stenosis is present. Pulmonic Valve: The pulmonic valve was normal in structure. Pulmonic valve regurgitation is not visualized. No evidence of pulmonic stenosis. Aorta: The aortic root is normal in size and structure. Venous: The inferior vena cava is dilated in size with less than 50% respiratory variability, suggesting right atrial pressure of 15 mmHg. IAS/Shunts: No atrial level shunt detected by color  flow Doppler. TRICUSPID VALVE TR Peak grad:   44.9 mmHg TR Vmax:        335.00 cm/s Aditya Sabharwal Electronically signed by Hebert Soho Signature Date/Time: 10/27/2022/4:44:37 PM    Final    DG Chest Port 1 View  Result Date: 10/25/2022 CLINICAL DATA:  Cough and shortness of breath EXAM: PORTABLE CHEST - 1 VIEW COMPARISON:  None available FINDINGS: Relatively low lung volumes.  Interstitial and airspace opacities involving bases more than apices, left greater than right. Mild cardiomegaly.  Aortic Atherosclerosis (ICD10-170.0). No effusion. Surgical clips project over the left mid lung. Regional bones unremarkable. IMPRESSION: 1. Asymmetric interstitial and airspace opacities, left greater than right. 2. Mild cardiomegaly. Electronically Signed   By: Lucrezia Europe M.D.   On: 10/25/2022 08:51    Microbiology: Results for orders placed or performed during the hospital encounter of 10/25/22  Blood culture (routine x 2)     Status: None (Preliminary result)   Collection Time: 10/25/22  9:00 AM   Specimen: BLOOD  Result Value Ref Range Status   Specimen Description BLOOD SITE NOT SPECIFIED  Final   Special Requests   Final    BOTTLES DRAWN AEROBIC AND ANAEROBIC Blood Culture results may not be optimal due to an inadequate volume of blood received in culture bottles   Culture   Final    NO GROWTH 4 DAYS Performed at [facility], [place] [address] [place], [place] [address]    Report Status PENDING  Incomplete  Resp panel by RT-PCR (RSV, Flu A&B, Covid) Anterior Nasal Swab     Status: None   Collection Time: 10/25/22  9:16 AM   Specimen: Anterior Nasal Swab  Result Value Ref Range Status   SARS Coronavirus 2 by RT PCR NEGATIVE NEGATIVE Final   Influenza A by PCR NEGATIVE NEGATIVE Final   Influenza B by PCR NEGATIVE NEGATIVE Final    Comment: (NOTE) The Xpert Xpress SARS-CoV-2/FLU/RSV plus assay is intended as an aid in the diagnosis of influenza from Nasopharyngeal swab specimens  and should not be used as a sole basis for treatment. Nasal washings and aspirates are unacceptable for Xpert Xpress SARS-CoV-2/FLU/RSV testing.  Fact Sheet for Patients: EntrepreneurPulse.com.au  Fact Sheet for Healthcare Providers: IncredibleEmployment.be  This test is not yet approved or cleared by the [place] FDA and has been authorized for detection and/or diagnosis of SARS-CoV-2 by FDA under an Emergency Use Authorization (EUA). This EUA will remain in effect (meaning this test can be used) for the duration of the COVID-19 declaration under Section 564(b)(1) of the Act, 21 U.S.C. section 360bbb-3(b)(1), unless the authorization is terminated or revoked.     Resp Syncytial Virus by PCR NEGATIVE NEGATIVE Final    Comment: (NOTE) Fact Sheet for Patients: EntrepreneurPulse.com.au  Fact Sheet for Healthcare Providers: IncredibleEmployment.be  This test is not yet approved or cleared by the [place] FDA and has been authorized for detection and/or diagnosis of SARS-CoV-2 by FDA under an Emergency Use Authorization (EUA). This EUA will remain in effect (meaning this test can be used) for the duration of the COVID-19 declaration under Section 564(b)(1) of the Act, 21 U.S.C. section 360bbb-3(b)(1), unless the authorization is terminated or revoked.  Performed at [facility], [place] [address] [place], [place] [address]   Blood culture (routine x 2)     Status: None (Preliminary result)   Collection Time: 10/25/22  9:18 AM   Specimen: BLOOD  Result Value Ref Range Status   Specimen Description BLOOD SITE NOT SPECIFIED  Final   Special Requests   Final    BOTTLES DRAWN AEROBIC AND ANAEROBIC Blood Culture results may not be optimal due to an inadequate volume of blood received in culture bottles   Culture   Final    NO GROWTH 4 DAYS Performed at [facility], [place] [address],  [place], [place] [address]    Report Status  PENDING  Incomplete  Respiratory (~20 pathogens) panel by PCR     Status: None   Collection Time: 10/25/22  1:31 PM   Specimen: Nasopharyngeal Swab; Respiratory  Result Value Ref Range Status   Adenovirus NOT DETECTED NOT DETECTED Final   Coronavirus 229E NOT DETECTED NOT DETECTED Final    Comment: (NOTE) The Coronavirus on the Respiratory Panel, DOES NOT test for the novel  Coronavirus (2019 nCoV)    Coronavirus HKU1 NOT DETECTED NOT DETECTED Final   Coronavirus NL63 NOT DETECTED NOT DETECTED Final   Coronavirus OC43 NOT DETECTED NOT DETECTED Final   Metapneumovirus NOT DETECTED NOT DETECTED Final   Rhinovirus / Enterovirus NOT DETECTED NOT DETECTED Final   Influenza A NOT DETECTED NOT DETECTED Final   Influenza B NOT DETECTED NOT DETECTED Final   Parainfluenza Virus 1 NOT DETECTED NOT DETECTED Final   Parainfluenza Virus 2 NOT DETECTED NOT DETECTED Final   Parainfluenza Virus 3 NOT DETECTED NOT DETECTED Final   Parainfluenza Virus 4 NOT DETECTED NOT DETECTED Final   Respiratory Syncytial Virus NOT DETECTED NOT DETECTED Final   Bordetella pertussis NOT DETECTED NOT DETECTED Final   Bordetella Parapertussis NOT DETECTED NOT DETECTED Final   Chlamydophila pneumoniae NOT DETECTED NOT DETECTED Final   Mycoplasma pneumoniae NOT DETECTED NOT DETECTED Final    Comment: Performed at [facility], [address], [place], [place] [address]    Labs: CBC: Recent Labs  Lab 10/25/22 0804 10/25/22 0818 10/26/22 0326 10/27/22 0655 10/28/22 0354 10/29/22 0225  WBC 15.9*  --  11.3* 10.8* 10.6* 11.0*  NEUTROABS 10.9*  --   --   --   --   --   HGB 8.8* 9.5*  9.5* 7.5* 7.2* 7.0* 8.3*  HCT 28.8* 28.0*  28.0* 26.0* 23.8* 23.4* 27.7*  MCV 81.1  --  83.3 79.6* 81.5 82.7  PLT 361  --  282 265 255 [id number]   Basic Metabolic Panel: Recent Labs  Lab 10/25/22 0804 10/25/22 0818 10/25/22 1306 10/26/22 0326 10/27/22 0655 10/28/22 0354 10/29/22 0225   NA 140 141  141  --  138 140 140 140  K 3.3* 3.4*  3.4*  --  3.5 3.1* 3.8 4.0  CL 106  --   --  105 102 104 103  CO2 23  --   --  26 25 28 29   GLUCOSE 126*  --   --  126* 107* 91 106*  BUN 21  --   --  17 17 19 15   CREATININE 1.08*  --   --  1.03* 1.13* 1.22* 1.02*  CALCIUM 9.5  --   --  9.0 8.9 8.6* 9.0  MG  --   --  1.7  --  1.7  --   --    Liver Function Tests: Recent Labs  Lab 10/25/22 0804  AST 16  ALT 13  ALKPHOS 43  BILITOT 1.3*  PROT 6.3*  ALBUMIN 3.6   CBG: No results for input(s): "GLUCAP" in the last 168 hours.  Discharge time spent: greater than 30 minutes.  Signed: [name], MD [facility] 10/29/2022

## 2022-10-29 NOTE — Plan of Care (Signed)
  Problem: Respiratory: Goal: Ability to maintain adequate ventilation will improve Outcome: Not Progressing   Problem: Respiratory: Goal: Ability to maintain a clear airway will improve Outcome: Not Progressing

## 2022-10-29 NOTE — TOC Transition Note (Signed)
Transition of Care [facility]) - CM/SW Discharge Note   Patient Details  Name: Mallory Stephens MRN: [id number] Date of Birth: 1932-08-19  [facility] of [facility]) CM/SW Contact:  Tom-Johnson, Renea Ee, RN Phone Number: 10/29/2022, 12:32 PM   Clinical Narrative:     Patient is scheduled for discharge today. Readmission Prevention Assessment done. Hospital f/u and discharge instructions on AVS. Home health info on AVS. BSC will be delivered to patient's home per daughter's request.  Family to transport at discharge. No further TOC needs noted.   Final next level of care: Home w Home Health Services Barriers to Discharge: Barriers Resolved   Patient Goals and CMS Choice CMS Medicare.gov Compare Post Acute Care list provided to:: Patient Choice offered to / list presented to : Patient  Discharge Placement                  Patient to be transferred to facility by: Daughter      Discharge Plan and Services Additional resources added to the After Visit Summary for     Discharge Planning Services: CM Consult Post Acute Care Choice: Home Health          DME Arranged: Bedside commode DME Agency: [employer] Date DME Agency Contacted: 10/28/22 Time DME Agency Contacted: 61 Representative spoke with at DME Agency: Erasmo Downer HH Arranged: PT, OT   Date [place]: 10/28/22 Time [place]: 1635    Social Determinants of Health ([place]) Interventions SDOH Screenings   Food Insecurity: No Food Insecurity (10/25/2022)  Housing: Low Risk  (10/25/2022)  Transportation Needs: No Transportation Needs (10/25/2022)  Utilities: Not At Risk (10/25/2022)  Tobacco Use: Low Risk  (10/25/2022)     Readmission Risk Interventions    10/28/2022    4:35 PM  Readmission Risk Prevention Plan  Transportation Screening Complete  PCP or Specialist Appt within 5-7 Days Complete  Home Care Screening Complete  Medication Review (RN CM) Referral to Pharmacy

## 2022-10-29 NOTE — Progress Notes (Cosign Needed)
    Durable Medical Equipment  (From admission, onward)           Start     Ordered   10/29/22 1226  For home use only DME Bedside commode  Once       Comments: Pt's generalized weakness and decreased activity tolerance necessitate recommendation for bedside commode as she is not abe to ambulate to the bathroom.  Question:  Patient needs a bedside commode to treat with the following condition  Answer:  Weakness   10/29/22 1227

## 2022-10-29 NOTE — Discharge Instructions (Signed)
Check your blood pressure daily.  If systolic blood pressure is less than 100, stop taking metoprolol, Lasix and Cozaar.  Call your PCP for further recommendations.

## 2022-10-29 NOTE — Plan of Care (Signed)
  Problem: Activity: Goal: Ability to tolerate increased activity will improve Outcome: Adequate for Discharge   Problem: Clinical Measurements: Goal: Ability to maintain a body temperature in the normal range will improve Outcome: Adequate for Discharge   Problem: Respiratory: Goal: Ability to maintain adequate ventilation will improve Outcome: Adequate for Discharge Goal: Ability to maintain a clear airway will improve Outcome: Adequate for Discharge   Problem: Clinical Measurements: Goal: Ability to maintain clinical measurements within normal limits will improve Outcome: Adequate for Discharge Goal: Will remain free from infection Outcome: Adequate for Discharge Goal: Diagnostic test results will improve Outcome: Adequate for Discharge Goal: Respiratory complications will improve Outcome: Adequate for Discharge Goal: Cardiovascular complication will be avoided Outcome: Adequate for Discharge   Problem: Activity: Goal: Risk for activity intolerance will decrease Outcome: Adequate for Discharge   Problem: Nutrition: Goal: Adequate nutrition will be maintained Outcome: Adequate for Discharge   Problem: Coping: Goal: Level of anxiety will decrease Outcome: Adequate for Discharge   Problem: Elimination: Goal: Will not experience complications related to bowel motility Outcome: Adequate for Discharge Goal: Will not experience complications related to urinary retention Outcome: Adequate for Discharge

## 2022-10-29 NOTE — Progress Notes (Signed)
SATURATION QUALIFICATIONS: (This note is used to comply with regulatory documentation for home oxygen)  Patient Saturations on Room Air at Rest = 92%  Patient Saturations on Room Air while Ambulating = 92%    Please briefly explain why patient needs home oxygen: No needs noted. pt sat remained above 90% while ambulating and did not require oxygen

## 2022-10-29 NOTE — Plan of Care (Signed)
Patient ID: Mallory Stephens, female   DOB: [birth year], 89 y.o.   MRN: [id number]  Problem: Activity: Goal: Ability to tolerate increased activity will improve Outcome: Adequate for Discharge   Problem: Clinical Measurements: Goal: Ability to maintain a body temperature in the normal range will improve Outcome: Adequate for Discharge   Problem: Respiratory: Goal: Ability to maintain adequate ventilation will improve Outcome: Adequate for Discharge Goal: Ability to maintain a clear airway will improve Outcome: Adequate for Discharge   Problem: Clinical Measurements: Goal: Ability to maintain clinical measurements within normal limits will improve Outcome: Adequate for Discharge Goal: Will remain free from infection Outcome: Adequate for Discharge Goal: Diagnostic test results will improve Outcome: Adequate for Discharge Goal: Respiratory complications will improve Outcome: Adequate for Discharge Goal: Cardiovascular complication will be avoided Outcome: Adequate for Discharge   Problem: Activity: Goal: Risk for activity intolerance will decrease Outcome: Adequate for Discharge   Problem: Nutrition: Goal: Adequate nutrition will be maintained Outcome: Adequate for Discharge   Problem: Coping: Goal: Level of anxiety will decrease Outcome: Adequate for Discharge   Problem: Elimination: Goal: Will not experience complications related to bowel motility Outcome: Adequate for Discharge Goal: Will not experience complications related to urinary retention Outcome: Adequate for Discharge   Problem: Acute Rehab OT Goals (only OT should resolve) Goal: Pt. Will Perform Lower Body Bathing Outcome: Adequate for Discharge Goal: Pt. Will Perform Lower Body Dressing Outcome: Adequate for Discharge Goal: Pt. Will Transfer To Toilet Outcome: Adequate for Discharge Goal: Pt. Will Perform Toileting-Clothing Manipulation Outcome: Adequate for Discharge Goal: Pt/Caregiver Will Perform  Home Exercise Program Outcome: Adequate for Discharge Goal: OT Additional ADL Goal #1 Outcome: Adequate for Discharge   Problem: Acute Rehab PT Goals(only PT should resolve) Goal: Pt Will Go Supine/Side To Sit Outcome: Adequate for Discharge Goal: Patient Will Transfer Sit To/From Stand Outcome: Adequate for Discharge Goal: Pt Will Ambulate Outcome: Adequate for Discharge Goal: Pt Will Go Up/Down Stairs Outcome: Adequate for Discharge  Haydee Salter, RN

## 2022-10-29 NOTE — Progress Notes (Signed)
Occupational Therapy Treatment Patient Details Name: Mallory Stephens MRN: [id number] DOB: 1932-10-15 Today's Date: 10/29/2022   History of present illness Patient is a 89 yo female presenting to the ED with SOB on 10/25/22. PMH includes:atrial fibrillation, HTN, HLD, breast cancer   OT comments  Patient received sitting on toilet and was able to perform clothing management with supervision and min guard to ambulate to recliner. Patient performed was able to tolerate 10 minutes of standing ADL activities and mobility in room and hallway with RW. Following performing self care on RA patient SpO2 was 89 and increased to 94% with pursed lip breathing. Patient has made great gains and is expected to discharge home today and would benefit from [facility] to further address activity tolerance and self care.    Recommendations for follow up therapy are one component of a multi-disciplinary discharge planning process, led by the attending physician.  Recommendations may be updated based on patient status, additional functional criteria and insurance authorization.    Assistance Recommended at Discharge Intermittent Supervision/Assistance  Patient can return home with the following  A little help with walking and/or transfers;A little help with bathing/dressing/bathroom;Assistance with cooking/housework;Assist for transportation;Help with stairs or ramp for entrance   Equipment Recommendations  BSC/3in1    Recommendations for Other Services      Precautions / Restrictions Precautions Precautions: Fall Precaution Comments: watch O2 Restrictions Weight Bearing Restrictions: No       Mobility Bed Mobility Overal bed mobility: Needs Assistance             General bed mobility comments: OOB upon arrival    Transfers Overall transfer level: Needs assistance Equipment used: Rolling walker (2 wheels) Transfers: Sit to/from Stand Sit to Stand: Min guard           General transfer comment:  min guard for safety     Balance Overall balance assessment: Mild deficits observed, not formally tested                                         ADL either performed or assessed with clinical judgement   ADL Overall ADL's : Needs assistance/impaired     Grooming: Wash/dry hands;Wash/dry face;Oral care;Supervision/safety;Standing Grooming Details (indicate cue type and reason): at sink         Upper Body Dressing : Set up;Sitting Upper Body Dressing Details (indicate cue type and reason): gown for back side Lower Body Dressing: Minimal assistance;Sitting/lateral leans;Sit to/from stand   Toilet Transfer: Min guard;Ambulation;Rolling walker (2 wheels);Comfort height toilet   Toileting- Clothing Manipulation and Hygiene: Sitting/lateral lean;Supervision/safety Toileting - Clothing Manipulation Details (indicate cue type and reason): mod I for toilet hygiene and supervision for clothing management while standing       General ADL Comments: able to tolerate 10 minutes of standing ADL activities    Extremity/Trunk Assessment              Vision       Perception     Praxis      Cognition Arousal/Alertness: Awake/alert Behavior During Therapy: WFL for tasks assessed/performed Overall Cognitive Status: Within Functional Limits for tasks assessed                                          Exercises  Shoulder Instructions       General Comments on RA with SpO89 following standing for self care 94% after rest    Pertinent Vitals/ Pain       Pain Assessment Pain Assessment: No/denies pain  Home Living                                          Prior Functioning/Environment              Frequency  Min 2X/week        Progress Toward Goals  OT Goals(current goals can now be found in the care plan section)  Progress towards OT goals: Progressing toward goals  Acute Rehab OT Goals Patient Stated  Goal: go home OT Goal Formulation: With patient/family Time For Goal Achievement: 11/08/22 Potential to Achieve Goals: Good ADL Goals Pt Will Perform Lower Body Bathing: with modified independence;sitting/lateral leans;sit to/from stand Pt Will Perform Lower Body Dressing: with modified independence;sit to/from stand;sitting/lateral leans Pt Will Transfer to Toilet: with modified independence;ambulating;regular height toilet;grab bars Pt Will Perform Toileting - Clothing Manipulation and hygiene: with modified independence;sitting/lateral leans;sit to/from stand Pt/caregiver will Perform Home Exercise Program: Increased strength;With written HEP provided;With Supervision Additional ADL Goal #1: Patient will be able to complete functional task in standing for 3-5 minutes prior to needing seated rest break.  Plan Discharge plan remains appropriate;Frequency remains appropriate    Co-evaluation                 AM-PAC OT "6 Clicks" Daily Activity     Outcome Measure   Help from another person eating meals?: None Help from another person taking care of personal grooming?: None Help from another person toileting, which includes using toliet, bedpan, or urinal?: A Little Help from another person bathing (including washing, rinsing, drying)?: A Little Help from another person to put on and taking off regular upper body clothing?: None Help from another person to put on and taking off regular lower body clothing?: A Little 6 Click Score: 21    End of Session Equipment Utilized During Treatment: Gait belt;Rolling walker (2 wheels)  OT Visit Diagnosis: Unsteadiness on feet (R26.81);Other abnormalities of gait and mobility (R26.89);History of falling (Z91.81);Muscle weakness (generalized) (M62.81)   Activity Tolerance Patient tolerated treatment well   Patient Left in chair;with call bell/phone within reach;with chair alarm set   Nurse Communication Mobility status        Time:  [id number] OT Time Calculation (min): 25 min  Charges: OT General Charges $OT Visit: 1 Visit OT Treatments $Self Care/Home Management : 8-22 mins $Therapeutic Activity: 8-22 mins  .rlo  Trixie Dredge 10/29/2022, 11:51 AM

## 2022-10-30 ENCOUNTER — Other Ambulatory Visit: Payer: Self-pay | Admitting: Cardiology

## 2022-10-30 LAB — CULTURE, BLOOD (ROUTINE X 2)
Culture: NO GROWTH
Culture: NO GROWTH

## 2022-11-05 ENCOUNTER — Telehealth: Payer: Self-pay | Admitting: Cardiology

## 2022-11-05 NOTE — Telephone Encounter (Signed)
Sister is calling in to see if the patient needs to keep her appt with the pulmonary dr, being that she is doing better. Please advise

## 2022-11-05 NOTE — Telephone Encounter (Signed)
Unable to leave voicemail.

## 2022-11-05 NOTE — Telephone Encounter (Signed)
Spoke with patient's sister (DPR) and confirmed that patient was referred to pulmonology for work up for pulmonary hypertension after she had her echocardiogram. Sister verbalizes understanding and agrees to keep pulmonology appt.

## 2022-11-08 LAB — LAB REPORT - SCANNED: EGFR: 48

## 2022-11-11 ENCOUNTER — Encounter: Payer: Self-pay | Admitting: Internal Medicine

## 2022-11-11 ENCOUNTER — Ambulatory Visit (INDEPENDENT_AMBULATORY_CARE_PROVIDER_SITE_OTHER): Payer: Medicare HMO | Admitting: Internal Medicine

## 2022-11-11 VITALS — BP 122/70 | HR 67 | Temp 97.6°F | Ht 65.0 in | Wt 135.6 lb

## 2022-11-11 DIAGNOSIS — I2729 Other secondary pulmonary hypertension: Secondary | ICD-10-CM | POA: Diagnosis not present

## 2022-11-11 DIAGNOSIS — I4821 Permanent atrial fibrillation: Secondary | ICD-10-CM | POA: Diagnosis not present

## 2022-11-11 NOTE — Progress Notes (Signed)
[name]    [id number]    May 20, 1933  Primary Care Physician:Wolters, Jasmine December, MD  Referring Physician: Quintella Reichert, MD [phone] [address] [place],  [place] [address] Reason for Consultation: shortness of breath Date of Consultation: 11/11/2022  Chief complaint:   Chief Complaint  Patient presents with   Follow-up    Sob during exertion. Pt states this started suddenly. Ed 3/23     HPI: Mallory Stephens is a 89 y.o. woman with past medical history of atrial fibrillation (on eliquis and metoprolol) who presents for shortness of breath.   She has dyspnea with minimal exertion, able to complete ADLs but can't do things like vacuum, grocery shopping, doing laundry.   Was admitted for dyspnea and hypoxemic respiratory failure felt to be secondary to pulmonary edema. She now has been started on lasix 20 mg every other day and this seems to be keeping the fluid in her legs at a minimum. She actually feels better now. Echocardiogram showed  No personal or family history of autoimmune disease  She has some iron deficiency anemia and is improving with Hgb counts.   No sleep apnea. No history of blood clots. No recurrent pneumonia or bronchitis.   Due to start physical therapy soon - having some gait instability which limits her activity right now Denies cough, wheezing, chest tightness.   Social history:  Occupation: Warehouse manager work.  Exposures: lives with her sister.  Smoking history: 30 pack years, quit in 1989  Social History   Occupational History   Not on file  Tobacco Use   Smoking status: Former    Packs/day: 1.00    Years: 30.00    Additional pack years: 0.00    Total pack years: 30.00    Types: Cigarettes   Smokeless tobacco: Never  Vaping Use   Vaping Use: Never used  Substance and Sexual Activity   Alcohol use: Yes    Comment: occasional wine   Drug use: No   Sexual activity: Not on file    Relevant family history:  Family  History  Problem Relation Age of Onset   Stroke Mother    Stroke Father    Breast cancer Sister        dx 50s   Arthritis Sister    Scoliosis Sister    Heart disease Brother    Prostate cancer Brother        dx >50, metastatic   Prostate cancer Brother        dx >50, metastatic   Prostate cancer Nephew        dx <50   Breast cancer Niece        dx <50   Breast cancer Niece        dx <50   Autoimmune disease Neg Hx     Past Medical History:  Diagnosis Date   Breast cancer 07/24/2020   Dyslipidemia    Dysrhythmia    atrial fibrillation   Edema extremities 09/03/2016   Family history of breast cancer    Family history of prostate cancer    Fatty liver    GERD (gastroesophageal reflux disease)    History of basal cell carcinoma    Hypertension    Lymphocytosis    followed by Dr. Arbutus Ped   Multinodular goiter    Osteoarthritis    Permanent atrial fibrillation    atrial fibrillation s/p DCCV 3/10 and 6/10   PONV (postoperative nausea  and vomiting)    PUD (peptic ulcer disease)    Pulmonary nodules    followed  by Dr. Paulino RilyWolters   UTI (urinary tract infection) 04/2017    Past Surgical History:  Procedure Laterality Date   aspiration of cyst  1978   BREAST LUMPECTOMY WITH RADIOACTIVE SEED LOCALIZATION Left 09/06/2020   Procedure: LEFT BREAST LUMPECTOMY WITH RADIOACTIVE SEED LOCALIZATION;  Surgeon: Almond LintByerly, Faera, MD;  Location: [facility] OR;  Service: General;  Laterality: Left;   BREAST SURGERY  1962   right breast for benign lesion   BREAST SURGERY  1971   bilateral lumpectomy for beingn lesions   CARDIOVERSION  07/22/2011   Procedure: CARDIOVERSION;  Surgeon: Quintella Reichertraci R Turner, MD;  Location: [facility] OR;  Service: Cardiovascular;  Laterality: N/A;   CARDIOVERSION  09/02/2011   Procedure: CARDIOVERSION;  Surgeon: Quintella Reichertraci R Turner, MD;  Location: [facility] OR;  Service: Cardiovascular;  Laterality: N/A;   cataract     cataract removal-bilateral   CHOLECYSTECTOMY  2003   CYSTOSCOPY      DILATION AND CURETTAGE OF UTERUS  1979   JOINT REPLACEMENT  2002   Right knee   JOINT REPLACEMENT  2007   left knee     Physical Exam: Blood pressure 122/70, pulse 67, temperature 97.6 F (36.4 C), temperature source Oral, height 5\' 5"  (1.651 m), weight 135 lb 9.6 oz (61.5 kg), SpO2 97 %. Gen:      No acute distress, elderly, well preserved ENT:  no nasal polyps, mucus membranes moist Lungs:    No increased respiratory effort, symmetric chest wall excursion, clear to auscultation bilaterally, no wheezes or crackles CV:         irregularly irregular, HR in 60s; no murmurs, rubs, or gallops.  No pedal edema Abd:      + bowel sounds; soft, non-tender; no distension MSK: no acute synovitis of DIP or PIP joints, no mechanics hands.  Skin:      Warm and dry; no rashes, few varicose veins Neuro: normal speech, no focal facial asymmetry Psych: alert and oriented x3, normal mood and affect   Data Reviewed/Medical Decision Making:  Independent interpretation of tests: Imaging:  Review of patient's chest xray October 25 2022 images revealed cardiomegaly and pulmonary vascular congestion. The patient's images have been independently reviewed by me.    CT Chest 2011   1.  Stable 5 mm pulmonary nodule in the left lower lobe over 1  year time is consistent with benign etiology per Fleischner  criteria.  2. Evidence of prior granulomas disease.    3. Enlarged left lobe of thyroid gland.  This was evaluated on  prior ultrasound of 05/08/2009.   PFTs:  Labs:  Lab Results  Component Value Date   NA 140 10/29/2022   K 4.0 10/29/2022   CO2 29 10/29/2022   GLUCOSE 106 (H) 10/29/2022   BUN 15 10/29/2022   CREATININE 1.02 (H) 10/29/2022   CALCIUM 9.0 10/29/2022   EGFR 44 (L) 09/17/2021   GFRNONAA 53 (L) 10/29/2022   Lab Results  Component Value Date   WBC 11.0 (H) 10/29/2022   HGB 8.3 (L) 10/29/2022   HCT 27.7 (L) 10/29/2022   MCV 82.7 10/29/2022   PLT 289 10/29/2022      Immunization status:  Immunization History  Administered Date(s) Administered   Fluad Quad(high Dose 65+) 04/13/2019   H1N1 07/18/2008   Influenza Split 04/20/2008, 04/16/2009, 05/31/2010, 05/19/2011, 06/01/2012, 05/10/2013, 04/17/2015   Influenza,inj,Quad PF,6+ Mos 05/01/2016, 05/22/2017, 05/08/2020  Influenza,inj,Quad PF,6-35 Mos 03/29/2018   Influenza,inj,quad, With Preservative 04/12/2014   Influenza-Unspecified 04/04/2014   PFIZER(Purple Top)SARS-COV-2 Vaccination 08/29/2019, 09/19/2019, 05/30/2020   Pfizer Covid-19 Vaccine Bivalent Booster 38yrs & up 06/13/2021   Pneumococcal Conjugate-13 04/17/2015   Pneumococcal Polysaccharide-23 08/05/2003, 05/22/2017   Tdap 11/09/2012   Zoster, Live 05/16/2008     I reviewed prior external note(s) from cardiology, ED  I reviewed the result(s) of the labs and imaging as noted above.   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  This is called pulmonary hypertension.    There are several different causes for pulmonary hypertension, but in someone with your medical history the most likely reason is chronic heart and lung disease.  You did smoke but it does not appear to have caused substantial chronic lung disease such as COPD.    Right now regardless of what the reason is for this pulmonary hypertension, the mainstay and therapy would be diuretic such as Lasix to help keep fluid from building up around your lungs.  Continue to take the Lasix 20 mg as prescribed, and take care to weigh yourself daily to make sure fluid is not building up.  More than 2 pounds in 1 day or 5 pounds in 2 days is most likely fluid building up and may require some extra Lasix.  If you have a change in your symptoms, or  your breathing worsens please do not hesitate to come back and see me.  We discussed disease management and progression at length today.   I spent 45 minutes in the care of this patient today including pre-charting, chart review, review of results, face-to-face care, coordination of care and communication with consultants etc.).  Return to Care: Return if symptoms worsen or fail to improve.  Durel Salts, MD Pulmonary and Critical Care Medicine  HealthCare Office:[phone]  CC: Quintella Reichert, MD

## 2022-11-11 NOTE — Patient Instructions (Addendum)
Your doctor sent you to come see me due to elevated pressures in the blood vessels that connects your heart to your lungs.  This is called pulmonary hypertension.  There are several different causes for pulmonary hypertension, but in someone with your medical history the most likely reason is chronic heart and lung disease.  You did smoke but it does not appear to have caused substantial chronic lung disease such as COPD.  Right now regardless of what the reason is for this pulmonary hypertension, the mainstay and therapy would be diuretic such as Lasix to help keep fluid from building up around your lungs.  Continue to take the Lasix 20 mg as prescribed, and take care to weigh yourself daily to make sure fluid is not building up.  More than 2 pounds in 1 day or 5 pounds in 2 days is most likely fluid building up and may require some extra Lasix.  If you have a change in your symptoms, or your breathing worsens please do not hesitate to come back and see me.

## 2023-02-08 ENCOUNTER — Other Ambulatory Visit: Payer: Self-pay | Admitting: Cardiology

## 2023-02-08 DIAGNOSIS — I4821 Permanent atrial fibrillation: Secondary | ICD-10-CM

## 2023-02-09 NOTE — Telephone Encounter (Signed)
Prescription refill request for Eliquis received. Indication:afib Last office visit:1/24 Scr:1.11  4/24 Age: 89 Weight:61.5  kg  Prescription refilled

## 2023-02-24 ENCOUNTER — Encounter: Payer: Self-pay | Admitting: Hematology and Oncology

## 2023-02-26 ENCOUNTER — Other Ambulatory Visit: Payer: Self-pay | Admitting: Hematology and Oncology

## 2023-02-26 DIAGNOSIS — Z170 Estrogen receptor positive status [ER+]: Secondary | ICD-10-CM

## 2023-02-26 DIAGNOSIS — D649 Anemia, unspecified: Secondary | ICD-10-CM

## 2023-02-26 NOTE — Progress Notes (Signed)
Symptom Management Consult Note [facility]    Patient Care Team: Mila Palmer, MD as PCP - General (Family Medicine) Quintella Reichert, MD as PCP - Cardiology (Cardiology) Magrinat, Valentino Hue, MD (Inactive) as Consulting Physician (Oncology) Dorothy Puffer, MD as Consulting Physician (Radiation Oncology) Almond Lint, MD as Consulting Physician (General Surgery) Quintella Reichert, MD as Consulting Physician (Cardiology) Pershing Proud, RN as Oncology Nurse Navigator Donnelly Angelica, RN as Oncology Nurse Navigator Shea Evans, MD as Consulting Physician (Obstetrics and Gynecology) Cherlyn Roberts, MD as Referring Physician (Dermatology) Mila Palmer, MD (Family Medicine)    Name / MRN / DOB: Mallory Stephens  [id number]  Apr 24, 1933   Date of visit: 02/27/2023   Chief Complaint/Reason for visit: anemia   Current Therapy: Anastrozole   ASSESSMENT & PLAN: Patient is a 89 y.o. female  with oncologic history of malignant neoplasm of upper-inner quadrant of left breast, estrogen receptor positivefollowed by Dr. Pamelia Hoit.  I have viewed most recent oncology note and lab work.    #Malignant neoplasm of upper-inner quadrant of left breast, estrogen receptor positive  -Chart review shows last mammogram was 07/30/22 and showed no evidence of malignancy bilaterally. -Still taking Anastrozole - Oncology follow up for breast cancer 06/18/23  #Anemia -New finding during hospital admission in March 2024. Per chart review plan was for outpatient follow up. Provided stool sample to PCP recently and FOBT is negative per chart review. Hgb in 08/2022, normal at 12.0 with no hx of previous anemia. Patient is currently taking PO ferrous sulfate. -HGB today is 8.5, no indication for transfusion. Patient with chronic SOB, no acute changes. -Will collect labs today to initiate anemia work up including iron studies, ferritin, B12, SPEP, TSH, LDH and reticulocyte panel.  Patient will  follow-up with primary oncology team next week to discuss results and plan.  #Fall -Patient had mechanical fall x3 weeks ago which resulted in her hitting her chest on the arm of her toilet chair. No LOC or head injury. Has been ambulatory since without difficulty. -Has nonproductive cough. Clear lung exam and no hypoxia. No tenderness to palpation of chest. -With cough and recent fall will get chest xray to evaluate for rib fractures and to r/o infectious process with her cough.  -Patient has other appointments today and is unable to have chest xray. She plans to return early next week for it.  -Encourage patient to take Tylenol for pain if needed and to brace herself when coughing.  Strict ED precautions discussed should symptoms worsen.    Heme/Onc History: Oncology History  Malignant neoplasm of upper-inner quadrant of left breast in female, estrogen receptor positive (HCC)  08/20/2020 Initial Diagnosis   Malignant neoplasm of upper-inner quadrant of left breast in female, estrogen receptor positive (HCC)   08/23/2020 Cancer Staging   Staging form: Breast, AJCC 8th Edition - Clinical stage from 08/23/2020: Stage IA (cT1c, cN0, cM0, G2, ER+, PR+, HER2-) - Signed by Lowella Dell, MD on 08/29/2020   11/26/2020 Genetic Testing   Negative genetic testing:  No pathogenic variants detected on the [employer] CancerNext-Expanded + RNAinsight panel. A variant of uncertain significance (VUS) was detected in the MSH6 gene called p.E1310D (c.3930G>C). The report date is 11/26/2020.  The CancerNext-Expanded + RNAinsight gene panel offered by [employer] and includes sequencing and rearrangement analysis for the following 77 genes: AIP, ALK, APC, ATM, AXIN2, BAP1, BARD1, BLM, BMPR1A, BRCA1, BRCA2, BRIP1, CDC73, CDH1, CDK4, CDKN1B, CDKN2A, CHEK2, CTNNA1, DICER1, FANCC,  FH, FLCN, GALNT12, KIF1B, LZTR1, MAX, MEN1, MET, MLH1, MSH2, MSH3, MSH6, MUTYH, NBN, NF1, NF2, NTHL1, PALB2, PHOX2B, PMS2, POT1,  PRKAR1A, PTCH1, PTEN, RAD51C, RAD51D, RB1, RECQL, RET, SDHA, SDHAF2, SDHB, SDHC, SDHD, SMAD4, SMARCA4, SMARCB1, SMARCE1, STK11, SUFU, TMEM127, TP53, TSC1, TSC2, VHL and XRCC2 (sequencing and deletion/duplication); EGFR, EGLN1, HOXB13, KIT, MITF, PDGFRA, POLD1 and POLE (sequencing only); EPCAM and GREM1 (deletion/duplication only). RNA data is routinely analyzed for use in variant interpretation for all genes.        Interval history-: Mallory Stephens is a 89 y.o. female with oncologic history as above presenting to [facility] today with chief complaint of fall and anemia.  She is accompanied by her sister as well as her niece on speaker phone.  Patient states she fell x 3 weeks ago.  She was in the bathroom and tripped over the leg of her toilet chair.  She fell into the chair and hit her chest on the arm of it.  There was no LOC and she did not hit her head.  Patient states since the fall she has intermittent pain in her chest that is worse when she coughs.  She describes the pain as sore and aching.  She is unable to rate the pain on the pain scale.  She does state that her cough is nonproductive.  She does not have any bruising on her chest.  She did take Tylenol 1 time for the pain and did have some relief, otherwise has not take anything over-the-counter.  She denies any fever or chills.  Patient is also asking for workup for her anemia.  This was found when she was admitted to the hospital in March 2024.  It was encouraged that she have outpatient valuation for this however patient reports they are just now getting around to starting that process.  She turned a stool sample into her PCP recently, unsure of the results.  Patient states she has dizziness when changing positions only that resolves quickly.  This been going on for a while and is unchanged.  She admits to shortness of breath with exertion however it is unchanged over the last several months.  She denies any chills, fatigue, shortness of breath,  abnormal bleeding or bruising, blood in her stool.  Patient is taking her p.o. iron supplements.  Patient and family are wondering if there is a treatable cause for her anemia.   ROS  All other systems are reviewed and are negative for acute change except as noted in the HPI.    Allergies  Allergen Reactions   Sulfa Antibiotics    Sulfa Drugs Cross Reactors Rash     Past Medical History:  Diagnosis Date   Breast cancer (HCC) 07/24/2020   Dyslipidemia    Dysrhythmia    atrial fibrillation   Edema extremities 09/03/2016   Family history of breast cancer    Family history of prostate cancer    Fatty liver    GERD (gastroesophageal reflux disease)    History of basal cell carcinoma    Hypertension    Lymphocytosis    followed by Dr. Arbutus Ped   Multinodular goiter    Osteoarthritis    Permanent atrial fibrillation (HCC)    atrial fibrillation s/p DCCV 3/10 and 6/10   PONV (postoperative nausea and vomiting)    PUD (peptic ulcer disease)    Pulmonary nodules    followed  by Dr. Paulino Rily   UTI (urinary tract infection) 04/2017     Past Surgical History:  Procedure Laterality Date   aspiration of cyst  1978   BREAST LUMPECTOMY WITH RADIOACTIVE SEED LOCALIZATION Left 09/06/2020   Procedure: LEFT BREAST LUMPECTOMY WITH RADIOACTIVE SEED LOCALIZATION;  Surgeon: Almond Lint, MD;  Location: [facility] OR;  Service: General;  Laterality: Left;   BREAST SURGERY  1962   right breast for benign lesion   BREAST SURGERY  1971   bilateral lumpectomy for beingn lesions   CARDIOVERSION  07/22/2011   Procedure: CARDIOVERSION;  Surgeon: Quintella Reichert, MD;  Location: [facility] OR;  Service: Cardiovascular;  Laterality: N/A;   CARDIOVERSION  09/02/2011   Procedure: CARDIOVERSION;  Surgeon: Quintella Reichert, MD;  Location: [facility] OR;  Service: Cardiovascular;  Laterality: N/A;   cataract     cataract removal-bilateral   CHOLECYSTECTOMY  2003   CYSTOSCOPY     DILATION AND CURETTAGE OF UTERUS  1979   JOINT  REPLACEMENT  2002   Right knee   JOINT REPLACEMENT  2007   left knee    Social History   Socioeconomic History   Marital status: Widowed    Spouse name: Not on file   Number of children: Not on file   Years of education: Not on file   Highest education level: Not on file  Occupational History   Not on file  Tobacco Use   Smoking status: Former    Current packs/day: 1.00    Average packs/day: 1 pack/day for 30.0 years (30.0 ttl pk-yrs)    Types: Cigarettes   Smokeless tobacco: Never  Vaping Use   Vaping status: Never Used  Substance and Sexual Activity   Alcohol use: Yes    Comment: occasional wine   Drug use: No   Sexual activity: Not on file  Other Topics Concern   Not on file  Social History Narrative   ** Merged History Encounter **       Social Determinants of Health   Financial Resource Strain: Not on file  Food Insecurity: No Food Insecurity (10/25/2022)   Hunger Vital Sign    Worried About Running Out of Food in the Last Year: Never true    Ran Out of Food in the Last Year: Never true  Transportation Needs: No Transportation Needs (10/25/2022)   PRAPARE - Administrator, Civil Service (Medical): No    Lack of Transportation (Non-Medical): No  Physical Activity: Not on file  Stress: Not on file  Social Connections: Not on file  Intimate Partner Violence: Not At Risk (10/25/2022)   Humiliation, Afraid, Rape, and Kick questionnaire    Fear of Current or Ex-Partner: No    Emotionally Abused: No    Physically Abused: No    Sexually Abused: No    Family History  Problem Relation Age of Onset   Stroke Mother    Stroke Father    Breast cancer Sister        dx 61s   Arthritis Sister    Scoliosis Sister    Heart disease Brother    Prostate cancer Brother        dx >50, metastatic   Prostate cancer Brother        dx >50, metastatic   Prostate cancer Nephew        dx <50   Breast cancer Niece        dx <50   Breast cancer Niece        dx  <50   Autoimmune disease Neg Hx      Current  Outpatient Medications:    acetaminophen (TYLENOL) 325 MG tablet, Take 325 mg by mouth every 6 (six) hours as needed for moderate pain., Disp: , Rfl:    amLODipine (NORVASC) 5 MG tablet, TAKE 1 TABLET (5 MG TOTAL) BY MOUTH DAILY., Disp: 90 tablet, Rfl: 3   anastrozole (ARIMIDEX) 1 MG tablet, Take 1 tablet (1 mg total) by mouth daily., Disp: 90 tablet, Rfl: 3   anastrozole (ARIMIDEX) 1 MG tablet, Take 1 mg by mouth daily., Disp: , Rfl:    cholecalciferol (VITAMIN D3) 25 MCG (1000 UNIT) tablet, Take 1 tablet (1,000 Units total) by mouth daily., Disp: 90 tablet, Rfl: 4   ELIQUIS 5 MG TABS tablet, TAKE 1 TABLET TWICE A DAY, Disp: 180 tablet, Rfl: 1   ELIQUIS 5 MG TABS tablet, Take 5 mg by mouth 2 (two) times daily., Disp: , Rfl:    famotidine (PEPCID) 40 MG tablet, Take 40 mg by mouth at bedtime., Disp: , Rfl:    famotidine (PEPCID) 40 MG tablet, Take 40 mg by mouth daily., Disp: , Rfl:    ferrous gluconate (FERGON) 324 MG tablet, Take 1 tablet (324 mg total) by mouth 2 (two) times daily with a meal., Disp: 30 tablet, Rfl: 3   furosemide (LASIX) 20 MG tablet, Take 1 tablet (20 mg total) by mouth every other day., Disp: 30 tablet, Rfl: 2   ibandronate (BONIVA) 150 MG tablet, Take 1 tablet (150 mg total) by mouth every 30 (thirty) days. Take in the morning with a full glass of water, on an empty stomach, and do not take anything else by mouth or lie down for the next 30 min. Take on the 10th day of each month., Disp: 3 tablet, Rfl: 4   ibandronate (BONIVA) 150 MG tablet, Take 150 mg by mouth every 30 (thirty) days., Disp: , Rfl:    losartan (COZAAR) 100 MG tablet, TAKE 1 TABLET BY MOUTH EVERY DAY, Disp: 90 tablet, Rfl: 1   losartan (COZAAR) 25 MG tablet, Take 1 tablet (25 mg total) by mouth daily., Disp: 30 tablet, Rfl: 11   metoprolol tartrate (LOPRESSOR) 25 MG tablet, TAKE 1 TABLET TWICE A DAY, Disp: 180 tablet, Rfl: 3   metoprolol tartrate (LOPRESSOR)  25 MG tablet, Take 25 mg by mouth 2 (two) times daily., Disp: , Rfl:    Multiple Vitamin (MULTIVITAMIN WITH MINERALS) TABS tablet, Take 1 tablet by mouth daily. (Patient not taking: Reported on 09/01/2022), Disp: , Rfl:    PARoxetine (PAXIL) 10 MG tablet, Take 10 mg by mouth every morning., Disp: , Rfl:    PARoxetine (PAXIL) 10 MG tablet, Take 10 mg by mouth daily., Disp: , Rfl:    polyvinyl alcohol (LIQUIFILM TEARS) 1.4 % ophthalmic solution, Place 1-2 drops into both eyes every 6 (six) hours as needed for dry eyes., Disp: , Rfl:    pravastatin (PRAVACHOL) 20 MG tablet, Take 20 mg by mouth at bedtime., Disp: , Rfl:    pravastatin (PRAVACHOL) 20 MG tablet, Take 20 mg by mouth daily., Disp: , Rfl:    solifenacin (VESICARE) 5 MG tablet, Take 5 mg by mouth daily., Disp: , Rfl:    solifenacin (VESICARE) 5 MG tablet, Take 5 mg by mouth daily., Disp: , Rfl:   PHYSICAL EXAM: ECOG FS:1 - Symptomatic but completely ambulatory    Vitals:   02/27/23 1107  BP: 137/63  Pulse: 69  Resp: 18  Temp: 98.1 F (36.7 C)  TempSrc: Oral  SpO2: 95%  Weight: 136 lb 11.2  oz (62 kg)   Physical Exam Vitals and nursing note reviewed.  Constitutional:      Appearance: She is not ill-appearing or toxic-appearing.  HENT:     Head: Normocephalic.  Eyes:     Conjunctiva/sclera: Conjunctivae normal.  Cardiovascular:     Rate and Rhythm: Normal rate and regular rhythm.     Pulses: Normal pulses.     Heart sounds: Normal heart sounds.  Pulmonary:     Effort: Pulmonary effort is normal.     Breath sounds: Normal breath sounds.  Chest:     Comments: No anterior chest wall tenderness.  No deformity or crepitus noted.  No evidence of flail chest.  No breast tenderness or palpable mass. No ecchymosis Abdominal:     General: There is no distension.  Musculoskeletal:     Cervical back: Normal range of motion.  Skin:    General: Skin is warm and dry.  Neurological:     Mental Status: She is alert.         LABORATORY DATA: I have reviewed the data as listed    Latest Ref Rng & Units 02/27/2023   10:40 AM 10/29/2022    2:25 AM 10/28/2022    3:54 AM  CBC  WBC 4.0 - 10.5 K/uL 10.1  11.0  10.6   Hemoglobin 12.0 - 15.0 g/dL 8.5  8.3  7.0   Hematocrit 36.0 - 46.0 % 26.8  27.7  23.4   Platelets 150 - 400 K/uL 270  289  255         Latest Ref Rng & Units 02/27/2023   10:40 AM 10/29/2022    2:25 AM 10/28/2022    3:54 AM  CMP  Glucose 70 - 99 mg/dL 99  161  91   BUN 8 - 23 mg/dL 30  15  19    Creatinine 0.44 - 1.00 mg/dL 0.96  0.45  4.09   Sodium 135 - 145 mmol/L 139  140  140   Potassium 3.5 - 5.1 mmol/L 4.1  4.0  3.8   Chloride 98 - 111 mmol/L 105  103  104   CO2 22 - 32 mmol/L 28  29  28    Calcium 8.9 - 10.3 mg/dL 81.1  9.0  8.6   Total Protein 6.5 - 8.1 g/dL 5.9     Total Bilirubin 0.3 - 1.2 mg/dL 0.4     Alkaline Phos 38 - 126 U/L 47     AST 15 - 41 U/L 11     ALT 0 - 44 U/L 6          RADIOGRAPHIC STUDIES (from last 24 hours if applicable) I have personally reviewed the radiological images as listed and agreed with the findings in the report. No results found.      Visit Diagnosis: 1. Normocytic anemia   2. Fall, initial encounter      Orders Placed This Encounter  Procedures   DG Ribs Bilateral W/Chest    Standing Status:   Future    Standing Expiration Date:   02/27/2024    Order Specific Question:   Reason for Exam (SYMPTOM  OR DIAGNOSIS REQUIRED)    Answer:   fall, chest pain    Order Specific Question:   Preferred imaging location?    Answer:   [facility]   Ferritin    Standing Status:   Future    Number of Occurrences:   1    Standing Expiration Date:   02/27/2024  Vitamin B12    Standing Status:   Future    Number of Occurrences:   1    Standing Expiration Date:   02/27/2024   TSH    Standing Status:   Future    Number of Occurrences:   1    Standing Expiration Date:   02/27/2024   Lactate dehydrogenase (LDH)    Standing Status:    Future    Number of Occurrences:   1    Standing Expiration Date:   02/27/2024   Retic Panel    Standing Status:   Future    Number of Occurrences:   1    Standing Expiration Date:   02/27/2024   Iron and Iron Binding Capacity ([facility] only)    Standing Status:   Future    Number of Occurrences:   1    Standing Expiration Date:   02/27/2024   SPEP with reflex to IFE    Standing Status:   Future    Number of Occurrences:   1    Standing Expiration Date:   02/27/2024    All questions were answered. The patient knows to call the clinic with any problems, questions or concerns. No barriers to learning was detected.  A total of more than 30 minutes were spent on this encounter with face-to-face time and non-face-to-face time, including preparing to see the patient, ordering tests and/or medications, counseling the patient and coordination of care as outlined above.    Thank you for allowing me to participate in the care of this patient.    Shanon Ace, PA-C Department of Hematology/Oncology [facility] Phone: [phone]  [phone]    02/27/2023 12:24 PM

## 2023-02-27 ENCOUNTER — Inpatient Hospital Stay: Payer: Medicare Other

## 2023-02-27 ENCOUNTER — Inpatient Hospital Stay: Payer: Medicare HMO | Attending: Adult Health | Admitting: Physician Assistant

## 2023-02-27 ENCOUNTER — Other Ambulatory Visit: Payer: Medicare HMO

## 2023-02-27 ENCOUNTER — Other Ambulatory Visit: Payer: Self-pay

## 2023-02-27 ENCOUNTER — Inpatient Hospital Stay: Payer: Medicare HMO | Admitting: Adult Health

## 2023-02-27 VITALS — BP 137/63 | HR 69 | Temp 98.1°F | Resp 18 | Wt 136.7 lb

## 2023-02-27 DIAGNOSIS — Z79899 Other long term (current) drug therapy: Secondary | ICD-10-CM | POA: Diagnosis not present

## 2023-02-27 DIAGNOSIS — C50212 Malignant neoplasm of upper-inner quadrant of left female breast: Secondary | ICD-10-CM | POA: Insufficient documentation

## 2023-02-27 DIAGNOSIS — Z8249 Family history of ischemic heart disease and other diseases of the circulatory system: Secondary | ICD-10-CM | POA: Insufficient documentation

## 2023-02-27 DIAGNOSIS — Z882 Allergy status to sulfonamides status: Secondary | ICD-10-CM | POA: Diagnosis not present

## 2023-02-27 DIAGNOSIS — Z8042 Family history of malignant neoplasm of prostate: Secondary | ICD-10-CM | POA: Insufficient documentation

## 2023-02-27 DIAGNOSIS — Z803 Family history of malignant neoplasm of breast: Secondary | ICD-10-CM | POA: Diagnosis not present

## 2023-02-27 DIAGNOSIS — R0602 Shortness of breath: Secondary | ICD-10-CM | POA: Insufficient documentation

## 2023-02-27 DIAGNOSIS — Z9049 Acquired absence of other specified parts of digestive tract: Secondary | ICD-10-CM | POA: Insufficient documentation

## 2023-02-27 DIAGNOSIS — Z85828 Personal history of other malignant neoplasm of skin: Secondary | ICD-10-CM | POA: Insufficient documentation

## 2023-02-27 DIAGNOSIS — Z87440 Personal history of urinary (tract) infections: Secondary | ICD-10-CM | POA: Insufficient documentation

## 2023-02-27 DIAGNOSIS — Z170 Estrogen receptor positive status [ER+]: Secondary | ICD-10-CM | POA: Insufficient documentation

## 2023-02-27 DIAGNOSIS — Z8269 Family history of other diseases of the musculoskeletal system and connective tissue: Secondary | ICD-10-CM | POA: Insufficient documentation

## 2023-02-27 DIAGNOSIS — Z8711 Personal history of peptic ulcer disease: Secondary | ICD-10-CM | POA: Insufficient documentation

## 2023-02-27 DIAGNOSIS — W19XXXA Unspecified fall, initial encounter: Secondary | ICD-10-CM | POA: Diagnosis not present

## 2023-02-27 DIAGNOSIS — Z823 Family history of stroke: Secondary | ICD-10-CM | POA: Insufficient documentation

## 2023-02-27 DIAGNOSIS — Z87891 Personal history of nicotine dependence: Secondary | ICD-10-CM | POA: Diagnosis not present

## 2023-02-27 DIAGNOSIS — D649 Anemia, unspecified: Secondary | ICD-10-CM

## 2023-02-27 DIAGNOSIS — I10 Essential (primary) hypertension: Secondary | ICD-10-CM | POA: Insufficient documentation

## 2023-02-27 DIAGNOSIS — R079 Chest pain, unspecified: Secondary | ICD-10-CM | POA: Diagnosis not present

## 2023-02-27 DIAGNOSIS — Z79811 Long term (current) use of aromatase inhibitors: Secondary | ICD-10-CM | POA: Diagnosis not present

## 2023-02-27 DIAGNOSIS — Z7901 Long term (current) use of anticoagulants: Secondary | ICD-10-CM | POA: Insufficient documentation

## 2023-02-27 DIAGNOSIS — R059 Cough, unspecified: Secondary | ICD-10-CM | POA: Insufficient documentation

## 2023-02-27 DIAGNOSIS — I4821 Permanent atrial fibrillation: Secondary | ICD-10-CM | POA: Diagnosis not present

## 2023-02-27 DIAGNOSIS — Z8261 Family history of arthritis: Secondary | ICD-10-CM | POA: Diagnosis not present

## 2023-02-27 DIAGNOSIS — Z9181 History of falling: Secondary | ICD-10-CM | POA: Insufficient documentation

## 2023-02-27 LAB — CBC WITH DIFFERENTIAL (CANCER CENTER ONLY)
Abs Immature Granulocytes: 0.03 10*3/uL (ref 0.00–0.07)
Basophils Absolute: 0.1 10*3/uL (ref 0.0–0.1)
Basophils Relative: 1 %
Eosinophils Absolute: 0.2 10*3/uL (ref 0.0–0.5)
Eosinophils Relative: 2 %
HCT: 26.8 % — ABNORMAL LOW (ref 36.0–46.0)
Hemoglobin: 8.5 g/dL — ABNORMAL LOW (ref 12.0–15.0)
Immature Granulocytes: 0 %
Lymphocytes Relative: 21 %
Lymphs Abs: 2.2 10*3/uL (ref 0.7–4.0)
MCH: 30.6 pg (ref 26.0–34.0)
MCHC: 31.7 g/dL (ref 30.0–36.0)
MCV: 96.4 fL (ref 80.0–100.0)
Monocytes Absolute: 0.9 10*3/uL (ref 0.1–1.0)
Monocytes Relative: 8 %
Neutro Abs: 6.8 10*3/uL (ref 1.7–7.7)
Neutrophils Relative %: 68 %
Platelet Count: 270 10*3/uL (ref 150–400)
RBC: 2.78 MIL/uL — ABNORMAL LOW (ref 3.87–5.11)
RDW: 14.3 % (ref 11.5–15.5)
WBC Count: 10.1 10*3/uL (ref 4.0–10.5)
nRBC: 0 % (ref 0.0–0.2)

## 2023-02-27 LAB — CMP (CANCER CENTER ONLY)
ALT: 6 U/L (ref 0–44)
AST: 11 U/L — ABNORMAL LOW (ref 15–41)
Albumin: 3.7 g/dL (ref 3.5–5.0)
Alkaline Phosphatase: 47 U/L (ref 38–126)
Anion gap: 6 (ref 5–15)
BUN: 30 mg/dL — ABNORMAL HIGH (ref 8–23)
CO2: 28 mmol/L (ref 22–32)
Calcium: 10.1 mg/dL (ref 8.9–10.3)
Chloride: 105 mmol/L (ref 98–111)
Creatinine: 1.08 mg/dL — ABNORMAL HIGH (ref 0.44–1.00)
GFR, Estimated: 50 mL/min — ABNORMAL LOW (ref >60)
Glucose, Bld: 99 mg/dL (ref 70–99)
Potassium: 4.1 mmol/L (ref 3.5–5.1)
Sodium: 139 mmol/L (ref 135–145)
Total Bilirubin: 0.4 mg/dL (ref 0.3–1.2)
Total Protein: 5.9 g/dL — ABNORMAL LOW (ref 6.5–8.1)

## 2023-02-27 LAB — TSH: TSH: 0.631 u[IU]/mL (ref 0.350–4.500)

## 2023-02-27 LAB — SAMPLE TO BLOOD BANK

## 2023-02-27 LAB — IRON AND IRON BINDING CAPACITY (CC-WL,HP ONLY)
Iron: 110 ug/dL (ref 28–170)
Saturation Ratios: 38 % — ABNORMAL HIGH (ref 10.4–31.8)
TIBC: 287 ug/dL (ref 250–450)
UIBC: 177 ug/dL (ref 148–442)

## 2023-02-27 LAB — RETIC PANEL
Immature Retic Fract: 29.6 % — ABNORMAL HIGH (ref 2.3–15.9)
RBC.: 2.76 MIL/uL — ABNORMAL LOW (ref 3.87–5.11)
Retic Count, Absolute: 156.8 10*3/uL (ref 19.0–186.0)
Retic Ct Pct: 5.7 % — ABNORMAL HIGH (ref 0.4–3.1)
Reticulocyte Hemoglobin: 27.3 pg — ABNORMAL LOW (ref >27.9)

## 2023-02-27 LAB — VITAMIN B12: Vitamin B-12: 154 pg/mL — ABNORMAL LOW (ref 180–914)

## 2023-02-27 LAB — FERRITIN: Ferritin: 24 ng/mL (ref 11–307)

## 2023-02-27 LAB — LACTATE DEHYDROGENASE: LDH: 131 U/L (ref 98–192)

## 2023-03-02 ENCOUNTER — Encounter: Payer: Self-pay | Admitting: Family Medicine

## 2023-03-05 ENCOUNTER — Other Ambulatory Visit: Payer: Self-pay

## 2023-03-05 ENCOUNTER — Encounter: Payer: Self-pay | Admitting: Adult Health

## 2023-03-05 ENCOUNTER — Other Ambulatory Visit: Payer: Self-pay | Admitting: Adult Health

## 2023-03-05 ENCOUNTER — Inpatient Hospital Stay: Payer: Medicare HMO

## 2023-03-05 ENCOUNTER — Inpatient Hospital Stay: Payer: Medicare HMO | Attending: Adult Health | Admitting: Adult Health

## 2023-03-05 VITALS — BP 122/44 | HR 79 | Temp 98.1°F | Resp 18 | Ht 65.0 in | Wt 136.6 lb

## 2023-03-05 DIAGNOSIS — Z170 Estrogen receptor positive status [ER+]: Secondary | ICD-10-CM | POA: Diagnosis not present

## 2023-03-05 DIAGNOSIS — Z85828 Personal history of other malignant neoplasm of skin: Secondary | ICD-10-CM | POA: Insufficient documentation

## 2023-03-05 DIAGNOSIS — Z9049 Acquired absence of other specified parts of digestive tract: Secondary | ICD-10-CM | POA: Insufficient documentation

## 2023-03-05 DIAGNOSIS — Z803 Family history of malignant neoplasm of breast: Secondary | ICD-10-CM | POA: Diagnosis not present

## 2023-03-05 DIAGNOSIS — Z823 Family history of stroke: Secondary | ICD-10-CM | POA: Insufficient documentation

## 2023-03-05 DIAGNOSIS — E538 Deficiency of other specified B group vitamins: Secondary | ICD-10-CM | POA: Diagnosis not present

## 2023-03-05 DIAGNOSIS — I110 Hypertensive heart disease with heart failure: Secondary | ICD-10-CM | POA: Diagnosis not present

## 2023-03-05 DIAGNOSIS — Z8042 Family history of malignant neoplasm of prostate: Secondary | ICD-10-CM | POA: Insufficient documentation

## 2023-03-05 DIAGNOSIS — Z7901 Long term (current) use of anticoagulants: Secondary | ICD-10-CM | POA: Insufficient documentation

## 2023-03-05 DIAGNOSIS — D649 Anemia, unspecified: Secondary | ICD-10-CM | POA: Diagnosis not present

## 2023-03-05 DIAGNOSIS — C50212 Malignant neoplasm of upper-inner quadrant of left female breast: Secondary | ICD-10-CM | POA: Diagnosis present

## 2023-03-05 DIAGNOSIS — Z8711 Personal history of peptic ulcer disease: Secondary | ICD-10-CM | POA: Insufficient documentation

## 2023-03-05 DIAGNOSIS — Z79899 Other long term (current) drug therapy: Secondary | ICD-10-CM | POA: Diagnosis not present

## 2023-03-05 DIAGNOSIS — E785 Hyperlipidemia, unspecified: Secondary | ICD-10-CM | POA: Insufficient documentation

## 2023-03-05 DIAGNOSIS — Z87891 Personal history of nicotine dependence: Secondary | ICD-10-CM | POA: Insufficient documentation

## 2023-03-05 DIAGNOSIS — Z8261 Family history of arthritis: Secondary | ICD-10-CM | POA: Diagnosis not present

## 2023-03-05 DIAGNOSIS — Z8249 Family history of ischemic heart disease and other diseases of the circulatory system: Secondary | ICD-10-CM | POA: Diagnosis not present

## 2023-03-05 DIAGNOSIS — Z79811 Long term (current) use of aromatase inhibitors: Secondary | ICD-10-CM | POA: Insufficient documentation

## 2023-03-05 DIAGNOSIS — Z87440 Personal history of urinary (tract) infections: Secondary | ICD-10-CM | POA: Diagnosis not present

## 2023-03-05 DIAGNOSIS — I4821 Permanent atrial fibrillation: Secondary | ICD-10-CM | POA: Diagnosis not present

## 2023-03-05 DIAGNOSIS — Z8269 Family history of other diseases of the musculoskeletal system and connective tissue: Secondary | ICD-10-CM | POA: Insufficient documentation

## 2023-03-05 NOTE — Patient Instructions (Signed)
Vitamin B12 Deficiency Vitamin B12 deficiency occurs when the body does not have enough of this important vitamin. The body needs this vitamin: To make red blood cells. To make DNA. This is the genetic material inside cells. To help the nerves work properly so they can carry messages from the brain to the body. Vitamin B12 deficiency can cause health problems, such as not having enough red blood cells in the blood (anemia). This can lead to nerve damage if untreated. What are the causes? This condition may be caused by: Not eating enough foods that contain vitamin B12. Not having enough stomach acid and digestive fluids to properly absorb vitamin B12 from the food that you eat. Having certain diseases that make it hard to absorb vitamin B12. These diseases include Crohn's disease, chronic pancreatitis, and cystic fibrosis. An autoimmune disorder in which the body does not make enough of a protein (intrinsic factor) within the stomach, resulting in not enough absorption of vitamin B12. Having a surgery in which part of the stomach or small intestine is removed. Taking certain medicines that make it hard for the body to absorb vitamin B12. These include: Heartburn medicines, such as antacids and proton pump inhibitors. Some medicines that are used to treat diabetes. What increases the risk? The following factors may make you more likely to develop a vitamin B12 deficiency: Being an older adult. Eating a vegetarian or vegan diet that does not include any foods that come from animals. Eating a poor diet while you are pregnant. Taking certain medicines. Having alcoholism. What are the signs or symptoms? In some cases, there are no symptoms of this condition. If the condition leads to anemia or nerve damage, various symptoms may occur, such as: Weakness. Tiredness (fatigue). Loss of appetite. Numbness or tingling in your hands and feet. Redness and burning of the tongue. Depression,  confusion, or memory problems. Trouble walking. If anemia is severe, symptoms can include: Shortness of breath. Dizziness. Rapid heart rate. How is this diagnosed? This condition may be diagnosed with a blood test to measure the level of vitamin B12 in your blood. You may also have other tests, including: A group of tests that measure certain characteristics of blood cells (complete blood count, CBC). A blood test to measure intrinsic factor. A procedure where a thin tube with a camera on the end is used to look into your stomach or intestines (endoscopy). Other tests may be needed to discover the cause of the deficiency. How is this treated? Treatment for this condition depends on the cause. This condition may be treated by: Changing your eating and drinking habits, such as: Eating more foods that contain vitamin B12. Drinking less alcohol or no alcohol. Getting vitamin B12 injections. Taking vitamin B12 supplements by mouth (orally). Your health care provider will tell you which dose is best for you. Follow these instructions at home: Eating and drinking  Include foods in your diet that come from animals and contain a lot of vitamin B12. These include: Meats and poultry. This includes beef, pork, chicken, turkey, and organ meats, such as liver. Seafood. This includes clams, rainbow trout, salmon, tuna, and haddock. Eggs. Dairy foods such as milk, yogurt, and cheese. Eat foods that have vitamin B12 added to them (are fortified), such as ready-to-eat breakfast cereals. Check the label on the package to see if a food is fortified. The items listed above may not be a complete list of foods and beverages you can eat and drink. Contact a dietitian for   more information. Alcohol use Do not drink alcohol if: Your health care provider tells you not to drink. You are pregnant, may be pregnant, or are planning to become pregnant. If you drink alcohol: Limit how much you have to: 0-1 drink a  day for women. 0-2 drinks a day for men. Know how much alcohol is in your drink. In the [place] one drink equals one 12 oz bottle of beer (355 mL), one 5 oz glass of wine (148 mL), or one 1 oz glass of hard liquor (44 mL). General instructions Get vitamin B12 injections if told to by your health care provider. Take supplements only as told by your health care provider. Follow the directions carefully. Keep all follow-up visits. This is important. Contact a health care provider if: Your symptoms come back. Your symptoms get worse or do not improve with treatment. Get help right away: You develop shortness of breath. You have a rapid heart rate. You have chest pain. You become dizzy or you faint. These symptoms may be an emergency. Get help right away. Call 911. Do not wait to see if the symptoms will go away. Do not drive yourself to the hospital. Summary Vitamin B12 deficiency occurs when the body does not have enough of this important vitamin. Common causes include not eating enough foods that contain vitamin B12, not being able to absorb vitamin B12 from the food that you eat, having a surgery in which part of the stomach or small intestine is removed, or taking certain medicines. Eat foods that have vitamin B12 in them. Treatment may include making a change in the way you eat and drink, getting vitamin B12 injections, or taking vitamin B12 supplements. This information is not intended to replace advice given to you by your health care provider. Make sure you discuss any questions you have with your health care provider. Document Revised: 03/15/2021 Document Reviewed: 03/15/2021 Elsevier Patient Education  2024 [employer]  

## 2023-03-05 NOTE — Progress Notes (Signed)
[facility] Cancer Follow up:    Mallory Palmer, MD [address] [place] [address]   DIAGNOSIS:  Cancer Staging  Malignant neoplasm of upper-inner quadrant of left breast in female, estrogen receptor positive (HCC) Staging form: Breast, AJCC 8th Edition - Clinical stage from 08/23/2020: Stage IA (cT1c, cN0, cM0, G2, ER+, PR+, HER2-) - Signed by Lowella Dell, MD on 08/29/2020 Stage prefix: Initial diagnosis Method of lymph node assessment: Clinical   SUMMARY OF ONCOLOGIC HISTORY: Oncology History  Malignant neoplasm of upper-inner quadrant of left breast in female, estrogen receptor positive (HCC)  07/24/2020 Initial Diagnosis   07/24/2020: Left breast UIQ T1CN0 stage Ia grade 2 IDC ER/PR positive HER2 negative Ki-67 10%    08/23/2020 Cancer Staging   Staging form: Breast, AJCC 8th Edition - Clinical stage from 08/23/2020: Stage IA (cT1c, cN0, cM0, G2, ER+, PR+, HER2-) - Signed by Lowella Dell, MD on 08/29/2020   09/06/2020 Surgery   Left lumpectomy: T1c, Nx grade 2 IDC, negative margins, declined radiation   10/2020 -  Anti-estrogen oral therapy   Anastrozole   11/26/2020 Genetic Testing   Negative genetic testing:  No pathogenic variants detected on the [employer] CancerNext-Expanded + RNAinsight panel. A variant of uncertain significance (VUS) was detected in the MSH6 gene called [id number] (c.3930G>C). The report date is 11/26/2020.  The CancerNext-Expanded + RNAinsight gene panel offered by [employer] and includes sequencing and rearrangement analysis for the following 77 genes: AIP, ALK, APC, ATM, AXIN2, BAP1, BARD1, BLM, BMPR1A, BRCA1, BRCA2, BRIP1, CDC73, CDH1, CDK4, CDKN1B, CDKN2A, CHEK2, CTNNA1, DICER1, FANCC, FH, FLCN, GALNT12, KIF1B, LZTR1, MAX, MEN1, MET, MLH1, MSH2, MSH3, MSH6, MUTYH, NBN, NF1, NF2, NTHL1, PALB2, PHOX2B, PMS2, POT1, PRKAR1A, PTCH1, PTEN, RAD51C, RAD51D, RB1, RECQL, RET, SDHA, SDHAF2, SDHB, SDHC, SDHD, SMAD4,  SMARCA4, SMARCB1, SMARCE1, STK11, SUFU, TMEM127, TP53, TSC1, TSC2, VHL and XRCC2 (sequencing and deletion/duplication); EGFR, EGLN1, HOXB13, KIT, MITF, PDGFRA, POLD1 and POLE (sequencing only); EPCAM and GREM1 (deletion/duplication only). RNA data is routinely analyzed for use in variant interpretation for all genes.      CURRENT THERAPY: Anastrozole  INTERVAL HISTORY: Mallory Stephens 89 y.o. female returns for follow up of her normocytic anemia.  She continues on anastrozole for her history of breast cancer with good tolerance.    She was evaluated by Jae Dire in symptom management 02/27/2023.  She had a hemoglobin of 8.5 at that time and was feeling poorly.  Mallory Stephens underwent additional testing with negative SPEP, normal iron studies, however her b12 level was decreased.  Patient Active Problem List   Diagnosis Date Noted   Breast cancer (HCC) 10/25/2022   Permanent atrial fibrillation (HCC) 10/25/2022   Essential hypertension 10/25/2022   Hyperlipidemia 10/25/2022   Hypokalemia 10/25/2022   Normocytic anemia 10/25/2022   Acute respiratory failure with hypoxia secondary to CAP vs/+ new CHF exacerbation 10/25/2022   CHF exacerbation (HCC) 10/25/2022   CAP (community acquired pneumonia) 10/25/2022   Genetic testing 11/26/2020   History of basal cell carcinoma    Family history of breast cancer    Family history of prostate cancer    Malignant neoplasm of upper-inner quadrant of left breast in female, 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/2020   Hypokalemia 04/29/2017   Leukocytosis 04/29/2017   Syncope 04/29/2017   UTI (urinary tract infection) 04/28/2017   Fall 04/28/2017   Nausea and vomiting 04/28/2017   Generalized weakness 04/28/2017   Constipation 04/28/2017   Diverticulitis    Edema extremities  09/03/2016   Dyslipidemia    Permanent atrial fibrillation (HCC) 07/08/2013   Essential hypertension, benign 07/08/2013   Long term (current) use of anticoagulants 07/08/2013    Encounter for long-term (current) use of other medications 07/08/2013    is allergic to sulfa antibiotics and sulfa drugs cross reactors.  MEDICAL HISTORY: Past Medical History:  Diagnosis Date   Breast cancer (HCC) 07/24/2020   Dyslipidemia    Dysrhythmia    atrial fibrillation   Edema extremities 09/03/2016   Family history of breast cancer    Family history of prostate cancer    Fatty liver    GERD (gastroesophageal reflux disease)    History of basal cell carcinoma    Hypertension    Lymphocytosis    followed by Dr. Arbutus Ped   Multinodular goiter    Osteoarthritis    Permanent atrial fibrillation (HCC)    atrial fibrillation s/p DCCV 3/10 and 6/10   PONV (postoperative nausea and vomiting)    PUD (peptic ulcer disease)    Pulmonary nodules    followed  by Dr. Paulino Rily   UTI (urinary tract infection) 04/2017    SURGICAL HISTORY: Past Surgical History:  Procedure Laterality Date   aspiration of cyst  1978   BREAST LUMPECTOMY WITH RADIOACTIVE SEED LOCALIZATION Left 09/06/2020   Procedure: LEFT BREAST LUMPECTOMY WITH RADIOACTIVE SEED LOCALIZATION;  Surgeon: Almond Lint, MD;  Location: [facility] OR;  Service: General;  Laterality: Left;   BREAST SURGERY  1962   right breast for benign lesion   BREAST SURGERY  1971   bilateral lumpectomy for beingn lesions   CARDIOVERSION  07/22/2011   Procedure: CARDIOVERSION;  Surgeon: Quintella Reichert, MD;  Location: [facility] OR;  Service: Cardiovascular;  Laterality: N/A;   CARDIOVERSION  09/02/2011   Procedure: CARDIOVERSION;  Surgeon: Quintella Reichert, MD;  Location: [facility] OR;  Service: Cardiovascular;  Laterality: N/A;   cataract     cataract removal-bilateral   CHOLECYSTECTOMY  2003   CYSTOSCOPY     DILATION AND CURETTAGE OF UTERUS  1979   JOINT REPLACEMENT  2002   Right knee   JOINT REPLACEMENT  2007   left knee    SOCIAL HISTORY: Social History   Socioeconomic History   Marital status: Widowed    Spouse name: Not on file   Number of  children: Not on file   Years of education: Not on file   Highest education level: Not on file  Occupational History   Not on file  Tobacco Use   Smoking status: Former    Current packs/day: 1.00    Average packs/day: 1 pack/day for 30.0 years (30.0 ttl pk-yrs)    Types: Cigarettes   Smokeless tobacco: Never  Vaping Use   Vaping status: Never Used  Substance and Sexual Activity   Alcohol use: Yes    Comment: occasional wine   Drug use: No   Sexual activity: Not on file  Other Topics Concern   Not on file  Social History Narrative   ** Merged History Encounter **       Social Determinants of Health   Financial Resource Strain: Not on file  Food Insecurity: No Food Insecurity (10/25/2022)   Hunger Vital Sign    Worried About Running Out of Food in the Last Year: Never true    Ran Out of Food in the Last Year: Never true  Transportation Needs: No Transportation Needs (10/25/2022)   PRAPARE - Administrator, Civil Service (Medical): No  Lack of Transportation (Non-Medical): No  Physical Activity: Not on file  Stress: Not on file  Social Connections: Not on file  Intimate Partner Violence: Not At Risk (10/25/2022)   Humiliation, Afraid, Rape, and Kick questionnaire    Fear of Current or Ex-Partner: No    Emotionally Abused: No    Physically Abused: No    Sexually Abused: No    FAMILY HISTORY: Family History  Problem Relation Age of Onset   Stroke Mother    Stroke Father    Breast cancer Sister        dx 21s   Arthritis Sister    Scoliosis Sister    Heart disease Brother    Prostate cancer Brother        dx >50, metastatic   Prostate cancer Brother        dx >50, metastatic   Prostate cancer Nephew        dx <50   Breast cancer Niece        dx <50   Breast cancer Niece        dx <50   Autoimmune disease Neg Hx     Review of Systems  Constitutional:  Positive for fatigue. Negative for appetite change, chills, fever and unexpected weight change.   HENT:   Negative for hearing loss, lump/mass and trouble swallowing.   Eyes:  Negative for eye problems and icterus.  Respiratory:  Negative for chest tightness, cough and shortness of breath.   Cardiovascular:  Negative for chest pain, leg swelling and palpitations.  Gastrointestinal:  Negative for abdominal distention, abdominal pain, constipation, diarrhea, nausea and vomiting.  Endocrine: Negative for hot flashes.  Genitourinary:  Negative for difficulty urinating.   Musculoskeletal:  Negative for arthralgias.  Skin:  Negative for itching and rash.  Neurological:  Negative for dizziness, extremity weakness, headaches and numbness.  Hematological:  Negative for adenopathy. Does not bruise/bleed easily.  Psychiatric/Behavioral:  Negative for depression. The patient is not nervous/anxious.       PHYSICAL EXAMINATION    Vitals:   03/05/23 1536  BP: (!) 122/44  Pulse: 79  Resp: 18  Temp: 98.1 F (36.7 C)  SpO2: 97%    Physical Exam Constitutional:      General: She is not in acute distress.    Appearance: Normal appearance. She is not toxic-appearing.  HENT:     Head: Normocephalic and atraumatic.     Mouth/Throat:     Mouth: Mucous membranes are moist.     Pharynx: Oropharynx is clear. No oropharyngeal exudate or posterior oropharyngeal erythema.  Eyes:     General: No scleral icterus. Cardiovascular:     Rate and Rhythm: Normal rate and regular rhythm.     Pulses: Normal pulses.     Heart sounds: Normal heart sounds.  Pulmonary:     Effort: Pulmonary effort is normal.     Breath sounds: Normal breath sounds.  Abdominal:     General: Abdomen is flat. Bowel sounds are normal. There is no distension.     Palpations: Abdomen is soft.     Tenderness: There is no abdominal tenderness.  Musculoskeletal:        General: No swelling.     Cervical back: Neck supple.  Lymphadenopathy:     Cervical: No cervical adenopathy.  Skin:    General: Skin is warm and dry.      Findings: No rash.  Neurological:     General: No focal deficit present.  Mental Status: She is alert.  Psychiatric:        Mood and Affect: Mood normal.        Behavior: Behavior normal.     LABORATORY DATA:  CBC    Component Value Date/Time   WBC 10.1 02/27/2023 1040   WBC 11.0 (H) 10/29/2022 0225   RBC 2.76 (L) 02/27/2023 1140   RBC 2.78 (L) 02/27/2023 1040   HGB 8.5 (L) 02/27/2023 1040   HGB 13.3 09/17/2021 1024   HGB 13.5 02/22/2009 1035   HCT 26.8 (L) 02/27/2023 1040   HCT 40.3 09/17/2021 1024   HCT 38.5 02/22/2009 1035   PLT 270 02/27/2023 1040   PLT 247 09/17/2021 1024   MCV 96.4 02/27/2023 1040   MCV 93 09/17/2021 1024   MCV 91.2 02/22/2009 1035   MCH 30.6 02/27/2023 1040   MCHC 31.7 02/27/2023 1040   RDW 14.3 02/27/2023 1040   RDW 12.6 09/17/2021 1024   RDW 12.9 02/22/2009 1035   LYMPHSABS 2.2 02/27/2023 1040   LYMPHSABS 2.9 02/22/2009 1035   MONOABS 0.9 02/27/2023 1040   MONOABS 0.6 02/22/2009 1035   EOSABS 0.2 02/27/2023 1040   EOSABS 0.5 02/22/2009 1035   BASOSABS 0.1 02/27/2023 1040   BASOSABS 0.1 02/22/2009 1035    CMP     Component Value Date/Time   NA 139 02/27/2023 1040   NA 142 09/17/2021 1024   K 4.1 02/27/2023 1040   CL 105 02/27/2023 1040   CO2 28 02/27/2023 1040   GLUCOSE 99 02/27/2023 1040   BUN 30 (H) 02/27/2023 1040   BUN 20 09/17/2021 1024   CREATININE 1.08 (H) 02/27/2023 1040   CREATININE 1.32 (H) 08/09/2015 1122   CALCIUM 10.1 02/27/2023 1040   PROT 5.9 (L) 02/27/2023 1040   PROT 6.5 06/01/2019 0958   ALBUMIN 3.7 02/27/2023 1040   ALBUMIN 4.2 06/01/2019 0958   AST 11 (L) 02/27/2023 1040   ALT 6 02/27/2023 1040   ALKPHOS 47 02/27/2023 1040   BILITOT 0.4 02/27/2023 1040   GFRNONAA 50 (L) 02/27/2023 1040   GFRAA 44 (L) 02/22/2020 1041      ASSESSMENT and THERAPY PLAN:   Malignant neoplasm of upper-inner quadrant of left breast in female, estrogen receptor positive (HCC) Mallory Stephens is an 89 year old woman with history  of stage IA ER/PR positive breast cancer s/p left lumpectomy and adjuvant antiestrogen therapy with Anastrozole.    Stage IA ER/PR positive breast cancer: no signs of recurrence.  She continues on Anastrozole daily.  Normocytic anemia: She is taking oral iron.  She has a b12 deficiency.  I recommended that she undergo lab testing of antiparietal antibody and intrinsic factor antibodies to evaluate whether Tereka has an inability to absorb b12.  If those are negative, then I recommend she take Vitamin b12 1000 mcg sublingual daily.   RTC in 12 weeks for labs. She has f/u with Dr. Pamelia Hoit in 06/2023.   All questions were answered. The patient knows to call the clinic with any problems, questions or concerns. We can certainly see the patient much sooner if necessary.  Total encounter time:30 minutes*in face-to-face visit time, chart review, lab review, care coordination, order entry, and documentation of the encounter time.  Lillard Anes, NP 03/11/23 7:47 PM Medical Oncology and Hematology [facility] [address] [place], [place] [address] Tel. [phone]    Fax. [phone]  *Total Encounter Time as defined by the Centers for Medicare and Medicaid Services includes, in addition to the  face-to-face time of a patient visit (documented in the note above) non-face-to-face time: obtaining and reviewing outside history, ordering and reviewing medications, tests or procedures, care coordination (communications with other health care professionals or caregivers) and documentation in the medical record.

## 2023-03-09 ENCOUNTER — Telehealth: Payer: Self-pay | Admitting: Adult Health

## 2023-03-09 NOTE — Telephone Encounter (Signed)
Per Lindsey Causey,DNP, called pt with message below. Pt verbalized understanding 

## 2023-03-09 NOTE — Telephone Encounter (Signed)
-----   Message from Noreene Filbert sent at 03/06/2023  5:14 PM EDT ----- Labs do not demonstrate that she cannot absorb oral b12.  Please tell her that she can take b12 sublingual daily as we discussed during her appointment ----- Message ----- From: Interface, Lab In [place] Sent: 03/06/2023   2:36 PM EDT To: Loa Socks, NP

## 2023-03-11 NOTE — Assessment & Plan Note (Addendum)
Mallory Stephens is an 89 year old woman with history of stage IA ER/PR positive breast cancer s/p left lumpectomy and adjuvant antiestrogen therapy with Anastrozole.    Stage IA ER/PR positive breast cancer: no signs of recurrence.  She continues on Anastrozole daily.  Normocytic anemia: She is taking oral iron.  She has a b12 deficiency.  I recommended that she undergo lab testing of antiparietal antibody and intrinsic factor antibodies to evaluate whether Batina has an inability to absorb b12.  If those are negative, then I recommend she take Vitamin b12 1000 mcg sublingual daily.   RTC in 12 weeks for labs. She has f/u with Dr. Pamelia Hoit in 06/2023.

## 2023-03-16 ENCOUNTER — Telehealth: Payer: Self-pay | Admitting: Adult Health

## 2023-03-16 NOTE — Telephone Encounter (Signed)
Scheduled appointments per 8/1 los. Left voicemail.

## 2023-05-26 ENCOUNTER — Other Ambulatory Visit: Payer: Self-pay | Admitting: *Deleted

## 2023-05-26 DIAGNOSIS — D649 Anemia, unspecified: Secondary | ICD-10-CM

## 2023-05-26 DIAGNOSIS — Z170 Estrogen receptor positive status [ER+]: Secondary | ICD-10-CM

## 2023-05-28 ENCOUNTER — Inpatient Hospital Stay: Payer: Medicare HMO | Admitting: Adult Health

## 2023-05-28 ENCOUNTER — Inpatient Hospital Stay: Payer: Medicare HMO | Attending: Adult Health

## 2023-05-28 ENCOUNTER — Other Ambulatory Visit: Payer: Self-pay

## 2023-05-28 ENCOUNTER — Encounter: Payer: Self-pay | Admitting: Adult Health

## 2023-05-28 VITALS — BP 120/55 | HR 63 | Temp 97.6°F | Resp 18 | Ht 65.0 in | Wt 134.1 lb

## 2023-05-28 DIAGNOSIS — D631 Anemia in chronic kidney disease: Secondary | ICD-10-CM | POA: Insufficient documentation

## 2023-05-28 DIAGNOSIS — E538 Deficiency of other specified B group vitamins: Secondary | ICD-10-CM

## 2023-05-28 DIAGNOSIS — Z85828 Personal history of other malignant neoplasm of skin: Secondary | ICD-10-CM | POA: Insufficient documentation

## 2023-05-28 DIAGNOSIS — Z79899 Other long term (current) drug therapy: Secondary | ICD-10-CM | POA: Diagnosis not present

## 2023-05-28 DIAGNOSIS — Z7901 Long term (current) use of anticoagulants: Secondary | ICD-10-CM | POA: Insufficient documentation

## 2023-05-28 DIAGNOSIS — Z87440 Personal history of urinary (tract) infections: Secondary | ICD-10-CM | POA: Diagnosis not present

## 2023-05-28 DIAGNOSIS — E785 Hyperlipidemia, unspecified: Secondary | ICD-10-CM | POA: Diagnosis not present

## 2023-05-28 DIAGNOSIS — N189 Chronic kidney disease, unspecified: Secondary | ICD-10-CM | POA: Diagnosis not present

## 2023-05-28 DIAGNOSIS — Z87891 Personal history of nicotine dependence: Secondary | ICD-10-CM | POA: Insufficient documentation

## 2023-05-28 DIAGNOSIS — C50212 Malignant neoplasm of upper-inner quadrant of left female breast: Secondary | ICD-10-CM

## 2023-05-28 DIAGNOSIS — M2550 Pain in unspecified joint: Secondary | ICD-10-CM | POA: Diagnosis not present

## 2023-05-28 DIAGNOSIS — D649 Anemia, unspecified: Secondary | ICD-10-CM

## 2023-05-28 DIAGNOSIS — M1990 Unspecified osteoarthritis, unspecified site: Secondary | ICD-10-CM | POA: Insufficient documentation

## 2023-05-28 DIAGNOSIS — Z170 Estrogen receptor positive status [ER+]: Secondary | ICD-10-CM

## 2023-05-28 DIAGNOSIS — I129 Hypertensive chronic kidney disease with stage 1 through stage 4 chronic kidney disease, or unspecified chronic kidney disease: Secondary | ICD-10-CM | POA: Diagnosis not present

## 2023-05-28 DIAGNOSIS — Z823 Family history of stroke: Secondary | ICD-10-CM | POA: Insufficient documentation

## 2023-05-28 DIAGNOSIS — R5383 Other fatigue: Secondary | ICD-10-CM | POA: Insufficient documentation

## 2023-05-28 DIAGNOSIS — Z1721 Progesterone receptor positive status: Secondary | ICD-10-CM | POA: Diagnosis not present

## 2023-05-28 DIAGNOSIS — Z9049 Acquired absence of other specified parts of digestive tract: Secondary | ICD-10-CM | POA: Diagnosis not present

## 2023-05-28 DIAGNOSIS — I4821 Permanent atrial fibrillation: Secondary | ICD-10-CM | POA: Diagnosis not present

## 2023-05-28 DIAGNOSIS — R531 Weakness: Secondary | ICD-10-CM | POA: Insufficient documentation

## 2023-05-28 DIAGNOSIS — Z803 Family history of malignant neoplasm of breast: Secondary | ICD-10-CM | POA: Diagnosis not present

## 2023-05-28 DIAGNOSIS — Z1732 Human epidermal growth factor receptor 2 negative status: Secondary | ICD-10-CM | POA: Insufficient documentation

## 2023-05-28 DIAGNOSIS — Z8261 Family history of arthritis: Secondary | ICD-10-CM | POA: Insufficient documentation

## 2023-05-28 DIAGNOSIS — Z8711 Personal history of peptic ulcer disease: Secondary | ICD-10-CM | POA: Insufficient documentation

## 2023-05-28 DIAGNOSIS — Z8249 Family history of ischemic heart disease and other diseases of the circulatory system: Secondary | ICD-10-CM | POA: Insufficient documentation

## 2023-05-28 DIAGNOSIS — Z8042 Family history of malignant neoplasm of prostate: Secondary | ICD-10-CM | POA: Insufficient documentation

## 2023-05-28 LAB — CBC WITH DIFFERENTIAL (CANCER CENTER ONLY)
Abs Immature Granulocytes: 0.04 10*3/uL (ref 0.00–0.07)
Basophils Absolute: 0.1 10*3/uL (ref 0.0–0.1)
Basophils Relative: 1 %
Eosinophils Absolute: 0.2 10*3/uL (ref 0.0–0.5)
Eosinophils Relative: 2 %
HCT: 27.8 % — ABNORMAL LOW (ref 36.0–46.0)
Hemoglobin: 8.4 g/dL — ABNORMAL LOW (ref 12.0–15.0)
Immature Granulocytes: 0 %
Lymphocytes Relative: 26 %
Lymphs Abs: 3.1 10*3/uL (ref 0.7–4.0)
MCH: 28.6 pg (ref 26.0–34.0)
MCHC: 30.2 g/dL (ref 30.0–36.0)
MCV: 94.6 fL (ref 80.0–100.0)
Monocytes Absolute: 0.9 10*3/uL (ref 0.1–1.0)
Monocytes Relative: 7 %
Neutro Abs: 7.6 10*3/uL (ref 1.7–7.7)
Neutrophils Relative %: 64 %
Platelet Count: 316 10*3/uL (ref 150–400)
RBC: 2.94 MIL/uL — ABNORMAL LOW (ref 3.87–5.11)
RDW: 13.8 % (ref 11.5–15.5)
WBC Count: 11.9 10*3/uL — ABNORMAL HIGH (ref 4.0–10.5)
nRBC: 0 % (ref 0.0–0.2)

## 2023-05-28 LAB — IRON AND IRON BINDING CAPACITY (CC-WL,HP ONLY)
Iron: 89 ug/dL (ref 28–170)
Saturation Ratios: 28 % (ref 10.4–31.8)
TIBC: 314 ug/dL (ref 250–450)
UIBC: 225 ug/dL (ref 148–442)

## 2023-05-28 LAB — CMP (CANCER CENTER ONLY)
ALT: 6 U/L (ref 0–44)
AST: 10 U/L — ABNORMAL LOW (ref 15–41)
Albumin: 3.7 g/dL (ref 3.5–5.0)
Alkaline Phosphatase: 43 U/L (ref 38–126)
Anion gap: 6 (ref 5–15)
BUN: 26 mg/dL — ABNORMAL HIGH (ref 8–23)
CO2: 29 mmol/L (ref 22–32)
Calcium: 9.9 mg/dL (ref 8.9–10.3)
Chloride: 103 mmol/L (ref 98–111)
Creatinine: 1.12 mg/dL — ABNORMAL HIGH (ref 0.44–1.00)
GFR, Estimated: 47 mL/min — ABNORMAL LOW (ref >60)
Glucose, Bld: 151 mg/dL — ABNORMAL HIGH (ref 70–99)
Potassium: 4 mmol/L (ref 3.5–5.1)
Sodium: 138 mmol/L (ref 135–145)
Total Bilirubin: 0.3 mg/dL (ref 0.3–1.2)
Total Protein: 6.3 g/dL — ABNORMAL LOW (ref 6.5–8.1)

## 2023-05-28 LAB — FOLATE: Folate: 11.3 ng/mL (ref >5.9)

## 2023-05-28 LAB — VITAMIN B12: Vitamin B-12: 1128 pg/mL — ABNORMAL HIGH (ref 180–914)

## 2023-05-28 NOTE — Progress Notes (Signed)
[facility] Cancer Follow up:    Mallory Palmer, MD [address] [place] [address]   DIAGNOSIS:  Cancer Staging  Malignant neoplasm of upper-inner quadrant of left breast in female, estrogen receptor positive (HCC) Staging form: Breast, AJCC 8th Edition - Clinical stage from 08/23/2020: Stage IA (cT1c, cN0, cM0, G2, ER+, PR+, HER2-) - Signed by Lowella Dell, MD on 08/29/2020 Stage prefix: Initial diagnosis Method of lymph node assessment: Clinical   SUMMARY OF ONCOLOGIC HISTORY: Oncology History  Malignant neoplasm of upper-inner quadrant of left breast in female, estrogen receptor positive (HCC)  07/24/2020 Initial Diagnosis   07/24/2020: Left breast UIQ T1CN0 stage Ia grade 2 IDC ER/PR positive HER2 negative Ki-67 10%    08/23/2020 Cancer Staging   Staging form: Breast, AJCC 8th Edition - Clinical stage from 08/23/2020: Stage IA (cT1c, cN0, cM0, G2, ER+, PR+, HER2-) - Signed by Lowella Dell, MD on 08/29/2020   09/06/2020 Surgery   Left lumpectomy: T1c, Nx grade 2 IDC, negative margins, declined radiation   10/2020 -  Anti-estrogen oral therapy   Anastrozole   11/26/2020 Genetic Testing   Negative genetic testing:  No pathogenic variants detected on the [employer] CancerNext-Expanded + RNAinsight panel. A variant of uncertain significance (VUS) was detected in the MSH6 gene called [id number] (c.3930G>C). The report date is 11/26/2020.  The CancerNext-Expanded + RNAinsight gene panel offered by [employer] and includes sequencing and rearrangement analysis for the following 77 genes: AIP, ALK, APC, ATM, AXIN2, BAP1, BARD1, BLM, BMPR1A, BRCA1, BRCA2, BRIP1, CDC73, CDH1, CDK4, CDKN1B, CDKN2A, CHEK2, CTNNA1, DICER1, FANCC, FH, FLCN, GALNT12, KIF1B, LZTR1, MAX, MEN1, MET, MLH1, MSH2, MSH3, MSH6, MUTYH, NBN, NF1, NF2, NTHL1, PALB2, PHOX2B, PMS2, POT1, PRKAR1A, PTCH1, PTEN, RAD51C, RAD51D, RB1, RECQL, RET, SDHA, SDHAF2, SDHB, SDHC, SDHD, SMAD4,  SMARCA4, SMARCB1, SMARCE1, STK11, SUFU, TMEM127, TP53, TSC1, TSC2, VHL and XRCC2 (sequencing and deletion/duplication); EGFR, EGLN1, HOXB13, KIT, MITF, PDGFRA, POLD1 and POLE (sequencing only); EPCAM and GREM1 (deletion/duplication only). RNA data is routinely analyzed for use in variant interpretation for all genes.      CURRENT THERAPY:  INTERVAL HISTORY: KMORA KORELL 89 y.o. female returns for f/u of her anemia, chronic kidney disease, and breast cancer, presents with ongoing fatigue and weakness, particularly in the legs. She has been taking sublingual B12 supplements ( ) and iron twice daily, the latter of which has resulted in black tarry stools. Despite these interventions, the patient reports no significant improvement in energy levels.  The patient also reports body aches, attributed to arthritis. She has been taking Anastrozole for breast cancer, which she believes is causing hair thinning. She has been using high-quality salon shampoo and conditioner, and sleeping on a silk or satin pillowcase to minimize hair damage. She has also been taking Boniva once a month for bone health.  The patient's kidney function is slightly elevated, indicating chronic kidney disease. The patient's hemoglobin levels have not improved significantly since July. The patient denies any breast concerns or abnormalities.   Patient Active Problem List   Diagnosis Date Noted   Breast cancer (HCC) 10/25/2022   Permanent atrial fibrillation (HCC) 10/25/2022   Essential hypertension 10/25/2022   Hyperlipidemia 10/25/2022   Hypokalemia 10/25/2022   Normocytic anemia 10/25/2022   Acute respiratory failure with hypoxia secondary to CAP vs/+ new CHF exacerbation 10/25/2022   CHF exacerbation (HCC) 10/25/2022   CAP (community acquired pneumonia) 10/25/2022   Genetic testing 11/26/2020   History of basal cell carcinoma  Family history of breast cancer    Family history of prostate cancer    Malignant  neoplasm of upper-inner quadrant of left breast in female, 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/2020   Hypokalemia 04/29/2017   Leukocytosis 04/29/2017   Syncope 04/29/2017   UTI (urinary tract infection) 04/28/2017   Fall 04/28/2017   Nausea and vomiting 04/28/2017   Generalized weakness 04/28/2017   Constipation 04/28/2017   Diverticulitis    Edema of extremities 09/03/2016   Dyslipidemia    Permanent atrial fibrillation (HCC) 07/08/2013   Essential hypertension, benign 07/08/2013   Long term (current) use of anticoagulants 07/08/2013   Encounter for long-term (current) use of other medications 07/08/2013    is allergic to sulfa antibiotics and sulfa drugs cross reactors.  MEDICAL HISTORY: Past Medical History:  Diagnosis Date   Breast cancer (HCC) 07/24/2020   Dyslipidemia    Dysrhythmia    atrial fibrillation   Edema extremities 09/03/2016   Family history of breast cancer    Family history of prostate cancer    Fatty liver    GERD (gastroesophageal reflux disease)    History of basal cell carcinoma    Hypertension    Lymphocytosis    followed by Dr. Arbutus Ped   Multinodular goiter    Osteoarthritis    Permanent atrial fibrillation (HCC)    atrial fibrillation s/p DCCV 3/10 and 6/10   PONV (postoperative nausea and vomiting)    PUD (peptic ulcer disease)    Pulmonary nodules    followed  by Dr. Paulino Rily   UTI (urinary tract infection) 04/2017    SURGICAL HISTORY: Past Surgical History:  Procedure Laterality Date   aspiration of cyst  1978   BREAST LUMPECTOMY WITH RADIOACTIVE SEED LOCALIZATION Left 09/06/2020   Procedure: LEFT BREAST LUMPECTOMY WITH RADIOACTIVE SEED LOCALIZATION;  Surgeon: Almond Lint, MD;  Location: [facility] OR;  Service: General;  Laterality: Left;   BREAST SURGERY  1962   right breast for benign lesion   BREAST SURGERY  1971   bilateral lumpectomy for beingn lesions   CARDIOVERSION  07/22/2011   Procedure: CARDIOVERSION;  Surgeon: Quintella Reichert, MD;  Location: [facility] OR;  Service: Cardiovascular;  Laterality: N/A;   CARDIOVERSION  09/02/2011   Procedure: CARDIOVERSION;  Surgeon: Quintella Reichert, MD;  Location: [facility] OR;  Service: Cardiovascular;  Laterality: N/A;   cataract     cataract removal-bilateral   CHOLECYSTECTOMY  2003   CYSTOSCOPY     DILATION AND CURETTAGE OF UTERUS  1979   JOINT REPLACEMENT  2002   Right knee   JOINT REPLACEMENT  2007   left knee    SOCIAL HISTORY: Social History   Socioeconomic History   Marital status: Widowed    Spouse name: Not on file   Number of children: Not on file   Years of education: Not on file   Highest education level: Not on file  Occupational History   Not on file  Tobacco Use   Smoking status: Former    Current packs/day: 1.00    Average packs/day: 1 pack/day for 30.0 years (30.0 ttl pk-yrs)    Types: Cigarettes   Smokeless tobacco: Never  Vaping Use   Vaping status: Never Used  Substance and Sexual Activity   Alcohol use: Yes    Comment: occasional wine   Drug use: No   Sexual activity: Not on file  Other Topics Concern   Not on file  Social History Narrative   ** Merged History Encounter **  Social Determinants of Health   Financial Resource Strain: Not on file  Food Insecurity: No Food Insecurity (10/25/2022)   Hunger Vital Sign    Worried About Running Out of Food in the Last Year: Never true    Ran Out of Food in the Last Year: Never true  Transportation Needs: No Transportation Needs (10/25/2022)   PRAPARE - Administrator, Civil Service (Medical): No    Lack of Transportation (Non-Medical): No  Physical Activity: Not on file  Stress: Not on file  Social Connections: Not on file  Intimate Partner Violence: Not At Risk (10/25/2022)   Humiliation, Afraid, Rape, and Kick questionnaire    Fear of Current or Ex-Partner: No    Emotionally Abused: No    Physically Abused: No    Sexually Abused: No    FAMILY HISTORY: Family History   Problem Relation Age of Onset   Stroke Mother    Stroke Father    Breast cancer Sister        dx 45s   Arthritis Sister    Scoliosis Sister    Heart disease Brother    Prostate cancer Brother        dx >50, metastatic   Prostate cancer Brother        dx >50, metastatic   Prostate cancer Nephew        dx <50   Breast cancer Niece        dx <50   Breast cancer Niece        dx <50   Autoimmune disease Neg Hx     Review of Systems  Constitutional:  Positive for fatigue. Negative for appetite change, chills, fever and unexpected weight change.  HENT:   Negative for hearing loss, lump/mass and trouble swallowing.   Eyes:  Negative for eye problems and icterus.  Respiratory:  Negative for chest tightness, cough and shortness of breath.   Cardiovascular:  Negative for chest pain, leg swelling and palpitations.  Gastrointestinal:  Negative for abdominal distention, abdominal pain, constipation, diarrhea, nausea and vomiting.  Endocrine: Negative for hot flashes.  Genitourinary:  Negative for difficulty urinating.   Musculoskeletal:  Positive for arthralgias.  Skin:  Negative for itching and rash.  Neurological:  Negative for dizziness, extremity weakness, headaches and numbness.  Hematological:  Negative for adenopathy. Does not bruise/bleed easily.  Psychiatric/Behavioral:  Negative for depression. The patient is not nervous/anxious.       PHYSICAL EXAMINATION    Vitals:   05/28/23 1528  BP: (!) 120/55  Pulse: 63  Resp: 18  Temp: 97.6 F (36.4 C)  SpO2: 91%    Physical Exam Constitutional:      General: She is not in acute distress.    Appearance: Normal appearance. She is not toxic-appearing.  HENT:     Head: Normocephalic and atraumatic.     Mouth/Throat:     Mouth: Mucous membranes are moist.     Pharynx: Oropharynx is clear. No oropharyngeal exudate or posterior oropharyngeal erythema.  Eyes:     General: No scleral icterus. Cardiovascular:     Rate and  Rhythm: Normal rate and regular rhythm.     Pulses: Normal pulses.     Heart sounds: Normal heart sounds.  Pulmonary:     Effort: Pulmonary effort is normal.     Breath sounds: Normal breath sounds.  Chest:     Comments: S/p lumpectomy bilaterally, no sign of local recurrence Abdominal:     General: Abdomen is  flat. Bowel sounds are normal. There is no distension.     Palpations: Abdomen is soft.     Tenderness: There is no abdominal tenderness.  Musculoskeletal:        General: No swelling.     Cervical back: Neck supple.  Lymphadenopathy:     Cervical: No cervical adenopathy.     Upper Body:     Right upper body: No axillary adenopathy.     Left upper body: No axillary adenopathy.  Skin:    General: Skin is warm and dry.     Findings: No rash.  Neurological:     General: No focal deficit present.     Mental Status: She is alert.  Psychiatric:        Mood and Affect: Mood normal.        Behavior: Behavior normal.     LABORATORY DATA:  CBC    Component Value Date/Time   WBC 11.9 (H) 05/28/2023 1510   WBC 11.0 (H) 10/29/2022 0225   RBC 2.94 (L) 05/28/2023 1510   HGB 8.4 (L) 05/28/2023 1510   HGB 13.3 09/17/2021 1024   HGB 13.5 02/22/2009 1035   HCT 27.8 (L) 05/28/2023 1510   HCT 40.3 09/17/2021 1024   HCT 38.5 02/22/2009 1035   PLT 316 05/28/2023 1510   PLT 247 09/17/2021 1024   MCV 94.6 05/28/2023 1510   MCV 93 09/17/2021 1024   MCV 91.2 02/22/2009 1035   MCH 28.6 05/28/2023 1510   MCHC 30.2 05/28/2023 1510   RDW 13.8 05/28/2023 1510   RDW 12.6 09/17/2021 1024   RDW 12.9 02/22/2009 1035   LYMPHSABS 3.1 05/28/2023 1510   LYMPHSABS 2.9 02/22/2009 1035   MONOABS 0.9 05/28/2023 1510   MONOABS 0.6 02/22/2009 1035   EOSABS 0.2 05/28/2023 1510   EOSABS 0.5 02/22/2009 1035   BASOSABS 0.1 05/28/2023 1510   BASOSABS 0.1 02/22/2009 1035    CMP     Component Value Date/Time   NA 138 05/28/2023 1510   NA 142 09/17/2021 1024   K 4.0 05/28/2023 1510   CL 103  05/28/2023 1510   CO2 29 05/28/2023 1510   GLUCOSE 151 (H) 05/28/2023 1510   BUN 26 (H) 05/28/2023 1510   BUN 20 09/17/2021 1024   CREATININE 1.12 (H) 05/28/2023 1510   CREATININE 1.32 (H) 08/09/2015 1122   CALCIUM 9.9 05/28/2023 1510   PROT 6.3 (L) 05/28/2023 1510   PROT 6.5 06/01/2019 0958   ALBUMIN 3.7 05/28/2023 1510   ALBUMIN 4.2 06/01/2019 0958   AST 10 (L) 05/28/2023 1510   ALT 6 05/28/2023 1510   ALKPHOS 43 05/28/2023 1510   BILITOT 0.3 05/28/2023 1510   GFRNONAA 47 (L) 05/28/2023 1510   GFRAA 44 (L) 02/22/2020 1041       PENDING LABS:   RADIOGRAPHIC STUDIES:  No results found.   PATHOLOGY:     ASSESSMENT and THERAPY PLAN:   Malignant neoplasm of upper-inner quadrant of left breast in female, estrogen receptor positive (HCC) Anemia Low B12 level and hemoglobin not significantly improved since July. Patient is taking B12 sublingually and iron twice daily. Weakness reported. No blood in stool, but black stools due to iron supplementation. -Continue B12 and iron supplementation. -Check iron and B12 levels. -Check erythropoietin level due to chronic kidney disease.  Chronic Kidney Disease Mildly elevated kidney function. -Continue monitoring kidney function.  Breast Cancer Patient is on Anastrozole with side effect of hair thinning. No new breast concerns. -Continue Anastrozole. -Recommendations given to  minimize hair thinning (limit heat, use high quality shampoo, sleep on silk or satin pillowcase). -Scheduled mammogram for December. -F.u with Dr. Pamelia Hoit next month  Arthritis General body aches reported. -Continue current management.  General Health Maintenance -Continue Boniva once a month. -Check labs for anemia workup.   All questions were answered. The patient knows to call the clinic with any problems, questions or concerns. We can certainly see the patient much sooner if necessary.  Total encounter time:30 minutes*in face-to-face visit  time, chart review, lab review, care coordination, order entry, and documentation of the encounter time.    Lillard Anes, NP 05/28/23 4:30 PM Medical Oncology and Hematology [facility] [address] [place], [place] [address] Tel. [phone]    Fax. [phone]  *Total Encounter Time as defined by the Centers for Medicare and Medicaid Services includes, in addition to the face-to-face time of a patient visit (documented in the note above) non-face-to-face time: obtaining and reviewing outside history, ordering and reviewing medications, tests or procedures, care coordination (communications with other health care professionals or caregivers) and documentation in the medical record.

## 2023-05-28 NOTE — Assessment & Plan Note (Signed)
Anemia Low B12 level and hemoglobin not significantly improved since July. Patient is taking B12 sublingually and iron twice daily. Weakness reported. No blood in stool, but black stools due to iron supplementation. -Continue B12 and iron supplementation. -Check iron and B12 levels. -Check erythropoietin level due to chronic kidney disease.  Chronic Kidney Disease Mildly elevated kidney function. -Continue monitoring kidney function.  Breast Cancer Patient is on Anastrozole with side effect of hair thinning. No new breast concerns. -Continue Anastrozole. -Recommendations given to minimize hair thinning (limit heat, use high quality shampoo, sleep on silk or satin pillowcase). -Scheduled mammogram for December. -F.u with Dr. Pamelia Hoit next month  Arthritis General body aches reported. -Continue current management.  General Health Maintenance -Continue Boniva once a month. -Check labs for anemia workup.

## 2023-05-29 LAB — FERRITIN: Ferritin: 20 ng/mL (ref 11–307)

## 2023-05-29 LAB — ERYTHROPOIETIN: Erythropoietin: 103.7 m[IU]/mL — ABNORMAL HIGH (ref 2.6–18.5)

## 2023-06-18 ENCOUNTER — Inpatient Hospital Stay: Payer: Medicare HMO | Attending: Adult Health | Admitting: Hematology and Oncology

## 2023-06-18 VITALS — BP 127/48 | HR 66 | Temp 97.7°F | Resp 18 | Ht 65.0 in | Wt 134.2 lb

## 2023-06-18 DIAGNOSIS — Z79811 Long term (current) use of aromatase inhibitors: Secondary | ICD-10-CM | POA: Insufficient documentation

## 2023-06-18 DIAGNOSIS — D649 Anemia, unspecified: Secondary | ICD-10-CM | POA: Diagnosis not present

## 2023-06-18 DIAGNOSIS — C50212 Malignant neoplasm of upper-inner quadrant of left female breast: Secondary | ICD-10-CM | POA: Diagnosis not present

## 2023-06-18 DIAGNOSIS — I951 Orthostatic hypotension: Secondary | ICD-10-CM | POA: Diagnosis not present

## 2023-06-18 DIAGNOSIS — I10 Essential (primary) hypertension: Secondary | ICD-10-CM | POA: Insufficient documentation

## 2023-06-18 DIAGNOSIS — Z882 Allergy status to sulfonamides status: Secondary | ICD-10-CM | POA: Insufficient documentation

## 2023-06-18 DIAGNOSIS — R5383 Other fatigue: Secondary | ICD-10-CM | POA: Diagnosis not present

## 2023-06-18 DIAGNOSIS — N289 Disorder of kidney and ureter, unspecified: Secondary | ICD-10-CM | POA: Insufficient documentation

## 2023-06-18 DIAGNOSIS — R0602 Shortness of breath: Secondary | ICD-10-CM | POA: Insufficient documentation

## 2023-06-18 DIAGNOSIS — Z170 Estrogen receptor positive status [ER+]: Secondary | ICD-10-CM | POA: Insufficient documentation

## 2023-06-18 DIAGNOSIS — Z79899 Other long term (current) drug therapy: Secondary | ICD-10-CM | POA: Insufficient documentation

## 2023-06-18 DIAGNOSIS — Z1721 Progesterone receptor positive status: Secondary | ICD-10-CM | POA: Insufficient documentation

## 2023-06-18 NOTE — Assessment & Plan Note (Signed)
Dr. Darnelle Catalan patient to establish oncology care with me 07/24/2020: Left breast UIQ T1CN0 stage Ia grade 2 IDC ER/PR positive HER2 negative Ki-67 10% 09/06/2020: Left lumpectomy: T1CNX grade 2 IDC with negative margins, opted against radiation 11/26/2020: Genetics: Negative   Current treatment: Anastrozole started 10/02/2020 Anastrozole toxicities: Osteoporosis: T score -2.5 on 04/22/2021 currently on ibandronate   Breast cancer surveillance: Breast exam 06/18/2023: Benign Mammogram 07/30/2022: Benign breast density category C CT AP for abdominal pain: 04/06/2022: Diverticulosis   Return to clinic in 1 year for follow-up

## 2023-06-19 ENCOUNTER — Encounter: Payer: Self-pay | Admitting: Hematology and Oncology

## 2023-06-19 NOTE — Progress Notes (Signed)
Patient Care Team: Mila Palmer, MD as PCP - General (Family Medicine) Quintella Reichert, MD as PCP - Cardiology (Cardiology) Dorothy Puffer, MD as Consulting Physician (Radiation Oncology) Almond Lint, MD as Consulting Physician (General Surgery) Quintella Reichert, MD as Consulting Physician (Cardiology) Shea Evans, MD as Consulting Physician (Obstetrics and Gynecology) Cherlyn Roberts, MD as Referring Physician (Dermatology)  DIAGNOSIS:  Encounter Diagnoses  Name Primary?   Malignant neoplasm of upper-inner quadrant of left breast in female, estrogen receptor positive (HCC) Yes   Normocytic anemia     SUMMARY OF ONCOLOGIC HISTORY: Oncology History  Malignant neoplasm of upper-inner quadrant of left breast in female, estrogen receptor positive (HCC)  07/24/2020 Initial Diagnosis   07/24/2020: Left breast UIQ T1CN0 stage Ia grade 2 IDC ER/PR positive HER2 negative Ki-67 10%    08/23/2020 Cancer Staging   Staging form: Breast, AJCC 8th Edition - Clinical stage from 08/23/2020: Stage IA (cT1c, cN0, cM0, G2, ER+, PR+, HER2-) - Signed by Lowella Dell, MD on 08/29/2020   09/06/2020 Surgery   Left lumpectomy: T1c, Nx grade 2 IDC, negative margins, declined radiation   10/2020 -  Anti-estrogen oral therapy   Anastrozole   11/26/2020 Genetic Testing   Negative genetic testing:  No pathogenic variants detected on the [employer] CancerNext-Expanded + RNAinsight panel. A variant of uncertain significance (VUS) was detected in the MSH6 gene called [id number] (c.3930G>C). The report date is 11/26/2020.  The CancerNext-Expanded + RNAinsight gene panel offered by [employer] and includes sequencing and rearrangement analysis for the following 77 genes: AIP, ALK, APC, ATM, AXIN2, BAP1, BARD1, BLM, BMPR1A, BRCA1, BRCA2, BRIP1, CDC73, CDH1, CDK4, CDKN1B, CDKN2A, CHEK2, CTNNA1, DICER1, FANCC, FH, FLCN, GALNT12, KIF1B, LZTR1, MAX, MEN1, MET, MLH1, MSH2, MSH3, MSH6, MUTYH, NBN, NF1, NF2, NTHL1,  PALB2, PHOX2B, PMS2, POT1, PRKAR1A, PTCH1, PTEN, RAD51C, RAD51D, RB1, RECQL, RET, SDHA, SDHAF2, SDHB, SDHC, SDHD, SMAD4, SMARCA4, SMARCB1, SMARCE1, STK11, SUFU, TMEM127, TP53, TSC1, TSC2, VHL and XRCC2 (sequencing and deletion/duplication); EGFR, EGLN1, HOXB13, KIT, MITF, PDGFRA, POLD1 and POLE (sequencing only); EPCAM and GREM1 (deletion/duplication only). RNA data is routinely analyzed for use in variant interpretation for all genes.      CHIEF COMPLIANT: Follow-up of anemia history of breast cancer  HISTORY OF PRESENT ILLNESS: History of Present Illness   Mallory Stephens, a patient with a history of breast cancer, presents with ongoing issues of anemia. She reports feeling fatigued and weak, and notes a change in her skin color, describing herself as "pale". Despite extensive investigations, including checks on iron levels, B12 levels, and folic acid levels, no clear cause for the anemia has been identified. The patient's bone marrow is not producing enough blood, despite having all the necessary nutrients. The patient's anemia has been stable over the past six months, but it is not improving. The patient also reports shortness of breath when standing for long periods or doing something strenuous. She has a history of orthostatic hypertension.         ALLERGIES:  is allergic to sulfa antibiotics and sulfa drugs cross reactors.  MEDICATIONS:  Current Outpatient Medications  Medication Sig Dispense Refill   acetaminophen (TYLENOL) 325 MG tablet Take 325 mg by mouth every 6 (six) hours as needed for moderate pain.     amLODipine (NORVASC) 5 MG tablet TAKE 1 TABLET (5 MG TOTAL) BY MOUTH DAILY. 90 tablet 3   anastrozole (ARIMIDEX) 1 MG tablet Take 1 tablet (1 mg total) by mouth daily. 90 tablet 3   anastrozole (ARIMIDEX)  1 MG tablet Take 1 mg by mouth daily.     cholecalciferol (VITAMIN D3) 25 MCG (1000 UNIT) tablet Take 1 tablet (1,000 Units total) by mouth daily. 90 tablet 4   ELIQUIS 5 MG TABS  tablet TAKE 1 TABLET TWICE A DAY 180 tablet 1   ELIQUIS 5 MG TABS tablet Take 5 mg by mouth 2 (two) times daily.     famotidine (PEPCID) 40 MG tablet Take 40 mg by mouth at bedtime.     famotidine (PEPCID) 40 MG tablet Take 40 mg by mouth daily.     ferrous gluconate (FERGON) 324 MG tablet Take 1 tablet (324 mg total) by mouth 2 (two) times daily with a meal. 30 tablet 3   furosemide (LASIX) 20 MG tablet Take 1 tablet (20 mg total) by mouth every other day. 30 tablet 2   ibandronate (BONIVA) 150 MG tablet Take 1 tablet (150 mg total) by mouth every 30 (thirty) days. Take in the morning with a full glass of water, on an empty stomach, and do not take anything else by mouth or lie down for the next 30 min. Take on the 10th day of each month. 3 tablet 4   ibandronate (BONIVA) 150 MG tablet Take 150 mg by mouth every 30 (thirty) days.     losartan (COZAAR) 100 MG tablet TAKE 1 TABLET BY MOUTH EVERY DAY 90 tablet 1   losartan (COZAAR) 25 MG tablet Take 1 tablet (25 mg total) by mouth daily. 30 tablet 11   metoprolol tartrate (LOPRESSOR) 25 MG tablet TAKE 1 TABLET TWICE A DAY 180 tablet 3   metoprolol tartrate (LOPRESSOR) 25 MG tablet Take 25 mg by mouth 2 (two) times daily.     Multiple Vitamin (MULTIVITAMIN WITH MINERALS) TABS tablet Take 1 tablet by mouth daily.     PARoxetine (PAXIL) 10 MG tablet Take 10 mg by mouth every morning.     PARoxetine (PAXIL) 10 MG tablet Take 10 mg by mouth daily.     polyvinyl alcohol (LIQUIFILM TEARS) 1.4 % ophthalmic solution Place 1-2 drops into both eyes every 6 (six) hours as needed for dry eyes.     pravastatin (PRAVACHOL) 20 MG tablet Take 20 mg by mouth at bedtime.     pravastatin (PRAVACHOL) 20 MG tablet Take 20 mg by mouth daily.     solifenacin (VESICARE) 5 MG tablet Take 5 mg by mouth daily.     solifenacin (VESICARE) 5 MG tablet Take 5 mg by mouth daily.     No current facility-administered medications for this visit.    PHYSICAL EXAMINATION: ECOG  PERFORMANCE STATUS: 1 - Symptomatic but completely ambulatory  Vitals:   06/18/23 1502 06/18/23 1503  BP: (!) 112/37 (!) 127/48  Pulse: 66   Resp: 18   Temp: 97.7 F (36.5 C)   SpO2: 95%    Filed Weights   06/18/23 1502  Weight: 134 lb 3.2 oz (60.9 kg)    LABORATORY DATA:  I have reviewed the data as listed    Latest Ref Rng & Units 05/28/2023    3:10 PM 02/27/2023   10:40 AM 10/29/2022    2:25 AM  CMP  Glucose 70 - 99 mg/dL 161  99  096   BUN 8 - 23 mg/dL 26  30  15    Creatinine 0.44 - 1.00 mg/dL 0.45  4.09  8.11   Sodium 135 - 145 mmol/L 138  139  140   Potassium 3.5 - 5.1 mmol/L 4.0  4.1  4.0   Chloride 98 - 111 mmol/L 103  105  103   CO2 22 - 32 mmol/L 29  28  29    Calcium 8.9 - 10.3 mg/dL 9.9  19.1  9.0   Total Protein 6.5 - 8.1 g/dL 6.3  5.9    Total Bilirubin 0.3 - 1.2 mg/dL 0.3  0.4    Alkaline Phos 38 - 126 U/L 43  47    AST 15 - 41 U/L 10  11    ALT 0 - 44 U/L 6  6      Lab Results  Component Value Date   WBC 11.9 (H) 05/28/2023   HGB 8.4 (L) 05/28/2023   HCT 27.8 (L) 05/28/2023   MCV 94.6 05/28/2023   PLT 316 05/28/2023   NEUTROABS 7.6 05/28/2023    ASSESSMENT & PLAN:  Malignant neoplasm of upper-inner quadrant of left breast in female, estrogen receptor positive (HCC) Dr. Darnelle Catalan patient to establish oncology care with me 07/24/2020: Left breast UIQ T1CN0 stage Ia grade 2 IDC ER/PR positive HER2 negative Ki-67 10% 09/06/2020: Left lumpectomy: T1CNX grade 2 IDC with negative margins, opted against radiation 11/26/2020: Genetics: Negative   Current treatment: Anastrozole started 10/02/2020 Anastrozole toxicities: Osteoporosis: T score -2.5 on 04/22/2021 currently on ibandronate   Breast cancer surveillance: Mammogram 07/30/2022: Benign breast density category C CT AP for abdominal pain: 04/06/2022: Diverticulosis      Anemia Persistent anemia with hemoglobin levels in the 8s, despite extensive workup including iron, B12, folic acid levels, and  myeloma screening. No clear cause identified. Patient reports fatigue, weakness, and pallor. -Plan to administer intravenous iron (Monoferric if approved, otherwise Feraheme) to potentially increase hemoglobin levels. -Check labs in six weeks to assess response to treatment. -Consider Retacrit injection if no improvement with IV iron.  Breast Cancer Stable with no current issues reported. -Continue current management and scheduled mammogram in December.  Orthostatic Hypotension Patient reports occasional dizziness upon standing, a long-standing issue. -Continue current management, monitor symptoms.  Mild Kidney Dysfunction GFR slightly below normal range. -Monitor kidney function, no immediate intervention required.      Recheck labs 1 month after IV iron infusions and follow-up after    Orders Placed This Encounter  Procedures   CBC with Differential (Cancer Center Only)    Standing Status:   Future    Standing Expiration Date:   06/17/2024   Ferritin    Standing Status:   Future    Standing Expiration Date:   06/17/2024   Iron and Iron Binding Capacity ([facility] only)    Standing Status:   Future    Standing Expiration Date:   06/17/2024   CMP (Cancer Center only)    Standing Status:   Future    Standing Expiration Date:   06/17/2024   Retic Panel    Standing Status:   Future    Standing Expiration Date:   06/17/2024   The patient has a good understanding of the overall plan. she agrees with it. she will call with any problems that may develop before the next visit here. Total time spent: 30 mins including face to face time and time spent for planning, charting and co-ordination of care   Tamsen Meek, MD 06/19/23

## 2023-07-26 ENCOUNTER — Other Ambulatory Visit: Payer: Self-pay | Admitting: Hematology and Oncology

## 2023-07-27 ENCOUNTER — Encounter: Payer: Self-pay | Admitting: Hematology and Oncology

## 2023-08-03 ENCOUNTER — Other Ambulatory Visit: Payer: Self-pay | Admitting: Adult Health

## 2023-08-03 ENCOUNTER — Encounter: Payer: Self-pay | Admitting: Hematology and Oncology

## 2023-08-03 ENCOUNTER — Ambulatory Visit
Admission: RE | Admit: 2023-08-03 | Discharge: 2023-08-03 | Disposition: A | Discharge status: Home / Self Care | Payer: Medicare HMO | Source: Ambulatory Visit | Attending: Adult Health | Admitting: Adult Health

## 2023-08-03 DIAGNOSIS — Z170 Estrogen receptor positive status [ER+]: Secondary | ICD-10-CM

## 2023-08-07 ENCOUNTER — Telehealth: Payer: Self-pay | Admitting: *Deleted

## 2023-08-07 ENCOUNTER — Other Ambulatory Visit: Payer: Self-pay | Admitting: Hematology and Oncology

## 2023-08-07 NOTE — Telephone Encounter (Signed)
 Received call from pt daughter Mallory Stephens stating they have not heard from scheduling to set up IV iron appt.  Per PA team, Monoferric is non preferred, MD ordered IV Venofer 300 mg weekly x3. RN reached out to management with scheduling team to arrange appts.

## 2023-08-07 NOTE — Progress Notes (Signed)
 Changed IV iron to Venofer because of insurance reasons.  Mallory Stephens

## 2023-08-09 ENCOUNTER — Other Ambulatory Visit: Payer: Self-pay | Admitting: Cardiology

## 2023-08-09 DIAGNOSIS — I4821 Permanent atrial fibrillation: Secondary | ICD-10-CM

## 2023-08-10 NOTE — Telephone Encounter (Signed)
 Prescription refill request for Eliquis received. Indication:afib Last office visit:1/24 Scr:1.12  10/24 Age: 89 Weight:60.9  kg  Prescription refilled

## 2023-08-12 ENCOUNTER — Inpatient Hospital Stay: Payer: Medicare HMO | Attending: Adult Health

## 2023-08-12 VITALS — BP 155/73 | HR 76 | Temp 97.8°F | Resp 16

## 2023-08-12 DIAGNOSIS — I110 Hypertensive heart disease with heart failure: Secondary | ICD-10-CM | POA: Diagnosis not present

## 2023-08-12 DIAGNOSIS — C50212 Malignant neoplasm of upper-inner quadrant of left female breast: Secondary | ICD-10-CM | POA: Insufficient documentation

## 2023-08-12 DIAGNOSIS — Z170 Estrogen receptor positive status [ER+]: Secondary | ICD-10-CM | POA: Insufficient documentation

## 2023-08-12 DIAGNOSIS — D649 Anemia, unspecified: Secondary | ICD-10-CM | POA: Insufficient documentation

## 2023-08-12 DIAGNOSIS — Z79811 Long term (current) use of aromatase inhibitors: Secondary | ICD-10-CM | POA: Insufficient documentation

## 2023-08-12 DIAGNOSIS — Z79899 Other long term (current) drug therapy: Secondary | ICD-10-CM | POA: Insufficient documentation

## 2023-08-12 DIAGNOSIS — E876 Hypokalemia: Secondary | ICD-10-CM | POA: Diagnosis not present

## 2023-08-12 MED ORDER — SODIUM CHLORIDE 0.9 % IV SOLN
INTRAVENOUS | Status: AC
Start: 2023-08-12 — End: ?

## 2023-08-12 MED ORDER — SODIUM CHLORIDE 0.9 % IV SOLN
300.0000 mg | Freq: Once | INTRAVENOUS | Status: AC
  Administered 2023-08-12: 300 mg via INTRAVENOUS
  Filled 2023-08-12: qty 300

## 2023-08-12 NOTE — Patient Instructions (Signed)
 Iron Sucrose Injection What is this medication? IRON SUCROSE (EYE ern SOO krose) treats low levels of iron (iron deficiency anemia) in people with kidney disease. Iron is a mineral that plays an important role in making red blood cells, which carry oxygen from your lungs to the rest of your body. This medicine may be used for other purposes; ask your health care provider or pharmacist if you have questions. COMMON BRAND NAME(S): Venofer What should I tell my care team before I take this medication? They need to know if you have any of these conditions: Anemia not caused by low iron levels Heart disease High levels of iron in the blood Kidney disease Liver disease An unusual or allergic reaction to iron, other medications, foods, dyes, or preservatives Pregnant or trying to get pregnant Breastfeeding How should I use this medication? This medication is for infusion into a vein. It is given in a hospital or clinic setting. Talk to your care team about the use of this medication in children. While this medication may be prescribed for children as young as 2 years for selected conditions, precautions do apply. Overdosage: If you think you have taken too much of this medicine contact a poison control center or emergency room at once. NOTE: This medicine is only for you. Do not share this medicine with others. What if I miss a dose? Keep appointments for follow-up doses. It is important not to miss your dose. Call your care team if you are unable to keep an appointment. What may interact with this medication? Do not take this medication with any of the following: Deferoxamine Dimercaprol Other iron products This medication may also interact with the following: Chloramphenicol Deferasirox This list may not describe all possible interactions. Give your health care provider a list of all the medicines, herbs, non-prescription drugs, or dietary supplements you use. Also tell them if you smoke,  drink alcohol, or use illegal drugs. Some items may interact with your medicine. What should I watch for while using this medication? Visit your care team regularly. Tell your care team if your symptoms do not start to get better or if they get worse. You may need blood work done while you are taking this medication. You may need to follow a special diet. Talk to your care team. Foods that contain iron include: whole grains/cereals, dried fruits, beans, or peas, leafy green vegetables, and organ meats (liver, kidney). What side effects may I notice from receiving this medication? Side effects that you should report to your care team as soon as possible: Allergic reactions--skin rash, itching, hives, swelling of the face, lips, tongue, or throat Low blood pressure--dizziness, feeling faint or lightheaded, blurry vision Shortness of breath Side effects that usually do not require medical attention (report to your care team if they continue or are bothersome): Flushing Headache Joint pain Muscle pain Nausea Pain, redness, or irritation at injection site This list may not describe all possible side effects. Call your doctor for medical advice about side effects. You may report side effects to FDA at 1-800-FDA-1088. Where should I keep my medication? This medication is given in a hospital or clinic. It will not be stored at home. NOTE: This sheet is a summary. It may not cover all possible information. If you have questions about this medicine, talk to your doctor, pharmacist, or health care provider.  2024 Elsevier/Gold Standard (2022-12-26 00:00:00)

## 2023-08-13 ENCOUNTER — Inpatient Hospital Stay (HOSPITAL_BASED_OUTPATIENT_CLINIC_OR_DEPARTMENT_OTHER)
Admission: EM | Admit: 2023-08-13 | Discharge: 2023-08-17 | DRG: 291 | Disposition: A | Discharge status: Home Health | Payer: Medicare HMO | Attending: Internal Medicine | Admitting: Internal Medicine

## 2023-08-13 ENCOUNTER — Emergency Department (HOSPITAL_BASED_OUTPATIENT_CLINIC_OR_DEPARTMENT_OTHER): Payer: Medicare HMO | Admitting: Radiology

## 2023-08-13 ENCOUNTER — Encounter (HOSPITAL_COMMUNITY): Payer: Self-pay | Admitting: Internal Medicine

## 2023-08-13 ENCOUNTER — Other Ambulatory Visit: Payer: Self-pay

## 2023-08-13 DIAGNOSIS — Z85828 Personal history of other malignant neoplasm of skin: Secondary | ICD-10-CM | POA: Diagnosis not present

## 2023-08-13 DIAGNOSIS — I4821 Permanent atrial fibrillation: Secondary | ICD-10-CM | POA: Diagnosis present

## 2023-08-13 DIAGNOSIS — I272 Pulmonary hypertension, unspecified: Secondary | ICD-10-CM | POA: Diagnosis not present

## 2023-08-13 DIAGNOSIS — Z803 Family history of malignant neoplasm of breast: Secondary | ICD-10-CM | POA: Diagnosis not present

## 2023-08-13 DIAGNOSIS — Z8261 Family history of arthritis: Secondary | ICD-10-CM

## 2023-08-13 DIAGNOSIS — Z79811 Long term (current) use of aromatase inhibitors: Secondary | ICD-10-CM | POA: Diagnosis not present

## 2023-08-13 DIAGNOSIS — E785 Hyperlipidemia, unspecified: Secondary | ICD-10-CM | POA: Diagnosis not present

## 2023-08-13 DIAGNOSIS — Z882 Allergy status to sulfonamides status: Secondary | ICD-10-CM | POA: Diagnosis not present

## 2023-08-13 DIAGNOSIS — K219 Gastro-esophageal reflux disease without esophagitis: Secondary | ICD-10-CM | POA: Diagnosis present

## 2023-08-13 DIAGNOSIS — K760 Fatty (change of) liver, not elsewhere classified: Secondary | ICD-10-CM | POA: Diagnosis present

## 2023-08-13 DIAGNOSIS — Z87891 Personal history of nicotine dependence: Secondary | ICD-10-CM | POA: Diagnosis not present

## 2023-08-13 DIAGNOSIS — I10 Essential (primary) hypertension: Secondary | ICD-10-CM | POA: Diagnosis present

## 2023-08-13 DIAGNOSIS — Z1152 Encounter for screening for COVID-19: Secondary | ICD-10-CM

## 2023-08-13 DIAGNOSIS — D509 Iron deficiency anemia, unspecified: Secondary | ICD-10-CM | POA: Diagnosis present

## 2023-08-13 DIAGNOSIS — J9601 Acute respiratory failure with hypoxia: Secondary | ICD-10-CM | POA: Diagnosis not present

## 2023-08-13 DIAGNOSIS — C50212 Malignant neoplasm of upper-inner quadrant of left female breast: Secondary | ICD-10-CM

## 2023-08-13 DIAGNOSIS — I110 Hypertensive heart disease with heart failure: Secondary | ICD-10-CM | POA: Diagnosis not present

## 2023-08-13 DIAGNOSIS — J189 Pneumonia, unspecified organism: Principal | ICD-10-CM | POA: Diagnosis present

## 2023-08-13 DIAGNOSIS — Z853 Personal history of malignant neoplasm of breast: Secondary | ICD-10-CM

## 2023-08-13 DIAGNOSIS — Z96653 Presence of artificial knee joint, bilateral: Secondary | ICD-10-CM | POA: Diagnosis not present

## 2023-08-13 DIAGNOSIS — Z8249 Family history of ischemic heart disease and other diseases of the circulatory system: Secondary | ICD-10-CM

## 2023-08-13 DIAGNOSIS — D649 Anemia, unspecified: Secondary | ICD-10-CM | POA: Diagnosis present

## 2023-08-13 DIAGNOSIS — Z79899 Other long term (current) drug therapy: Secondary | ICD-10-CM

## 2023-08-13 DIAGNOSIS — I5033 Acute on chronic diastolic (congestive) heart failure: Secondary | ICD-10-CM | POA: Diagnosis present

## 2023-08-13 DIAGNOSIS — E876 Hypokalemia: Secondary | ICD-10-CM | POA: Diagnosis present

## 2023-08-13 DIAGNOSIS — Z823 Family history of stroke: Secondary | ICD-10-CM | POA: Diagnosis not present

## 2023-08-13 DIAGNOSIS — Z7901 Long term (current) use of anticoagulants: Secondary | ICD-10-CM | POA: Diagnosis not present

## 2023-08-13 DIAGNOSIS — I509 Heart failure, unspecified: Secondary | ICD-10-CM | POA: Insufficient documentation

## 2023-08-13 LAB — CBC WITH DIFFERENTIAL/PLATELET
Abs Immature Granulocytes: 0.07 10*3/uL (ref 0.00–0.07)
Basophils Absolute: 0.1 10*3/uL (ref 0.0–0.1)
Basophils Relative: 1 %
Eosinophils Absolute: 0.1 10*3/uL (ref 0.0–0.5)
Eosinophils Relative: 1 %
HCT: 29.2 % — ABNORMAL LOW (ref 36.0–46.0)
Hemoglobin: 8.7 g/dL — ABNORMAL LOW (ref 12.0–15.0)
Immature Granulocytes: 1 %
Lymphocytes Relative: 19 %
Lymphs Abs: 2.1 10*3/uL (ref 0.7–4.0)
MCH: 26.9 pg (ref 26.0–34.0)
MCHC: 29.8 g/dL — ABNORMAL LOW (ref 30.0–36.0)
MCV: 90.4 fL (ref 80.0–100.0)
Monocytes Absolute: 0.7 10*3/uL (ref 0.1–1.0)
Monocytes Relative: 6 %
Neutro Abs: 8.3 10*3/uL — ABNORMAL HIGH (ref 1.7–7.7)
Neutrophils Relative %: 72 %
Platelets: 368 10*3/uL (ref 150–400)
RBC: 3.23 MIL/uL — ABNORMAL LOW (ref 3.87–5.11)
RDW: 14.6 % (ref 11.5–15.5)
WBC: 11.4 10*3/uL — ABNORMAL HIGH (ref 4.0–10.5)
nRBC: 0 % (ref 0.0–0.2)

## 2023-08-13 LAB — RESP PANEL BY RT-PCR (RSV, FLU A&B, COVID)  RVPGX2
Influenza A by PCR: NEGATIVE
Influenza B by PCR: NEGATIVE
Resp Syncytial Virus by PCR: NEGATIVE
SARS Coronavirus 2 by RT PCR: NEGATIVE

## 2023-08-13 LAB — COMPREHENSIVE METABOLIC PANEL
ALT: 8 U/L (ref 0–44)
AST: 11 U/L — ABNORMAL LOW (ref 15–41)
Albumin: 3.5 g/dL (ref 3.5–5.0)
Alkaline Phosphatase: 43 U/L (ref 38–126)
Anion gap: 8 (ref 5–15)
BUN: 17 mg/dL (ref 8–23)
CO2: 29 mmol/L (ref 22–32)
Calcium: 8.7 mg/dL — ABNORMAL LOW (ref 8.9–10.3)
Chloride: 105 mmol/L (ref 98–111)
Creatinine, Ser: 0.8 mg/dL (ref 0.44–1.00)
GFR, Estimated: >60 mL/min (ref >60)
Glucose, Bld: 127 mg/dL — ABNORMAL HIGH (ref 70–99)
Potassium: 2.9 mmol/L — ABNORMAL LOW (ref 3.5–5.1)
Sodium: 142 mmol/L (ref 135–145)
Total Bilirubin: 0.8 mg/dL (ref 0.0–1.2)
Total Protein: 5.8 g/dL — ABNORMAL LOW (ref 6.5–8.1)

## 2023-08-13 LAB — MRSA NEXT GEN BY PCR, NASAL: MRSA by PCR Next Gen: NOT DETECTED

## 2023-08-13 LAB — BRAIN NATRIURETIC PEPTIDE: B Natriuretic Peptide: 897 pg/mL — ABNORMAL HIGH (ref 0.0–100.0)

## 2023-08-13 LAB — PROCALCITONIN: Procalcitonin: <0.1 ng/mL

## 2023-08-13 LAB — TROPONIN I (HIGH SENSITIVITY): Troponin I (High Sensitivity): 11 ng/L (ref <18)

## 2023-08-13 MED ORDER — ONDANSETRON HCL 4 MG PO TABS: 4.0000 mg | ORAL_TABLET | Freq: Four times a day (QID) | ORAL | Status: DC | PRN

## 2023-08-13 MED ORDER — PRAVASTATIN SODIUM 10 MG PO TABS
20.0000 mg | ORAL_TABLET | Freq: Every day | ORAL | Status: DC
  Administered 2023-08-14 – 2023-08-16 (×4): 20 mg via ORAL
  Filled 2023-08-13 (×5): qty 2
  Filled 2023-08-13: qty 1

## 2023-08-13 MED ORDER — PAROXETINE HCL 10 MG PO TABS
10.0000 mg | ORAL_TABLET | ORAL | Status: DC
  Administered 2023-08-14 – 2023-08-17 (×4): 10 mg via ORAL
  Filled 2023-08-13 (×5): qty 1

## 2023-08-13 MED ORDER — ONDANSETRON HCL 4 MG/2ML IJ SOLN: 4.0000 mg | Freq: Four times a day (QID) | INTRAMUSCULAR | Status: DC | PRN

## 2023-08-13 MED ORDER — VITAMIN D 25 MCG (1000 UNIT) PO TABS
1000.0000 [IU] | ORAL_TABLET | Freq: Every day | ORAL | Status: DC
  Administered 2023-08-13 – 2023-08-17 (×5): 1000 [IU] via ORAL
  Filled 2023-08-13 (×5): qty 1

## 2023-08-13 MED ORDER — ALBUTEROL SULFATE HFA 108 (90 BASE) MCG/ACT IN AERS: 2.0000 | INHALATION_SPRAY | RESPIRATORY_TRACT | Status: DC | PRN

## 2023-08-13 MED ORDER — AMLODIPINE BESYLATE 5 MG PO TABS
5.0000 mg | ORAL_TABLET | Freq: Every day | ORAL | Status: DC
  Administered 2023-08-13 – 2023-08-14 (×2): 5 mg via ORAL
  Filled 2023-08-13 (×2): qty 1

## 2023-08-13 MED ORDER — FAMOTIDINE 20 MG PO TABS
40.0000 mg | ORAL_TABLET | Freq: Every day | ORAL | Status: DC
  Administered 2023-08-13 – 2023-08-16 (×4): 40 mg via ORAL
  Filled 2023-08-13 (×4): qty 2

## 2023-08-13 MED ORDER — ALBUTEROL SULFATE (2.5 MG/3ML) 0.083% IN NEBU: 2.5000 mg | INHALATION_SOLUTION | RESPIRATORY_TRACT | Status: DC | PRN

## 2023-08-13 MED ORDER — FUROSEMIDE 10 MG/ML IJ SOLN
40.0000 mg | Freq: Two times a day (BID) | INTRAMUSCULAR | Status: DC
  Administered 2023-08-13 – 2023-08-14 (×2): 40 mg via INTRAVENOUS
  Filled 2023-08-13 (×2): qty 4

## 2023-08-13 MED ORDER — ACETAMINOPHEN 325 MG PO TABS
325.0000 mg | ORAL_TABLET | Freq: Four times a day (QID) | ORAL | Status: DC | PRN
  Administered 2023-08-14: 325 mg via ORAL
  Filled 2023-08-13: qty 1

## 2023-08-13 MED ORDER — POTASSIUM CHLORIDE 10 MEQ/100ML IV SOLN
10.0000 meq | Freq: Once | INTRAVENOUS | Status: AC
  Administered 2023-08-13: 10 meq via INTRAVENOUS
  Filled 2023-08-13: qty 100

## 2023-08-13 MED ORDER — LOSARTAN POTASSIUM 25 MG PO TABS
25.0000 mg | ORAL_TABLET | Freq: Every day | ORAL | Status: DC
  Administered 2023-08-13 – 2023-08-17 (×5): 25 mg via ORAL
  Filled 2023-08-13 (×5): qty 1

## 2023-08-13 MED ORDER — AZITHROMYCIN 250 MG PO TABS
500.0000 mg | ORAL_TABLET | Freq: Once | ORAL | Status: AC
  Administered 2023-08-13: 500 mg via ORAL
  Filled 2023-08-13: qty 2

## 2023-08-13 MED ORDER — SODIUM CHLORIDE 0.9 % IV SOLN
500.0000 mg | INTRAVENOUS | Status: DC
  Administered 2023-08-14: 500 mg via INTRAVENOUS
  Filled 2023-08-13: qty 5

## 2023-08-13 MED ORDER — SODIUM CHLORIDE 0.9 % IV SOLN
2.0000 g | INTRAVENOUS | Status: DC
  Administered 2023-08-14 – 2023-08-15 (×2): 2 g via INTRAVENOUS
  Filled 2023-08-13 (×2): qty 20

## 2023-08-13 MED ORDER — ANASTROZOLE 1 MG PO TABS
1.0000 mg | ORAL_TABLET | Freq: Every day | ORAL | Status: DC
  Administered 2023-08-13 – 2023-08-17 (×5): 1 mg via ORAL
  Filled 2023-08-13 (×5): qty 1

## 2023-08-13 MED ORDER — APIXABAN 5 MG PO TABS
5.0000 mg | ORAL_TABLET | Freq: Two times a day (BID) | ORAL | Status: DC
  Administered 2023-08-13 – 2023-08-17 (×8): 5 mg via ORAL
  Filled 2023-08-13 (×7): qty 1
  Filled 2023-08-13: qty 2

## 2023-08-13 MED ORDER — SODIUM CHLORIDE 0.9 % IV SOLN
1.0000 g | Freq: Once | INTRAVENOUS | Status: AC
  Administered 2023-08-13: 1 g via INTRAVENOUS
  Filled 2023-08-13: qty 10

## 2023-08-13 MED ORDER — POTASSIUM CHLORIDE CRYS ER 20 MEQ PO TBCR
40.0000 meq | EXTENDED_RELEASE_TABLET | Freq: Once | ORAL | Status: AC
  Administered 2023-08-13: 40 meq via ORAL
  Filled 2023-08-13: qty 2

## 2023-08-13 MED ORDER — FUROSEMIDE 10 MG/ML IJ SOLN
40.0000 mg | Freq: Once | INTRAMUSCULAR | Status: AC
  Administered 2023-08-13: 40 mg via INTRAVENOUS
  Filled 2023-08-13: qty 4

## 2023-08-13 MED ORDER — POTASSIUM CHLORIDE CRYS ER 20 MEQ PO TBCR
40.0000 meq | EXTENDED_RELEASE_TABLET | Freq: Every day | ORAL | Status: DC
  Administered 2023-08-13 – 2023-08-15 (×3): 40 meq via ORAL
  Filled 2023-08-13 (×3): qty 2

## 2023-08-13 MED ORDER — METOPROLOL TARTRATE 25 MG PO TABS
25.0000 mg | ORAL_TABLET | Freq: Two times a day (BID) | ORAL | Status: DC
  Administered 2023-08-14 – 2023-08-17 (×7): 25 mg via ORAL
  Filled 2023-08-13 (×7): qty 1

## 2023-08-13 NOTE — ED Triage Notes (Signed)
 Pt c/o SOB/DOE for past week along with productive cough. Pt currently receiving Iron transfusions for anemia/cancer tx.

## 2023-08-13 NOTE — Assessment & Plan Note (Addendum)
New diagnosis 1 month ago.  Remains in atrial fibrillation with controlled rate.  Patient has elected against anticoagulation due to history of epistaxis.  CHA2DS2-VASc score is at least 4 suggesting a 4.8% yearly stroke risk.  Risk versus benefits discussed with patient.  Recommended he discuss further with his cardiology team. -Continue Toprol-XL -Continue discussions regarding anticoagulation 

## 2023-08-13 NOTE — Assessment & Plan Note (Addendum)
 Hemoglobin at baseline Patient has received prior iron infusions and transfusions Continue oral iron

## 2023-08-13 NOTE — ED Notes (Signed)
Called [facility] for transport; pt bed assignment ready

## 2023-08-13 NOTE — ED Provider Notes (Signed)
  EMERGENCY DEPARTMENT AT [facility] Provider Note   CSN: [id number] Arrival date & time: 08/13/23  1027     History  Chief Complaint  Patient presents with   Shortness of Breath    Mallory Stephens is a 89 y.o. female.  Patient is an 89 year old female with a past medical history of hypertension, CHF, A-fib on Eliquis , prior breast cancer now in remission and iron  deficiency anemia on iron  infusions presenting to the emergency department with shortness of breath.  Patient states she has had increasing shortness of breath over the last week.  She reports dyspnea on exertion as well as orthopnea and PND.  She also reports increased swelling in her legs.  She states that she has had a productive cough with some congestion.  Denies any sore throat.  Denies any fever.  She denies any associated chest pain.  Denies any recent history of VTE, any recent hospitalizations or surgery, reports she has been compliant with Eliquis .  The history is provided by the patient and a friend.  Shortness of Breath      Home Medications Prior to Admission medications   Medication Sig Start Date End Date Taking? Authorizing Provider  acetaminophen  (TYLENOL ) 325 MG tablet Take 325 mg by mouth every 6 (six) hours as needed for moderate pain.    [provider]  amLODipine  (NORVASC ) 5 MG tablet TAKE 1 TABLET (5 MG TOTAL) BY MOUTH DAILY. 10/08/22   Shlomo Wilbert SAUNDERS, MD  anastrozole  (ARIMIDEX ) 1 MG tablet Take 1 tablet (1 mg total) by mouth daily. 06/17/22   Gudena, Vinay, MD  anastrozole  (ARIMIDEX ) 1 MG tablet Take 1 mg by mouth daily. 08/24/22   [provider]  cholecalciferol  (VITAMIN D3) 25 MCG (1000 UNIT) tablet Take 1 tablet (1,000 Units total) by mouth daily. 11/01/20   Magrinat, Sandria BROCKS, MD  ELIQUIS  5 MG TABS tablet Take 5 mg by mouth 2 (two) times daily. 08/07/22   [provider]  ELIQUIS  5 MG TABS tablet TAKE 1 TABLET TWICE A DAY 08/10/23   Shlomo Wilbert SAUNDERS, MD   famotidine  (PEPCID ) 40 MG tablet Take 40 mg by mouth at bedtime. 12/29/18   [provider]  famotidine  (PEPCID ) 40 MG tablet Take 40 mg by mouth daily. 08/26/22   [provider]  ferrous gluconate  (FERGON) 324 MG tablet Take 1 tablet (324 mg total) by mouth 2 (two) times daily with a meal. 10/29/22   Drusilla Sabas GORMAN, MD  furosemide  (LASIX ) 20 MG tablet Take 1 tablet (20 mg total) by mouth every other day. 10/29/22   Drusilla Sabas GORMAN, MD  ibandronate  (BONIVA ) 150 MG tablet Take 150 mg by mouth every 30 (thirty) days. 08/25/22   [provider]  ibandronate  (BONIVA ) 150 MG tablet TAKE 1 TABLET BY MOUTH EVERY 30 DAYS ON THE 10TH DAY OF EACH MONTH IN THE MORNING WITH A FULL GLASS OF WATER,ON AN EMPTY STOMACH AND DO NOT TAKE ANYTHING ELSE BY MOUTH OR LIE DOWN FOR THE NEXT 30 MIN 07/27/23   Odean Potts, MD  losartan  (COZAAR ) 100 MG tablet TAKE 1 TABLET BY MOUTH EVERY DAY 07/09/15   Shlomo Wilbert SAUNDERS, MD  losartan  (COZAAR ) 25 MG tablet Take 1 tablet (25 mg total) by mouth daily. 10/29/22 10/29/23  Drusilla Sabas GORMAN, MD  metoprolol  tartrate (LOPRESSOR ) 25 MG tablet TAKE 1 TABLET TWICE A DAY 10/30/22   Shlomo Wilbert SAUNDERS, MD  metoprolol  tartrate (LOPRESSOR ) 25 MG tablet Take 25 mg by mouth  2 (two) times daily. 08/07/22   [provider]  Multiple Vitamin (MULTIVITAMIN WITH MINERALS) TABS tablet Take 1 tablet by mouth daily.    [provider]  PARoxetine  (PAXIL ) 10 MG tablet Take 10 mg by mouth every morning.    [provider]  PARoxetine  (PAXIL ) 10 MG tablet Take 10 mg by mouth daily. 08/26/22   [provider]  polyvinyl alcohol  (LIQUIFILM TEARS) 1.4 % ophthalmic solution Place 1-2 drops into both eyes every 6 (six) hours as needed for dry eyes.    [provider]  pravastatin  (PRAVACHOL ) 20 MG tablet Take 20 mg by mouth at bedtime.    [provider]  pravastatin  (PRAVACHOL ) 20 MG tablet Take 20 mg by mouth daily. 08/26/22   [provider]  solifenacin (VESICARE) 5 MG tablet Take 5 mg by mouth daily.    [provider]  solifenacin (VESICARE) 5 MG tablet Take 5 mg by mouth daily. 10/08/22   [provider]      Allergies    Sulfa antibiotics and Sulfa drugs cross reactors    Review of Systems   Review of Systems  Respiratory:  Positive for shortness of breath.     Physical Exam Updated Vital Signs BP (!) 150/96 (BP Location: Right Arm)   Pulse (!) 120   Temp 97.9 F (36.6 C) (Oral)   Resp (!) 22   SpO2 (!) 83%  Physical Exam Vitals and nursing note reviewed.  Constitutional:      General: She is not in acute distress.    Appearance: She is well-developed.  HENT:     Head: Normocephalic and atraumatic.     Mouth/Throat:     Mouth: Mucous membranes are moist.  Eyes:     Extraocular Movements: Extraocular movements intact.  Neck:     Vascular: JVD (~3cm above clavicle) present.  Cardiovascular:     Rate and Rhythm: Normal rate and regular rhythm.     Heart sounds: Normal heart sounds.  Pulmonary:     Effort: Pulmonary effort is normal.     Breath sounds: Rales (bilateral mid to lower lung fields) present.  Abdominal:     Palpations: Abdomen is soft.     Tenderness: There is no abdominal tenderness.  Musculoskeletal:        General: Normal range of motion.     Cervical back: Normal range of motion and neck supple.     Right lower leg: Edema (1+) present.     Left lower leg: Edema (1+) present.  Skin:    General: Skin is warm and dry.  Neurological:     General: No focal deficit present.     Mental Status: She is alert and oriented to person, place, and time.  Psychiatric:        Mood and Affect: Mood normal.        Behavior: Behavior normal.     ED Results / Procedures / Treatments   Labs (all labs ordered are listed, but only abnormal results are displayed) Labs Reviewed  COMPREHENSIVE METABOLIC PANEL - Abnormal; Notable for the following components:      Result Value    Potassium 2.9 (*)    Glucose, Bld 127 (*)    Calcium  8.7 (*)    Total Protein 5.8 (*)    AST 11 (*)    All other components within normal limits  CBC WITH DIFFERENTIAL/PLATELET - Abnormal; Notable for the following components:   WBC 11.4 (*)  RBC 3.23 (*)    Hemoglobin 8.7 (*)    HCT 29.2 (*)    MCHC 29.8 (*)    Neutro Abs 8.3 (*)    All other components within normal limits  BRAIN NATRIURETIC PEPTIDE - Abnormal; Notable for the following components:   B Natriuretic Peptide 897.0 (*)    All other components within normal limits  RESP PANEL BY RT-PCR (RSV, FLU A&B, COVID)  RVPGX2  TROPONIN I (HIGH SENSITIVITY)    EKG None  Radiology DG Chest 2 View Result Date: 08/13/2023 CLINICAL DATA:  Shortness of breath. EXAM: CHEST - 2 VIEW COMPARISON:  April 28, 2017.  April 06, 2022. FINDINGS: Stable cardiomegaly. Mild reticular densities are noted throughout both lungs which may represent scarring or possibly atypical inflammation or edema. Left upper lobe airspace opacity is noted concerning for pneumonia. Old lower thoracic compression fracture is noted. IMPRESSION: Mild reticular densities are noted throughout both lungs which may represent scarring or possibly atypical inflammation or edema. Left upper lobe airspace opacity is noted concerning for pneumonia. Followup PA and lateral chest X-ray is recommended in 3-4 weeks following trial of antibiotic therapy to ensure resolution and exclude underlying malignancy. Aortic Atherosclerosis (ICD10-I70.0). Electronically Signed   By: Lynwood Landy Raddle M.D.   On: 08/13/2023 11:28    Procedures Procedures    Medications Ordered in ED Medications  albuterol  (VENTOLIN  HFA) 108 (90 Base) MCG/ACT inhaler 2 puff (has no administration in time range)  potassium chloride  10 mEq in 100 mL IVPB (has no administration in time range)  cefTRIAXone  (ROCEPHIN ) 1 g in sodium chloride  0.9 % 100 mL IVPB (0 g Intravenous Stopped 08/13/23 1308)   azithromycin  (ZITHROMAX ) tablet 500 mg (500 mg Oral Given 08/13/23 1207)  potassium chloride  SA (KLOR-CON  M) CR tablet 40 mEq (40 mEq Oral Given 08/13/23 1240)  furosemide  (LASIX ) injection 40 mg (40 mg Intravenous Given 08/13/23 1308)    ED Course/ Medical Decision Making/ A&P Clinical Course as of 08/13/23 1349  Thu Aug 13, 2023  1134 CXR concerning for pneumonia, possible edema.  [VK]  1230 K 2.9, will be repleted. Initial troponin negative, symptoms ongoing for several days so single troponin is sufficient. [VK]  1251 BNP elevated will be given IV lasix . Will need amb pulse ox to determine disposition. [VK]  1346 Patient desatted to 83% on room air upon ambulation and became acutely short of breath. O2 improved from 90-92% on room air at rest. Patient recommended admission for further management of her hypoxia in the setting of pneumonia and CHF. [VK]    Clinical Course User Index [VK] Kingsley, Darielys Giglia K, DO                                 Medical Decision Making This patient presents to the ED with chief complaint(s) of shortness of breath with pertinent past medical history of CHF, A fib on Eliquis , prior breast cancer in remission, iron  deficiency anemia on iron  infusions which further complicates the presenting complaint. The complaint involves an extensive differential diagnosis and also carries with it a high risk of complications and morbidity.    The differential diagnosis includes ACS, arrhythmia, anemia, pneumonia, pneumothorax, pulmonary edema, pleural effusion, viral syndrome  Additional history obtained: Additional history obtained from family Records reviewed previous admission documents  ED Course and Reassessment: On patient's arrival she was hemodynamically stable with mildly low room air pulse ox, otherwise in no  acute distress.  Does appear clinically volume overloaded in addition with productive cough and congestion, will have workup performed to evaluate for CHF  exacerbation versus infectious etiology, low risk for PE as she is compliant on her Eliquis .  She will be closely reassessed.  Independent labs interpretation:  The following labs were independently interpreted: elevated BNP, mild leukocytosis, hypokalemia  Independent visualization of imaging: - I independently visualized the following imaging with scope of interpretation limited to determining acute life threatening conditions related to emergency care: CXR, which revealed LUL pneumonia with likely pulmonary edema  Consultation: - Consulted or discussed management/test interpretation w/ external professional: hospitalist  Consideration for admission or further workup: patient requires admission for treatment of pneumonia and CHF Social Determinants of health: N/A    Amount and/or Complexity of Data Reviewed Labs: ordered. Radiology: ordered.  Risk Prescription drug management. Decision regarding hospitalization.          Final Clinical Impression(s) / ED Diagnoses Final diagnoses:  Pneumonia of left upper lobe due to infectious organism  Hypokalemia  Acute on chronic congestive heart failure, unspecified heart failure type [facility])    Rx / DC Orders ED Discharge Orders     None         Kingsley, Delshawn Stech K, DO 08/13/23 1349

## 2023-08-13 NOTE — H&P (Signed)
 History and Physical    Patient: Mallory Stephens FMW:[id number] DOB: May 24, 1933 DOA: 08/13/2023 DOS: the patient was seen and examined on 08/13/2023 PCP: Verena Mems, MD  Patient coming from: Home  Chief Complaint:  Chief Complaint  Patient presents with   Shortness of Breath   HPI: Mallory Stephens is a 89 y.o. female with medical history significant of hypertension, dCHF (EF 55-60%, 10/2022), permanent atrial fibrillation on Eliquis , IDA on iron  infusions and prior history of breast cancer admitted from [facility] emergency department with a 1 week history of of shortness of breath on exertion, orthopnea and bilateral lower extremity swelling with working diagnosis of CHF exacerbation and pneumonia.  She denied chest pain, cough, fever or chills.  Denies lower extremity pain.  Compliant with Eliquis . ED course and data review: Afebrile intermittently tachycardic up to 120, tachypneic in the low 20s with normal BP 150/96.  O2 sats in the high 80s requiring 2 L to maintain sats in the low to mid 90s. Pertinent findings on workup include the following: Troponin 11 and BNP 897 WBC 11,000 with procalcitonin less than 0.10 negative respiratory viral panel Hemoglobin 8.7 which is her baseline Potassium 2.9 EKG Pending Chest x-ray consistent with atypical inflammation versus pneumonia further detailed as follows: IMPRESSION: Mild reticular densities are noted throughout both lungs which may represent scarring or possibly atypical inflammation or edema. Left upper lobe airspace opacity is noted concerning for pneumonia. Followup PA and lateral chest X-ray is recommended in 3-4 weeks following trial of antibiotic therapy to ensure resolution and exclude underlying malignancy  Patient treated with IV Lasix  40 mg, Rocephin  and azithromycin  and given a dose of oral potassium 40 mill equivalent as well as 10 mEq IV  Hospitalist consulted for admission.  Review of Systems: As mentioned in the  history of present illness. All other systems reviewed and are negative. Past Medical History:  Diagnosis Date   Breast cancer (HCC) 07/24/2020   Dyslipidemia    Dysrhythmia    atrial fibrillation   Edema extremities 09/03/2016   Family history of breast cancer    Family history of prostate cancer    Fatty liver    GERD (gastroesophageal reflux disease)    History of basal cell carcinoma    Hypertension    Lymphocytosis    followed by Dr. Sherrod   Multinodular goiter    Osteoarthritis    Permanent atrial fibrillation (HCC)    atrial fibrillation s/p DCCV 3/10 and 6/10   PONV (postoperative nausea and vomiting)    PUD (peptic ulcer disease)    Pulmonary nodules    followed  by Dr. Verena   UTI (urinary tract infection) 04/2017   Past Surgical History:  Procedure Laterality Date   aspiration of cyst  1978   BREAST LUMPECTOMY Left 09/2020   BREAST LUMPECTOMY WITH RADIOACTIVE SEED LOCALIZATION Left 09/06/2020   Procedure: LEFT BREAST LUMPECTOMY WITH RADIOACTIVE SEED LOCALIZATION;  Surgeon: Aron Shoulders, MD;  Location: [facility] OR;  Service: General;  Laterality: Left;   BREAST SURGERY  1962   right breast for benign lesion   BREAST SURGERY  1971   bilateral lumpectomy for beingn lesions   CARDIOVERSION  07/22/2011   Procedure: CARDIOVERSION;  Surgeon: Wilbert JONELLE Bihari, MD;  Location: [facility] OR;  Service: Cardiovascular;  Laterality: N/A;   CARDIOVERSION  09/02/2011   Procedure: CARDIOVERSION;  Surgeon: Wilbert JONELLE Bihari, MD;  Location: [facility] OR;  Service: Cardiovascular;  Laterality: N/A;   cataract  cataract removal-bilateral   CHOLECYSTECTOMY  2003   CYSTOSCOPY     DILATION AND CURETTAGE OF UTERUS  1979   JOINT REPLACEMENT  2002   Right knee   JOINT REPLACEMENT  2007   left knee   Social History:  reports that she has quit smoking. Her smoking use included cigarettes. She has a 30 pack-year smoking history. She has never used smokeless tobacco. She reports current alcohol  use. She  reports that she does not use drugs.  Allergies  Allergen Reactions   Sulfa Antibiotics    Sulfa Drugs Cross Reactors Rash    Family History  Problem Relation Age of Onset   Stroke Mother    Stroke Father    Breast cancer Sister        dx 69s   Arthritis Sister    Scoliosis Sister    Heart disease Brother    Prostate cancer Brother        dx >50, metastatic   Prostate cancer Brother        dx >50, metastatic   Prostate cancer Nephew        dx <50   Breast cancer Niece        dx <50   Breast cancer Niece        dx <50   Autoimmune disease Neg Hx     Prior to Admission medications   Medication Sig Start Date End Date Taking? Authorizing Provider  acetaminophen  (TYLENOL ) 325 MG tablet Take 325 mg by mouth every 6 (six) hours as needed for moderate pain.   Yes [provider]  amLODipine  (NORVASC ) 5 MG tablet TAKE 1 TABLET (5 MG TOTAL) BY MOUTH DAILY. 10/08/22  Yes Turner, Wilbert SAUNDERS, MD  anastrozole  (ARIMIDEX ) 1 MG tablet Take 1 tablet (1 mg total) by mouth daily. 06/17/22  Yes Gudena, Vinay, MD  cholecalciferol  (VITAMIN D3) 25 MCG (1000 UNIT) tablet Take 1 tablet (1,000 Units total) by mouth daily. 11/01/20  Yes Magrinat, Sandria BROCKS, MD  ELIQUIS  5 MG TABS tablet TAKE 1 TABLET TWICE A DAY 08/10/23  Yes Turner, Wilbert SAUNDERS, MD  famotidine  (PEPCID ) 40 MG tablet Take 40 mg by mouth at bedtime. 12/29/18  Yes [provider]  furosemide  (LASIX ) 20 MG tablet Take 1 tablet (20 mg total) by mouth every other day. 10/29/22  Yes Lama, Sabas RAMAN, MD  ibandronate  (BONIVA ) 150 MG tablet TAKE 1 TABLET BY MOUTH EVERY 30 DAYS ON THE 10TH DAY OF EACH MONTH IN THE MORNING WITH A FULL GLASS OF WATER,ON AN EMPTY STOMACH AND DO NOT TAKE ANYTHING ELSE BY MOUTH OR LIE DOWN FOR THE NEXT 30 MIN 07/27/23  Yes Odean Potts, MD  losartan  (COZAAR ) 25 MG tablet Take 1 tablet (25 mg total) by mouth daily. 10/29/22 10/29/23 Yes Drusilla Sabas RAMAN, MD  metoprolol  tartrate (LOPRESSOR ) 25 MG tablet TAKE 1 TABLET TWICE  A DAY 10/30/22  Yes Turner, Wilbert SAUNDERS, MD  PARoxetine  (PAXIL ) 10 MG tablet Take 10 mg by mouth every morning.   Yes [provider]  polyvinyl alcohol  (LIQUIFILM TEARS) 1.4 % ophthalmic solution Place 1-2 drops into both eyes every 6 (six) hours as needed for dry eyes.   Yes [provider]  pravastatin  (PRAVACHOL ) 20 MG tablet Take 20 mg by mouth at bedtime.   Yes [provider]  solifenacin (VESICARE) 5 MG tablet Take 5 mg by mouth daily. 10/08/22  Yes [provider]  ferrous gluconate  (FERGON) 324 MG tablet Take 1 tablet (324  mg total) by mouth 2 (two) times daily with a meal. Patient not taking: Reported on 08/13/2023 10/29/22   Drusilla Sabas RAMAN, MD  losartan  (COZAAR ) 100 MG tablet TAKE 1 TABLET BY MOUTH EVERY DAY Patient not taking: Reported on 08/13/2023 07/09/15   Shlomo Wilbert SAUNDERS, MD    Physical Exam: Vitals:   08/13/23 1542 08/13/23 1630 08/13/23 1700 08/13/23 1835  BP:  (!) 162/72 (!) 144/55 (!) 174/61  Pulse:  94 95 100  Resp:  (!) 23 (!) 26 19  Temp: 97.8 F (36.6 C)   97.8 F (36.6 C)  TempSrc: Oral   Oral  SpO2:  (!) 89% 95% 92%  Weight:    60.1 kg  Height:    5' 7 (1.702 m)   Physical Exam Vitals and nursing note reviewed.  Constitutional:      General: She is not in acute distress. HENT:     Head: Normocephalic and atraumatic.  Cardiovascular:     Rate and Rhythm: Normal rate and regular rhythm.     Heart sounds: Normal heart sounds.  Pulmonary:     Effort: Tachypnea present.     Breath sounds: Normal breath sounds.  Abdominal:     Palpations: Abdomen is soft.     Tenderness: There is no abdominal tenderness.  Musculoskeletal:     Right lower leg: Edema present.     Left lower leg: Edema present.  Neurological:     Mental Status: Mental status is at baseline.     Data Reviewed: Relevant notes from primary care and specialist visits, past discharge summaries as available in EHR, including Care Everywhere. Prior diagnostic  testing as pertinent to current admission diagnoses Updated medications and problem lists for reconciliation ED course, including vitals, labs, imaging, treatment and response to treatment Triage notes, nursing and pharmacy notes and ED provider's notes Notable results as noted in HPI   Assessment and Plan: * Acute on chronic heart failure with preserved ejection fraction (HFpEF) (HCC) Pulmonary hypertension on prior echocardiogram Clinically fluid overloaded, with BNP 897 and possible edema on chest x-ray IV Lasix  Continue home GDMT with metoprolol , losartan  Will update echo-last checked in March 2024 with a EF 55 to 60% Daily weights with intake and output monitoring  Acute respiratory failure with hypoxia secondary to CAP vs/+ new CHF exacerbation Secondary primarily to CHF possible pneumonia  Continue supplemental oxygen and wean as tolerated Please see specific problem for treatment of CHF and pneumonia  CAP (community acquired pneumonia) Chest x-ray with possible left upper lobe airspace opacity WBC 11,000, respiratory viral panel negative Continue Rocephin  and azithromycin  Will get procalcitonin to assist in de-escalating antibiotics if less than 0.10 Antitussives Incentive spirometer Albuterol  as needed if wheezing  Chronic anemia Hemoglobin at baseline Patient has received prior iron  infusions and transfusions Continue oral iron    Hypokalemia Received oral and IV repletion in the ED Continue to monitor and correct as needed  Permanent atrial fibrillation (HCC) Long-term anticoagulant use No acute issues noted Continue metoprolol  and Eliquis   Essential hypertension Continue amlodipine , losartan  and metoprolol   Malignant neoplasm of upper-inner quadrant of left breast in female, estrogen receptor positive (HCC) S/p left lumpectomy, opted against radiation Continue anastrozole        Advance Care Planning:   Code Status: Prior   Consults: none  Family  Communication: none  Severity of Illness: The appropriate patient status for this patient is INPATIENT. Inpatient status is judged to be reasonable and necessary in order to provide the required  intensity of service to ensure the patient's safety. The patient's presenting symptoms, physical exam findings, and initial radiographic and laboratory data in the context of their chronic comorbidities is felt to place them at high risk for further clinical deterioration. Furthermore, it is not anticipated that the patient will be medically stable for discharge from the hospital within 2 midnights of admission.   * I certify that at the point of admission it is my clinical judgment that the patient will require inpatient hospital care spanning beyond 2 midnights from the point of admission due to high intensity of service, high risk for further deterioration and high frequency of surveillance required.*  Author: Delayne LULLA Solian, MD 08/13/2023 7:42 PM  For on call review [email or web address].

## 2023-08-13 NOTE — Assessment & Plan Note (Addendum)
 Renal function at the time of discharge has a serum cr of 1.2 with K at 4,4 and serum bicarbonate at 27  Na 136   Plan to continue oral diuretic therapy.

## 2023-08-13 NOTE — Assessment & Plan Note (Deleted)
 Secondary primarily to CHF possible pneumonia  Continue supplemental oxygen and wean as tolerated Please see specific problem for treatment of CHF and pneumonia

## 2023-08-13 NOTE — Progress Notes (Signed)
 Plan of Care Note for accepted transfer  Patient: Mallory Stephens    FMW:[id number]  DOA: 08/13/2023     Facility requesting transfer: [facility] Requesting Provider: Dr. Ellouise Reason for transfer: Acute CHF and Pneumonia Facility course:   89 year old female with past medical history of hypertension, congestive heart failure, atrial fibrillation on Eliquis , deficiency anemia on iron  infusions and prior history of breast cancer presented to [facility] emergency department with complaints of shortness of breath and bilateral lower extremity swelling.  Upon evaluation in the emergency department patient was found to be hypoxic and tachycardic.  Patient was found to have a markedly elevated BNP of 897.  Chest x-ray revealed patchy infiltrates in addition to a more dense left upper lobe infiltrate.  EDP felt the patient exhibited clinical features of both CHF and pneumonia.  40 mg of intravenous Lasix  as well as ceftriaxone  and azithromycin  were administered.  Patient was also given 50 mill equivalents of potassium for potassium of 2.9.  Patient is excepted to a medical telemetry bed targeting [name] with the checkbox has been checked for the patient to go to either campus upon bed availability.  Plan of care: The patient is accepted for admission to Telemetry unit, at [facility] or [facility].    Author: Zachary JINNY Ba, MD  08/13/2023  Check [email or web address] for on-call coverage.  Nursing staff, Please call [facility] Admits & Consults System-Wide number on Amion as soon as patient's arrival, so appropriate admitting provider can evaluate the pt.

## 2023-08-13 NOTE — Hospital Course (Addendum)
 Mallory Stephens was admitted to the hospital with the working diagnosis of heart failure exacerbation in the setting of community acquired pneumonia.   89 year old female with past medical history of hypertension, congestive heart failure, atrial fibrillation, iron  deficiency anemia and prior history of breast cancer presented to [facility] emergency department with complaints of shortness of breath orthopnea and bilateral lower extremity swelling. On her initial physical examination her blood pressure was 150/96, HR 120, RR 20 and 02 saturation 80% on room air, that improved to mid 90's on 2 L/min of supplemental 02 per [place].  Positive tachypnea and increased work of breathing, bilateral rales, with no wheezing, heart with S1 and S2 present, irregularly irregular with no gallops, abdomen with no distention, positive lower extremity edema.   Na 142, K 2,9 Cl 105 bicarbonate 29, glucose 127, bun 17 cr 0,80 AST 11, ALT 8  BNP 897  High sensitive troponin 11  Procalcitonin <0.10  Wbc 11,4 hgb 8,7 plt 368  Sars covid 19 negative  Influenza negative   Chest radiograph with mild cardiomegaly, bilateral hilar vascular congestion, positive patchy infiltrate left upper lobe.   01/10 continue diuresis.  01/11 improved volume status, follow up chest radiograph with persistent left upper lobe infiltrate.  01/12 clinically improving, pending PT and OT evaluation, she may need SNF.  01/13 continue to improve, now on room air, plan for discharge home and follow up as outpatient.

## 2023-08-13 NOTE — Assessment & Plan Note (Addendum)
 S/p left lumpectomy, opted against radiation Continue anastrozole

## 2023-08-13 NOTE — Assessment & Plan Note (Addendum)
 On losartan and metoprolol

## 2023-08-13 NOTE — Assessment & Plan Note (Addendum)
 Post partial diuresis chest radiograph with persistent left upper lobe infiltrate.  Positive leukocytosis on admission.   Patient completed antibiotic therapy in the  hospital with macrolide and cephalosporin.

## 2023-08-13 NOTE — Assessment & Plan Note (Addendum)
 Echocardiogram with preserved LV systolic function with EF 55 to 60%, mild LVH, RV systolic function preserved, LA with moderate dilatation and RA with severe dilatation, mild MR, mild MS, mild AS, mild TR   Patient was placed on furosemide  for diuresis, negative fluid balance was achieved, - 4.274 ml with significant improvement in her symptoms.  Patient lost about 5 kg during this hospitalization.   Plan to continue losartan  and metoprolol  No SGLT 2 inh due to history of urinary tract infections.  Will change furosemide  to daily dosing, 40 mg.   Acute cardiogenic pulmonary edema, with acute hypoxemic respiratory failure.  Oxygenation has improved, at the time of her discharge her 02 saturation 99% on 2 L/min per [place].

## 2023-08-14 ENCOUNTER — Observation Stay (HOSPITAL_BASED_OUTPATIENT_CLINIC_OR_DEPARTMENT_OTHER): Payer: Medicare HMO

## 2023-08-14 ENCOUNTER — Inpatient Hospital Stay (HOSPITAL_COMMUNITY): Payer: Medicare HMO

## 2023-08-14 DIAGNOSIS — Z79811 Long term (current) use of aromatase inhibitors: Secondary | ICD-10-CM | POA: Diagnosis not present

## 2023-08-14 DIAGNOSIS — Z8261 Family history of arthritis: Secondary | ICD-10-CM | POA: Diagnosis not present

## 2023-08-14 DIAGNOSIS — J189 Pneumonia, unspecified organism: Secondary | ICD-10-CM

## 2023-08-14 DIAGNOSIS — Z803 Family history of malignant neoplasm of breast: Secondary | ICD-10-CM | POA: Diagnosis not present

## 2023-08-14 DIAGNOSIS — K760 Fatty (change of) liver, not elsewhere classified: Secondary | ICD-10-CM | POA: Diagnosis present

## 2023-08-14 DIAGNOSIS — Z7901 Long term (current) use of anticoagulants: Secondary | ICD-10-CM | POA: Diagnosis not present

## 2023-08-14 DIAGNOSIS — I509 Heart failure, unspecified: Secondary | ICD-10-CM | POA: Insufficient documentation

## 2023-08-14 DIAGNOSIS — Z882 Allergy status to sulfonamides status: Secondary | ICD-10-CM | POA: Diagnosis not present

## 2023-08-14 DIAGNOSIS — I10 Essential (primary) hypertension: Secondary | ICD-10-CM

## 2023-08-14 DIAGNOSIS — I4821 Permanent atrial fibrillation: Secondary | ICD-10-CM | POA: Diagnosis present

## 2023-08-14 DIAGNOSIS — D509 Iron deficiency anemia, unspecified: Secondary | ICD-10-CM | POA: Diagnosis present

## 2023-08-14 DIAGNOSIS — E876 Hypokalemia: Secondary | ICD-10-CM | POA: Diagnosis present

## 2023-08-14 DIAGNOSIS — K219 Gastro-esophageal reflux disease without esophagitis: Secondary | ICD-10-CM | POA: Diagnosis present

## 2023-08-14 DIAGNOSIS — I5030 Unspecified diastolic (congestive) heart failure: Secondary | ICD-10-CM

## 2023-08-14 DIAGNOSIS — I272 Pulmonary hypertension, unspecified: Secondary | ICD-10-CM | POA: Diagnosis present

## 2023-08-14 DIAGNOSIS — I480 Paroxysmal atrial fibrillation: Secondary | ICD-10-CM

## 2023-08-14 DIAGNOSIS — D649 Anemia, unspecified: Secondary | ICD-10-CM | POA: Diagnosis not present

## 2023-08-14 DIAGNOSIS — Z96653 Presence of artificial knee joint, bilateral: Secondary | ICD-10-CM | POA: Diagnosis present

## 2023-08-14 DIAGNOSIS — Z1152 Encounter for screening for COVID-19: Secondary | ICD-10-CM | POA: Diagnosis not present

## 2023-08-14 DIAGNOSIS — Z823 Family history of stroke: Secondary | ICD-10-CM | POA: Diagnosis not present

## 2023-08-14 DIAGNOSIS — I5033 Acute on chronic diastolic (congestive) heart failure: Secondary | ICD-10-CM | POA: Diagnosis present

## 2023-08-14 DIAGNOSIS — Z87891 Personal history of nicotine dependence: Secondary | ICD-10-CM | POA: Diagnosis not present

## 2023-08-14 DIAGNOSIS — Z85828 Personal history of other malignant neoplasm of skin: Secondary | ICD-10-CM | POA: Diagnosis not present

## 2023-08-14 DIAGNOSIS — Z853 Personal history of malignant neoplasm of breast: Secondary | ICD-10-CM | POA: Diagnosis not present

## 2023-08-14 DIAGNOSIS — Z79899 Other long term (current) drug therapy: Secondary | ICD-10-CM | POA: Diagnosis not present

## 2023-08-14 DIAGNOSIS — Z8249 Family history of ischemic heart disease and other diseases of the circulatory system: Secondary | ICD-10-CM | POA: Diagnosis not present

## 2023-08-14 DIAGNOSIS — J9601 Acute respiratory failure with hypoxia: Secondary | ICD-10-CM | POA: Diagnosis present

## 2023-08-14 DIAGNOSIS — I110 Hypertensive heart disease with heart failure: Secondary | ICD-10-CM | POA: Diagnosis present

## 2023-08-14 DIAGNOSIS — E785 Hyperlipidemia, unspecified: Secondary | ICD-10-CM | POA: Diagnosis present

## 2023-08-14 LAB — ECHOCARDIOGRAM COMPLETE
AR max vel: 1.46 cm2
AV Area VTI: 1.55 cm2
AV Area mean vel: 1.46 cm2
AV Mean grad: 11 mm[Hg]
AV Peak grad: 16.4 mm[Hg]
Ao pk vel: 2.03 m/s
Area-P 1/2: 4.71 cm2
Height: 67 in
MV VTI: 2.06 cm2
S' Lateral: 2.7 cm
Weight: 2112.89 [oz_av]

## 2023-08-14 LAB — BASIC METABOLIC PANEL
Anion gap: 11 (ref 5–15)
BUN: 11 mg/dL (ref 8–23)
CO2: 30 mmol/L (ref 22–32)
Calcium: 8.5 mg/dL — ABNORMAL LOW (ref 8.9–10.3)
Chloride: 100 mmol/L (ref 98–111)
Creatinine, Ser: 0.87 mg/dL (ref 0.44–1.00)
GFR, Estimated: >60 mL/min (ref >60)
Glucose, Bld: 112 mg/dL — ABNORMAL HIGH (ref 70–99)
Potassium: 3.5 mmol/L (ref 3.5–5.1)
Sodium: 141 mmol/L (ref 135–145)

## 2023-08-14 LAB — CBC
HCT: 27.6 % — ABNORMAL LOW (ref 36.0–46.0)
Hemoglobin: 8.3 g/dL — ABNORMAL LOW (ref 12.0–15.0)
MCH: 26.9 pg (ref 26.0–34.0)
MCHC: 30.1 g/dL (ref 30.0–36.0)
MCV: 89.6 fL (ref 80.0–100.0)
Platelets: 371 10*3/uL (ref 150–400)
RBC: 3.08 MIL/uL — ABNORMAL LOW (ref 3.87–5.11)
RDW: 14.6 % (ref 11.5–15.5)
WBC: 12.7 10*3/uL — ABNORMAL HIGH (ref 4.0–10.5)
nRBC: 0 % (ref 0.0–0.2)

## 2023-08-14 MED ORDER — AZITHROMYCIN 500 MG PO TABS
250.0000 mg | ORAL_TABLET | Freq: Every day | ORAL | Status: AC
  Administered 2023-08-15 – 2023-08-17 (×3): 250 mg via ORAL
  Filled 2023-08-14 (×3): qty 1

## 2023-08-14 MED ORDER — FUROSEMIDE 10 MG/ML IJ SOLN
60.0000 mg | Freq: Two times a day (BID) | INTRAMUSCULAR | Status: DC
  Administered 2023-08-14 – 2023-08-15 (×2): 60 mg via INTRAVENOUS
  Filled 2023-08-14 (×2): qty 6

## 2023-08-14 MED ORDER — POTASSIUM CHLORIDE CRYS ER 20 MEQ PO TBCR
40.0000 meq | EXTENDED_RELEASE_TABLET | Freq: Once | ORAL | Status: AC
  Administered 2023-08-14: 40 meq via ORAL
  Filled 2023-08-14: qty 2

## 2023-08-14 MED ORDER — EMPAGLIFLOZIN 10 MG PO TABS
10.0000 mg | ORAL_TABLET | Freq: Every day | ORAL | Status: DC
  Administered 2023-08-14 – 2023-08-16 (×3): 10 mg via ORAL
  Filled 2023-08-14 (×3): qty 1

## 2023-08-14 NOTE — Progress Notes (Signed)
 Heart Failure Navigator Progress Note  Assessed for Heart & Vascular TOC clinic readiness.  Patient does not meet criteria due to EF 55-60%, has a scheduled [facility] appointment on 09/08/2023. .   Navigator will sign off at this time.   Stephane Haddock, BSN, Scientist, Clinical (histocompatibility And Immunogenetics) Only

## 2023-08-14 NOTE — Plan of Care (Signed)
  Problem: Education: Goal: Knowledge of General Education information will improve Description: Including pain rating scale, medication(s)/side effects and non-pharmacologic comfort measures Outcome: Progressing   Problem: Health Behavior/Discharge Planning: Goal: Ability to manage health-related needs will improve Outcome: Progressing   Problem: Clinical Measurements: Goal: Ability to maintain clinical measurements within normal limits will improve Outcome: Progressing Goal: Will remain free from infection Outcome: Progressing   Problem: Activity: Goal: Risk for activity intolerance will decrease Outcome: Progressing   Problem: Nutrition: Goal: Adequate nutrition will be maintained Outcome: Progressing   Problem: Pain Management: Goal: General experience of comfort will improve Outcome: Progressing

## 2023-08-14 NOTE — Progress Notes (Signed)
 Progress Note   Patient: Mallory Stephens FMW:[id number] DOB: [birth year] DOA: 08/13/2023     0 DOS: the patient was seen and examined on 08/14/2023   Brief hospital course: Mallory Stephens was admitted to the hospital with the working diagnosis of heart failure exacerbation.   89 year old female with past medical history of hypertension, congestive heart failure, atrial fibrillation, iron  deficiency anemia and prior history of breast cancer presented to [facility] emergency department with complaints of shortness of breath and bilateral lower extremity swelling.  Upon evaluation in the emergency department patient was found to be hypoxic and tachycardic.  Patient was found to have a markedly elevated BNP of 897.  Chest x-ray revealed patchy infiltrates in addition to a more dense left upper lobe infiltrate.  EDP felt the patient exhibited clinical features of both CHF and pneumonia.  40 mg of intravenous Lasix  as well as ceftriaxone  and azithromycin  were administered.  Patient was also given 50 mill equivalents of potassium for potassium of 2.9.  Patient is excepted to a medical telemetry bed targeting Mallory Stephens with the checkbox has been checked for the patient to go to either campus upon bed availability.  Assessment and Plan: * Acute on chronic diastolic CHF (congestive heart failure) (HCC) Echocardiogram with preserved LV systolic function with EF 55 to 60%, mild LVH, RV systolic function preserved, LA with moderate dilatation and RA with severe dilatation, mild MR, mild MS, mild AS, mild TR   Urine output is  2,700  Systolic blood pressure 150's   Continue with volume overload, will increase furosemide  to 60 mg IV bid Discontinue amlodipine  Continue losartan  and metoprolol .  Add SGLT 2 inh.   Acute cardiogenic pulmonary edema, continue diuresis.     CAP (community acquired pneumonia) Post partial diuresis chest radiograph with persistent left upper lobe infiltrate.  Positive  leukocytosis on admission.   Plan to continue antibiotic therapy with azithromycin  and ceftriaxone .  Follow up cell count and cultures.  Bronchodilator therapy.   Chronic anemia Hemoglobin at baseline Patient has received prior iron  infusions and transfusions Continue oral iron    Hypokalemia Renal function with serum cr at 0,87 with K at 3,5 and serum bicarbonate at 30  Na 141  Plan to add 40 meq Kcl x 2 doses and follow up renal function in am.  Check Mg.   Permanent atrial fibrillation (HCC) Long-term anticoagulant use No acute issues noted Continue metoprolol  and Eliquis   Essential hypertension On losartan  and metoprolol  Aggressive diuresis.   Malignant neoplasm of upper-inner quadrant of left breast in female, estrogen receptor positive (HCC) S/p left lumpectomy, opted against radiation Continue anastrozole          Subjective: Patient feeling better but not back to her baseline, continue to have dyspnea and edema   Physical Exam: Vitals:   08/14/23 0718 08/14/23 0758 08/14/23 0845 08/14/23 1158  BP: 102/63  123/67 (!) 155/61  Pulse: (!) 104  (!) 104 82  Resp: 19 19 18 15   Temp: 97.9 F (36.6 C)  (!) 97.5 F (36.4 C) 98.2 F (36.8 C)  TempSrc: Oral  Oral Oral  SpO2: 100% 100% 98% 96%  Weight:      Height:       Neurology awake and alert ENT with mild pallor Cardiovascular with S1 and S2 present, irregularly irregular with mild systolic murmur at the apex, with no gallops Positive JVD Positive lower extremity edema pitting  Respiratory with rales bilaterally with no wheezing or rhonchi Abdomen with no distention  Data Reviewed:    Family Communication: I spoke with patient's nice at the bedside, we talked in detail about patient's condition, plan of care and prognosis and all questions were addressed.   Disposition: Status is: Inpatient Remains inpatient appropriate because: IV diuresis   Planned Discharge Destination:  Home  Author: Elidia Toribio Furnace, MD 08/14/2023 3:07 PM  For on call review [email or web address].

## 2023-08-14 NOTE — Plan of Care (Signed)
  Problem: Clinical Measurements: Goal: Diagnostic test results will improve Outcome: Progressing Goal: Cardiovascular complication will be avoided Outcome: Progressing   Problem: Activity: Goal: Risk for activity intolerance will decrease Outcome: Progressing   Problem: Nutrition: Goal: Adequate nutrition will be maintained Outcome: Progressing   Problem: Coping: Goal: Level of anxiety will decrease Outcome: Progressing   Problem: Elimination: Goal: Will not experience complications related to urinary retention Outcome: Progressing   Problem: Pain Management: Goal: General experience of comfort will improve Outcome: Progressing   Problem: Safety: Goal: Ability to remain free from injury will improve Outcome: Progressing   Problem: Activity: Goal: Capacity to carry out activities will improve Outcome: Progressing   Problem: Cardiac: Goal: Ability to achieve and maintain adequate cardiopulmonary perfusion will improve Outcome: Progressing

## 2023-08-15 DIAGNOSIS — C50212 Malignant neoplasm of upper-inner quadrant of left female breast: Secondary | ICD-10-CM

## 2023-08-15 DIAGNOSIS — E876 Hypokalemia: Secondary | ICD-10-CM | POA: Diagnosis not present

## 2023-08-15 DIAGNOSIS — I10 Essential (primary) hypertension: Secondary | ICD-10-CM

## 2023-08-15 DIAGNOSIS — J189 Pneumonia, unspecified organism: Secondary | ICD-10-CM | POA: Diagnosis not present

## 2023-08-15 DIAGNOSIS — D649 Anemia, unspecified: Secondary | ICD-10-CM | POA: Diagnosis not present

## 2023-08-15 DIAGNOSIS — I5033 Acute on chronic diastolic (congestive) heart failure: Secondary | ICD-10-CM | POA: Diagnosis not present

## 2023-08-15 DIAGNOSIS — Z170 Estrogen receptor positive status [ER+]: Secondary | ICD-10-CM

## 2023-08-15 LAB — CBC
HCT: 28.5 % — ABNORMAL LOW (ref 36.0–46.0)
Hemoglobin: 8.5 g/dL — ABNORMAL LOW (ref 12.0–15.0)
MCH: 26.9 pg (ref 26.0–34.0)
MCHC: 29.8 g/dL — ABNORMAL LOW (ref 30.0–36.0)
MCV: 90.2 fL (ref 80.0–100.0)
Platelets: 353 10*3/uL (ref 150–400)
RBC: 3.16 MIL/uL — ABNORMAL LOW (ref 3.87–5.11)
RDW: 14.6 % (ref 11.5–15.5)
WBC: 11.8 10*3/uL — ABNORMAL HIGH (ref 4.0–10.5)
nRBC: 0 % (ref 0.0–0.2)

## 2023-08-15 LAB — BASIC METABOLIC PANEL
Anion gap: 13 (ref 5–15)
BUN: 14 mg/dL (ref 8–23)
CO2: 28 mmol/L (ref 22–32)
Calcium: 8.6 mg/dL — ABNORMAL LOW (ref 8.9–10.3)
Chloride: 98 mmol/L (ref 98–111)
Creatinine, Ser: 1.1 mg/dL — ABNORMAL HIGH (ref 0.44–1.00)
GFR, Estimated: 48 mL/min — ABNORMAL LOW (ref >60)
Glucose, Bld: 121 mg/dL — ABNORMAL HIGH (ref 70–99)
Potassium: 4 mmol/L (ref 3.5–5.1)
Sodium: 139 mmol/L (ref 135–145)

## 2023-08-15 LAB — MAGNESIUM: Magnesium: 1.7 mg/dL (ref 1.7–2.4)

## 2023-08-15 MED ORDER — CEFDINIR 300 MG PO CAPS
300.0000 mg | ORAL_CAPSULE | Freq: Two times a day (BID) | ORAL | Status: DC
Start: 2023-08-16 — End: 2023-08-18
  Administered 2023-08-16 – 2023-08-17 (×3): 300 mg via ORAL
  Filled 2023-08-15 (×4): qty 1

## 2023-08-15 MED ORDER — MAGNESIUM SULFATE 4 GM/100ML IV SOLN
4.0000 g | Freq: Once | INTRAVENOUS | Status: AC
  Administered 2023-08-15: 4 g via INTRAVENOUS
  Filled 2023-08-15: qty 100

## 2023-08-15 NOTE — Plan of Care (Signed)

## 2023-08-15 NOTE — Progress Notes (Addendum)
 Progress Note   Patient: Mallory Stephens FMW:[id number] DOB: 12/20/1932 DOA: 08/13/2023     1 DOS: the patient was seen and examined on 08/15/2023   Brief hospital course: Mrs. Lewing was admitted to the hospital with the working diagnosis of heart failure exacerbation.   89 year old female with past medical history of hypertension, congestive heart failure, atrial fibrillation, iron  deficiency anemia and prior history of breast cancer presented to [facility] emergency department with complaints of shortness of breath and bilateral lower extremity swelling.  Upon evaluation in the emergency department patient was found to be hypoxic and tachycardic.  Patient was found to have a markedly elevated BNP of 897.  Chest x-ray revealed patchy infiltrates in addition to a more dense left upper lobe infiltrate.  EDP felt the patient exhibited clinical features of both CHF and pneumonia.  40 mg of intravenous Lasix  as well as ceftriaxone  and azithromycin  were administered.  Patient was also given 50 mill equivalents of potassium for potassium of 2.9.  01/10 continue diuresis.  01/11 improved volume status, follow up chest radiograph with persistent left upper lobe infiltrate.   Assessment and Plan: * Acute on chronic diastolic CHF (congestive heart failure) (HCC) Echocardiogram with preserved LV systolic function with EF 55 to 60%, mild LVH, RV systolic function preserved, LA with moderate dilatation and RA with severe dilatation, mild MR, mild MS, mild AS, mild TR   Urine output is  2,175 ml   Systolic blood pressure 120's   Improved volume status, will hold on pm dose of furosemide , she had 60 mg this morning.  Continue losartan , metoprolol  and SGLT 2 inh.   Acute cardiogenic pulmonary edema, continue diuresis.  Oxygenation today is 95 to 97 on 2 L/min per [place].   CAP (community acquired pneumonia) Post partial diuresis chest radiograph with persistent left upper lobe infiltrate.  Positive  leukocytosis on admission.   Plan to continue antibiotic therapy with azithromycin  and change ceftriaxone  to cefdinir .  Follow up cell count and cultures.  Bronchodilator therapy.   Chronic anemia Hemoglobin at baseline Patient has received prior iron  infusions and transfusions Continue oral iron    Hypokalemia Renal function with serum cr at 1,1 with K at 4,0 and serum bicarbonate at 28  Na 139 and Mg 1,7   Plan to add 4 g mag today to prevent hypomagnesemia.  Follow up renal function in am.   Permanent atrial fibrillation (HCC) Continue rate control with metoprolol  and anticoagulation with apixaban .  Telemetry personally reviewed noted atrial fibrillation with rate 80 bpm.   Essential hypertension On losartan  and metoprolol  Aggressive diuresis.   Malignant neoplasm of upper-inner quadrant of left breast in female, estrogen receptor positive (HCC) S/p left lumpectomy, opted against radiation Continue anastrozole        Subjective: Patient is feeling much better, but continue to use supplemental 02 per [place], her dyspnea and edema have improved. She is seating in the chair this morning.   Physical Exam: Vitals:   08/15/23 0342 08/15/23 0500 08/15/23 0803 08/15/23 1129  BP: (!) 111/59  (!) 90/50 116/85  Pulse: 73     Resp: 14     Temp: 98.1 F (36.7 C)  97.9 F (36.6 C) 98 F (36.7 C)  TempSrc: Oral  Oral Oral  SpO2: 95%     Weight:  57.2 kg    Height:       Neurology awake and alert ENT with no pallor or icterus Cardiovascular with S1 and S2 present, irregularly irregular with no  gallops, rubs or murmurs No JVD No lower extremity edema Respiratory with no rales or wheezing, no rhonchi Abdomen with no distention   Data Reviewed:    Family Communication: no family at the bedside  I spoke with patient's daughter over the phone, we talked in detail about patient's condition, plan of care and prognosis and all questions were addressed.   Disposition: Status  is: Inpatient Remains inpatient appropriate because: IV furosemide    Planned Discharge Destination: Home      Author: Elidia Toribio Furnace, MD 08/15/2023 1:05 PM  For on call review [email or web address].

## 2023-08-16 DIAGNOSIS — I5033 Acute on chronic diastolic (congestive) heart failure: Secondary | ICD-10-CM | POA: Diagnosis not present

## 2023-08-16 DIAGNOSIS — D649 Anemia, unspecified: Secondary | ICD-10-CM | POA: Diagnosis not present

## 2023-08-16 DIAGNOSIS — I4821 Permanent atrial fibrillation: Secondary | ICD-10-CM | POA: Diagnosis not present

## 2023-08-16 DIAGNOSIS — J189 Pneumonia, unspecified organism: Secondary | ICD-10-CM | POA: Diagnosis not present

## 2023-08-16 LAB — BASIC METABOLIC PANEL
Anion gap: 10 (ref 5–15)
BUN: 17 mg/dL (ref 8–23)
CO2: 28 mmol/L (ref 22–32)
Calcium: 9.5 mg/dL (ref 8.9–10.3)
Chloride: 101 mmol/L (ref 98–111)
Creatinine, Ser: 1.17 mg/dL — ABNORMAL HIGH (ref 0.44–1.00)
GFR, Estimated: 45 mL/min — ABNORMAL LOW (ref >60)
Glucose, Bld: 111 mg/dL — ABNORMAL HIGH (ref 70–99)
Potassium: 4.9 mmol/L (ref 3.5–5.1)
Sodium: 139 mmol/L (ref 135–145)

## 2023-08-16 LAB — MAGNESIUM: Magnesium: 2.8 mg/dL — ABNORMAL HIGH (ref 1.7–2.4)

## 2023-08-16 NOTE — Progress Notes (Signed)
 Progress Note   Patient: Mallory Stephens FMW:[id number] DOB: 1933-01-14 DOA: 08/13/2023     2 DOS: the patient was seen and examined on 08/16/2023   Brief hospital course: Mrs. Crotty was admitted to the hospital with the working diagnosis of heart failure exacerbation.   89 year old female with past medical history of hypertension, congestive heart failure, atrial fibrillation, iron  deficiency anemia and prior history of breast cancer presented to [facility] emergency department with complaints of shortness of breath and bilateral lower extremity swelling.  Upon evaluation in the emergency department patient was found to be hypoxic and tachycardic.  Patient was found to have a markedly elevated BNP of 897.  Chest x-ray revealed patchy infiltrates in addition to a more dense left upper lobe infiltrate.  EDP felt the patient exhibited clinical features of both CHF and pneumonia.  40 mg of intravenous Lasix  as well as ceftriaxone  and azithromycin  were administered.  Patient was also given 50 mill equivalents of potassium for potassium of 2.9.  01/10 continue diuresis.  01/11 improved volume status, follow up chest radiograph with persistent left upper lobe infiltrate.  01/12 clinically improving, pending PT and OT evaluation, she may need SNF.   Assessment and Plan: * Acute on chronic diastolic CHF (congestive heart failure) (HCC) Echocardiogram with preserved LV systolic function with EF 55 to 60%, mild LVH, RV systolic function preserved, LA with moderate dilatation and RA with severe dilatation, mild MR, mild MS, mild AS, mild TR   Urine output is  950 ml   Systolic blood pressure 130's   Improved volume status. Plan to resume oral loop diuretic in am.  Continue losartan  and metoprolol  Discontinue SGLT 2 inh due to history of urinary tract infections.   Acute cardiogenic pulmonary edema, continue diuresis.  Oxygenation today is 99 on 2 L/min per [place].  Wean off supplemental 02 to  room air.   CAP (community acquired pneumonia) Post partial diuresis chest radiograph with persistent left upper lobe infiltrate.  Positive leukocytosis on admission.   Plan to continue antibiotic therapy with azithromycin  and cefdinir .  Follow up cell count and cultures.  Bronchodilator therapy.   Chronic anemia Hemoglobin at baseline Patient has received prior iron  infusions and transfusions Continue oral iron    Hypokalemia Improved volume status, renal function with serum cr at 1,1 with K at 4,9 and serum bicarbonate at 28  Na 139 and Mg 2.8   Continue to hold on loop diuretic Follow up renal function in am.   Permanent atrial fibrillation (HCC) Continue rate control with metoprolol  and anticoagulation with apixaban .  Telemetry personally reviewed noted atrial fibrillation with rate 80 bpm.   Essential hypertension On losartan  and metoprolol    Malignant neoplasm of upper-inner quadrant of left breast in female, estrogen receptor positive (HCC) S/p left lumpectomy, opted against radiation Continue anastrozole          Subjective: Patient is feeling better, dyspnea and edema have improved. She was noted mild confused per her daughter at the bedside.   Physical Exam: Vitals:   08/15/23 2307 08/16/23 0323 08/16/23 0737 08/16/23 1124  BP: 97/79 (!) 134/59 (!) 154/58 113/84  Pulse: 76 69 74 87  Resp: 17 19 20  (!) 26  Temp: 97.7 F (36.5 C) 98 F (36.7 C) 98 F (36.7 C) 97.8 F (36.6 C)  TempSrc: Oral Oral Oral Oral  SpO2: 99% 99% 99% 97%  Weight:  56 kg    Height:       Neurology awake and alert, non focal,  appropriate thinking and attention. Mild confusion to place.  ENT with mild pallor Cardiovascular with S1 and S2 present, irregularly irregular with no gallops, rubs or murmurs Respiratory with no rales or wheezing, no rhonchi Abdomen with no distention  Data Reviewed:    Family Communication: I spoke with patient's daughter at the bedside, we talked  in detail about patient's condition, plan of care and prognosis and all questions were addressed.    Disposition: Status is: Inpatient Remains inpatient appropriate because: pending PT and OT   Planned Discharge Destination: Skilled nursing facility      Author: Elidia Toribio Furnace, MD 08/16/2023 11:45 AM  For on call review [email or web address].

## 2023-08-16 NOTE — Progress Notes (Signed)
 Physical Therapy Evaluation Patient Details Name: Mallory Stephens MRN: [id number] DOB: Sep 24, 1932 Today's Date: 08/16/2023  History of Present Illness  Pt is 89 yo presenting to [facility] ED on 1/9  with reports of shortness of breath and bil lower extremity swelling. Found to have exacerbation of CHF and pneumonia. PMH includes:atrial fibrillation, heart failure, iron  deficiency anemia, HTN, HLD, breast cancer  Clinical Impression  Pt is presenting below baseline level of functioning. Currently pt is Min A for bed mobility, sit to stand and gait. Pt is at a high risk for falls. Pt is a questionable historian and though she states she lives with her sister she potentially has little to no assistance at home. Due to pt current functional status, home set up and available assistance at home recommending skilled physical therapy services < 3 hours/day in order to address strength, balance and functional mobility to decrease risk for falls, injury, immobility, skin break down and re-hospitalization.          If plan is discharge home, recommend the following: A little help with walking and/or transfers;Assistance with cooking/housework;Assist for transportation;Supervision due to cognitive status;Help with stairs or ramp for entrance     Equipment Recommendations None recommended by PT     Functional Status Assessment Patient has had a recent decline in their functional status and demonstrates the ability to make significant improvements in function in a reasonable and predictable amount of time.     Precautions / Restrictions Precautions Precautions: Fall Restrictions Weight Bearing Restrictions Per Provider Order: No      Mobility  Bed Mobility Overal bed mobility: Needs Assistance Bed Mobility: Supine to Sit     Supine to sit: Min assist     General bed mobility comments: Min A for trunk to mid line and with LE. Initially posterior lean. Pt adamant to sit on edge of bed at end of  session. Nursing is aware and alarm is set.    Transfers Overall transfer level: Needs assistance Equipment used: 1 person hand held assist Transfers: Sit to/from Stand Sit to Stand: Min assist           General transfer comment: Min A for safety and stability.    Ambulation/Gait Ambulation/Gait assistance: Min assist Gait Distance (Feet): 120 Feet Assistive device: 1 person hand held assist Gait Pattern/deviations: Step-through pattern, Drifts right/left, Decreased step length - right, Decreased step length - left, Shuffle, Narrow base of support Gait velocity: decreased Gait velocity interpretation: <1.8 ft/sec, indicate of risk for recurrent falls   General Gait Details: Requires Min A for balance, Narrow BOS with posterior lean. Unsteady stepping with uneven step length bil.     Balance Overall balance assessment: Needs assistance Sitting-balance support: No upper extremity supported, Feet supported, Feet unsupported Sitting balance-Leahy Scale: Fair Sitting balance - Comments: Initially slight posterior lean, improved. Postural control: Posterior lean Standing balance support: Single extremity supported, During functional activity Standing balance-Leahy Scale: Poor Standing balance comment: reliant on external support for balance.       Pertinent Vitals/Pain Pain Assessment Pain Assessment: No/denies pain    Home Living Family/patient expects to be discharged to:: Private residence Living Arrangements: Other relatives (pt states she lives with her sister) Available Help at Discharge: Available 24 hours/day;Available PRN/intermittently Type of Home: House Home Access: Stairs to enter Entrance Stairs-Rails: Right Entrance Stairs-Number of Steps: 3 Alternate Level Stairs-Number of Steps: stair lift to upstairs Home Layout: Two level;Full bath on main level;Able to live on main level with bedroom/bathroom  Home Equipment: Agricultural Consultant (2 wheels);Cane - single  point;Grab bars - tub/shower;Shower seat Additional Comments: information from pt and previous chart. Pt is a questionable historian.    Prior Function Prior Level of Function : Needs assist             Mobility Comments: pt reports she does not use an AD ADLs Comments: independent     Extremity/Trunk Assessment   Upper Extremity Assessment Upper Extremity Assessment: Generalized weakness    Lower Extremity Assessment Lower Extremity Assessment: Generalized weakness    Cervical / Trunk Assessment Cervical / Trunk Assessment: Normal  Communication   Communication Communication: No apparent difficulties Cueing Techniques: Verbal cues;Gestural cues;Tactile cues  Cognition Arousal: Alert Behavior During Therapy: WFL for tasks assessed/performed, Agitated Overall Cognitive Status: Impaired/Different from baseline Area of Impairment: Orientation, Safety/judgement         Orientation Level: Disoriented to, Place, Time, Situation       Safety/Judgement: Decreased awareness of safety, Decreased awareness of deficits              General Comments General comments (skin integrity, edema, etc.): pt Hr remained in the 80's and 90's throughout session and O2 sats in the lower 90's on 2L O2 via [place].        Assessment/Plan    PT Assessment Patient needs continued PT services  PT Problem List Decreased strength;Decreased activity tolerance;Decreased mobility;Decreased balance       PT Treatment Interventions DME instruction;Stair training;Therapeutic activities;Balance training;Gait training;Functional mobility training;Therapeutic exercise;Patient/family education    PT Goals (Current goals can be found in the Care Plan section)  Acute Rehab PT Goals Patient Stated Goal: to go home with sister PT Goal Formulation: With patient Time For Goal Achievement: 08/30/23 Potential to Achieve Goals: Fair    Frequency Min 1X/week        AM-PAC PT 6 Clicks Mobility   Outcome Measure Help needed turning from your back to your side while in a flat bed without using bedrails?: A Little Help needed moving from lying on your back to sitting on the side of a flat bed without using bedrails?: A Little Help needed moving to and from a bed to a chair (including a wheelchair)?: A Little Help needed standing up from a chair using your arms (e.g., wheelchair or bedside chair)?: A Little Help needed to walk in hospital room?: A Little Help needed climbing 3-5 steps with a railing? : A Lot 6 Click Score: 17    End of Session Equipment Utilized During Treatment: Gait belt Activity Tolerance: Patient tolerated treatment well Patient left: in bed;with call bell/phone within reach;with bed alarm set Nurse Communication: Mobility status;Other (comment) (pt sitting EOB) PT Visit Diagnosis: Unsteadiness on feet (R26.81);Other abnormalities of gait and mobility (R26.89)    Time: 8556-8493 PT Time Calculation (min) (ACUTE ONLY): 23 min   Charges:   PT Evaluation $PT Eval Low Complexity: 1 Low PT Treatments $Therapeutic Activity: 8-22 mins PT General Charges $$ ACUTE PT VISIT: 1 Visit         Mallory Stephens, DPT, CLT  Acute Rehabilitation Services Office: [phone] (Secure chat preferred)   Mallory Stephens 08/16/2023, 4:36 PM

## 2023-08-16 NOTE — Plan of Care (Signed)

## 2023-08-17 ENCOUNTER — Other Ambulatory Visit (HOSPITAL_COMMUNITY): Payer: Self-pay

## 2023-08-17 DIAGNOSIS — J189 Pneumonia, unspecified organism: Secondary | ICD-10-CM | POA: Diagnosis not present

## 2023-08-17 DIAGNOSIS — E876 Hypokalemia: Secondary | ICD-10-CM | POA: Diagnosis not present

## 2023-08-17 DIAGNOSIS — D649 Anemia, unspecified: Secondary | ICD-10-CM | POA: Diagnosis not present

## 2023-08-17 DIAGNOSIS — I5033 Acute on chronic diastolic (congestive) heart failure: Secondary | ICD-10-CM | POA: Diagnosis not present

## 2023-08-17 LAB — BASIC METABOLIC PANEL
Anion gap: 9 (ref 5–15)
BUN: 22 mg/dL (ref 8–23)
CO2: 27 mmol/L (ref 22–32)
Calcium: 9.3 mg/dL (ref 8.9–10.3)
Chloride: 100 mmol/L (ref 98–111)
Creatinine, Ser: 1.22 mg/dL — ABNORMAL HIGH (ref 0.44–1.00)
GFR, Estimated: 43 mL/min — ABNORMAL LOW (ref >60)
Glucose, Bld: 89 mg/dL (ref 70–99)
Potassium: 4.4 mmol/L (ref 3.5–5.1)
Sodium: 136 mmol/L (ref 135–145)

## 2023-08-17 MED ORDER — FUROSEMIDE 40 MG PO TABS
40.0000 mg | ORAL_TABLET | Freq: Every day | ORAL | 0 refills | Status: DC
  Filled 2023-08-17: qty 30, 30d supply, fill #0

## 2023-08-17 MED ORDER — FUROSEMIDE 40 MG PO TABS
40.0000 mg | ORAL_TABLET | Freq: Every day | ORAL | Status: DC
  Administered 2023-08-17: 40 mg via ORAL
  Filled 2023-08-17: qty 1

## 2023-08-17 NOTE — TOC Transition Note (Addendum)
 Transition of Care [facility]) - Discharge Note   Patient Details  Name: Mallory Stephens MRN: [id number] Date of Birth: [birth year]  [facility]) CM/SW Contact:  Waddell Barnie Rama, RN Phone Number: 08/17/2023, 2:30 PM   Clinical Narrative:    For dc today, she is set up with [facility].  Her niece will transport her home.  TOC will fill meds.   Final next level of care: Home w Home Health Services Barriers to Discharge: No Barriers Identified   Patient Goals and CMS Choice Patient states their goals for this hospitalization and ongoing recovery are:: return home CMS Medicare.gov Compare Post Acute Care list provided to:: Patient Choice offered to / list presented to : Patient      Discharge Placement                       Discharge Plan and Services Additional resources added to the After Visit Summary for   In-house Referral: NA Discharge Planning Services: CM Consult Post Acute Care Choice: Home Health          DME Arranged: N/A DME Agency: NA       HH Arranged: RN, PT, OT HH Agency: [facility] Date [facility] Agency Contacted: 08/17/23 Time HH Agency Contacted: 1424 Representative spoke with at [facility] Agency: [place]  Social Drivers of Health (SDOH) Interventions SDOH Screenings   Food Insecurity: No Food Insecurity (08/13/2023)  Housing: Low Risk  (08/13/2023)  Transportation Needs: No Transportation Needs (08/13/2023)  Utilities: Not At Risk (08/13/2023)  Social Connections: Socially Isolated (08/13/2023)  Tobacco Use: Medium Risk (08/13/2023)     Readmission Risk Interventions    10/28/2022    4:35 PM  Readmission Risk Prevention Plan  Transportation Screening Complete  PCP or Specialist Appt within 5-7 Days Complete  Home Care Screening Complete  Medication Review (RN CM) Referral to Pharmacy

## 2023-08-17 NOTE — Progress Notes (Signed)
 Physical Therapy Treatment Patient Details Name: Mallory Stephens MRN: [id number] DOB: 20-Nov-1932 Today's Date: 08/17/2023   History of Present Illness Pt is 89 yo presenting to [facility] ED on 1/9  with reports of shortness of breath and bil lower extremity swelling. Found to have exacerbation of CHF and pneumonia. PMH includes:atrial fibrillation, heart failure, iron  deficiency anemia, HTN, HLD, breast cancer    PT Comments  Pt used rolling walker today which improves stability during gait. Pt remains confused and OT confirmed with niece that sister can provide needed supervision at home. Mobility improving and recommend HHPT at dc. Safety due to cognitive deficits is primary issue with mobility.     If plan is discharge home, recommend the following: A little help with walking and/or transfers;A little help with bathing/dressing/bathroom;Assistance with cooking/housework;Direct supervision/assist for medications management;Direct supervision/assist for financial management;Assist for transportation;Help with stairs or ramp for entrance;Supervision due to cognitive status   Can travel by private vehicle        Equipment Recommendations  None recommended by PT    Recommendations for Other Services       Precautions / Restrictions Precautions Precautions: Fall Restrictions Weight Bearing Restrictions Per Provider Order: No     Mobility  Bed Mobility Overal bed mobility: Needs Assistance Bed Mobility: Supine to Sit     Supine to sit: Supervision     General bed mobility comments: supervision for safety/lines    Transfers Overall transfer level: Needs assistance Equipment used: 1 person hand held assist, Rolling walker (2 wheels) Transfers: Sit to/from Stand Sit to Stand: Contact guard assist           General transfer comment: cues for safety and incr time to rise    Ambulation/Gait Ambulation/Gait assistance: Supervision, Contact guard assist Gait Distance (Feet): 130  Feet Assistive device: Rolling walker (2 wheels) Gait Pattern/deviations: Step-through pattern, Decreased step length - right, Decreased step length - left Gait velocity: decr Gait velocity interpretation: <1.8 ft/sec, indicate of risk for recurrent falls   General Gait Details: Assist for safety and lines. Able to step backwards in order to allow room for pt to open door to leave room   Stairs             Wheelchair Mobility     Tilt Bed    Modified Rankin (Stroke Patients Only)       Balance Overall balance assessment: Needs assistance Sitting-balance support: No upper extremity supported, Feet supported, Feet unsupported Sitting balance-Leahy Scale: Fair     Standing balance support: Single extremity supported, During functional activity Standing balance-Leahy Scale: Poor Standing balance comment: benefits from RW                            Cognition Arousal: Alert Behavior During Therapy: WFL for tasks assessed/performed, Agitated Overall Cognitive Status: Impaired/Different from baseline Area of Impairment: Orientation, Safety/judgement, Attention, Memory, Following commands, Awareness, Problem solving                 Orientation Level: Disoriented to, Place, Time, Situation Current Attention Level: Focused, Sustained Memory: Decreased short-term memory (poor immediate almost recall, stating where do I wash my hands and then unable to recall she was going to wash her hands once out of restroom.) Following Commands: Follows one step commands with increased time, Follows one step commands consistently Safety/Judgement: Decreased awareness of safety, Decreased awareness of deficits Awareness: Intellectual, Emergent Problem Solving: Slow processing, Requires verbal cues General  Comments: poor immediate and delayed memory recall. Needing cues for safety throughout, not oriented. Given report that family does several IADL, suspect pt close to  baseline        Exercises      General Comments General comments (skin integrity, edema, etc.): HR 122 with mobility      Pertinent Vitals/Pain      Home Living Family/patient expects to be discharged to:: Private residence Living Arrangements: Other (Comment) (sister) Available Help at Discharge: Available 24 hours/day;Available PRN/intermittently Type of Home: House Home Access: Stairs to enter Entrance Stairs-Rails: Right Entrance Stairs-Number of Steps: 3 Alternate Level Stairs-Number of Steps: stair lift to upstairs Home Layout: Two level;Full bath on main level;Able to live on main level with bedroom/bathroom Home Equipment: Rolling Walker (2 wheels);Cane - single point;Grab bars - tub/shower;Shower seat Additional Comments: information from pt and previous chart. Pt is a questionable historian.    Prior Function            PT Goals (current goals can now be found in the care plan section) Progress towards PT goals: Progressing toward goals    Frequency    Min 1X/week      PT Plan      Co-evaluation              AM-PAC PT 6 Clicks Mobility   Outcome Measure  Help needed turning from your back to your side while in a flat bed without using bedrails?: None Help needed moving from lying on your back to sitting on the side of a flat bed without using bedrails?: A Little Help needed moving to and from a bed to a chair (including a wheelchair)?: A Little Help needed standing up from a chair using your arms (e.g., wheelchair or bedside chair)?: A Little Help needed to walk in hospital room?: A Little Help needed climbing 3-5 steps with a railing? : A Little 6 Click Score: 19    End of Session   Activity Tolerance: Patient tolerated treatment well Patient left: in bed;Other (comment) (sitting EOB with OT present) Nurse Communication: Mobility status PT Visit Diagnosis: Unsteadiness on feet (R26.81);Other abnormalities of gait and mobility (R26.89)      Time: 1300-1320 PT Time Calculation (min) (ACUTE ONLY): 20 min  Charges:    $Gait Training: 8-22 mins PT General Charges $$ ACUTE PT VISIT: 1 Visit                     [facility] PT Acute Rehabilitation Services Office [phone]    Rodgers ORN [facility] 08/17/2023, 2:36 PM

## 2023-08-17 NOTE — TOC Initial Note (Signed)
 Transition of Care [facility]) - Initial/Assessment Note    Patient Details  Name: Mallory Stephens MRN: [id number] Date of Birth: [birth year]  Transition of Care [facility]) CM/SW Contact:    Waddell Barnie Rama, RN Phone Number: 08/17/2023, 2:27 PM  Clinical Narrative:                 From home with sister, has PCP and insurance on file, states has no HH services in place at this time, has a cane and a walker  at home.  States neice will transport her  home at dc and family is support system, states gets medications from mail order.    Pta  ambulatory with walker. NCM offered choice for [facility] services , she does not have a prerference,  NCM made referral to [facility] with [facility], she is able to take referral.  Soc will begin 24 to 48 hrs post dc.   Expected Discharge Plan: Home w Home Health Services Barriers to Discharge: No Barriers Identified   Patient Goals and CMS Choice Patient states their goals for this hospitalization and ongoing recovery are:: return home CMS Medicare.gov Compare Post Acute Care list provided to:: Patient Choice offered to / list presented to : Patient      Expected Discharge Plan and Services In-house Referral: NA Discharge Planning Services: CM Consult Post Acute Care Choice: Home Health Living arrangements for the past 2 months: Single Family Home Expected Discharge Date: 08/17/23               DME Arranged: N/A DME Agency: NA       HH Arranged: RN, PT, OT HH Agency: [facility] Date HH Agency Contacted: 08/17/23 Time HH Agency Contacted: 1424 Representative spoke with at [facility] Agency: [place]  Prior Living Arrangements/Services Living arrangements for the past 2 months: Single Family Home Lives with:: Siblings (sister) Patient language and need for interpreter reviewed:: Yes Do you feel safe going back to the place where you live?: Yes      Need for Family Participation in Patient Care: Yes (Comment) Care giver support system in place?: Yes  (comment) Current home services: DME (walker, cane) Criminal Activity/Legal Involvement Pertinent to Current Situation/Hospitalization: No - Comment as needed  Activities of Daily Living   ADL Screening (condition at time of admission) Independently performs ADLs?: Yes (appropriate for developmental age) Is the patient deaf or have difficulty hearing?: Yes Does the patient have difficulty seeing, even when wearing glasses/contacts?: No Does the patient have difficulty concentrating, remembering, or making decisions?: No  Permission Sought/Granted Permission sought to share information with : Case Manager Permission granted to share information with : Yes, Verbal Permission Granted     Permission granted to share info w AGENCY: HH        Emotional Assessment Appearance:: Appears stated age Attitude/Demeanor/Rapport: Engaged Affect (typically observed): Appropriate Orientation: : Oriented to Self Alcohol  / Substance Use: Not Applicable Psych Involvement: No (comment)  Admission diagnosis:  Hypokalemia [E87.6] Acute CHF (congestive heart failure) (HCC) [I50.9] Pneumonia of left upper lobe due to infectious organism [J18.9] Acute on chronic congestive heart failure, unspecified heart failure type (HCC) [I50.9] Heart failure (HCC) [I50.9] Patient Active Problem List   Diagnosis Date Noted   Heart failure (HCC) 08/14/2023   Acute CHF (congestive heart failure) (HCC) 08/13/2023   Breast cancer (HCC) 10/25/2022   Permanent atrial fibrillation (HCC) 10/25/2022   Essential hypertension 10/25/2022   Hyperlipidemia 10/25/2022   Hypokalemia 10/25/2022   Chronic anemia 10/25/2022  Acute respiratory failure with hypoxia secondary to CAP vs/+ new CHF exacerbation 10/25/2022   Acute on chronic diastolic CHF (congestive heart failure) (HCC) 10/25/2022   CAP (community acquired pneumonia) 10/25/2022   Genetic testing 11/26/2020   History of basal cell carcinoma    Family history of  breast cancer    Family history of prostate cancer    Malignant neoplasm of upper-inner quadrant of left breast in female, 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/2020   Hypokalemia 04/29/2017   Leukocytosis 04/29/2017   Syncope 04/29/2017   UTI (urinary tract infection) 04/28/2017   Fall 04/28/2017   Nausea and vomiting 04/28/2017   Generalized weakness 04/28/2017   Constipation 04/28/2017   Diverticulitis    Edema of extremities 09/03/2016   Dyslipidemia    Permanent atrial fibrillation (HCC) 07/08/2013   Essential hypertension, benign 07/08/2013   Long term (current) use of anticoagulants 07/08/2013   Encounter for long-term (current) use of other medications 07/08/2013   PCP:  Verena Mems, MD Pharmacy:   [phone] [place], [place] - [address] AT [address] OF [facility] [address] [place] [address] Phone: [phone] Fax: [phone]  [facility] [place], [place] - [address] [facility] AT Portal to Registered [address] [place] [address] Phone: [phone] Fax: [phone]  [facility] [address] [place] [address] Phone: [phone] Fax: [phone]  [name] [address] [place] [address] Phone: [phone] Fax: [phone]     Social Drivers of Health (SDOH) Social History: SDOH Screenings   Food Insecurity: No Food Insecurity (08/13/2023)  Housing: Low Risk  (08/13/2023)  Transportation Needs: No Transportation Needs (08/13/2023)  Utilities: Not At Risk (08/13/2023)  Social Connections: Socially Isolated (08/13/2023)  Tobacco Use: Medium Risk (08/13/2023)   SDOH Interventions:     Readmission Risk Interventions    10/28/2022    4:35 PM  Readmission Risk Prevention Plan  Transportation Screening Complete  PCP or Specialist Appt within 5-7 Days Complete  Home  Care Screening Complete  Medication Review (RN CM) Referral to Pharmacy

## 2023-08-17 NOTE — Progress Notes (Signed)
 Patient & niece Vernona Rieger provided with verbal discharge instructions. Paper copy of discharge provided to patient. RN answered all questions. VSS at discharge. No IV present. Patient belongings sent with patient. Patient dc'd via wheelchair to private vehicle

## 2023-08-17 NOTE — Plan of Care (Signed)
  Problem: Clinical Measurements: Goal: Diagnostic test results will improve Outcome: Progressing Goal: Respiratory complications will improve Outcome: Progressing Goal: Cardiovascular complication will be avoided Outcome: Progressing   Problem: Elimination: Goal: Will not experience complications related to urinary retention Outcome: Progressing   Problem: Pain Management: Goal: General experience of comfort will improve Outcome: Progressing   Problem: Activity: Goal: Capacity to carry out activities will improve Outcome: Progressing

## 2023-08-17 NOTE — Care Management Important Message (Signed)
 Important Message  Patient Details  Name: Mallory Stephens MRN: [id number] Date of Birth: [birth year]   Important Message Given:  Yes - Medicare IM     Dorena Bodo 08/17/2023, 2:11 PM

## 2023-08-17 NOTE — Evaluation (Signed)
 Occupational Therapy Evaluation Patient Details Name: Mallory Stephens MRN: [id number] DOB: 12/18/1932 Today's Date: 08/17/2023   History of Present Illness Pt is 89 yo presenting to [facility] ED on 1/9  with reports of shortness of breath and bil lower extremity swelling. Found to have exacerbation of CHF and pneumonia. PMH includes:atrial fibrillation, heart failure, iron  deficiency anemia, HTN, HLD, breast cancer   Clinical Impression   PTA, pt lived with sister and was mod I for ADL; sister assists with IADL of cooking and driving. Niece provides set-up A for medication management and per niece, sister can remind pt about medications as needed. Upon eval, pt pleasantly confused. Needing up to CGA for functional mobility and ADL within her room as well as reminders to use RW. Pt with decreased memory affecting safety; niece present and aware speaking with OT after session. Sister to supervise at home and believe pt's cognition will improve in natural setting where OT recommends HHOT.       If plan is discharge home, recommend the following: A little help with walking and/or transfers;A little help with bathing/dressing/bathroom;Help with stairs or ramp for entrance;Assist for transportation;Direct supervision/assist for financial management;Direct supervision/assist for medications management;Assistance with cooking/housework;Supervision due to cognitive status    Functional Status Assessment  Patient has had a recent decline in their functional status and demonstrates the ability to make significant improvements in function in a reasonable and predictable amount of time.  Equipment Recommendations  None recommended by OT    Recommendations for Other Services       Precautions / Restrictions Precautions Precautions: Fall Restrictions Weight Bearing Restrictions Per Provider Order: No      Mobility Bed Mobility               General bed mobility comments: EOB with PT on arrival     Transfers Overall transfer level: Needs assistance Equipment used: 1 person hand held assist, Rolling walker (2 wheels) Transfers: Sit to/from Stand Sit to Stand: Contact guard assist           General transfer comment: cues for safety      Balance Overall balance assessment: Needs assistance Sitting-balance support: No upper extremity supported, Feet supported, Feet unsupported Sitting balance-Leahy Scale: Fair     Standing balance support: Single extremity supported, During functional activity Standing balance-Leahy Scale: Poor Standing balance comment: benefits from RW                           ADL either performed or assessed with clinical judgement   ADL Overall ADL's : Needs assistance/impaired Eating/Feeding: Set up;Sitting   Grooming: Contact guard assist;Standing;Wash/dry hands   Upper Body Bathing: Set up;Sitting   Lower Body Bathing: Contact guard assist;Sit to/from stand   Upper Body Dressing : Set up;Sitting   Lower Body Dressing: Contact guard assist;Sit to/from stand Lower Body Dressing Details (indicate cue type and reason): shoes Toilet Transfer: Contact guard assist;Ambulation;Rolling walker (2 wheels) Toilet Transfer Details (indicate cue type and reason): cues for safe hand placement Toileting- Clothing Manipulation and Hygiene: Contact guard assist;Sit to/from stand;Cueing for sequencing Toileting - Clothing Manipulation Details (indicate cue type and reason): cues to remember to pull up underpants     Functional mobility during ADLs: Contact guard assist;Rolling walker (2 wheels);Cueing for safety       Vision Baseline Vision/History: 1 Wears glasses Ability to See in Adequate Light: 1 Impaired Patient Visual Report: No change from baseline Additional Comments: able  to read OT badge; decreased visual acuity/contrast sensitivity noted during ADL     Perception         Praxis         Pertinent Vitals/Pain Pain  Assessment Pain Assessment: No/denies pain     Extremity/Trunk Assessment Upper Extremity Assessment Upper Extremity Assessment: Generalized weakness   Lower Extremity Assessment Lower Extremity Assessment: Defer to PT evaluation   Cervical / Trunk Assessment Cervical / Trunk Assessment: Normal   Communication Communication Communication: No apparent difficulties Cueing Techniques: Verbal cues;Gestural cues   Cognition Arousal: Alert Behavior During Therapy: WFL for tasks assessed/performed, Agitated Overall Cognitive Status: Impaired/Different from baseline Area of Impairment: Orientation, Safety/judgement, Attention, Memory, Following commands, Awareness, Problem solving                 Orientation Level: Disoriented to, Place, Time, Situation Current Attention Level: Focused, Sustained Memory: Decreased short-term memory (poor immediate almost recall, stating where do I wash my hands and then unable to recall she was going to wash her hands once out of restroom.) Following Commands: Follows one step commands with increased time, Follows one step commands consistently Safety/Judgement: Decreased awareness of safety, Decreased awareness of deficits Awareness: Intellectual, Emergent Problem Solving: Slow processing, Requires verbal cues General Comments: poor immediate and delayed memory recall. Needing cues for safety throughout, not oriented. Given report that family does several IADL, suspect pt close to baseline     General Comments  HR max 122 during mobility    Exercises     Shoulder Instructions      Home Living Family/patient expects to be discharged to:: Private residence Living Arrangements: Other (Comment) (sister) Available Help at Discharge: Available 24 hours/day;Available PRN/intermittently Type of Home: House Home Access: Stairs to enter [employer] of Steps: 3 Entrance Stairs-Rails: Right Home Layout: Two level;Full bath on main  level;Able to live on main level with bedroom/bathroom Alternate Level Stairs-Number of Steps: stair lift to upstairs Alternate Level Stairs-Rails: Right;Left Bathroom Shower/Tub: Producer, Television/film/video: Handicapped height     Home Equipment: [employer] (2 wheels);Cane - single point;Grab bars - tub/shower;Shower seat   Additional Comments: information from pt and previous chart. Pt is a questionable historian.      Prior Functioning/Environment Prior Level of Function : Needs assist             Mobility Comments: pt reports she does not use an AD ADLs Comments: independent in ADL per pt, niece provides set-up A for medication management, sister cooks, pt has not driven recently        OT Problem List: Decreased strength;Decreased activity tolerance;Impaired balance (sitting and/or standing);Decreased cognition;Decreased safety awareness      OT Treatment/Interventions: Self-care/ADL training;Therapeutic exercise;DME and/or AE instruction;Therapeutic activities;Patient/family education;Balance training;Cognitive remediation/compensation    OT Goals(Current goals can be found in the care plan section) Acute Rehab OT Goals Patient Stated Goal: go home OT Goal Formulation: With patient Time For Goal Achievement: 08/31/23 Potential to Achieve Goals: Good  OT Frequency: Min 1X/week    Co-evaluation              AM-PAC OT 6 Clicks Daily Activity     Outcome Measure Help from another person eating meals?: None Help from another person taking care of personal grooming?: A Little Help from another person toileting, which includes using toliet, bedpan, or urinal?: A Little Help from another person bathing (including washing, rinsing, drying)?: A Little Help from another person to put on and taking off  regular upper body clothing?: A Little Help from another person to put on and taking off regular lower body clothing?: A Little 6 Click Score: 19   End of  Session Equipment Utilized During Treatment: Rolling walker (2 wheels) Nurse Communication: Mobility status  Activity Tolerance: Patient tolerated treatment well Patient left: in bed;with call bell/phone within reach;with bed alarm set  OT Visit Diagnosis: Unsteadiness on feet (R26.81);Muscle weakness (generalized) (M62.81);Other symptoms and signs involving cognitive function                Time: 1315-1335 OT Time Calculation (min): 20 min Charges:  OT General Charges $OT Visit: 1 Visit OT Evaluation $OT Eval Low Complexity: 1 Low  Elma JONETTA Lebron FREDERICK, OTR/L [facility] Acute Rehabilitation Office: [phone]   Elma JONETTA Lebron 08/17/2023, 1:42 PM

## 2023-08-17 NOTE — Discharge Summary (Signed)
 Physician Discharge Summary   Patient: Mallory Stephens MRN: [id number] DOB: 01-30-1933  Admit date:     08/13/2023  Discharge date: 08/17/23  Discharge Physician: Elidia Sieving Marisella Puccio   PCP: Verena Mems, MD   Recommendations at discharge:    Furosemide  changed to 40 mg po daily.  If renal function stable as outpatient to consider adding spironolactone. Follow up renal function and electrolytes in 7 to 10 days.  Follow up with Dr Verena in 7 to 10 days.  Follow up with Cardiology as scheduled   Discharge Diagnoses: Principal Problem:   Acute on chronic diastolic CHF (congestive heart failure) (HCC) Active Problems:   CAP (community acquired pneumonia)   Hypokalemia   Chronic anemia   Permanent atrial fibrillation (HCC)   Essential hypertension   Malignant neoplasm of upper-inner quadrant of left breast in female, estrogen receptor positive (HCC)  Resolved Problems:   * No resolved hospital problems. [facility] Course: Mallory Stephens was admitted to the hospital with the working diagnosis of heart failure exacerbation in the setting of community acquired pneumonia.   89 year old female with past medical history of hypertension, congestive heart failure, atrial fibrillation, iron  deficiency anemia and prior history of breast cancer presented to [facility] emergency department with complaints of shortness of breath orthopnea and bilateral lower extremity swelling. On her initial physical examination her blood pressure was 150/96, HR 120, RR 20 and 02 saturation 80% on room air, that improved to mid 90's on 2 L/min of supplemental 02 per [place].  Positive tachypnea and increased work of breathing, bilateral rales, with no wheezing, heart with S1 and S2 present, irregularly irregular with no gallops, abdomen with no distention, positive lower extremity edema.   Na 142, K 2,9 Cl 105 bicarbonate 29, glucose 127, bun 17 cr 0,80 AST 11, ALT 8  BNP 897  High sensitive troponin 11   Procalcitonin <0.10  Wbc 11,4 hgb 8,7 plt 368  Sars covid 19 negative  Influenza negative   Chest radiograph with mild cardiomegaly, bilateral hilar vascular congestion, positive patchy infiltrate left upper lobe.   01/10 continue diuresis.  01/11 improved volume status, follow up chest radiograph with persistent left upper lobe infiltrate.  01/12 clinically improving, pending PT and OT evaluation, she may need SNF.  01/13 continue to improve, now on room air, plan for discharge home and follow up as outpatient.   Assessment and Plan: * Acute on chronic diastolic CHF (congestive heart failure) (HCC) Echocardiogram with preserved LV systolic function with EF 55 to 60%, mild LVH, RV systolic function preserved, LA with moderate dilatation and RA with severe dilatation, mild MR, mild MS, mild AS, mild TR   Patient was placed on furosemide  for diuresis, negative fluid balance was achieved, - 4.274 ml with significant improvement in her symptoms.  Patient lost about 5 kg during this hospitalization.   Plan to continue losartan  and metoprolol  No SGLT 2 inh due to history of urinary tract infections.  Will change furosemide  to daily dosing, 40 mg.   Acute cardiogenic pulmonary edema, with acute hypoxemic respiratory failure.  Oxygenation has improved, at the time of her discharge her 02 saturation 99% on 2 L/min per [place].   CAP (community acquired pneumonia) Post partial diuresis chest radiograph with persistent left upper lobe infiltrate.  Positive leukocytosis on admission.   Patient completed antibiotic therapy in the  hospital with macrolide and cephalosporin.   Chronic anemia Hemoglobin at baseline Patient has received prior iron  infusions and transfusions Continue  oral iron    Hypokalemia Renal function at the time of discharge has a serum cr of 1.2 with K at 4,4 and serum bicarbonate at 27  Na 136   Plan to continue oral diuretic therapy.   Permanent atrial fibrillation  (HCC) Continue rate control with metoprolol  and anticoagulation with apixaban .   Essential hypertension On losartan  and metoprolol    Malignant neoplasm of upper-inner quadrant of left breast in female, estrogen receptor positive (HCC) S/p left lumpectomy, opted against radiation Continue anastrozole           Consultants: none  Procedures performed: none   Disposition: Home Diet recommendation:  Cardiac diet DISCHARGE MEDICATION: Allergies as of 08/17/2023       Reactions   Sulfa Antibiotics    Sulfa Drugs Cross Reactors Rash        Medication List     STOP taking these medications    amLODipine  5 MG tablet Commonly known as: NORVASC        TAKE these medications    acetaminophen  325 MG tablet Commonly known as: TYLENOL  Take 325 mg by mouth every 6 (six) hours as needed for moderate pain.   anastrozole  1 MG tablet Commonly known as: ARIMIDEX  Take 1 tablet (1 mg total) by mouth daily.   cholecalciferol  25 MCG (1000 UNIT) tablet Commonly known as: VITAMIN D3 Take 1 tablet (1,000 Units total) by mouth daily.   Eliquis  5 MG Tabs tablet Generic drug: apixaban  TAKE 1 TABLET TWICE A DAY   famotidine  40 MG tablet Commonly known as: PEPCID  Take 40 mg by mouth at bedtime.   furosemide  40 MG tablet Commonly known as: LASIX  Take 1 tablet (40 mg total) by mouth daily. What changed:  medication strength how much to take when to take this   ibandronate  150 MG tablet Commonly known as: BONIVA  TAKE 1 TABLET BY MOUTH EVERY 30 DAYS ON THE 10TH DAY OF EACH MONTH IN THE MORNING WITH A FULL GLASS OF WATER,ON AN EMPTY STOMACH AND DO NOT TAKE ANYTHING ELSE BY MOUTH OR LIE DOWN FOR THE NEXT 30 MIN   losartan  25 MG tablet Commonly known as: Cozaar  Take 1 tablet (25 mg total) by mouth daily.   metoprolol  tartrate 25 MG tablet Commonly known as: LOPRESSOR  TAKE 1 TABLET TWICE A DAY   PARoxetine  10 MG tablet Commonly known as: PAXIL  Take 10 mg by mouth every  morning.   polyvinyl alcohol  1.4 % ophthalmic solution Commonly known as: LIQUIFILM TEARS Place 1-2 drops into both eyes every 6 (six) hours as needed for dry eyes.   pravastatin  20 MG tablet Commonly known as: PRAVACHOL  Take 20 mg by mouth at bedtime.   solifenacin 5 MG tablet Commonly known as: VESICARE Take 5 mg by mouth daily.        Discharge Exam: Filed Weights   08/15/23 0500 08/16/23 0323 08/17/23 0607  Weight: 57.2 kg 56 kg 55.3 kg   BP (!) 135/99   Pulse 81   Temp 98.3 F (36.8 C) (Oral)   Resp 16   Ht 5' 7 (1.702 m)   Wt 55.3 kg   SpO2 97%   BMI 19.09 kg/m   Patient is feeling better, no dyspnea or edema, no chest pain   Neurology awake and alert ENT with mild pallor Cardiovascular with S1 and S2 present, irregularly irregular, positive systolic murmur at the apex No JVD No lower extremity edema Respiratory with mild rales at bases with no wheezing or rhonchi Abdomen with no distention  Condition at discharge: stable  The results of significant diagnostics from this hospitalization (including imaging, microbiology, ancillary and laboratory) are listed below for reference.   Imaging Studies: DG Chest 1 View Result Date: 08/14/2023 CLINICAL DATA:  Shortness of breath. EXAM: CHEST  1 VIEW COMPARISON:  August 13, 2023. FINDINGS: Stable cardiomegaly. Continued left upper lobe opacity is noted concerning for pneumonia. Right lung is unremarkable. Minimal left basilar atelectasis or scarring is noted. Bony thorax is unremarkable. IMPRESSION: Continued left upper lobe opacity concerning for pneumonia. Minimal left basilar atelectasis or scarring is noted. Electronically Signed   By: Lynwood Landy Raddle M.D.   On: 08/14/2023 15:57   ECHOCARDIOGRAM COMPLETE Result Date: 08/14/2023    ECHOCARDIOGRAM REPORT   Patient Name:   LINNIE DELGRANDE Date of Exam: 08/14/2023 Medical Rec #:  [id number]        Height:       67.0 in Accession #:    [id number]       Weight:        132.1 lb Date of Birth:  Mar 07, 1933        BSA:          1.695 m Patient Age:    89 years         BP:           102/63 mmHg Patient Gender: F                HR:           103 bpm. Exam Location:  Inpatient Procedure: 2D Echo, Cardiac Doppler and Color Doppler Indications:    CHF  History:        Patient has prior history of Echocardiogram examinations, most                 recent 10/27/2022. Arrythmias:Atrial Fibrillation; Risk                 Factors:Hypertension.  Sonographer:    Jayson Gaskins Referring Phys: [id number] HAZEL V DUNCAN IMPRESSIONS  1. Left ventricular ejection fraction, by estimation, is 55 to 60%. The left ventricle has normal function. The left ventricle has no regional wall motion abnormalities. There is mild concentric left ventricular hypertrophy. Left ventricular diastolic parameters are indeterminate.  2. Peak RV-RA gradient 43 mmHg. Right ventricular systolic function is normal. The right ventricular size is moderately enlarged.  3. Left atrial size was moderately dilated.  4. Right atrial size was severely dilated.  5. The mitral valve is degenerative. Mild mitral valve regurgitation. Mild mitral stenosis. The mean mitral valve gradient is 7.8 mmHg but MVA by VTI is 2.06 cm^2. Moderate mitral annular calcification.  6. The aortic valve is tricuspid. There is moderate calcification of the aortic valve. Aortic valve regurgitation is trivial. Mild aortic valve stenosis. Aortic valve area, by VTI measures 1.55 cm. Aortic valve mean gradient measures 11.0 mmHg.  7. IVC not visualized.  8. The patient was in atrial fibrillation. FINDINGS  Left Ventricle: Left ventricular ejection fraction, by estimation, is 55 to 60%. The left ventricle has normal function. The left ventricle has no regional wall motion abnormalities. The left ventricular internal cavity size was normal in size. There is  mild concentric left ventricular hypertrophy. Left ventricular diastolic parameters are indeterminate. Right  Ventricle: Peak RV-RA gradient 43 mmHg. The right ventricular size is moderately enlarged. No increase in right ventricular wall thickness. Right ventricular systolic function is normal. Left Atrium: Left atrial size was moderately dilated. Right  Atrium: Right atrial size was severely dilated. Pericardium: There is no evidence of pericardial effusion. Mitral Valve: The mitral valve is degenerative in appearance. There is moderate calcification of the mitral valve leaflet(s). Moderate mitral annular calcification. Mild mitral valve regurgitation. Mild mitral valve stenosis. MV peak gradient, 14.1 mmHg.  The mean mitral valve gradient is 7.8 mmHg. Tricuspid Valve: The tricuspid valve is normal in structure. Tricuspid valve regurgitation is mild. Aortic Valve: The aortic valve is tricuspid. There is moderate calcification of the aortic valve. Aortic valve regurgitation is trivial. Mild aortic stenosis is present. Aortic valve mean gradient measures 11.0 mmHg. Aortic valve peak gradient measures 16.4 mmHg. Aortic valve area, by VTI measures 1.55 cm. Pulmonic Valve: The pulmonic valve was normal in structure. Pulmonic valve regurgitation is not visualized. Aorta: The aortic root is normal in size and structure. Venous: IVC not visualized. IAS/Shunts: No atrial level shunt detected by color flow Doppler.  LEFT VENTRICLE PLAX 2D LVIDd:         3.60 cm LVIDs:         2.70 cm LV PW:         1.30 cm LV IVS:        1.20 cm LVOT diam:     1.70 cm LV SV:         57 LV SV Index:   34 LVOT Area:     2.27 cm  RIGHT VENTRICLE RV Basal diam:  4.40 cm RV Mid diam:    3.80 cm RV S prime:     11.20 cm/s TAPSE (M-mode): 1.6 cm LEFT ATRIUM             Index        RIGHT ATRIUM           Index LA Vol (A2C):   89.0 ml 52.50 ml/m  RA Area:     29.10 cm LA Vol (A4C):   52.5 ml 30.97 ml/m  RA Volume:   104.00 ml 61.35 ml/m LA Biplane Vol: 73.9 ml 43.59 ml/m  AORTIC VALVE AV Area (Vmax):    1.46 cm AV Area (Vmean):   1.46 cm AV Area  (VTI):     1.55 cm AV Vmax:           202.50 cm/s AV Vmean:          149.500 cm/s AV VTI:            0.368 m AV Peak Grad:      16.4 mmHg AV Mean Grad:      11.0 mmHg LVOT Vmax:         130.00 cm/s LVOT Vmean:        96.100 cm/s LVOT VTI:          0.252 m LVOT/AV VTI ratio: 0.68  AORTA Ao Asc diam: 2.70 cm MITRAL VALVE                TRICUSPID VALVE MV Area (PHT): 4.71 cm     TR Peak grad:   42.8 mmHg MV Area VTI:   2.06 cm     TR Vmax:        327.00 cm/s MV Peak grad:  14.1 mmHg MV Mean grad:  7.8 mmHg     SHUNTS MV Vmax:       1.88 m/s     Systemic VTI:  0.25 m MV Vmean:      127.5 cm/s   Systemic Diam: 1.70 cm MV Decel Time: 161 msec  MV E velocity: 168.00 cm/s Ezra Kanner Electronically signed by Ezra Kanner Signature Date/Time: 08/14/2023/8:19:42 AM    Final    DG Chest 2 View Result Date: 08/13/2023 CLINICAL DATA:  Shortness of breath. EXAM: CHEST - 2 VIEW COMPARISON:  April 28, 2017.  April 06, 2022. FINDINGS: Stable cardiomegaly. Mild reticular densities are noted throughout both lungs which may represent scarring or possibly atypical inflammation or edema. Left upper lobe airspace opacity is noted concerning for pneumonia. Old lower thoracic compression fracture is noted. IMPRESSION: Mild reticular densities are noted throughout both lungs which may represent scarring or possibly atypical inflammation or edema. Left upper lobe airspace opacity is noted concerning for pneumonia. Followup PA and lateral chest X-ray is recommended in 3-4 weeks following trial of antibiotic therapy to ensure resolution and exclude underlying malignancy. Aortic Atherosclerosis (ICD10-I70.0). Electronically Signed   By: Lynwood Landy Raddle M.D.   On: 08/13/2023 11:28   MM 3D DIAGNOSTIC MAMMOGRAM BILATERAL BREAST Result Date: 08/03/2023 CLINICAL DATA:  Here for annual mammogram. History of left lumpectomy in 2022. EXAM: DIGITAL DIAGNOSTIC BILATERAL MAMMOGRAM WITH TOMOSYNTHESIS AND CAD; ULTRASOUND LEFT BREAST  LIMITED TECHNIQUE: Bilateral digital diagnostic mammography and breast tomosynthesis was performed. The images were evaluated with computer-aided detection. ; Targeted ultrasound examination of the left breast was performed. COMPARISON:  Previous exam(s). ACR Breast Density Category c: The breasts are heterogeneously dense, which may obscure small masses. FINDINGS: A questioned asymmetry in the left breast does not persist with additional views, likely reflecting superimposed fibroglandular tissue. Postoperative changes are seen in the left breast. No suspicious mass, microcalcification, or other finding is identified in either breast. Targeted left breast ultrasound was performed in the area of the previously questioned asymmetry from the 4-6 o'clock position. No suspicious solid or cystic mass is identified in this area. IMPRESSION: No evidence of malignancy. RECOMMENDATION: Recommend routine annual screening mammogram in 1 year. I have discussed the findings and recommendations with the patient. If applicable, a reminder letter will be sent to the patient regarding the next appointment. BI-RADS CATEGORY  2: Benign. Electronically Signed   By: Norman Hopper M.D.   On: 08/03/2023 14:00   US  LIMITED ULTRASOUND INCLUDING AXILLA LEFT BREAST  Result Date: 08/03/2023 CLINICAL DATA:  Here for annual mammogram. History of left lumpectomy in 2022. EXAM: DIGITAL DIAGNOSTIC BILATERAL MAMMOGRAM WITH TOMOSYNTHESIS AND CAD; ULTRASOUND LEFT BREAST LIMITED TECHNIQUE: Bilateral digital diagnostic mammography and breast tomosynthesis was performed. The images were evaluated with computer-aided detection. ; Targeted ultrasound examination of the left breast was performed. COMPARISON:  Previous exam(s). ACR Breast Density Category c: The breasts are heterogeneously dense, which may obscure small masses. FINDINGS: A questioned asymmetry in the left breast does not persist with additional views, likely reflecting superimposed  fibroglandular tissue. Postoperative changes are seen in the left breast. No suspicious mass, microcalcification, or other finding is identified in either breast. Targeted left breast ultrasound was performed in the area of the previously questioned asymmetry from the 4-6 o'clock position. No suspicious solid or cystic mass is identified in this area. IMPRESSION: No evidence of malignancy. RECOMMENDATION: Recommend routine annual screening mammogram in 1 year. I have discussed the findings and recommendations with the patient. If applicable, a reminder letter will be sent to the patient regarding the next appointment. BI-RADS CATEGORY  2: Benign. Electronically Signed   By: Norman Hopper M.D.   On: 08/03/2023 14:00    Microbiology: Results for orders placed or performed during the hospital encounter of 08/13/23  Resp  panel by RT-PCR (RSV, Flu A&B, Covid) Anterior Nasal Swab     Status: None   Collection Time: 08/13/23 11:46 AM   Specimen: Anterior Nasal Swab  Result Value Ref Range Status   SARS Coronavirus 2 by RT PCR NEGATIVE NEGATIVE Final    Comment: (NOTE) SARS-CoV-2 target nucleic acids are NOT DETECTED.  The SARS-CoV-2 RNA is generally detectable in upper respiratory specimens during the acute phase of infection. The lowest concentration of SARS-CoV-2 viral copies this assay can detect is 138 copies/mL. A negative result does not preclude SARS-Cov-2 infection and should not be used as the sole basis for treatment or other patient management decisions. A negative result may occur with  improper specimen collection/handling, submission of specimen other than nasopharyngeal swab, presence of viral mutation(s) within the areas targeted by this assay, and inadequate number of viral copies(<138 copies/mL). A negative result must be combined with clinical observations, patient history, and epidemiological information. The expected result is Negative.  Fact Sheet for Patients:   bloggercourse.com  Fact Sheet for Healthcare Providers:  seriousbroker.it  This test is no t yet approved or cleared by the [place]  FDA and  has been authorized for detection and/or diagnosis of SARS-CoV-2 by FDA under an Emergency Use Authorization (EUA). This EUA will remain  in effect (meaning this test can be used) for the duration of the COVID-19 declaration under Section 564(b)(1) of the Act, 21 U.S.C.section 360bbb-3(b)(1), unless the authorization is terminated  or revoked sooner.       Influenza A by PCR NEGATIVE NEGATIVE Final   Influenza B by PCR NEGATIVE NEGATIVE Final    Comment: (NOTE) The Xpert Xpress SARS-CoV-2/FLU/RSV plus assay is intended as an aid in the diagnosis of influenza from Nasopharyngeal swab specimens and should not be used as a sole basis for treatment. Nasal washings and aspirates are unacceptable for Xpert Xpress SARS-CoV-2/FLU/RSV testing.  Fact Sheet for Patients: bloggercourse.com  Fact Sheet for Healthcare Providers: seriousbroker.it  This test is not yet approved or cleared by the [place]  FDA and has been authorized for detection and/or diagnosis of SARS-CoV-2 by FDA under an Emergency Use Authorization (EUA). This EUA will remain in effect (meaning this test can be used) for the duration of the COVID-19 declaration under Section 564(b)(1) of the Act, 21 U.S.C. section 360bbb-3(b)(1), unless the authorization is terminated or revoked.     Resp Syncytial Virus by PCR NEGATIVE NEGATIVE Final    Comment: (NOTE) Fact Sheet for Patients: bloggercourse.com  Fact Sheet for Healthcare Providers: seriousbroker.it  This test is not yet approved or cleared by the [place]  FDA and has been authorized for detection and/or diagnosis of SARS-CoV-2 by FDA under an Emergency Use  Authorization (EUA). This EUA will remain in effect (meaning this test can be used) for the duration of the COVID-19 declaration under Section 564(b)(1) of the Act, 21 U.S.C. section 360bbb-3(b)(1), unless the authorization is terminated or revoked.  Performed at [facility], [address] [place], [place] [address]   Culture, blood (routine x 2)     Status: None (Preliminary result)   Collection Time: 08/13/23  2:39 PM   Specimen: BLOOD  Result Value Ref Range Status   Specimen Description   Final    BLOOD LEFT ANTECUBITAL Performed at [facility], [address] [place], [place] [address]    Special Requests   Final    Blood Culture adequate volume BOTTLES DRAWN AEROBIC AND ANAEROBIC Performed at [facility],  [address] [place], [place] [address]    Culture   Final    NO GROWTH 4 DAYS Performed at [facility], [address], [place], [place] [address]    Report Status PENDING  Incomplete  MRSA Next Gen by PCR, Nasal     Status: None   Collection Time: 08/13/23  6:58 PM   Specimen: Nasal Mucosa; Nasal Swab  Result Value Ref Range Status   MRSA by PCR Next Gen NOT DETECTED NOT DETECTED Final    Comment: (NOTE) The GeneXpert MRSA Assay (FDA approved for NASAL specimens only), is one component of a comprehensive MRSA colonization surveillance program. It is not intended to diagnose MRSA infection nor to guide or monitor treatment for MRSA infections. Test performance is not FDA approved in patients less than 3 years old. Performed at [facility], [address], [place], [place] [address]   Culture, blood (routine x 2)     Status: None (Preliminary result)   Collection Time: 08/13/23  8:28 PM   Specimen: BLOOD  Result Value Ref Range Status   Specimen Description BLOOD SITE NOT SPECIFIED  Final   Special Requests   Final    BOTTLES DRAWN AEROBIC AND ANAEROBIC Blood Culture adequate  volume   Culture   Final    NO GROWTH 4 DAYS Performed at [facility], [address], [place], [place] [address]    Report Status PENDING  Incomplete    Labs: CBC: Recent Labs  Lab 08/13/23 1146 08/14/23 0208 08/15/23 0201  WBC 11.4* 12.7* 11.8*  NEUTROABS 8.3*  --   --   HGB 8.7* 8.3* 8.5*  HCT 29.2* 27.6* 28.5*  MCV 90.4 89.6 90.2  PLT 368 371 353   Basic Metabolic Panel: Recent Labs  Lab 08/13/23 1146 08/14/23 0208 08/15/23 0201 08/16/23 0226 08/17/23 0209  NA 142 141 139 139 136  K 2.9* 3.5 4.0 4.9 4.4  CL 105 100 98 101 100  CO2 29 30 28 28 27   GLUCOSE 127* 112* 121* 111* 89  BUN 17 11 14 17 22   CREATININE 0.80 0.87 1.10* 1.17* 1.22*  CALCIUM  8.7* 8.5* 8.6* 9.5 9.3  MG  --   --  1.7 2.8*  --    Liver Function Tests: Recent Labs  Lab 08/13/23 1146  AST 11*  ALT 8  ALKPHOS 43  BILITOT 0.8  PROT 5.8*  ALBUMIN 3.5   CBG: No results for input(s): GLUCAP in the last 168 hours.  Discharge time spent: greater than 30 minutes.  Signed: Elidia Toribio Furnace, MD [facility] 08/17/2023

## 2023-08-18 LAB — CULTURE, BLOOD (ROUTINE X 2)
Culture: NO GROWTH
Culture: NO GROWTH
Special Requests: ADEQUATE
Special Requests: ADEQUATE

## 2023-08-19 ENCOUNTER — Telehealth: Payer: Self-pay | Admitting: Hematology and Oncology

## 2023-08-19 ENCOUNTER — Ambulatory Visit: Payer: Medicare HMO

## 2023-08-19 ENCOUNTER — Inpatient Hospital Stay: Payer: Medicare HMO

## 2023-08-26 ENCOUNTER — Ambulatory Visit: Payer: Medicare HMO

## 2023-08-28 ENCOUNTER — Other Ambulatory Visit: Payer: Self-pay | Admitting: Hematology and Oncology

## 2023-09-01 ENCOUNTER — Inpatient Hospital Stay: Payer: Medicare HMO

## 2023-09-01 VITALS — BP 119/67 | HR 70 | Temp 97.9°F | Resp 18

## 2023-09-01 DIAGNOSIS — C50212 Malignant neoplasm of upper-inner quadrant of left female breast: Secondary | ICD-10-CM | POA: Diagnosis present

## 2023-09-01 DIAGNOSIS — Z79899 Other long term (current) drug therapy: Secondary | ICD-10-CM | POA: Diagnosis not present

## 2023-09-01 DIAGNOSIS — D649 Anemia, unspecified: Secondary | ICD-10-CM | POA: Diagnosis not present

## 2023-09-01 DIAGNOSIS — Z170 Estrogen receptor positive status [ER+]: Secondary | ICD-10-CM | POA: Diagnosis not present

## 2023-09-01 DIAGNOSIS — Z79811 Long term (current) use of aromatase inhibitors: Secondary | ICD-10-CM | POA: Diagnosis not present

## 2023-09-01 MED ORDER — SODIUM CHLORIDE 0.9 % IV SOLN: INTRAVENOUS | Status: DC

## 2023-09-01 MED ORDER — SODIUM CHLORIDE 0.9 % IV SOLN
300.0000 mg | Freq: Once | INTRAVENOUS | Status: AC
  Administered 2023-09-01: 300 mg via INTRAVENOUS
  Filled 2023-09-01: qty 300

## 2023-09-01 NOTE — Patient Instructions (Signed)
Iron Sucrose Injection What is this medication? IRON SUCROSE (EYE ern SOO krose) treats low levels of iron (iron deficiency anemia) in people with kidney disease. Iron is a mineral that plays an important role in making red blood cells, which carry oxygen from your lungs to the rest of your body. This medicine may be used for other purposes; ask your health care provider or pharmacist if you have questions. COMMON BRAND NAME(S): Venofer What should I tell my care team before I take this medication? They need to know if you have any of these conditions: Anemia not caused by low iron levels Heart disease High levels of iron in the blood Kidney disease Liver disease An unusual or allergic reaction to iron, other medications, foods, dyes, or preservatives Pregnant or trying to get pregnant Breastfeeding How should I use this medication? This medication is for infusion into a vein. It is given in a hospital or clinic setting. Talk to your care team about the use of this medication in children. While this medication may be prescribed for children as young as 2 years for selected conditions, precautions do apply. Overdosage: If you think you have taken too much of this medicine contact a poison control center or emergency room at once. NOTE: This medicine is only for you. Do not share this medicine with others. What if I miss a dose? Keep appointments for follow-up doses. It is important not to miss your dose. Call your care team if you are unable to keep an appointment. What may interact with this medication? Do not take this medication with any of the following: Deferoxamine Dimercaprol Other iron products This medication may also interact with the following: Chloramphenicol Deferasirox This list may not describe all possible interactions. Give your health care provider a list of all the medicines, herbs, non-prescription drugs, or dietary supplements you use. Also tell them if you smoke,  drink alcohol, or use illegal drugs. Some items may interact with your medicine. What should I watch for while using this medication? Visit your care team regularly. Tell your care team if your symptoms do not start to get better or if they get worse. You may need blood work done while you are taking this medication. You may need to follow a special diet. Talk to your care team. Foods that contain iron include: whole grains/cereals, dried fruits, beans, or peas, leafy green vegetables, and organ meats (liver, kidney). What side effects may I notice from receiving this medication? Side effects that you should report to your care team as soon as possible: Allergic reactions--skin rash, itching, hives, swelling of the face, lips, tongue, or throat Low blood pressure--dizziness, feeling faint or lightheaded, blurry vision Shortness of breath Side effects that usually do not require medical attention (report to your care team if they continue or are bothersome): Flushing Headache Joint pain Muscle pain Nausea Pain, redness, or irritation at injection site This list may not describe all possible side effects. Call your doctor for medical advice about side effects. You may report side effects to FDA at 1-800-FDA-1088. Where should I keep my medication? This medication is given in a hospital or clinic. It will not be stored at home. NOTE: This sheet is a summary. It may not cover all possible information. If you have questions about this medicine, talk to your doctor, pharmacist, or health care provider.  2024 Elsevier/Gold Standard (2022-12-26 00:00:00)

## 2023-09-08 ENCOUNTER — Ambulatory Visit: Payer: Medicare HMO | Admitting: Cardiology

## 2023-09-08 ENCOUNTER — Inpatient Hospital Stay: Payer: Medicare HMO | Attending: Adult Health

## 2023-09-08 VITALS — BP 114/56 | HR 66 | Temp 98.0°F | Resp 16

## 2023-09-08 DIAGNOSIS — R5383 Other fatigue: Secondary | ICD-10-CM | POA: Diagnosis not present

## 2023-09-08 DIAGNOSIS — Z170 Estrogen receptor positive status [ER+]: Secondary | ICD-10-CM | POA: Diagnosis not present

## 2023-09-08 DIAGNOSIS — D649 Anemia, unspecified: Secondary | ICD-10-CM | POA: Insufficient documentation

## 2023-09-08 DIAGNOSIS — Z1721 Progesterone receptor positive status: Secondary | ICD-10-CM | POA: Insufficient documentation

## 2023-09-08 DIAGNOSIS — Z79899 Other long term (current) drug therapy: Secondary | ICD-10-CM | POA: Insufficient documentation

## 2023-09-08 DIAGNOSIS — C50212 Malignant neoplasm of upper-inner quadrant of left female breast: Secondary | ICD-10-CM | POA: Insufficient documentation

## 2023-09-08 DIAGNOSIS — I509 Heart failure, unspecified: Secondary | ICD-10-CM | POA: Insufficient documentation

## 2023-09-08 DIAGNOSIS — M810 Age-related osteoporosis without current pathological fracture: Secondary | ICD-10-CM | POA: Insufficient documentation

## 2023-09-08 DIAGNOSIS — R231 Pallor: Secondary | ICD-10-CM | POA: Insufficient documentation

## 2023-09-08 MED ORDER — SODIUM CHLORIDE 0.9 % IV SOLN
300.0000 mg | Freq: Once | INTRAVENOUS | Status: AC
  Administered 2023-09-08: 300 mg via INTRAVENOUS
  Filled 2023-09-08: qty 300

## 2023-09-08 MED ORDER — SODIUM CHLORIDE 0.9 % IV SOLN
INTRAVENOUS | Status: DC
Start: 2023-09-08 — End: 2023-09-08

## 2023-09-08 NOTE — Patient Instructions (Signed)
 Iron Sucrose Injection What is this medication? IRON SUCROSE (EYE ern SOO krose) treats low levels of iron (iron deficiency anemia) in people with kidney disease. Iron is a mineral that plays an important role in making red blood cells, which carry oxygen from your lungs to the rest of your body. This medicine may be used for other purposes; ask your health care provider or pharmacist if you have questions. COMMON BRAND NAME(S): Venofer What should I tell my care team before I take this medication? They need to know if you have any of these conditions: Anemia not caused by low iron levels Heart disease High levels of iron in the blood Kidney disease Liver disease An unusual or allergic reaction to iron, other medications, foods, dyes, or preservatives Pregnant or trying to get pregnant Breastfeeding How should I use this medication? This medication is for infusion into a vein. It is given in a hospital or clinic setting. Talk to your care team about the use of this medication in children. While this medication may be prescribed for children as young as 2 years for selected conditions, precautions do apply. Overdosage: If you think you have taken too much of this medicine contact a poison control center or emergency room at once. NOTE: This medicine is only for you. Do not share this medicine with others. What if I miss a dose? Keep appointments for follow-up doses. It is important not to miss your dose. Call your care team if you are unable to keep an appointment. What may interact with this medication? Do not take this medication with any of the following: Deferoxamine Dimercaprol Other iron products This medication may also interact with the following: Chloramphenicol Deferasirox This list may not describe all possible interactions. Give your health care provider a list of all the medicines, herbs, non-prescription drugs, or dietary supplements you use. Also tell them if you smoke,  drink alcohol, or use illegal drugs. Some items may interact with your medicine. What should I watch for while using this medication? Visit your care team regularly. Tell your care team if your symptoms do not start to get better or if they get worse. You may need blood work done while you are taking this medication. You may need to follow a special diet. Talk to your care team. Foods that contain iron include: whole grains/cereals, dried fruits, beans, or peas, leafy green vegetables, and organ meats (liver, kidney). What side effects may I notice from receiving this medication? Side effects that you should report to your care team as soon as possible: Allergic reactions--skin rash, itching, hives, swelling of the face, lips, tongue, or throat Low blood pressure--dizziness, feeling faint or lightheaded, blurry vision Shortness of breath Side effects that usually do not require medical attention (report to your care team if they continue or are bothersome): Flushing Headache Joint pain Muscle pain Nausea Pain, redness, or irritation at injection site This list may not describe all possible side effects. Call your doctor for medical advice about side effects. You may report side effects to FDA at 1-800-FDA-1088. Where should I keep my medication? This medication is given in a hospital or clinic. It will not be stored at home. NOTE: This sheet is a summary. It may not cover all possible information. If you have questions about this medicine, talk to your doctor, pharmacist, or health care provider.  2024 Elsevier/Gold Standard (2022-12-26 00:00:00)

## 2023-09-08 NOTE — Progress Notes (Signed)
 Patient has tolerated her iron  well multiple times- declined to stay the full 30 minute observation. Sister needed to take her home. VSS-  BP (!) 114/56 (BP Location: Left Arm)   Pulse 66   Temp 98 F (36.7 C) (Oral)   Resp 16   SpO2 99%   WC to the lobby.

## 2023-09-09 ENCOUNTER — Ambulatory Visit: Payer: Medicare HMO

## 2023-09-14 ENCOUNTER — Inpatient Hospital Stay: Payer: Medicare HMO

## 2023-09-14 DIAGNOSIS — C50212 Malignant neoplasm of upper-inner quadrant of left female breast: Secondary | ICD-10-CM | POA: Diagnosis not present

## 2023-09-14 DIAGNOSIS — D649 Anemia, unspecified: Secondary | ICD-10-CM

## 2023-09-14 LAB — IRON AND IRON BINDING CAPACITY (CC-WL,HP ONLY)
Iron: 30 ug/dL (ref 28–170)
Saturation Ratios: 11 % (ref 10.4–31.8)
TIBC: 287 ug/dL (ref 250–450)
UIBC: 257 ug/dL (ref 148–442)

## 2023-09-14 LAB — CMP (CANCER CENTER ONLY)
ALT: 5 U/L (ref 0–44)
AST: 9 U/L — ABNORMAL LOW (ref 15–41)
Albumin: 3.4 g/dL — ABNORMAL LOW (ref 3.5–5.0)
Alkaline Phosphatase: 54 U/L (ref 38–126)
Anion gap: 7 (ref 5–15)
BUN: 21 mg/dL (ref 8–23)
CO2: 31 mmol/L (ref 22–32)
Calcium: 9.3 mg/dL (ref 8.9–10.3)
Chloride: 101 mmol/L (ref 98–111)
Creatinine: 1.11 mg/dL — ABNORMAL HIGH (ref 0.44–1.00)
GFR, Estimated: 48 mL/min — ABNORMAL LOW (ref >60)
Glucose, Bld: 120 mg/dL — ABNORMAL HIGH (ref 70–99)
Potassium: 3 mmol/L — ABNORMAL LOW (ref 3.5–5.1)
Sodium: 139 mmol/L (ref 135–145)
Total Bilirubin: 0.4 mg/dL (ref 0.0–1.2)
Total Protein: 5.9 g/dL — ABNORMAL LOW (ref 6.5–8.1)

## 2023-09-14 LAB — RETIC PANEL
Immature Retic Fract: 29.6 % — ABNORMAL HIGH (ref 2.3–15.9)
RBC.: 2.66 MIL/uL — ABNORMAL LOW (ref 3.87–5.11)
Retic Count, Absolute: 132.2 10*3/uL (ref 19.0–186.0)
Retic Ct Pct: 5 % — ABNORMAL HIGH (ref 0.4–3.1)
Reticulocyte Hemoglobin: 26 pg — ABNORMAL LOW (ref >27.9)

## 2023-09-14 LAB — CBC WITH DIFFERENTIAL (CANCER CENTER ONLY)
Abs Immature Granulocytes: 0.05 10*3/uL (ref 0.00–0.07)
Basophils Absolute: 0.1 10*3/uL (ref 0.0–0.1)
Basophils Relative: 1 %
Eosinophils Absolute: 0.2 10*3/uL (ref 0.0–0.5)
Eosinophils Relative: 2 %
HCT: 24.9 % — ABNORMAL LOW (ref 36.0–46.0)
Hemoglobin: 7.2 g/dL — ABNORMAL LOW (ref 12.0–15.0)
Immature Granulocytes: 1 %
Lymphocytes Relative: 15 %
Lymphs Abs: 1.5 10*3/uL (ref 0.7–4.0)
MCH: 27 pg (ref 26.0–34.0)
MCHC: 28.9 g/dL — ABNORMAL LOW (ref 30.0–36.0)
MCV: 93.3 fL (ref 80.0–100.0)
Monocytes Absolute: 0.6 10*3/uL (ref 0.1–1.0)
Monocytes Relative: 6 %
Neutro Abs: 7.3 10*3/uL (ref 1.7–7.7)
Neutrophils Relative %: 75 %
Platelet Count: 331 10*3/uL (ref 150–400)
RBC: 2.67 MIL/uL — ABNORMAL LOW (ref 3.87–5.11)
RDW: 18.3 % — ABNORMAL HIGH (ref 11.5–15.5)
WBC Count: 9.7 10*3/uL (ref 4.0–10.5)
nRBC: 0 % (ref 0.0–0.2)

## 2023-09-14 LAB — FERRITIN: Ferritin: 139 ng/mL (ref 11–307)

## 2023-09-16 ENCOUNTER — Inpatient Hospital Stay (HOSPITAL_BASED_OUTPATIENT_CLINIC_OR_DEPARTMENT_OTHER): Payer: Medicare HMO | Admitting: Hematology and Oncology

## 2023-09-16 DIAGNOSIS — C50212 Malignant neoplasm of upper-inner quadrant of left female breast: Secondary | ICD-10-CM | POA: Diagnosis not present

## 2023-09-16 DIAGNOSIS — Z170 Estrogen receptor positive status [ER+]: Secondary | ICD-10-CM | POA: Diagnosis not present

## 2023-09-16 DIAGNOSIS — D649 Anemia, unspecified: Secondary | ICD-10-CM

## 2023-09-16 NOTE — Progress Notes (Signed)
HEMATOLOGY-ONCOLOGY TELEPHONE VISIT PROGRESS NOTE  I connected with our patient on 09/16/23 at 10:30 AM EST by telephone and verified that I am speaking with the correct person using two identifiers.  I discussed the limitations, risks, security and privacy concerns of performing an evaluation and management service by telephone and the availability of in person appointments.  I also discussed with the patient that there may be a patient responsible charge related to this service. The patient expressed understanding and agreed to proceed.   History of Present Illness: Telephone follow-up for anemia  History of Present Illness Ms. Mallory Stephens is a 89 year old with history of anemia with mild iron deficiency and she was given IV iron infusions.  She was recently in the hospital with congestive heart failure and community-acquired pneumonia.  Her hemoglobin had declined in the hospitalization.  We connected by phone to discuss the treatment plan.        Oncology History  Malignant neoplasm of upper-inner quadrant of left breast in female, estrogen receptor positive (HCC)  07/24/2020 Initial Diagnosis   07/24/2020: Left breast UIQ T1CN0 stage Ia grade 2 IDC ER/PR positive HER2 negative Ki-67 10%    08/23/2020 Cancer Staging   Staging form: Breast, AJCC 8th Edition - Clinical stage from 08/23/2020: Stage IA (cT1c, cN0, cM0, G2, ER+, PR+, HER2-) - Signed by Lowella Dell, MD on 08/29/2020   09/06/2020 Surgery   Left lumpectomy: T1c, Nx grade 2 IDC, negative margins, declined radiation   10/2020 -  Anti-estrogen oral therapy   Anastrozole   11/26/2020 Genetic Testing   Negative genetic testing:  No pathogenic variants detected on the [employer] CancerNext-Expanded + RNAinsight panel. A variant of uncertain significance (VUS) was detected in the MSH6 gene called p.[id number] (c.3930G>C). The report date is 11/26/2020.  The CancerNext-Expanded + RNAinsight gene panel offered by [employer] and includes  sequencing and rearrangement analysis for the following 77 genes: AIP, ALK, APC, ATM, AXIN2, BAP1, BARD1, BLM, BMPR1A, BRCA1, BRCA2, BRIP1, CDC73, CDH1, CDK4, CDKN1B, CDKN2A, CHEK2, CTNNA1, DICER1, FANCC, FH, FLCN, GALNT12, KIF1B, LZTR1, MAX, MEN1, MET, MLH1, MSH2, MSH3, MSH6, MUTYH, NBN, NF1, NF2, NTHL1, PALB2, PHOX2B, PMS2, POT1, PRKAR1A, PTCH1, PTEN, RAD51C, RAD51D, RB1, RECQL, RET, SDHA, SDHAF2, SDHB, SDHC, SDHD, SMAD4, SMARCA4, SMARCB1, SMARCE1, STK11, SUFU, TMEM127, TP53, TSC1, TSC2, VHL and XRCC2 (sequencing and deletion/duplication); EGFR, EGLN1, HOXB13, KIT, MITF, PDGFRA, POLD1 and POLE (sequencing only); EPCAM and GREM1 (deletion/duplication only). RNA data is routinely analyzed for use in variant interpretation for all genes.      REVIEW OF SYSTEMS:   Constitutional: Denies fevers, chills or abnormal weight loss All other systems were reviewed with the patient and are negative. Observations/Objective:     Assessment Plan:  Malignant neoplasm of upper-inner quadrant of left breast in female, estrogen receptor positive (HCC) 07/24/2020: Left breast UIQ T1CN0 stage Ia grade 2 IDC ER/PR positive HER2 negative Ki-67 10% 09/06/2020: Left lumpectomy: T1CNX grade 2 IDC with negative margins, opted against radiation 11/26/2020: Genetics: Negative   Current treatment: Anastrozole started 10/02/2020 Anastrozole toxicities: Osteoporosis: T score -2.5 on 04/22/2021 currently on ibandronate   Breast cancer surveillance: Mammogram 07/30/2022: Benign breast density category C CT AP for abdominal pain: 04/06/2022: Diverticulosis  Chronic anemia Persistent anemia with hemoglobin levels in the 8s, despite extensive workup including iron, B12, folic acid levels, and myeloma screening. No clear cause identified. Patient reports fatigue, weakness, and pallor. -Plan to administer intravenous iron (Monoferric if approved, otherwise Feraheme) to potentially increase hemoglobin levels. -Check labs in six  weeks  to assess response to treatment. -Consider Retacrit injection if no improvement with IV iron.  Lab review: 09/14/2023: Hemoglobin 7.2, MCV 93.3, RDW 18.3, iron saturation 11%, creatinine 1.11, reticulocytes: 5%, ferritin 139 Hospitalization 08/13/2023-08/17/2023: CHF exacerbation and community-acquired pneumonia We discussed that the hospitalization may be the reason for the drop in the hemoglobin Before we decided on Retacrit injections or bone marrow biopsies, we decided to watch and wait with another recheck of labs in 1 month.   I discussed the assessment and treatment plan with the patient. The patient was provided an opportunity to ask questions and all were answered. The patient agreed with the plan and demonstrated an understanding of the instructions. The patient was advised to call back or seek an in-person evaluation if the symptoms worsen or if the condition fails to improve as anticipated.   I provided 20 minutes of non-face-to-face time during this encounter.  This includes time for charting and coordination of care   Mallory Meek, MD

## 2023-09-16 NOTE — Assessment & Plan Note (Signed)
07/24/2020: Left breast UIQ T1CN0 stage Ia grade 2 IDC ER/PR positive HER2 negative Ki-67 10% 09/06/2020: Left lumpectomy: T1CNX grade 2 IDC with negative margins, opted against radiation 11/26/2020: Genetics: Negative   Current treatment: Anastrozole started 10/02/2020 Anastrozole toxicities: Osteoporosis: T score -2.5 on 04/22/2021 currently on ibandronate   Breast cancer surveillance: Mammogram 07/30/2022: Benign breast density category C CT AP for abdominal pain: 04/06/2022: Diverticulosis

## 2023-09-16 NOTE — Assessment & Plan Note (Signed)
Persistent anemia with hemoglobin levels in the 8s, despite extensive workup including iron, B12, folic acid levels, and myeloma screening. No clear cause identified. Patient reports fatigue, weakness, and pallor. -Plan to administer intravenous iron (Monoferric if approved, otherwise Feraheme) to potentially increase hemoglobin levels. -Check labs in six weeks to assess response to treatment. -Consider Retacrit injection if no improvement with IV iron.  Lab review: 09/14/2023: Hemoglobin 7.2, MCV 93.3, RDW 18.3, iron saturation 11%, creatinine 1.11, reticulocytes: 5%, ferritin 139 I recommend that we obtain a bone marrow biopsy for further evaluation versus starting patient on erythropoietin stimulating agents

## 2023-09-18 ENCOUNTER — Other Ambulatory Visit: Payer: Self-pay

## 2023-09-18 ENCOUNTER — Inpatient Hospital Stay (HOSPITAL_BASED_OUTPATIENT_CLINIC_OR_DEPARTMENT_OTHER)
Admission: EM | Admit: 2023-09-18 | Discharge: 2023-09-26 | DRG: 330 | Disposition: A | Discharge status: Skilled Nursing Facility | Payer: Medicare HMO | Attending: Internal Medicine | Admitting: Internal Medicine

## 2023-09-18 ENCOUNTER — Encounter (HOSPITAL_BASED_OUTPATIENT_CLINIC_OR_DEPARTMENT_OTHER): Payer: Self-pay

## 2023-09-18 ENCOUNTER — Emergency Department (HOSPITAL_BASED_OUTPATIENT_CLINIC_OR_DEPARTMENT_OTHER): Payer: Medicare HMO

## 2023-09-18 DIAGNOSIS — I110 Hypertensive heart disease with heart failure: Secondary | ICD-10-CM | POA: Diagnosis not present

## 2023-09-18 DIAGNOSIS — N189 Chronic kidney disease, unspecified: Secondary | ICD-10-CM | POA: Diagnosis present

## 2023-09-18 DIAGNOSIS — C180 Malignant neoplasm of cecum: Secondary | ICD-10-CM | POA: Diagnosis not present

## 2023-09-18 DIAGNOSIS — Z8701 Personal history of pneumonia (recurrent): Secondary | ICD-10-CM

## 2023-09-18 DIAGNOSIS — K922 Gastrointestinal hemorrhage, unspecified: Secondary | ICD-10-CM | POA: Diagnosis present

## 2023-09-18 DIAGNOSIS — D649 Anemia, unspecified: Secondary | ICD-10-CM

## 2023-09-18 DIAGNOSIS — D62 Acute posthemorrhagic anemia: Secondary | ICD-10-CM | POA: Diagnosis present

## 2023-09-18 DIAGNOSIS — F17210 Nicotine dependence, cigarettes, uncomplicated: Secondary | ICD-10-CM | POA: Diagnosis present

## 2023-09-18 DIAGNOSIS — I130 Hypertensive heart and chronic kidney disease with heart failure and stage 1 through stage 4 chronic kidney disease, or unspecified chronic kidney disease: Secondary | ICD-10-CM | POA: Diagnosis present

## 2023-09-18 DIAGNOSIS — Z8249 Family history of ischemic heart disease and other diseases of the circulatory system: Secondary | ICD-10-CM | POA: Diagnosis not present

## 2023-09-18 DIAGNOSIS — K921 Melena: Secondary | ICD-10-CM | POA: Diagnosis present

## 2023-09-18 DIAGNOSIS — K219 Gastro-esophageal reflux disease without esophagitis: Secondary | ICD-10-CM | POA: Diagnosis present

## 2023-09-18 DIAGNOSIS — Z882 Allergy status to sulfonamides status: Secondary | ICD-10-CM | POA: Diagnosis not present

## 2023-09-18 DIAGNOSIS — Z803 Family history of malignant neoplasm of breast: Secondary | ICD-10-CM

## 2023-09-18 DIAGNOSIS — K5730 Diverticulosis of large intestine without perforation or abscess without bleeding: Secondary | ICD-10-CM | POA: Diagnosis present

## 2023-09-18 DIAGNOSIS — Z853 Personal history of malignant neoplasm of breast: Secondary | ICD-10-CM

## 2023-09-18 DIAGNOSIS — Z9181 History of falling: Secondary | ICD-10-CM

## 2023-09-18 DIAGNOSIS — I509 Heart failure, unspecified: Secondary | ICD-10-CM | POA: Diagnosis not present

## 2023-09-18 DIAGNOSIS — D6832 Hemorrhagic disorder due to extrinsic circulating anticoagulants: Secondary | ICD-10-CM | POA: Diagnosis present

## 2023-09-18 DIAGNOSIS — E785 Hyperlipidemia, unspecified: Secondary | ICD-10-CM | POA: Diagnosis present

## 2023-09-18 DIAGNOSIS — Z79811 Long term (current) use of aromatase inhibitors: Secondary | ICD-10-CM

## 2023-09-18 DIAGNOSIS — F32A Depression, unspecified: Secondary | ICD-10-CM | POA: Diagnosis present

## 2023-09-18 DIAGNOSIS — Z8711 Personal history of peptic ulcer disease: Secondary | ICD-10-CM

## 2023-09-18 DIAGNOSIS — Z85038 Personal history of other malignant neoplasm of large intestine: Secondary | ICD-10-CM

## 2023-09-18 DIAGNOSIS — I4821 Permanent atrial fibrillation: Secondary | ICD-10-CM | POA: Diagnosis present

## 2023-09-18 DIAGNOSIS — K6389 Other specified diseases of intestine: Secondary | ICD-10-CM | POA: Diagnosis not present

## 2023-09-18 DIAGNOSIS — K660 Peritoneal adhesions (postprocedural) (postinfection): Secondary | ICD-10-CM | POA: Diagnosis present

## 2023-09-18 DIAGNOSIS — K567 Ileus, unspecified: Secondary | ICD-10-CM | POA: Diagnosis present

## 2023-09-18 DIAGNOSIS — I272 Pulmonary hypertension, unspecified: Secondary | ICD-10-CM | POA: Diagnosis present

## 2023-09-18 DIAGNOSIS — Z66 Do not resuscitate: Secondary | ICD-10-CM | POA: Diagnosis present

## 2023-09-18 DIAGNOSIS — Z923 Personal history of irradiation: Secondary | ICD-10-CM | POA: Diagnosis not present

## 2023-09-18 DIAGNOSIS — Z7901 Long term (current) use of anticoagulants: Secondary | ICD-10-CM | POA: Diagnosis not present

## 2023-09-18 DIAGNOSIS — I5032 Chronic diastolic (congestive) heart failure: Secondary | ICD-10-CM | POA: Diagnosis present

## 2023-09-18 DIAGNOSIS — Z8042 Family history of malignant neoplasm of prostate: Secondary | ICD-10-CM

## 2023-09-18 DIAGNOSIS — Z85828 Personal history of other malignant neoplasm of skin: Secondary | ICD-10-CM | POA: Diagnosis not present

## 2023-09-18 DIAGNOSIS — Z8261 Family history of arthritis: Secondary | ICD-10-CM

## 2023-09-18 DIAGNOSIS — E876 Hypokalemia: Secondary | ICD-10-CM | POA: Diagnosis present

## 2023-09-18 DIAGNOSIS — I10 Essential (primary) hypertension: Secondary | ICD-10-CM | POA: Diagnosis present

## 2023-09-18 DIAGNOSIS — R531 Weakness: Secondary | ICD-10-CM | POA: Diagnosis present

## 2023-09-18 DIAGNOSIS — I4891 Unspecified atrial fibrillation: Secondary | ICD-10-CM | POA: Diagnosis not present

## 2023-09-18 DIAGNOSIS — Z79899 Other long term (current) drug therapy: Secondary | ICD-10-CM

## 2023-09-18 DIAGNOSIS — Z823 Family history of stroke: Secondary | ICD-10-CM

## 2023-09-18 LAB — COMPREHENSIVE METABOLIC PANEL
ALT: 5 U/L (ref 0–44)
AST: 19 U/L (ref 15–41)
Albumin: 3.3 g/dL — ABNORMAL LOW (ref 3.5–5.0)
Alkaline Phosphatase: 46 U/L (ref 38–126)
Anion gap: 8 (ref 5–15)
BUN: 15 mg/dL (ref 8–23)
CO2: 29 mmol/L (ref 22–32)
Calcium: 9.5 mg/dL (ref 8.9–10.3)
Chloride: 104 mmol/L (ref 98–111)
Creatinine, Ser: 0.81 mg/dL (ref 0.44–1.00)
GFR, Estimated: >60 mL/min (ref >60)
Glucose, Bld: 97 mg/dL (ref 70–99)
Potassium: 3.9 mmol/L (ref 3.5–5.1)
Sodium: 141 mmol/L (ref 135–145)
Total Bilirubin: 0.7 mg/dL (ref 0.0–1.2)
Total Protein: 5.8 g/dL — ABNORMAL LOW (ref 6.5–8.1)

## 2023-09-18 LAB — URINALYSIS, ROUTINE W REFLEX MICROSCOPIC
Bacteria, UA: NONE SEEN
Bilirubin Urine: NEGATIVE
Glucose, UA: NEGATIVE mg/dL
Hgb urine dipstick: NEGATIVE
Ketones, ur: NEGATIVE mg/dL
Leukocytes,Ua: NEGATIVE
Nitrite: NEGATIVE
Protein, ur: 30 mg/dL — AB
Specific Gravity, Urine: >1.046 — ABNORMAL HIGH (ref 1.005–1.030)
pH: 6 (ref 5.0–8.0)

## 2023-09-18 LAB — HEMOGLOBIN AND HEMATOCRIT, BLOOD
HCT: 26.4 % — ABNORMAL LOW (ref 36.0–46.0)
Hemoglobin: 7.1 g/dL — ABNORMAL LOW (ref 12.0–15.0)

## 2023-09-18 LAB — CBC
HCT: 25 % — ABNORMAL LOW (ref 36.0–46.0)
Hemoglobin: 7.3 g/dL — ABNORMAL LOW (ref 12.0–15.0)
MCH: 26.7 pg (ref 26.0–34.0)
MCHC: 29.2 g/dL — ABNORMAL LOW (ref 30.0–36.0)
MCV: 91.6 fL (ref 80.0–100.0)
Platelets: 369 10*3/uL (ref 150–400)
RBC: 2.73 MIL/uL — ABNORMAL LOW (ref 3.87–5.11)
RDW: 18.3 % — ABNORMAL HIGH (ref 11.5–15.5)
WBC: 9.4 10*3/uL (ref 4.0–10.5)
nRBC: 0 % (ref 0.0–0.2)

## 2023-09-18 LAB — TROPONIN I (HIGH SENSITIVITY): Troponin I (High Sensitivity): 11 ng/L (ref <18)

## 2023-09-18 LAB — PREPARE RBC (CROSSMATCH)

## 2023-09-18 LAB — OCCULT BLOOD X 1 CARD TO LAB, STOOL: Fecal Occult Bld: POSITIVE — AB

## 2023-09-18 LAB — LIPASE, BLOOD: Lipase: 14 U/L (ref 11–51)

## 2023-09-18 LAB — ABO/RH: ABO/RH(D): A NEG

## 2023-09-18 MED ORDER — ONDANSETRON HCL 4 MG/2ML IJ SOLN
4.0000 mg | Freq: Four times a day (QID) | INTRAMUSCULAR | Status: DC | PRN
  Administered 2023-09-21 – 2023-09-23 (×5): 4 mg via INTRAVENOUS
  Filled 2023-09-18 (×5): qty 2

## 2023-09-18 MED ORDER — ACETAMINOPHEN 325 MG PO TABS: 650.0000 mg | ORAL_TABLET | Freq: Four times a day (QID) | ORAL | Status: DC | PRN

## 2023-09-18 MED ORDER — PAROXETINE HCL 10 MG PO TABS
10.0000 mg | ORAL_TABLET | ORAL | Status: DC
  Administered 2023-09-19 – 2023-09-26 (×8): 10 mg via ORAL
  Filled 2023-09-18 (×8): qty 1

## 2023-09-18 MED ORDER — ALBUTEROL SULFATE (2.5 MG/3ML) 0.083% IN NEBU: 2.5000 mg | INHALATION_SOLUTION | RESPIRATORY_TRACT | Status: DC | PRN

## 2023-09-18 MED ORDER — ANASTROZOLE 1 MG PO TABS
1.0000 mg | ORAL_TABLET | Freq: Every morning | ORAL | Status: DC
  Administered 2023-09-19 – 2023-09-26 (×8): 1 mg via ORAL
  Filled 2023-09-18 (×8): qty 1

## 2023-09-18 MED ORDER — FAMOTIDINE 20 MG PO TABS
40.0000 mg | ORAL_TABLET | Freq: Every day | ORAL | Status: DC
  Administered 2023-09-18 – 2023-09-25 (×8): 40 mg via ORAL
  Filled 2023-09-18 (×8): qty 2

## 2023-09-18 MED ORDER — ONDANSETRON HCL 4 MG PO TABS: 4.0000 mg | ORAL_TABLET | Freq: Four times a day (QID) | ORAL | Status: DC | PRN

## 2023-09-18 MED ORDER — METOPROLOL TARTRATE 25 MG PO TABS
25.0000 mg | ORAL_TABLET | Freq: Two times a day (BID) | ORAL | Status: DC
  Administered 2023-09-18 – 2023-09-26 (×15): 25 mg via ORAL
  Filled 2023-09-18 (×16): qty 1

## 2023-09-18 MED ORDER — FESOTERODINE FUMARATE ER 4 MG PO TB24
4.0000 mg | ORAL_TABLET | Freq: Every day | ORAL | Status: DC
  Administered 2023-09-19 – 2023-09-26 (×8): 4 mg via ORAL
  Filled 2023-09-18 (×8): qty 1

## 2023-09-18 MED ORDER — LOSARTAN POTASSIUM 50 MG PO TABS
25.0000 mg | ORAL_TABLET | Freq: Every day | ORAL | Status: DC
  Administered 2023-09-18 – 2023-09-19 (×2): 25 mg via ORAL
  Filled 2023-09-18 (×2): qty 1

## 2023-09-18 MED ORDER — IOHEXOL 350 MG/ML SOLN
100.0000 mL | Freq: Once | INTRAVENOUS | Status: AC | PRN
  Administered 2023-09-18: 80 mL via INTRAVENOUS

## 2023-09-18 MED ORDER — ACETAMINOPHEN 650 MG RE SUPP: 650.0000 mg | Freq: Four times a day (QID) | RECTAL | Status: DC | PRN

## 2023-09-18 MED ORDER — PRAVASTATIN SODIUM 20 MG PO TABS
20.0000 mg | ORAL_TABLET | Freq: Every day | ORAL | Status: DC
  Administered 2023-09-18 – 2023-09-25 (×8): 20 mg via ORAL
  Filled 2023-09-18 (×8): qty 1

## 2023-09-18 MED ORDER — SODIUM CHLORIDE 0.9% IV SOLUTION: Freq: Once | INTRAVENOUS | Status: AC

## 2023-09-18 NOTE — ED Triage Notes (Signed)
States having epigastric pain and blood in stool onset yesterday.  Nausea and vomiting.  Blood work from MD showing low hemoglobin.  CA patient Breast CA.

## 2023-09-18 NOTE — ED Notes (Signed)
Called [facility] for transport 15:03

## 2023-09-18 NOTE — H&P (Signed)
 History and Physical  Mallory Stephens:[id number] DOB: 1933-06-16 DOA: 09/18/2023  PCP: Mila Palmer, MD   Chief Complaint: Hematochezia  HPI: Mallory Stephens is a 89 y.o. female with medical history significant for breast cancer, GERD, hypertension, permanent atrial fibrillation on Eliquis being admitted to the hospital with blood loss anemia and concern for lower GI bleed.  She has no prior history of GI bleeding, though she has about a year long history of iron deficiency anemia.  For the last 48 hours, she has had several maroon stools, associated nausea and vomiting.  Last bowel movement was yesterday morning, was maroon.  Her abdominal discomfort and nausea have now resolved.  Last dose of Eliquis was on the morning of 2/13.  Routine lab work on the morning of 2/10 showed hemoglobin of 7.2 down from her normal baseline of about 8.5.  She discussed with her hematologist Dr. Pamelia Hoit, decision was made to hold off on transfusion.  Repeat blood work today shows relatively stable hemoglobin 7.3.   Review of Systems: Please see HPI for pertinent positives and negatives. A complete 10 system review of systems are otherwise negative.  Past Medical History:  Diagnosis Date   Breast cancer (HCC) 07/24/2020   Dyslipidemia    Dysrhythmia    atrial fibrillation   Edema extremities 09/03/2016   Family history of breast cancer    Family history of prostate cancer    Fatty liver    GERD (gastroesophageal reflux disease)    History of basal cell carcinoma    Hypertension    Lymphocytosis    followed by Dr. Arbutus Ped   Multinodular goiter    Osteoarthritis    Permanent atrial fibrillation (HCC)    atrial fibrillation s/p DCCV 3/10 and 6/10   PONV (postoperative nausea and vomiting)    PUD (peptic ulcer disease)    Pulmonary nodules    followed  by Dr. Paulino Rily   UTI (urinary tract infection) 04/2017   Past Surgical History:  Procedure Laterality Date   aspiration of cyst  1978   BREAST  LUMPECTOMY Left 09/2020   BREAST LUMPECTOMY WITH RADIOACTIVE SEED LOCALIZATION Left 09/06/2020   Procedure: LEFT BREAST LUMPECTOMY WITH RADIOACTIVE SEED LOCALIZATION;  Surgeon: Almond Lint, MD;  Location: [facility] OR;  Service: General;  Laterality: Left;   BREAST SURGERY  1962   right breast for benign lesion   BREAST SURGERY  1971   bilateral lumpectomy for beingn lesions   CARDIOVERSION  07/22/2011   Procedure: CARDIOVERSION;  Surgeon: Quintella Reichert, MD;  Location: [facility] OR;  Service: Cardiovascular;  Laterality: N/A;   CARDIOVERSION  09/02/2011   Procedure: CARDIOVERSION;  Surgeon: Quintella Reichert, MD;  Location: [facility] OR;  Service: Cardiovascular;  Laterality: N/A;   cataract     cataract removal-bilateral   CHOLECYSTECTOMY  2003   CYSTOSCOPY     DILATION AND CURETTAGE OF UTERUS  1979   JOINT REPLACEMENT  2002   Right knee   JOINT REPLACEMENT  2007   left knee   Social History:  reports that she has quit smoking. Her smoking use included cigarettes. She has a 30 pack-year smoking history. She has never used smokeless tobacco. She reports current alcohol use. She reports that she does not use drugs.  Allergies  Allergen Reactions   Sulfa Antibiotics Rash   Sulfa Drugs Cross Reactors Rash    Family History  Problem Relation Age of Onset   Stroke Mother    Stroke Father  Breast cancer Sister        dx 53s   Arthritis Sister    Scoliosis Sister    Heart disease Brother    Prostate cancer Brother        dx >50, metastatic   Prostate cancer Brother        dx >50, metastatic   Prostate cancer Nephew        dx <50   Breast cancer Niece        dx <50   Breast cancer Niece        dx <50   Autoimmune disease Neg Hx      Prior to Admission medications   Medication Sig Start Date End Date Taking? Authorizing Provider  acetaminophen (TYLENOL) 325 MG tablet Take 325 mg by mouth every 6 (six) hours as needed for moderate pain.   Yes [provider]  anastrozole (ARIMIDEX)  1 MG tablet TAKE 1 TABLET DAILY Patient taking differently: Take 1 mg by mouth in the morning. 08/28/23  Yes Serena Croissant, MD  cholecalciferol (VITAMIN D3) 25 MCG (1000 UNIT) tablet Take 1 tablet (1,000 Units total) by mouth daily. 11/01/20  Yes Magrinat, Valentino Hue, MD  ELIQUIS 5 MG TABS tablet TAKE 1 TABLET TWICE A DAY 08/10/23  Yes Turner, Cornelious Bryant, MD  famotidine (PEPCID) 40 MG tablet Take 40 mg by mouth at bedtime. 12/29/18  Yes [provider]  furosemide (LASIX) 20 MG tablet Take 20 mg by mouth in the morning.   Yes [provider]  ibandronate (BONIVA) 150 MG tablet TAKE 1 TABLET BY MOUTH EVERY 30 DAYS ON THE 10TH DAY OF EACH MONTH IN THE MORNING WITH A FULL GLASS OF WATER,ON AN EMPTY STOMACH AND DO NOT TAKE ANYTHING ELSE BY MOUTH OR LIE DOWN FOR THE NEXT 30 MIN Patient taking differently: Take 150 mg by mouth every 30 (thirty) days. 07/27/23  Yes Serena Croissant, MD  losartan (COZAAR) 25 MG tablet Take 1 tablet (25 mg total) by mouth daily. 10/29/22 10/29/23 Yes Meredeth Ide, MD  metoprolol tartrate (LOPRESSOR) 25 MG tablet TAKE 1 TABLET TWICE A DAY 10/30/22  Yes Turner, Traci R, MD  PARoxetine (PAXIL) 10 MG tablet Take 10 mg by mouth every morning.   Yes [provider]  polyvinyl alcohol (LIQUIFILM TEARS) 1.4 % ophthalmic solution Place 1-2 drops into both eyes every 6 (six) hours as needed for dry eyes.   Yes [provider]  pravastatin (PRAVACHOL) 20 MG tablet Take 20 mg by mouth at bedtime.   Yes [provider]  solifenacin (VESICARE) 5 MG tablet Take 5 mg by mouth in the morning.   Yes [provider]  furosemide (LASIX) 40 MG tablet Take 1 tablet (40 mg total) by mouth daily. Patient not taking: Reported on 09/18/2023 08/17/23   Arrien, York Ram, MD    Physical Exam: BP (!) 156/68 (BP Location: Right Arm)   Pulse 94   Temp 98.1 F (36.7 C) (Oral)   Resp 19   Ht 5\' 5"  (1.651 m)   Wt 56.7 kg   SpO2 97%   BMI 20.80 kg/m   General:  Alert, oriented, calm, in no acute distress, her daughter is at the bedside.  She looks slightly pale but comfortable. Eyes: EOMI, clear conjuctivae, white sclerea Neck: supple, no masses, trachea mildline  Cardiovascular: Irregularly irregular, no murmurs or rubs, no peripheral edema  Respiratory: clear to auscultation bilaterally, no wheezes, no crackles  Abdomen: soft, nontender, nondistended, normal  bowel tones heard  Skin: dry, no rashes  Musculoskeletal: no joint effusions, normal range of motion  Psychiatric: appropriate affect, normal speech  Neurologic: extraocular muscles intact, clear speech, moving all extremities with intact sensorium         Labs on Admission:  Basic Metabolic Panel: Recent Labs  Lab 09/14/23 1052 09/18/23 1039  NA 139 141  K 3.0* 3.9  CL 101 104  CO2 31 29  GLUCOSE 120* 97  BUN 21 15  CREATININE 1.11* 0.81  CALCIUM 9.3 9.5   Liver Function Tests: Recent Labs  Lab 09/14/23 1052 09/18/23 1039  AST 9* 19  ALT 5 5  ALKPHOS 54 46  BILITOT 0.4 0.7  PROT 5.9* 5.8*  ALBUMIN 3.4* 3.3*   Recent Labs  Lab 09/18/23 1039  LIPASE 14   No results for input(s): "AMMONIA" in the last 168 hours. CBC: Recent Labs  Lab 09/14/23 1052 09/18/23 1039  WBC 9.7 9.4  NEUTROABS 7.3  --   HGB 7.2* 7.3*  HCT 24.9* 25.0*  MCV 93.3 91.6  PLT 331 369   Cardiac Enzymes: No results for input(s): "CKTOTAL", "CKMB", "CKMBINDEX", "TROPONINI" in the last 168 hours. BNP (last 3 results) Recent Labs    10/25/22 0804 08/13/23 1146  BNP 613.0* 897.0*    ProBNP (last 3 results) No results for input(s): "PROBNP" in the last 8760 hours.  CBG: No results for input(s): "GLUCAP" in the last 168 hours.  Radiological Exams on Admission: CT ANGIO GI BLEED Result Date: 09/18/2023 CLINICAL DATA:  Lower GI bleed (Ped 0-17y) lower GI bleeding, mild abd discomfort. Lower abdominal pain. Bloody stools. Low hemoglobin. EXAM: CTA ABDOMEN AND PELVIS WITHOUT  AND WITH CONTRAST TECHNIQUE: Multidetector CT imaging of the abdomen and pelvis was performed using the standard protocol during bolus administration of intravenous contrast. Multiplanar reconstructed images and MIPs were obtained and reviewed to evaluate the vascular anatomy. RADIATION DOSE REDUCTION: This exam was performed according to the departmental dose-optimization program which includes automated exposure control, adjustment of the mA and/or kV according to patient size and/or use of iterative reconstruction technique. CONTRAST:  80mL OMNIPAQUE IOHEXOL 350 MG/ML SOLN COMPARISON:  CT scan abdomen and pelvis from 04/06/2022. FINDINGS: VASCULAR Aorta: Normal caliber aorta without aneurysm, dissection, vasculitis or significant stenosis. Celiac: Patent without evidence of aneurysm, dissection, vasculitis or significant stenosis. There is mild narrowing at the origin to calcified plaques. SMA: Patent without evidence of aneurysm, dissection, vasculitis or significant stenosis. There is mild-to-moderate narrowing at the origin due to calcified plaques. Renals: Both renal arteries are patent without evidence of aneurysm, dissection, vasculitis, fibromuscular dysplasia or significant stenosis. IMA: Patent without evidence of aneurysm, dissection, vasculitis or significant stenosis. Inflow: Patent without evidence of aneurysm, dissection, vasculitis or significant stenosis. Proximal Outflow: Bilateral common femoral and visualized portions of the superficial and profunda femoral arteries are patent without evidence of aneurysm, dissection, vasculitis or significant stenosis. Veins: No obvious venous abnormality within the limitations of this arterial phase study. Review of the MIP images confirms the above findings. NON-VASCULAR Lower chest: Redemonstration of peripheral subpleural reticulations in the visualized bilateral lung bases. However, there is superimposed smooth interlobular septal thickening and bilateral  trace pleural effusions, concerning for congestive heart failure/pulmonary edema. There is a 1 cm sized calcified granuloma in the right lung lower lobe. The lung bases are otherwise clear. No pleural effusion. The heart is normal in size. No pericardial effusion. Hepatobiliary: The liver is normal in size. Non-cirrhotic configuration. No suspicious mass. No  intrahepatic bile duct dilation. There is mild prominence of the extrahepatic bile duct, most likely due to post cholecystectomy status. Gallbladder is surgically absent. Pancreas: Unremarkable. No pancreatic ductal dilatation or surrounding inflammatory changes. Spleen: Within normal limits. No focal lesion. Adrenals/Urinary Tract: Adrenal glands are unremarkable. No suspicious renal mass. No hydronephrosis. No renal or ureteric calculi. Urinary bladder is under distended, precluding optimal assessment. However, no large mass or stones identified. No perivesical fat stranding. Stomach/Bowel: There is an approximately 8 cm long irregular heterogeneous markedly thick mass centered in the medial wall of the cecum/proximal ascending colon with extension outside the serosa, compatible with colonic malignancy. The terminal ileum is also likely involved in the tumor. The appendix is unremarkable. There are few adjacent heterogeneous subcentimeter sized lymph node, favored to represent locoregional metastases. There are hyperattenuating areas in the lumen, which may represent blood products. However, no active extravasation of contrast noted. No disproportionate dilation of the small or large bowel loops. There are multiple diverticula throughout the colon without diverticulitis. There is also a diverticulum arising from 3/4 part of duodenum. Vascular/Lymphatic: Small amount of ascites noted in the dependent pelvis. No pneumoperitoneum. No abdominal or pelvic lymphadenopathy, by size criteria. No aneurysmal dilation of the major abdominal arteries. There are moderate  peripheral atherosclerotic vascular calcifications of the aorta and its major branches. Reproductive: The uterus is unremarkable. No large adnexal mass. Other: There is a tiny fat containing umbilical hernia. The soft tissues and abdominal wall are otherwise unremarkable. Musculoskeletal: No suspicious osseous lesions. There are mild - moderate multilevel degenerative changes in the visualized spine. There is mild-to-moderate compression deformity of L1 vertebra and moderate compression deformity of T11 vertebrae, similar to prior study. IMPRESSION: 1. No active extravasation of contrast noted to suggest active bleeding. 2. There is an irregular mass centered in the cecum/ascending colon, as described above, compatible with colonic malignancy. There are hyperattenuating areas in the lumen, which may represent blood products. 3. No distant metastatic disease identified within the abdomen or pelvis. 4. Findings favor congestive heart failure/pulmonary edema, as described above. 5. Multiple other nonacute observations, as described above. Aortic Atherosclerosis (ICD10-I70.0). Electronically Signed   By: Jules Schick M.D.   On: 09/18/2023 14:12   Assessment/Plan DARIELLE HANCHER is a 89 y.o. female with medical history significant for breast cancer, GERD, hypertension, permanent atrial fibrillation on Eliquis being admitted to the hospital with blood loss anemia and concern for lower GI bleed.    Acute blood loss anemia-concerning for lower GI bleed given her hematochezia.  She is hemodynamically stable.  Hemoglobin is also stable compared to labs done on 2/10. -Inpatient admission -Telemetry monitoring -Clear liquid diet, n.p.o. after midnight -Monitor hemoglobin every 8 hours -Transfuse 1 unit PRBC now, patient and her daughter expressed their consent  Lower GI bleed-CTA bleeding study without evidence of active bleed, but shows new colonic mass -Continue to hold anticoagulation, last dose of Eliquis  was 2/13 AM -Would plan for stat CT angiogram bleeding study and IR consultation in case of severe bleed overnight  GERD-famotidine p.o.  Hypertension-continue home Cozaar, Lopressor  Colonic mass-anticipate she may benefit from colonoscopy and biopsy  Atrial fibrillation-rate is currently controlled, continue Lopressor, holding anticoagulation as above  History of breast cancer-status post lumpectomy and radiation, continue anastrozole  Depression-Paxil  Hyperlipidemia-Pravachol  DVT prophylaxis: SCDs only    Code Status: Limited: Do not attempt resuscitation (DNR) -DNR-LIMITED -Do Not Intubate/DNI   Consults called: [facility] notified, they plan to see her in  the morning  Admission status: The appropriate patient status for this patient is INPATIENT. Inpatient status is judged to be reasonable and necessary in order to provide the required intensity of service to ensure the patient's safety. The patient's presenting symptoms, physical exam findings, and initial radiographic and laboratory data in the context of their chronic comorbidities is felt to place them at high risk for further clinical deterioration. Furthermore, it is not anticipated that the patient will be medically stable for discharge from the hospital within 2 midnights of admission.    I certify that at the point of admission it is my clinical judgment that the patient will require inpatient hospital care spanning beyond 2 midnights from the point of admission due to high intensity of service, high risk for further deterioration and high frequency of surveillance required  Time spent: 59 minutes  Damareon Lanni Sharlette Dense MD [facility] Pager [phone]  If 7PM-7AM, please contact night-coverage [email or web address] Password [facility]  09/18/2023, 6:11 PM

## 2023-09-18 NOTE — ED Notes (Signed)
Report given to next RN.

## 2023-09-18 NOTE — ED Notes (Signed)
Pt x1 assisted to bathroom.

## 2023-09-18 NOTE — ED Notes (Signed)
Report given to [facility].

## 2023-09-18 NOTE — ED Provider Notes (Signed)
 [facility] AT [facility] Provider Note   CSN: [id number] Arrival date & time: 09/18/23  1025     History  Chief Complaint  Patient presents with   Abdominal Pain   Rectal Bleeding    Mallory Stephens is a 89 y.o. female.  Patient past medical history of hypertension, congestive heart failure (admit 1/9-1/13/25), atrial fibrillation (eliquis), iron deficiency anemia and prior history of breast cancer -- presents to the ED today for evaluation of rectal bleeding and low hgb. She had labs on 2/10 as part of heme/onc work-up. Her hgb dropped to 7.2 from 8.5 (08/15/23). Over the past day, she has had epigastric discomfort. She passed dark red stool last night. She has had nausea without vomiting. No urinary sx. She has had dark stool with iron supplement, but this is different. Last took Eliquis yesterday morning.        Home Medications Prior to Admission medications   Medication Sig Start Date End Date Taking? Authorizing Provider  acetaminophen (TYLENOL) 325 MG tablet Take 325 mg by mouth every 6 (six) hours as needed for moderate pain.    [provider]  anastrozole (ARIMIDEX) 1 MG tablet TAKE 1 TABLET DAILY 08/28/23   Serena Croissant, MD  cholecalciferol (VITAMIN D3) 25 MCG (1000 UNIT) tablet Take 1 tablet (1,000 Units total) by mouth daily. 11/01/20   Magrinat, Valentino Hue, MD  ELIQUIS 5 MG TABS tablet TAKE 1 TABLET TWICE A DAY 08/10/23   Quintella Reichert, MD  famotidine (PEPCID) 40 MG tablet Take 40 mg by mouth at bedtime. 12/29/18   [provider]  furosemide (LASIX) 40 MG tablet Take 1 tablet (40 mg total) by mouth daily. 08/17/23   Arrien, York Ram, MD  ibandronate (BONIVA) 150 MG tablet TAKE 1 TABLET BY MOUTH EVERY 30 DAYS ON THE 10TH DAY OF EACH MONTH IN THE MORNING WITH A FULL GLASS OF WATER,ON AN EMPTY STOMACH AND DO NOT TAKE ANYTHING ELSE BY MOUTH OR LIE DOWN FOR THE NEXT 30 MIN 07/27/23   Serena Croissant, MD  losartan (COZAAR) 25  MG tablet Take 1 tablet (25 mg total) by mouth daily. 10/29/22 10/29/23  Meredeth Ide, MD  metoprolol tartrate (LOPRESSOR) 25 MG tablet TAKE 1 TABLET TWICE A DAY 10/30/22   Quintella Reichert, MD  PARoxetine (PAXIL) 10 MG tablet Take 10 mg by mouth every morning.    [provider]  polyvinyl alcohol (LIQUIFILM TEARS) 1.4 % ophthalmic solution Place 1-2 drops into both eyes every 6 (six) hours as needed for dry eyes.    [provider]  pravastatin (PRAVACHOL) 20 MG tablet Take 20 mg by mouth at bedtime.    [provider]  solifenacin (VESICARE) 5 MG tablet Take 5 mg by mouth daily. 10/08/22   [provider]      Allergies    Sulfa antibiotics and Sulfa drugs cross reactors    Review of Systems   Review of Systems  Physical Exam Updated Vital Signs BP (!) 158/66 (BP Location: Left Arm)   Pulse 83   Temp 97.8 F (36.6 C)   Resp (!) 21   Ht 5\' 5"  (1.651 m)   Wt 56.7 kg   SpO2 100%   BMI 20.80 kg/m   Physical Exam Vitals and nursing note reviewed. Exam conducted with a chaperone present.  Constitutional:      General: She is not in acute distress.    Appearance: She is well-developed.  HENT:  Head: Normocephalic and atraumatic.     Right Ear: External ear normal.     Left Ear: External ear normal.     Nose: Nose normal.  Eyes:     Conjunctiva/sclera: Conjunctivae normal.  Cardiovascular:     Rate and Rhythm: Normal rate and regular rhythm.     Heart sounds: No murmur heard. Pulmonary:     Effort: No respiratory distress.     Breath sounds: No wheezing, rhonchi or rales.  Abdominal:     Palpations: Abdomen is soft.     Tenderness: There is generalized abdominal tenderness (mild). There is no guarding or rebound. Negative signs include Murphy's sign, Rovsing's sign and McBurney's sign.  Genitourinary:    Rectum: Guaiac result positive. No mass or external hemorrhoid.     Comments: Maroon stool noted on finger Musculoskeletal:      Cervical back: Normal range of motion and neck supple.     Right lower leg: No edema.     Left lower leg: No edema.  Skin:    General: Skin is warm and dry.     Findings: No rash.  Neurological:     General: No focal deficit present.     Mental Status: She is alert. Mental status is at baseline.     Motor: No weakness.  Psychiatric:        Mood and Affect: Mood normal.     ED Results / Procedures / Treatments   Labs (all labs ordered are listed, but only abnormal results are displayed) Labs Reviewed  COMPREHENSIVE METABOLIC PANEL - Abnormal; Notable for the following components:      Result Value   Total Protein 5.8 (*)    Albumin 3.3 (*)    All other components within normal limits  CBC - Abnormal; Notable for the following components:   RBC 2.73 (*)    Hemoglobin 7.3 (*)    HCT 25.0 (*)    MCHC 29.2 (*)    RDW 18.3 (*)    All other components within normal limits  OCCULT BLOOD X 1 CARD TO LAB, STOOL - Abnormal; Notable for the following components:   Fecal Occult Bld POSITIVE (*)    All other components within normal limits  LIPASE, BLOOD  URINALYSIS, ROUTINE W REFLEX MICROSCOPIC  TROPONIN I (HIGH SENSITIVITY)    ED ECG REPORT   Date: 09/18/2023  Rate: 90  Rhythm: atrial fibrillation  QRS Axis: normal  Intervals: normal  ST/T Wave abnormalities: nonspecific ST/T changes  Conduction Disutrbances:none  Narrative Interpretation:   Old EKG Reviewed: unchanged  I have personally reviewed the EKG tracing and agree with the computerized printout as noted.     Radiology CT ANGIO GI BLEED Result Date: 09/18/2023 CLINICAL DATA:  Lower GI bleed (Ped 0-17y) lower GI bleeding, mild abd discomfort. Lower abdominal pain. Bloody stools. Low hemoglobin. EXAM: CTA ABDOMEN AND PELVIS WITHOUT AND WITH CONTRAST TECHNIQUE: Multidetector CT imaging of the abdomen and pelvis was performed using the standard protocol during bolus administration of intravenous contrast. Multiplanar  reconstructed images and MIPs were obtained and reviewed to evaluate the vascular anatomy. RADIATION DOSE REDUCTION: This exam was performed according to the departmental dose-optimization program which includes automated exposure control, adjustment of the mA and/or kV according to patient size and/or use of iterative reconstruction technique. CONTRAST:  80mL OMNIPAQUE IOHEXOL 350 MG/ML SOLN COMPARISON:  CT scan abdomen and pelvis from 04/06/2022. FINDINGS: VASCULAR Aorta: Normal caliber aorta without aneurysm, dissection, vasculitis or significant stenosis. Celiac: Patent  without evidence of aneurysm, dissection, vasculitis or significant stenosis. There is mild narrowing at the origin to calcified plaques. SMA: Patent without evidence of aneurysm, dissection, vasculitis or significant stenosis. There is mild-to-moderate narrowing at the origin due to calcified plaques. Renals: Both renal arteries are patent without evidence of aneurysm, dissection, vasculitis, fibromuscular dysplasia or significant stenosis. IMA: Patent without evidence of aneurysm, dissection, vasculitis or significant stenosis. Inflow: Patent without evidence of aneurysm, dissection, vasculitis or significant stenosis. Proximal Outflow: Bilateral common femoral and visualized portions of the superficial and profunda femoral arteries are patent without evidence of aneurysm, dissection, vasculitis or significant stenosis. Veins: No obvious venous abnormality within the limitations of this arterial phase study. Review of the MIP images confirms the above findings. NON-VASCULAR Lower chest: Redemonstration of peripheral subpleural reticulations in the visualized bilateral lung bases. However, there is superimposed smooth interlobular septal thickening and bilateral trace pleural effusions, concerning for congestive heart failure/pulmonary edema. There is a 1 cm sized calcified granuloma in the right lung lower lobe. The lung bases are otherwise  clear. No pleural effusion. The heart is normal in size. No pericardial effusion. Hepatobiliary: The liver is normal in size. Non-cirrhotic configuration. No suspicious mass. No intrahepatic bile duct dilation. There is mild prominence of the extrahepatic bile duct, most likely due to post cholecystectomy status. Gallbladder is surgically absent. Pancreas: Unremarkable. No pancreatic ductal dilatation or surrounding inflammatory changes. Spleen: Within normal limits. No focal lesion. Adrenals/Urinary Tract: Adrenal glands are unremarkable. No suspicious renal mass. No hydronephrosis. No renal or ureteric calculi. Urinary bladder is under distended, precluding optimal assessment. However, no large mass or stones identified. No perivesical fat stranding. Stomach/Bowel: There is an approximately 8 cm long irregular heterogeneous markedly thick mass centered in the medial wall of the cecum/proximal ascending colon with extension outside the serosa, compatible with colonic malignancy. The terminal ileum is also likely involved in the tumor. The appendix is unremarkable. There are few adjacent heterogeneous subcentimeter sized lymph node, favored to represent locoregional metastases. There are hyperattenuating areas in the lumen, which may represent blood products. However, no active extravasation of contrast noted. No disproportionate dilation of the small or large bowel loops. There are multiple diverticula throughout the colon without diverticulitis. There is also a diverticulum arising from 3/4 part of duodenum. Vascular/Lymphatic: Small amount of ascites noted in the dependent pelvis. No pneumoperitoneum. No abdominal or pelvic lymphadenopathy, by size criteria. No aneurysmal dilation of the major abdominal arteries. There are moderate peripheral atherosclerotic vascular calcifications of the aorta and its major branches. Reproductive: The uterus is unremarkable. No large adnexal mass. Other: There is a tiny fat  containing umbilical hernia. The soft tissues and abdominal wall are otherwise unremarkable. Musculoskeletal: No suspicious osseous lesions. There are mild - moderate multilevel degenerative changes in the visualized spine. There is mild-to-moderate compression deformity of L1 vertebra and moderate compression deformity of T11 vertebrae, similar to prior study. IMPRESSION: 1. No active extravasation of contrast noted to suggest active bleeding. 2. There is an irregular mass centered in the cecum/ascending colon, as described above, compatible with colonic malignancy. There are hyperattenuating areas in the lumen, which may represent blood products. 3. No distant metastatic disease identified within the abdomen or pelvis. 4. Findings favor congestive heart failure/pulmonary edema, as described above. 5. Multiple other nonacute observations, as described above. Aortic Atherosclerosis (ICD10-I70.0). Electronically Signed   By: Jules Schick M.D.   On: 09/18/2023 14:12    Procedures Procedures    Medications Ordered in ED Medications  iohexol (OMNIPAQUE) 350 MG/ML injection 100 mL (80 mLs Intravenous Contrast Given 09/18/23 1203)    ED Course/ Medical Decision Making/ A&P    Patient seen and examined. History obtained directly from patient and family at bedside. Reviewed recent hospitalization notes.   Labs/EKG: Ordered CBC, CMP, troponin, hemoccult.  Imaging: Ordered CT abd/pelvis.  Medications/Fluids: None   Most recent vital signs reviewed and are as follows: BP (!) 158/66 (BP Location: Left Arm)   Pulse 83   Temp 97.8 F (36.6 C)   Resp (!) 21   Ht 5\' 5"  (1.651 m)   Wt 56.7 kg   SpO2 100%   BMI 20.80 kg/m   Initial impression: Worsening anemia, GI bleed on Eliquis.   2:32 PM Reassessment performed. Patient appears stable and comfortable.  Labs personally reviewed and interpreted including: CBC with hemoglobin at 7.3, normal white blood cell count; CMP slightly low protein and  albumin otherwise unremarkable; lipase normal; FOBT positive; troponin normal at 11.  Imaging personally visualized and interpreted including: CT abdomen pelvis, agree cecal mass, diverticulosis  Reviewed pertinent lab work and imaging with patient and daughter at bedside. Questions answered.   Most current vital signs reviewed and are as follows: BP (!) 148/70 (BP Location: Left Arm)   Pulse 86   Temp 97.8 F (36.6 C)   Resp (!) 22   Ht 5\' 5"  (1.651 m)   Wt 56.7 kg   SpO2 96%   BMI 20.80 kg/m   Plan: Admit to hospital.   I did send a secure chat to patient's oncologist, Dr. Pamelia Hoit, informing him of the planned admission.   2:49 PM Spoke with Dr. Elvera Lennox. Accepts for admission. Requests GI consult tomorrow.   3:19 PM Discussed case with Dr. Bosie Clos. GI will see in AM.                                 Medical Decision Making Amount and/or Complexity of Data Reviewed Labs: ordered. Radiology: ordered.  Risk Prescription drug management. Decision regarding hospitalization.   Patient with lower GI bleeding on Eliquis likely related to new diagnosis of colonic mass.  Hemoglobin is stable over the past couple of days.  She does have heme positive stool.  She is off anticoagulation for 24 hours.  Currently stable.  Otherwise stable, rate controlled A-fib.        Final Clinical Impression(s) / ED Diagnoses Final diagnoses:  Lower GI bleed  Colonic mass  Anemia, unspecified type    Rx / DC Orders ED Discharge Orders     None         Renne Crigler, PA-C 09/18/23 1519    Pricilla Loveless, MD 09/24/23 [phone]

## 2023-09-18 NOTE — ED Notes (Addendum)
Pt aware of the need for a urine... Unable to currently provide the sample.Marland KitchenMarland Kitchen

## 2023-09-19 DIAGNOSIS — K922 Gastrointestinal hemorrhage, unspecified: Secondary | ICD-10-CM | POA: Diagnosis not present

## 2023-09-19 LAB — BASIC METABOLIC PANEL
Anion gap: 8 (ref 5–15)
BUN: 15 mg/dL (ref 8–23)
CO2: 26 mmol/L (ref 22–32)
Calcium: 9 mg/dL (ref 8.9–10.3)
Chloride: 106 mmol/L (ref 98–111)
Creatinine, Ser: 0.76 mg/dL (ref 0.44–1.00)
GFR, Estimated: >60 mL/min (ref >60)
Glucose, Bld: 102 mg/dL — ABNORMAL HIGH (ref 70–99)
Potassium: 4 mmol/L (ref 3.5–5.1)
Sodium: 140 mmol/L (ref 135–145)

## 2023-09-19 LAB — HEMOGLOBIN AND HEMATOCRIT, BLOOD
HCT: 27.6 % — ABNORMAL LOW (ref 36.0–46.0)
HCT: 28.2 % — ABNORMAL LOW (ref 36.0–46.0)
Hemoglobin: 8.2 g/dL — ABNORMAL LOW (ref 12.0–15.0)
Hemoglobin: 8.1 g/dL — ABNORMAL LOW (ref 12.0–15.0)

## 2023-09-19 MED ORDER — POLYETHYLENE GLYCOL 3350 17 GM/SCOOP PO POWD
1.0000 | Freq: Once | ORAL | Status: AC
  Administered 2023-09-19: 255 g via ORAL
  Filled 2023-09-19: qty 255

## 2023-09-19 MED ORDER — FUROSEMIDE 40 MG PO TABS
40.0000 mg | ORAL_TABLET | Freq: Every day | ORAL | Status: DC
  Administered 2023-09-19 – 2023-09-26 (×8): 40 mg via ORAL
  Filled 2023-09-19 (×8): qty 1

## 2023-09-19 NOTE — Plan of Care (Signed)

## 2023-09-19 NOTE — Consult Note (Addendum)
 [facility] Gastroenterology Consult  Referring Provider: No ref. provider found Primary Care Physician:  Mila Palmer, MD Primary Gastroenterologist: Gentry Fitz  Reason for Consultation: GI bleed, abnormal CT imaging  SUBJECTIVE:   HPI: Mallory Stephens is a 89 y.o. female with past medical history significant for chronic kidney disease, pulmonary hypertension, atrial fibrillation on Eliquis, breast cancer diagnosed in 2021 (follows with [facility]).  Presented to hospital on 09/18/2023 with chief complaint of epigastric pain and blood in stool x 1 day.  Accompanied at bedside by her niece, Vernona Rieger.  Noted that she began to have epigastric discomfort and maroon-colored blood per rectum on Thursday evening.  This is never occurred before.  She has lost roughly 10 pounds over the past 6 months.  No chest pain or shortness of breath.  Last colonoscopy was roughly 10 years ago in [place], no report available for my review, she noted unremarkable findings.  No family history colon cancer.  No prior EGD.  CT angiogram for GI bleed completed on 09/18/2023 showed an 8 cm irregular mass in the medial wall of the cecum/proximal ascending colon with extension outside the serosa involving adjacent lymph nodes, status post cholecystectomy.  Hemoglobin 7.3 (baseline 8.5 on 08/15/2023), WBC) 4, platelet 369, iron studies appropriate.  Past Medical History:  Diagnosis Date   Breast cancer (HCC) 07/24/2020   Dyslipidemia    Dysrhythmia    atrial fibrillation   Edema extremities 09/03/2016   Family history of breast cancer    Family history of prostate cancer    Fatty liver    GERD (gastroesophageal reflux disease)    History of basal cell carcinoma    Hypertension    Lymphocytosis    followed by Dr. Arbutus Ped   Multinodular goiter    Osteoarthritis    Permanent atrial fibrillation (HCC)    atrial fibrillation s/p DCCV 3/10 and 6/10   PONV (postoperative nausea and vomiting)    PUD (peptic ulcer  disease)    Pulmonary nodules    followed  by Dr. Paulino Rily   UTI (urinary tract infection) 04/2017   Past Surgical History:  Procedure Laterality Date   aspiration of cyst  1978   BREAST LUMPECTOMY Left 09/2020   BREAST LUMPECTOMY WITH RADIOACTIVE SEED LOCALIZATION Left 09/06/2020   Procedure: LEFT BREAST LUMPECTOMY WITH RADIOACTIVE SEED LOCALIZATION;  Surgeon: Almond Lint, MD;  Location: [facility] OR;  Service: General;  Laterality: Left;   BREAST SURGERY  1962   right breast for benign lesion   BREAST SURGERY  1971   bilateral lumpectomy for beingn lesions   CARDIOVERSION  07/22/2011   Procedure: CARDIOVERSION;  Surgeon: Quintella Reichert, MD;  Location: [facility] OR;  Service: Cardiovascular;  Laterality: N/A;   CARDIOVERSION  09/02/2011   Procedure: CARDIOVERSION;  Surgeon: Quintella Reichert, MD;  Location: [facility] OR;  Service: Cardiovascular;  Laterality: N/A;   cataract     cataract removal-bilateral   CHOLECYSTECTOMY  2003   CYSTOSCOPY     DILATION AND CURETTAGE OF UTERUS  1979   JOINT REPLACEMENT  2002   Right knee   JOINT REPLACEMENT  2007   left knee   Prior to Admission medications   Medication Sig Start Date End Date Taking? Authorizing Provider  acetaminophen (TYLENOL) 325 MG tablet Take 325 mg by mouth every 6 (six) hours as needed for moderate pain.   Yes [provider]  anastrozole (ARIMIDEX) 1 MG tablet TAKE 1 TABLET DAILY Patient taking differently: Take 1 mg by mouth in  the morning. 08/28/23  Yes Serena Croissant, MD  cholecalciferol (VITAMIN D3) 25 MCG (1000 UNIT) tablet Take 1 tablet (1,000 Units total) by mouth daily. 11/01/20  Yes Magrinat, Valentino Hue, MD  ELIQUIS 5 MG TABS tablet TAKE 1 TABLET TWICE A DAY 08/10/23  Yes Turner, Cornelious Bryant, MD  famotidine (PEPCID) 40 MG tablet Take 40 mg by mouth at bedtime. 12/29/18  Yes [provider]  furosemide (LASIX) 20 MG tablet Take 20 mg by mouth in the morning.   Yes [provider]  ibandronate (BONIVA) 150 MG tablet  TAKE 1 TABLET BY MOUTH EVERY 30 DAYS ON THE 10TH DAY OF EACH MONTH IN THE MORNING WITH A FULL GLASS OF WATER,ON AN EMPTY STOMACH AND DO NOT TAKE ANYTHING ELSE BY MOUTH OR LIE DOWN FOR THE NEXT 30 MIN Patient taking differently: Take 150 mg by mouth every 30 (thirty) days. 07/27/23  Yes Serena Croissant, MD  losartan (COZAAR) 25 MG tablet Take 1 tablet (25 mg total) by mouth daily. 10/29/22 10/29/23 Yes Meredeth Ide, MD  metoprolol tartrate (LOPRESSOR) 25 MG tablet TAKE 1 TABLET TWICE A DAY 10/30/22  Yes Turner, Traci R, MD  PARoxetine (PAXIL) 10 MG tablet Take 10 mg by mouth every morning.   Yes [provider]  polyvinyl alcohol (LIQUIFILM TEARS) 1.4 % ophthalmic solution Place 1-2 drops into both eyes every 6 (six) hours as needed for dry eyes.   Yes [provider]  pravastatin (PRAVACHOL) 20 MG tablet Take 20 mg by mouth at bedtime.   Yes [provider]  solifenacin (VESICARE) 5 MG tablet Take 5 mg by mouth in the morning.   Yes [provider]  furosemide (LASIX) 40 MG tablet Take 1 tablet (40 mg total) by mouth daily. Patient not taking: Reported on 09/18/2023 08/17/23   Arrien, York Ram, MD   Current Facility-Administered Medications  Medication Dose Route Frequency Provider Last Rate Last Admin   acetaminophen (TYLENOL) tablet 650 mg  650 mg Oral Q6H PRN Kirby Crigler, Mir M, MD       Or   acetaminophen (TYLENOL) suppository 650 mg  650 mg Rectal Q6H PRN Kirby Crigler, Mir M, MD       albuterol (PROVENTIL) (2.5 MG/3ML) 0.083% nebulizer solution 2.5 mg  2.5 mg Nebulization Q2H PRN Kirby Crigler, Mir M, MD       anastrozole (ARIMIDEX) tablet 1 mg  1 mg Oral q AM Kirby Crigler, Mir M, MD       famotidine (PEPCID) tablet 40 mg  40 mg Oral QHS Kirby Crigler, Mir M, MD   40 mg at 09/18/23 2112   fesoterodine (TOVIAZ) tablet 4 mg  4 mg Oral Daily Kirby Crigler, Mir M, MD       furosemide (LASIX) tablet 40 mg  40 mg Oral Daily Gherghe, Costin M, MD       losartan (COZAAR)  tablet 25 mg  25 mg Oral Daily Kirby Crigler, Mir M, MD   25 mg at 09/18/23 2112   metoprolol tartrate (LOPRESSOR) tablet 25 mg  25 mg Oral BID Kirby Crigler, Mir M, MD   25 mg at 09/18/23 2112   ondansetron (ZOFRAN) tablet 4 mg  4 mg Oral Q6H PRN Kirby Crigler, Mir M, MD       Or   ondansetron [facility]) injection 4 mg  4 mg Intravenous Q6H PRN Kirby Crigler, Mir M, MD       PARoxetine (PAXIL) tablet 10 mg  10 mg Oral Lamar Laundry, Mir M, MD  pravastatin (PRAVACHOL) tablet 20 mg  20 mg Oral QHS Kirby Crigler, Mir M, MD   20 mg at 09/18/23 2112   Facility-Administered Medications Ordered in Other Encounters  Medication Dose Route Frequency Provider Last Rate Last Admin   0.9 %  sodium chloride infusion   Intravenous Continuous Rachel Moulds, MD 10 mL/hr at 08/12/23 1427 New Bag at 08/12/23 1427   Allergies as of 09/18/2023 - Review Complete 09/18/2023  Allergen Reaction Noted   Sulfa antibiotics Rash 10/25/2022   Sulfa drugs cross reactors Rash 07/16/2011   Family History  Problem Relation Age of Onset   Stroke Mother    Stroke Father    Breast cancer Sister        dx 67s   Arthritis Sister    Scoliosis Sister    Heart disease Brother    Prostate cancer Brother        dx >50, metastatic   Prostate cancer Brother        dx >50, metastatic   Prostate cancer Nephew        dx <50   Breast cancer Niece        dx <50   Breast cancer Niece        dx <50   Autoimmune disease Neg Hx    Social History   Socioeconomic History   Marital status: Widowed    Spouse name: Not on file   Number of children: Not on file   Years of education: Not on file   Highest education level: Not on file  Occupational History   Not on file  Tobacco Use   Smoking status: Former    Current packs/day: 1.00    Average packs/day: 1 pack/day for 30.0 years (30.0 ttl pk-yrs)    Types: Cigarettes   Smokeless tobacco: Never  Vaping Use   Vaping status: Never Used  Substance and Sexual Activity   Alcohol  use: Yes    Comment: occasional wine   Drug use: No   Sexual activity: Not on file  Other Topics Concern   Not on file  Social History Narrative   ** Merged History Encounter **       Social Drivers of Health   Financial Resource Strain: Not on file  Food Insecurity: No Food Insecurity (09/18/2023)   Hunger Vital Sign    Worried About Running Out of Food in the Last Year: Never true    Ran Out of Food in the Last Year: Never true  Transportation Needs: No Transportation Needs (09/18/2023)   PRAPARE - Administrator, Civil Service (Medical): No    Lack of Transportation (Non-Medical): No  Physical Activity: Not on file  Stress: Not on file  Social Connections: Socially Isolated (09/18/2023)   Social Connection and Isolation Panel [NHANES]    Frequency of Communication with Friends and Family: More than three times a week    Frequency of Social Gatherings with Friends and Family: More than three times a week    Attends Religious Services: Never    Database administrator or Organizations: No    Attends Banker Meetings: Never    Marital Status: Widowed  Intimate Partner Violence: Not At Risk (09/18/2023)   Humiliation, Afraid, Rape, and Kick questionnaire    Fear of Current or Ex-Partner: No    Emotionally Abused: No    Physically Abused: No    Sexually Abused: No   Review of Systems:  Review of Systems  Constitutional:  Positive for weight loss. Negative for malaise/fatigue.  Respiratory:  Negative for shortness of breath.   Cardiovascular:  Negative for chest pain.  Gastrointestinal:  Positive for abdominal pain, blood in stool, nausea and vomiting.    OBJECTIVE:   Temp:  [98.1 F (36.7 C)-98.9 F (37.2 C)] 98.3 F (36.8 C) (02/15 0500) Pulse Rate:  [81-97] 87 (02/15 0500) Resp:  [15-22] 16 (02/15 0500) BP: (140-170)/(51-101) 157/97 (02/15 0500) SpO2:  [93 %-98 %] 93 % (02/15 0500) Last BM Date : 09/17/23 Physical Exam Constitutional:       General: She is not in acute distress.    Appearance: She is not ill-appearing, toxic-appearing or diaphoretic.  Cardiovascular:     Rate and Rhythm: Rhythm irregular.  Pulmonary:     Effort: No respiratory distress.     Breath sounds: Normal breath sounds.  Abdominal:     General: Bowel sounds are normal. There is no distension.     Palpations: Abdomen is soft.     Tenderness: There is no abdominal tenderness. There is no guarding.  Musculoskeletal:     Right lower leg: No edema.     Left lower leg: No edema.  Skin:    General: Skin is warm and dry.  Neurological:     Mental Status: She is alert.     Labs: Recent Labs    09/18/23 1039 09/18/23 1832 09/19/23 0154  WBC 9.4  --   --   HGB 7.3* 7.1* 8.2*  HCT 25.0* 26.4* 27.6*  PLT 369  --   --    BMET Recent Labs    09/18/23 1039 09/19/23 0154  NA 141 140  K 3.9 4.0  CL 104 106  CO2 29 26  GLUCOSE 97 102*  BUN 15 15  CREATININE 0.81 0.76  CALCIUM 9.5 9.0   LFT Recent Labs    09/18/23 1039  PROT 5.8*  ALBUMIN 3.3*  AST 19  ALT 5  ALKPHOS 46  BILITOT 0.7   PT/INR No results for input(s): "LABPROT", "INR" in the last 72 hours.  Diagnostic imaging: CT ANGIO GI BLEED Result Date: 09/18/2023 CLINICAL DATA:  Lower GI bleed (Ped 0-17y) lower GI bleeding, mild abd discomfort. Lower abdominal pain. Bloody stools. Low hemoglobin. EXAM: CTA ABDOMEN AND PELVIS WITHOUT AND WITH CONTRAST TECHNIQUE: Multidetector CT imaging of the abdomen and pelvis was performed using the standard protocol during bolus administration of intravenous contrast. Multiplanar reconstructed images and MIPs were obtained and reviewed to evaluate the vascular anatomy. RADIATION DOSE REDUCTION: This exam was performed according to the departmental dose-optimization program which includes automated exposure control, adjustment of the mA and/or kV according to patient size and/or use of iterative reconstruction technique. CONTRAST:  80mL OMNIPAQUE  IOHEXOL 350 MG/ML SOLN COMPARISON:  CT scan abdomen and pelvis from 04/06/2022. FINDINGS: VASCULAR Aorta: Normal caliber aorta without aneurysm, dissection, vasculitis or significant stenosis. Celiac: Patent without evidence of aneurysm, dissection, vasculitis or significant stenosis. There is mild narrowing at the origin to calcified plaques. SMA: Patent without evidence of aneurysm, dissection, vasculitis or significant stenosis. There is mild-to-moderate narrowing at the origin due to calcified plaques. Renals: Both renal arteries are patent without evidence of aneurysm, dissection, vasculitis, fibromuscular dysplasia or significant stenosis. IMA: Patent without evidence of aneurysm, dissection, vasculitis or significant stenosis. Inflow: Patent without evidence of aneurysm, dissection, vasculitis or significant stenosis. Proximal Outflow: Bilateral common femoral and visualized portions of the superficial and profunda femoral arteries are patent without evidence of aneurysm, dissection, vasculitis  or significant stenosis. Veins: No obvious venous abnormality within the limitations of this arterial phase study. Review of the MIP images confirms the above findings. NON-VASCULAR Lower chest: Redemonstration of peripheral subpleural reticulations in the visualized bilateral lung bases. However, there is superimposed smooth interlobular septal thickening and bilateral trace pleural effusions, concerning for congestive heart failure/pulmonary edema. There is a 1 cm sized calcified granuloma in the right lung lower lobe. The lung bases are otherwise clear. No pleural effusion. The heart is normal in size. No pericardial effusion. Hepatobiliary: The liver is normal in size. Non-cirrhotic configuration. No suspicious mass. No intrahepatic bile duct dilation. There is mild prominence of the extrahepatic bile duct, most likely due to post cholecystectomy status. Gallbladder is surgically absent. Pancreas: Unremarkable. No  pancreatic ductal dilatation or surrounding inflammatory changes. Spleen: Within normal limits. No focal lesion. Adrenals/Urinary Tract: Adrenal glands are unremarkable. No suspicious renal mass. No hydronephrosis. No renal or ureteric calculi. Urinary bladder is under distended, precluding optimal assessment. However, no large mass or stones identified. No perivesical fat stranding. Stomach/Bowel: There is an approximately 8 cm long irregular heterogeneous markedly thick mass centered in the medial wall of the cecum/proximal ascending colon with extension outside the serosa, compatible with colonic malignancy. The terminal ileum is also likely involved in the tumor. The appendix is unremarkable. There are few adjacent heterogeneous subcentimeter sized lymph node, favored to represent locoregional metastases. There are hyperattenuating areas in the lumen, which may represent blood products. However, no active extravasation of contrast noted. No disproportionate dilation of the small or large bowel loops. There are multiple diverticula throughout the colon without diverticulitis. There is also a diverticulum arising from 3/4 part of duodenum. Vascular/Lymphatic: Small amount of ascites noted in the dependent pelvis. No pneumoperitoneum. No abdominal or pelvic lymphadenopathy, by size criteria. No aneurysmal dilation of the major abdominal arteries. There are moderate peripheral atherosclerotic vascular calcifications of the aorta and its major branches. Reproductive: The uterus is unremarkable. No large adnexal mass. Other: There is a tiny fat containing umbilical hernia. The soft tissues and abdominal wall are otherwise unremarkable. Musculoskeletal: No suspicious osseous lesions. There are mild - moderate multilevel degenerative changes in the visualized spine. There is mild-to-moderate compression deformity of L1 vertebra and moderate compression deformity of T11 vertebrae, similar to prior study. IMPRESSION: 1.  No active extravasation of contrast noted to suggest active bleeding. 2. There is an irregular mass centered in the cecum/ascending colon, as described above, compatible with colonic malignancy. There are hyperattenuating areas in the lumen, which may represent blood products. 3. No distant metastatic disease identified within the abdomen or pelvis. 4. Findings favor congestive heart failure/pulmonary edema, as described above. 5. Multiple other nonacute observations, as described above. Aortic Atherosclerosis (ICD10-I70.0). Electronically Signed   By: Jules Schick M.D.   On: 09/18/2023 14:12   IMPRESSION: Hematochezia Acute blood loss anemia Abnormal CT imaging concerning for colonic mass Atrial fibrillation on Eliquis Chronic kidney disease Breast cancer diagnosed 2021 (follows with [facility]) Pulmonary hypertension  PLAN: -Discussed workup of colonic mass lesion with patient and her niece at bedside today, this includes colonoscopy, alternatives would be surgical evaluation -Family and patient would like to discuss surgical intervention before proceeding with colonoscopy -Discussed colonoscopy benefits, alternatives and risks of bleeding/infection/perforation/missed lesion/anesthesia, she verbalized understanding and elected to proceed if recommended -Trend H/H, transfuse for hemoglobin less than 7 -Continue to hold Eliquis -Await general surgery evaluation, discussed with IM  -Can complete colonoscopy on 09/20/2023 pending general surgery  evaluation -[facility] will follow   LOS: 1 day   Liliane Shi, [place] [facility]  UPDATE 11:58 AM -General Surgery consultation reviewed, plan for right hemicolectomy on 09/21/23 -No need for colonoscopy given surgical intervention -[facility] will sign off and be available as needed should questions arise  Liliane Shi, DO [facility]

## 2023-09-19 NOTE — Consult Note (Signed)
 Reason for Consult:  Bleeding cecal mass Referring Physician: Elvera Lennox PCP - Mila Palmer GI consultant - Dr. Virgel Bouquet Mallory Stephens is an 89 y.o. female.  HPI: This is an 89 year old female with chronic kidney disease, pulmonary hypertension, atrial fibrillation (on Eliquis), and breast cancer who presented with a couple of days of vague abdominal pain and hematochezia.  The patient underwent left lumpectomy by Dr. Donell Beers in 2022 and did well with general anesthesia.  She lives independently with her sister.  She no longer drives.    Over the last 6 months, she has lost about 10 lbs unintentionally.  She has had some migrating vague abdominal pain.  Last colonoscopy was over 10 years ago.  She began passing bloody stool on 2/13 and presented to the ED for evaluation.  CTA revealed an 8 cm cecal mass with extension through the serosa, involving some adjacent lymph nodes and possibly the terminal ileum.  The patient has had a previous cholecystectomy.    Her presenting Hgb was 7.3.  She has received one u prbc with a follow-up Hgb of 8.2.  She has had chronic anemia for the last couple of years.  She was hospitalized last month with CHF and community-acquired pneumonia.  Her last dose of Eliquis was 2/13.  Her niece Vernona Rieger is at the bedside.  Past Medical History:  Diagnosis Date   Breast cancer (HCC) 07/24/2020   Dyslipidemia    Dysrhythmia    atrial fibrillation   Edema extremities 09/03/2016   Family history of breast cancer    Family history of prostate cancer    Fatty liver    GERD (gastroesophageal reflux disease)    History of basal cell carcinoma    Hypertension    Lymphocytosis    followed by Dr. Arbutus Ped   Multinodular goiter    Osteoarthritis    Permanent atrial fibrillation (HCC)    atrial fibrillation s/p DCCV 3/10 and 6/10   PONV (postoperative nausea and vomiting)    PUD (peptic ulcer disease)    Pulmonary nodules    followed  by Dr. Paulino Rily   UTI (urinary tract  infection) 04/2017    Past Surgical History:  Procedure Laterality Date   aspiration of cyst  1978   BREAST LUMPECTOMY Left 09/2020   BREAST LUMPECTOMY WITH RADIOACTIVE SEED LOCALIZATION Left 09/06/2020   Procedure: LEFT BREAST LUMPECTOMY WITH RADIOACTIVE SEED LOCALIZATION;  Surgeon: Almond Lint, MD;  Location: [facility] OR;  Service: General;  Laterality: Left;   BREAST SURGERY  1962   right breast for benign lesion   BREAST SURGERY  1971   bilateral lumpectomy for beingn lesions   CARDIOVERSION  07/22/2011   Procedure: CARDIOVERSION;  Surgeon: Quintella Reichert, MD;  Location: [facility] OR;  Service: Cardiovascular;  Laterality: N/A;   CARDIOVERSION  09/02/2011   Procedure: CARDIOVERSION;  Surgeon: Quintella Reichert, MD;  Location: [facility] OR;  Service: Cardiovascular;  Laterality: N/A;   cataract     cataract removal-bilateral   CHOLECYSTECTOMY  2003   CYSTOSCOPY     DILATION AND CURETTAGE OF UTERUS  1979   JOINT REPLACEMENT  2002   Right knee   JOINT REPLACEMENT  2007   left knee    Family History  Problem Relation Age of Onset   Stroke Mother    Stroke Father    Breast cancer Sister        dx 85s   Arthritis Sister    Scoliosis Sister    Heart  disease Brother    Prostate cancer Brother        dx >50, metastatic   Prostate cancer Brother        dx >50, metastatic   Prostate cancer Nephew        dx <50   Breast cancer Niece        dx <50   Breast cancer Niece        dx <50   Autoimmune disease Neg Hx     Social History:  reports that she has quit smoking. Her smoking use included cigarettes. She has a 30 pack-year smoking history. She has never used smokeless tobacco. She reports current alcohol use. She reports that she does not use drugs.  Allergies:  Allergies  Allergen Reactions   Sulfa Antibiotics Rash   Sulfa Drugs Cross Reactors Rash    Medications:  Prior to Admission medications   Medication Sig Start Date End Date Taking? Authorizing Provider  acetaminophen  (TYLENOL) 325 MG tablet Take 325 mg by mouth every 6 (six) hours as needed for moderate pain.   Yes [provider]  anastrozole (ARIMIDEX) 1 MG tablet TAKE 1 TABLET DAILY Patient taking differently: Take 1 mg by mouth in the morning. 08/28/23  Yes Serena Croissant, MD  cholecalciferol (VITAMIN D3) 25 MCG (1000 UNIT) tablet Take 1 tablet (1,000 Units total) by mouth daily. 11/01/20  Yes Magrinat, Valentino Hue, MD  ELIQUIS 5 MG TABS tablet TAKE 1 TABLET TWICE A DAY 08/10/23  Yes Turner, Cornelious Bryant, MD  famotidine (PEPCID) 40 MG tablet Take 40 mg by mouth at bedtime. 12/29/18  Yes [provider]  furosemide (LASIX) 20 MG tablet Take 20 mg by mouth in the morning.   Yes [provider]  ibandronate (BONIVA) 150 MG tablet TAKE 1 TABLET BY MOUTH EVERY 30 DAYS ON THE 10TH DAY OF EACH MONTH IN THE MORNING WITH A FULL GLASS OF WATER,ON AN EMPTY STOMACH AND DO NOT TAKE ANYTHING ELSE BY MOUTH OR LIE DOWN FOR THE NEXT 30 MIN Patient taking differently: Take 150 mg by mouth every 30 (thirty) days. 07/27/23  Yes Serena Croissant, MD  losartan (COZAAR) 25 MG tablet Take 1 tablet (25 mg total) by mouth daily. 10/29/22 10/29/23 Yes Meredeth Ide, MD  metoprolol tartrate (LOPRESSOR) 25 MG tablet TAKE 1 TABLET TWICE A DAY 10/30/22  Yes Turner, Traci R, MD  PARoxetine (PAXIL) 10 MG tablet Take 10 mg by mouth every morning.   Yes [provider]  polyvinyl alcohol (LIQUIFILM TEARS) 1.4 % ophthalmic solution Place 1-2 drops into both eyes every 6 (six) hours as needed for dry eyes.   Yes [provider]  pravastatin (PRAVACHOL) 20 MG tablet Take 20 mg by mouth at bedtime.   Yes [provider]  solifenacin (VESICARE) 5 MG tablet Take 5 mg by mouth in the morning.   Yes [provider]  furosemide (LASIX) 40 MG tablet Take 1 tablet (40 mg total) by mouth daily. Patient not taking: Reported on 09/18/2023 08/17/23   Arrien, York Ram, MD     Results for orders placed or  performed during the hospital encounter of 09/18/23 (from the past 48 hours)  Lipase, blood     Status: None   Collection Time: 09/18/23 10:39 AM  Result Value Ref Range   Lipase 14 11 - 51 U/L    Comment: Performed at [facility], [address] [place], [place] [address]  Comprehensive metabolic panel  Status: Abnormal   Collection Time: 09/18/23 10:39 AM  Result Value Ref Range   Sodium 141 135 - 145 mmol/L   Potassium 3.9 3.5 - 5.1 mmol/L   Chloride 104 98 - 111 mmol/L   CO2 29 22 - 32 mmol/L   Glucose, Bld 97 70 - 99 mg/dL    Comment: Glucose reference range applies only to samples taken after fasting for at least 8 hours.   BUN 15 8 - 23 mg/dL   Creatinine, Ser 8.11 0.44 - 1.00 mg/dL   Calcium 9.5 8.9 - 91.4 mg/dL   Total Protein 5.8 (L) 6.5 - 8.1 g/dL   Albumin 3.3 (L) 3.5 - 5.0 g/dL   AST 19 15 - 41 U/L   ALT 5 0 - 44 U/L   Alkaline Phosphatase 46 38 - 126 U/L   Total Bilirubin 0.7 0.0 - 1.2 mg/dL   GFR, Estimated >78 >29 mL/min    Comment: (NOTE) Calculated using the CKD-EPI Creatinine Equation (2021)    Anion gap 8 5 - 15    Comment: Performed at [facility], [address] [place], [place] [address]  CBC     Status: Abnormal   Collection Time: 09/18/23 10:39 AM  Result Value Ref Range   WBC 9.4 4.0 - 10.5 K/uL   RBC 2.73 (L) 3.87 - 5.11 MIL/uL   Hemoglobin 7.3 (L) 12.0 - 15.0 g/dL   HCT 08.6 (L) 57.8 - 46.9 %   MCV 91.6 80.0 - 100.0 fL   MCH 26.7 26.0 - 34.0 pg   MCHC 29.2 (L) 30.0 - 36.0 g/dL   RDW 62.9 (H) 52.8 - 41.3 %   Platelets 369 150 - 400 K/uL   nRBC 0.0 0.0 - 0.2 %    Comment: Performed at [facility], [address] [place], [place] [address]  Occult blood card to lab, stool     Status: Abnormal   Collection Time: 09/18/23 10:47 AM  Result Value Ref Range   Fecal Occult Bld POSITIVE (A) NEGATIVE    Comment: Performed at [facility], [address]  [place], [place] [address]  Troponin I (High Sensitivity)     Status: None   Collection Time: 09/18/23 11:04 AM  Result Value Ref Range   Troponin I (High Sensitivity) 11 <18 ng/L    Comment: (NOTE) Elevated high sensitivity troponin I (hsTnI) values and significant  changes across serial measurements may suggest ACS but many other  chronic and acute conditions are known to elevate hsTnI results.  Refer to the "Links" section for chest pain algorithms and additional  guidance. Performed at [facility], [address] [place], [place] [address]   Urinalysis, Routine w reflex microscopic -Urine, Clean Catch     Status: Abnormal   Collection Time: 09/18/23  3:38 PM  Result Value Ref Range   Color, Urine YELLOW YELLOW   APPearance CLEAR CLEAR   Specific Gravity, Urine >1.046 (H) 1.005 - 1.030   pH 6.0 5.0 - 8.0   Glucose, UA NEGATIVE NEGATIVE mg/dL   Hgb urine dipstick NEGATIVE NEGATIVE   Bilirubin Urine NEGATIVE NEGATIVE   Ketones, ur NEGATIVE NEGATIVE mg/dL   Protein, ur 30 (A) NEGATIVE mg/dL   Nitrite NEGATIVE NEGATIVE   Leukocytes,Ua NEGATIVE NEGATIVE   RBC / HPF 0-5 0 - 5 RBC/hpf   WBC, UA 0-5 0 - 5 WBC/hpf   Bacteria, UA NONE SEEN NONE SEEN   Squamous Epithelial / HPF 0-5 0 - 5 /  HPF    Comment: Performed at [facility], [address] [place], [place] [address]  Hemoglobin and hematocrit, blood     Status: Abnormal   Collection Time: 09/18/23  6:32 PM  Result Value Ref Range   Hemoglobin 7.1 (L) 12.0 - 15.0 g/dL   HCT 19.1 (L) 47.8 - 29.5 %    Comment: Performed at [facility], [address] [place], [place] [address]  Type and screen [facility]     Status: None (Preliminary result)   Collection Time: 09/18/23  6:32 PM  Result Value Ref Range   ABO/RH(D) A NEG    Antibody Screen NEG    Sample Expiration 09/21/2023,2359    Unit Number [id number]    Blood Component Type RBC LR PHER2     Unit division 00    Status of Unit ISSUED    Transfusion Status OK TO TRANSFUSE    Crossmatch Result      Compatible Performed at [facility], [address] [place], [place] [address]   Prepare RBC (crossmatch)     Status: None   Collection Time: 09/18/23  6:32 PM  Result Value Ref Range   Order Confirmation      ORDER PROCESSED BY BLOOD BANK Performed at [facility], [address] [place], [place] [address]   ABO/Rh     Status: None   Collection Time: 09/18/23  7:56 PM  Result Value Ref Range   ABO/RH(D)      A NEG Performed at [facility], [address], [place], [place] [address]   Basic metabolic panel     Status: Abnormal   Collection Time: 09/19/23  1:54 AM  Result Value Ref Range   Sodium 140 135 - 145 mmol/L   Potassium 4.0 3.5 - 5.1 mmol/L   Chloride 106 98 - 111 mmol/L   CO2 26 22 - 32 mmol/L   Glucose, Bld 102 (H) 70 - 99 mg/dL    Comment: Glucose reference range applies only to samples taken after fasting for at least 8 hours.   BUN 15 8 - 23 mg/dL   Creatinine, Ser 6.64 0.44 - 1.00 mg/dL   Calcium 9.0 8.9 - 40.3 mg/dL   GFR, Estimated >47 >42 mL/min    Comment: (NOTE) Calculated using the CKD-EPI Creatinine Equation (2021)    Anion gap 8 5 - 15    Comment: Performed at [facility], [address] [place], [place] [address]  Hemoglobin and hematocrit, blood     Status: Abnormal   Collection Time: 09/19/23  1:54 AM  Result Value Ref Range   Hemoglobin 8.2 (L) 12.0 - 15.0 g/dL   HCT 87.5 (L) 64.3 - 32.9 %    Comment: Performed at [facility], [address] [place], [place] [address]    CT ANGIO GI BLEED Result Date: 09/18/2023 CLINICAL DATA:  Lower GI bleed (Ped 0-17y) lower GI bleeding, mild abd discomfort. Lower abdominal pain. Bloody stools. Low hemoglobin. EXAM: CTA ABDOMEN AND PELVIS WITHOUT AND WITH CONTRAST TECHNIQUE: Multidetector CT imaging of  the abdomen and pelvis was performed using the standard protocol during bolus administration of intravenous contrast. Multiplanar reconstructed images and MIPs were obtained and reviewed to evaluate the vascular anatomy. RADIATION DOSE REDUCTION: This exam was performed according to the departmental dose-optimization program which includes automated exposure control, adjustment of the mA and/or kV according to patient size and/or use of  iterative reconstruction technique. CONTRAST:  80mL OMNIPAQUE IOHEXOL 350 MG/ML SOLN COMPARISON:  CT scan abdomen and pelvis from 04/06/2022. FINDINGS: VASCULAR Aorta: Normal caliber aorta without aneurysm, dissection, vasculitis or significant stenosis. Celiac: Patent without evidence of aneurysm, dissection, vasculitis or significant stenosis. There is mild narrowing at the origin to calcified plaques. SMA: Patent without evidence of aneurysm, dissection, vasculitis or significant stenosis. There is mild-to-moderate narrowing at the origin due to calcified plaques. Renals: Both renal arteries are patent without evidence of aneurysm, dissection, vasculitis, fibromuscular dysplasia or significant stenosis. IMA: Patent without evidence of aneurysm, dissection, vasculitis or significant stenosis. Inflow: Patent without evidence of aneurysm, dissection, vasculitis or significant stenosis. Proximal Outflow: Bilateral common femoral and visualized portions of the superficial and profunda femoral arteries are patent without evidence of aneurysm, dissection, vasculitis or significant stenosis. Veins: No obvious venous abnormality within the limitations of this arterial phase study. Review of the MIP images confirms the above findings. NON-VASCULAR Lower chest: Redemonstration of peripheral subpleural reticulations in the visualized bilateral lung bases. However, there is superimposed smooth interlobular septal thickening and bilateral trace pleural effusions, concerning for congestive heart  failure/pulmonary edema. There is a 1 cm sized calcified granuloma in the right lung lower lobe. The lung bases are otherwise clear. No pleural effusion. The heart is normal in size. No pericardial effusion. Hepatobiliary: The liver is normal in size. Non-cirrhotic configuration. No suspicious mass. No intrahepatic bile duct dilation. There is mild prominence of the extrahepatic bile duct, most likely due to post cholecystectomy status. Gallbladder is surgically absent. Pancreas: Unremarkable. No pancreatic ductal dilatation or surrounding inflammatory changes. Spleen: Within normal limits. No focal lesion. Adrenals/Urinary Tract: Adrenal glands are unremarkable. No suspicious renal mass. No hydronephrosis. No renal or ureteric calculi. Urinary bladder is under distended, precluding optimal assessment. However, no large mass or stones identified. No perivesical fat stranding. Stomach/Bowel: There is an approximately 8 cm long irregular heterogeneous markedly thick mass centered in the medial wall of the cecum/proximal ascending colon with extension outside the serosa, compatible with colonic malignancy. The terminal ileum is also likely involved in the tumor. The appendix is unremarkable. There are few adjacent heterogeneous subcentimeter sized lymph node, favored to represent locoregional metastases. There are hyperattenuating areas in the lumen, which may represent blood products. However, no active extravasation of contrast noted. No disproportionate dilation of the small or large bowel loops. There are multiple diverticula throughout the colon without diverticulitis. There is also a diverticulum arising from 3/4 part of duodenum. Vascular/Lymphatic: Small amount of ascites noted in the dependent pelvis. No pneumoperitoneum. No abdominal or pelvic lymphadenopathy, by size criteria. No aneurysmal dilation of the major abdominal arteries. There are moderate peripheral atherosclerotic vascular calcifications of the  aorta and its major branches. Reproductive: The uterus is unremarkable. No large adnexal mass. Other: There is a tiny fat containing umbilical hernia. The soft tissues and abdominal wall are otherwise unremarkable. Musculoskeletal: No suspicious osseous lesions. There are mild - moderate multilevel degenerative changes in the visualized spine. There is mild-to-moderate compression deformity of L1 vertebra and moderate compression deformity of T11 vertebrae, similar to prior study. IMPRESSION: 1. No active extravasation of contrast noted to suggest active bleeding. 2. There is an irregular mass centered in the cecum/ascending colon, as described above, compatible with colonic malignancy. There are hyperattenuating areas in the lumen, which may represent blood products. 3. No distant metastatic disease identified within the abdomen or pelvis. 4. Findings favor congestive heart failure/pulmonary edema, as described above. 5. Multiple other  nonacute observations, as described above. Aortic Atherosclerosis (ICD10-I70.0). Electronically Signed   By: Jules Schick M.D.   On: 09/18/2023 14:12    Review of Systems  Constitutional:  Positive for weight loss. Negative for malaise/fatigue.  Respiratory:  Negative for shortness of breath.   Cardiovascular:  Negative for chest pain.  Gastrointestinal:  Positive for abdominal pain, blood in stool, nausea and vomiting.   Blood pressure (!) 157/97, pulse 87, temperature 98.3 F (36.8 C), resp. rate 16, height 5\' 5"  (1.651 m), weight 56.7 kg, SpO2 93%. Physical Exam Constitutional:  Elderly female WDWN in NAD, conversant, no obvious deformities; lying in bed comfortably Eyes:  Pupils equal, round; sclera anicteric; moist conjunctiva; no lid lag HENT:  Oral mucosa moist; good dentition  Neck:  No masses palpated, trachea midline; no thyromegaly Lungs:  CTA bilaterally; normal respiratory effort CV:  Irregular rate and rhythm; no murmurs; extremities well-perfused  with no edema Abd:  +bowel sounds, soft, non-tender, no palpable organomegaly; no palpable hernias Musc:  Unable to assess gait; no apparent clubbing or cyanosis in extremities Lymphatic:  No palpable cervical or axillary lymphadenopathy Skin:  Warm, dry; no sign of jaundice Psychiatric - alert and oriented x 4; calm mood and affect  Assessment/Plan: Right colon mass with GI bleeding - probable colon cancer with apparent extension through the wall of the colon with adjacent enlarged lymph nodes and possible involvement of the terminal ileum. Congestive heart failure Chronic kidney disease Recent pneumonia Acute blood loss anemia   Recommendations: Hold any long-acting anticoagulation TRH - does the patient need to be cleared by Cardiology for general anesthesia?  Surgery planned for Monday. Recommend right hemicolectomy on 2/17 by Dr. Michaell Cowing.  This will likely be a minimally invasive approach. Gentle bowel prep with Miralax through the weekend Clear liquids - DO NOT ADVANCE PAST CLEARS Transfuse as needed.  Have type and cross available for surgery.  Wilmon Arms Ariyon Gerstenberger 09/19/2023, 11:30 AM

## 2023-09-19 NOTE — Progress Notes (Signed)
 PROGRESS NOTE  Mallory Stephens JYN:[id number] DOB: 03/10/1933 DOA: 09/18/2023 PCP: Mila Palmer, MD   LOS: 1 day   Brief Narrative / Interim history: 89 year old female with history of breast cancer, HTN, permanent A-fib on Eliquis comes into the hospital with anemia, abdominal discomfort as well as maroon stools.  She has a history of iron deficiency anemia for which she has been worked up by hematology without a clear cause.  Over the last couple of days prior to admission has noticed that her stools were maroon and has been having abdominal discomfort.  She discussed with her hematologist, and eventually presented to the ER with concerns for GI bleed.  CT scan on admission raised concern about an irregular mass in the cecum/ascending colon, concerning for colonic malignancy.  GI was consulted, she was transfused unit of packed red blood cells and was admitted to the hospital  Subjective / 24h Interval events: She is doing well this morning.  Denies any abdominal pain, no nausea or vomiting.  Her niece is at bedside.  Assesement and Plan: Principal problem Lower GI bleed, acute blood loss anemia, iron deficiency anemia, concern for colonic malignancy -patient was transfused a unit of packed red blood cells on 2/14 for hemoglobin of 7.3, improved appropriately and repeat hemoglobin is 8.2.  No further frank bleeding, continue to closely monitor -Gastroenterology consulted, appreciate input.  Discussed with Dr. Lorenso Quarry, recommending surgery input.  General surgery consulted, discussed with Dr. Corliss Skains  Active problems Chronic diastolic CHF-she was recently admitted to the hospital and discharged in January 2075 for acute on chronic diastolic CHF, responded well to diuretics.  Has been taking furosemide since, and currently does not appreciate any significant fluid retention -There was some concern about slight fluid overload on the CT angiogram on admission, however clinically she appears  euvolemic -Resume home furosemide  Permanent atrial fibrillation-continue metoprolol, hold Eliquis in the setting of GI bleed  History of breast cancer-follows with oncology as an outpatient, continue anastrozole  Mood do -continue paroxetine  Hyperlipidemia-continue statin  Scheduled Meds:  anastrozole  1 mg Oral q AM   famotidine  40 mg Oral QHS   fesoterodine  4 mg Oral Daily   losartan  25 mg Oral Daily   metoprolol tartrate  25 mg Oral BID   PARoxetine  10 mg Oral BH-q7a   pravastatin  20 mg Oral QHS   Continuous Infusions: PRN Meds:.acetaminophen **OR** acetaminophen, albuterol, ondansetron **OR** ondansetron (ZOFRAN) IV  Current Outpatient Medications  Medication Instructions   acetaminophen (TYLENOL) 325 mg, Every 6 hours PRN   anastrozole (ARIMIDEX) 1 mg, Oral, Daily   cholecalciferol (VITAMIN D3) 1,000 Units, Oral, Daily   Eliquis 5 mg, Oral, 2 times daily   famotidine (PEPCID) 40 mg, Daily at bedtime   furosemide (LASIX) 40 mg, Oral, Daily   furosemide (LASIX) 20 mg, Oral, Every morning   ibandronate (BONIVA) 150 MG tablet TAKE 1 TABLET BY MOUTH EVERY 30 DAYS ON THE 10TH DAY OF EACH MONTH IN THE MORNING WITH A FULL GLASS OF WATER,ON AN EMPTY STOMACH AND DO NOT TAKE ANYTHING ELSE BY MOUTH OR LIE DOWN FOR THE NEXT 30 MIN   losartan (COZAAR) 25 mg, Oral, Daily   metoprolol tartrate (LOPRESSOR) 25 MG tablet TAKE 1 TABLET TWICE A DAY   PARoxetine (PAXIL) 10 mg, BH-each morning   polyvinyl alcohol (LIQUIFILM TEARS) 1.4 % ophthalmic solution 1-2 drops, Every 6 hours PRN   pravastatin (PRAVACHOL) 20 mg, Daily at bedtime   solifenacin (  VESICARE) 5 mg, Every morning    Diet Orders (From admission, onward)     Start     Ordered   09/19/23 0001  Diet NPO time specified Except for: [employer], Sips with Meds  Diet effective midnight       Question Answer Comment  Except for Ice Chips   Except for Sips with Meds      09/18/23 1806            DVT prophylaxis: SCDs  Start: 09/18/23 1806   Lab Results  Component Value Date   PLT 369 09/18/2023      Code Status: Limited: Do not attempt resuscitation (DNR) -DNR-LIMITED -Do Not Intubate/DNI   Family Communication: niece at bedside   Status is: Inpatient Remains inpatient appropriate because: severity of illness  Level of care: Telemetry  Consultants:  General surgery Gastroenterology   Objective: Vitals:   09/18/23 2045 09/18/23 2123 09/19/23 0100 09/19/23 0500  BP: (!) 170/101 (!) 168/75 (!) 163/66 (!) 157/97  Pulse:  82 81 87  Resp:  17  16  Temp: 98.4 F (36.9 C) 98.2 F (36.8 C) 98.5 F (36.9 C) 98.3 F (36.8 C)  TempSrc: Oral Oral Oral   SpO2: 95% 95% 94% 93%  Weight:      Height:        Intake/Output Summary (Last 24 hours) at 09/19/2023 1035 Last data filed at 09/19/2023 0100 Gross per 24 hour  Intake 650 ml  Output 1 ml  Net 649 ml   Wt Readings from Last 3 Encounters:  09/18/23 56.7 kg  08/17/23 55.3 kg  06/18/23 60.9 kg    Examination:  Constitutional: NAD Eyes: no scleral icterus ENMT: Mucous membranes are moist.  Neck: normal, supple Respiratory: Clear, no wheezing or crackles Cardiovascular: Irregular, trace edema Abdomen: non distended, no tenderness. Bowel sounds positive.  Musculoskeletal: no clubbing / cyanosis.    Data Reviewed: I have independently reviewed following labs and imaging studies   CBC Recent Labs  Lab 09/14/23 1052 09/18/23 1039 09/18/23 1832 09/19/23 0154  WBC 9.7 9.4  --   --   HGB 7.2* 7.3* 7.1* 8.2*  HCT 24.9* 25.0* 26.4* 27.6*  PLT 331 369  --   --   MCV 93.3 91.6  --   --   MCH 27.0 26.7  --   --   MCHC 28.9* 29.2*  --   --   RDW 18.3* 18.3*  --   --   LYMPHSABS 1.5  --   --   --   MONOABS 0.6  --   --   --   EOSABS 0.2  --   --   --   BASOSABS 0.1  --   --   --     Recent Labs  Lab 09/14/23 1052 09/18/23 1039 09/19/23 0154  NA 139 141 140  K 3.0* 3.9 4.0  CL 101 104 106  CO2 31 29 26   GLUCOSE 120* 97  102*  BUN 21 15 15   CREATININE 1.11* 0.81 0.76  CALCIUM 9.3 9.5 9.0  AST 9* 19  --   ALT 5 5  --   ALKPHOS 54 46  --   BILITOT 0.4 0.7  --   ALBUMIN 3.4* 3.3*  --     ------------------------------------------------------------------------------------------------------------------ No results for input(s): "CHOL", "HDL", "LDLCALC", "TRIG", "CHOLHDL", "LDLDIRECT" in the last 72 hours.  No results found for: "HGBA1C" ------------------------------------------------------------------------------------------------------------------ No results for input(s): "TSH", "T4TOTAL", "T3FREE", "THYROIDAB" in the last 72  hours.  Invalid input(s): "FREET3"  Cardiac Enzymes No results for input(s): "CKMB", "TROPONINI", "MYOGLOBIN" in the last 168 hours.  Invalid input(s): "CK" ------------------------------------------------------------------------------------------------------------------    Component Value Date/Time   BNP 897.0 (H) 08/13/2023 1146    CBG: No results for input(s): "GLUCAP" in the last 168 hours.  No results found for this or any previous visit (from the past 240 hours).   Radiology Studies: CT ANGIO GI BLEED Result Date: 09/18/2023 CLINICAL DATA:  Lower GI bleed (Ped 0-17y) lower GI bleeding, mild abd discomfort. Lower abdominal pain. Bloody stools. Low hemoglobin. EXAM: CTA ABDOMEN AND PELVIS WITHOUT AND WITH CONTRAST TECHNIQUE: Multidetector CT imaging of the abdomen and pelvis was performed using the standard protocol during bolus administration of intravenous contrast. Multiplanar reconstructed images and MIPs were obtained and reviewed to evaluate the vascular anatomy. RADIATION DOSE REDUCTION: This exam was performed according to the departmental dose-optimization program which includes automated exposure control, adjustment of the mA and/or kV according to patient size and/or use of iterative reconstruction technique. CONTRAST:  80mL OMNIPAQUE IOHEXOL 350 MG/ML SOLN  COMPARISON:  CT scan abdomen and pelvis from 04/06/2022. FINDINGS: VASCULAR Aorta: Normal caliber aorta without aneurysm, dissection, vasculitis or significant stenosis. Celiac: Patent without evidence of aneurysm, dissection, vasculitis or significant stenosis. There is mild narrowing at the origin to calcified plaques. SMA: Patent without evidence of aneurysm, dissection, vasculitis or significant stenosis. There is mild-to-moderate narrowing at the origin due to calcified plaques. Renals: Both renal arteries are patent without evidence of aneurysm, dissection, vasculitis, fibromuscular dysplasia or significant stenosis. IMA: Patent without evidence of aneurysm, dissection, vasculitis or significant stenosis. Inflow: Patent without evidence of aneurysm, dissection, vasculitis or significant stenosis. Proximal Outflow: Bilateral common femoral and visualized portions of the superficial and profunda femoral arteries are patent without evidence of aneurysm, dissection, vasculitis or significant stenosis. Veins: No obvious venous abnormality within the limitations of this arterial phase study. Review of the MIP images confirms the above findings. NON-VASCULAR Lower chest: Redemonstration of peripheral subpleural reticulations in the visualized bilateral lung bases. However, there is superimposed smooth interlobular septal thickening and bilateral trace pleural effusions, concerning for congestive heart failure/pulmonary edema. There is a 1 cm sized calcified granuloma in the right lung lower lobe. The lung bases are otherwise clear. No pleural effusion. The heart is normal in size. No pericardial effusion. Hepatobiliary: The liver is normal in size. Non-cirrhotic configuration. No suspicious mass. No intrahepatic bile duct dilation. There is mild prominence of the extrahepatic bile duct, most likely due to post cholecystectomy status. Gallbladder is surgically absent. Pancreas: Unremarkable. No pancreatic ductal  dilatation or surrounding inflammatory changes. Spleen: Within normal limits. No focal lesion. Adrenals/Urinary Tract: Adrenal glands are unremarkable. No suspicious renal mass. No hydronephrosis. No renal or ureteric calculi. Urinary bladder is under distended, precluding optimal assessment. However, no large mass or stones identified. No perivesical fat stranding. Stomach/Bowel: There is an approximately 8 cm long irregular heterogeneous markedly thick mass centered in the medial wall of the cecum/proximal ascending colon with extension outside the serosa, compatible with colonic malignancy. The terminal ileum is also likely involved in the tumor. The appendix is unremarkable. There are few adjacent heterogeneous subcentimeter sized lymph node, favored to represent locoregional metastases. There are hyperattenuating areas in the lumen, which may represent blood products. However, no active extravasation of contrast noted. No disproportionate dilation of the small or large bowel loops. There are multiple diverticula throughout the colon without diverticulitis. There is also a diverticulum arising from 3/4  part of duodenum. Vascular/Lymphatic: Small amount of ascites noted in the dependent pelvis. No pneumoperitoneum. No abdominal or pelvic lymphadenopathy, by size criteria. No aneurysmal dilation of the major abdominal arteries. There are moderate peripheral atherosclerotic vascular calcifications of the aorta and its major branches. Reproductive: The uterus is unremarkable. No large adnexal mass. Other: There is a tiny fat containing umbilical hernia. The soft tissues and abdominal wall are otherwise unremarkable. Musculoskeletal: No suspicious osseous lesions. There are mild - moderate multilevel degenerative changes in the visualized spine. There is mild-to-moderate compression deformity of L1 vertebra and moderate compression deformity of T11 vertebrae, similar to prior study. IMPRESSION: 1. No active  extravasation of contrast noted to suggest active bleeding. 2. There is an irregular mass centered in the cecum/ascending colon, as described above, compatible with colonic malignancy. There are hyperattenuating areas in the lumen, which may represent blood products. 3. No distant metastatic disease identified within the abdomen or pelvis. 4. Findings favor congestive heart failure/pulmonary edema, as described above. 5. Multiple other nonacute observations, as described above. Aortic Atherosclerosis (ICD10-I70.0). Electronically Signed   By: Jules Schick M.D.   On: 09/18/2023 14:12     Pamella Pert, MD, PhD [facility]  Between 7 am - 7 pm I am available, please contact me via Amion (for emergencies) or Securechat (non urgent messages)  Between 7 pm - 7 am I am not available, please contact night coverage MD/APP via Amion

## 2023-09-20 ENCOUNTER — Inpatient Hospital Stay (HOSPITAL_COMMUNITY): Payer: Medicare HMO

## 2023-09-20 DIAGNOSIS — K922 Gastrointestinal hemorrhage, unspecified: Secondary | ICD-10-CM | POA: Diagnosis not present

## 2023-09-20 LAB — COMPREHENSIVE METABOLIC PANEL
ALT: 7 U/L (ref 0–44)
AST: 11 U/L — ABNORMAL LOW (ref 15–41)
Albumin: 2.7 g/dL — ABNORMAL LOW (ref 3.5–5.0)
Alkaline Phosphatase: 45 U/L (ref 38–126)
Anion gap: 9 (ref 5–15)
BUN: 14 mg/dL (ref 8–23)
CO2: 25 mmol/L (ref 22–32)
Calcium: 8.7 mg/dL — ABNORMAL LOW (ref 8.9–10.3)
Chloride: 104 mmol/L (ref 98–111)
Creatinine, Ser: 0.81 mg/dL (ref 0.44–1.00)
GFR, Estimated: >60 mL/min (ref >60)
Glucose, Bld: 91 mg/dL (ref 70–99)
Potassium: 3.4 mmol/L — ABNORMAL LOW (ref 3.5–5.1)
Sodium: 138 mmol/L (ref 135–145)
Total Bilirubin: 1.6 mg/dL — ABNORMAL HIGH (ref 0.0–1.2)
Total Protein: 5.4 g/dL — ABNORMAL LOW (ref 6.5–8.1)

## 2023-09-20 LAB — CBC
HCT: 27.8 % — ABNORMAL LOW (ref 36.0–46.0)
Hemoglobin: 8 g/dL — ABNORMAL LOW (ref 12.0–15.0)
MCH: 26.9 pg (ref 26.0–34.0)
MCHC: 28.8 g/dL — ABNORMAL LOW (ref 30.0–36.0)
MCV: 93.6 fL (ref 80.0–100.0)
Platelets: 289 10*3/uL (ref 150–400)
RBC: 2.97 MIL/uL — ABNORMAL LOW (ref 3.87–5.11)
RDW: 18 % — ABNORMAL HIGH (ref 11.5–15.5)
WBC: 8.8 10*3/uL (ref 4.0–10.5)
nRBC: 0 % (ref 0.0–0.2)

## 2023-09-20 LAB — SURGICAL PCR SCREEN
MRSA, PCR: NEGATIVE
Staphylococcus aureus: NEGATIVE

## 2023-09-20 LAB — MAGNESIUM: Magnesium: 2 mg/dL (ref 1.7–2.4)

## 2023-09-20 MED ORDER — HEPARIN SODIUM (PORCINE) 5000 UNIT/ML IJ SOLN
5000.0000 [IU] | Freq: Once | INTRAMUSCULAR | Status: AC
  Administered 2023-09-21: 5000 [IU] via SUBCUTANEOUS
  Filled 2023-09-20 (×2): qty 1

## 2023-09-20 MED ORDER — NEOMYCIN SULFATE 500 MG PO TABS
1000.0000 mg | ORAL_TABLET | ORAL | Status: AC
  Administered 2023-09-20 (×3): 1000 mg via ORAL
  Filled 2023-09-20 (×3): qty 2

## 2023-09-20 MED ORDER — HEPARIN SODIUM (PORCINE) 5000 UNIT/ML IJ SOLN: 5000.0000 [IU] | Freq: Once | INTRAMUSCULAR | Status: DC

## 2023-09-20 MED ORDER — ENSURE PRE-SURGERY PO LIQD
296.0000 mL | Freq: Once | ORAL | Status: AC
  Administered 2023-09-21: 296 mL via ORAL
  Filled 2023-09-20: qty 296

## 2023-09-20 MED ORDER — SODIUM CHLORIDE 0.9 % IV SOLN
2.0000 g | INTRAVENOUS | Status: AC
  Administered 2023-09-21: 2 g via INTRAVENOUS
  Filled 2023-09-20: qty 2

## 2023-09-20 MED ORDER — METRONIDAZOLE 500 MG PO TABS
1000.0000 mg | ORAL_TABLET | ORAL | Status: AC
  Administered 2023-09-20 (×3): 1000 mg via ORAL
  Filled 2023-09-20 (×3): qty 2

## 2023-09-20 MED ORDER — ALVIMOPAN 12 MG PO CAPS
12.0000 mg | ORAL_CAPSULE | ORAL | Status: AC
  Administered 2023-09-21: 12 mg via ORAL
  Filled 2023-09-20: qty 1

## 2023-09-20 MED ORDER — ACETAMINOPHEN 500 MG PO TABS
1000.0000 mg | ORAL_TABLET | ORAL | Status: AC
  Administered 2023-09-21: 1000 mg via ORAL
  Filled 2023-09-20 (×2): qty 2

## 2023-09-20 MED ORDER — ENSURE PRE-SURGERY PO LIQD
592.0000 mL | Freq: Once | ORAL | Status: AC
  Administered 2023-09-20: 592 mL via ORAL
  Filled 2023-09-20: qty 592

## 2023-09-20 MED ORDER — CHLORHEXIDINE GLUCONATE CLOTH 2 % EX PADS
6.0000 | MEDICATED_PAD | Freq: Once | CUTANEOUS | Status: AC
  Administered 2023-09-21: 6 via TOPICAL

## 2023-09-20 MED ORDER — CHLORHEXIDINE GLUCONATE CLOTH 2 % EX PADS
6.0000 | MEDICATED_PAD | Freq: Once | CUTANEOUS | Status: AC
  Administered 2023-09-20: 6 via TOPICAL

## 2023-09-20 NOTE — Progress Notes (Signed)
 PROGRESS NOTE  Mallory Stephens:[id number] DOB: 06/10/1933 DOA: 09/18/2023 PCP: Mila Palmer, MD   LOS: 2 days   Brief Narrative / Interim history: 89 year old female with history of breast cancer, HTN, permanent A-fib on Eliquis comes into the hospital with anemia, abdominal discomfort as well as maroon stools.  She has a history of iron deficiency anemia for which she has been worked up by hematology without a clear cause.  Over the last couple of days prior to admission has noticed that her stools were maroon and has been having abdominal discomfort.  She discussed with her hematologist, and eventually presented to the ER with concerns for GI bleed.  CT scan on admission raised concern about an irregular mass in the cecum/ascending colon, concerning for colonic malignancy.  GI was consulted, she was transfused unit of packed red blood cells and was admitted to the hospital  Subjective / 24h Interval events: She seems to be in good spirits today.  She denies any chest pain, no abdominal pain, no nausea or vomiting.  Has been walking in the room without dyspnea.  Denies any fluid retention or lower extremity swelling.  Assesement and Plan: Principal problem Lower GI bleed, acute blood loss anemia, iron deficiency anemia, concern for colonic malignancy -patient was transfused a unit of packed red blood cells on 2/14 for hemoglobin of 7.3, improved appropriately and repeat hemoglobin is 8.2.  No further frank bleeding, continue to closely monitor -Gastroenterology and General Surgery consulted, it appears that she needs to have surgery, tentatively planned for tomorrow.  Discontinue ARB perioperatively  Active problems Chronic diastolic CHF-she was recently admitted to the hospital and discharged in January 2075 for acute on chronic diastolic CHF, responded well to diuretics.  Has been taking furosemide since, and currently does not appreciate any significant fluid retention -There was some  concern about slight fluid overload on the CT angiogram on admission, however clinically she appears euvolemic -Sinew home furosemide.  She is on room air, denies any dyspnea, able to move in the room without difficulties.  Has no lower extremity swelling.  Permanent atrial fibrillation-continue metoprolol, hold Eliquis in the setting of GI bleed  History of breast cancer-follows with oncology as an outpatient, continue anastrozole  Mood do -continue paroxetine  Hyperlipidemia-continue statin  Scheduled Meds:  [START ON 09/21/2023] acetaminophen  1,000 mg Oral On Call to OR   [START ON 09/21/2023] alvimopan  12 mg Oral On Call to OR   anastrozole  1 mg Oral q AM   Chlorhexidine Gluconate Cloth  6 each Topical Once   And   Chlorhexidine Gluconate Cloth  6 each Topical Once   famotidine  40 mg Oral QHS   [START ON 09/21/2023] feeding supplement  296 mL Oral Once   feeding supplement  592 mL Oral Once   fesoterodine  4 mg Oral Daily   furosemide  40 mg Oral Daily   [START ON 09/21/2023] heparin  5,000 Units Subcutaneous Once   metoprolol tartrate  25 mg Oral BID   neomycin  1,000 mg Oral 3 times per day   And   metroNIDAZOLE  1,000 mg Oral 3 times per day   PARoxetine  10 mg Oral BH-q7a   pravastatin  20 mg Oral QHS   Continuous Infusions:  [START ON 09/21/2023] cefoTEtan (CEFOTAN) IV     PRN Meds:.acetaminophen **OR** acetaminophen, albuterol, ondansetron **OR** ondansetron (ZOFRAN) IV  Current Outpatient Medications  Medication Instructions   acetaminophen (TYLENOL) 325 mg, Every 6 hours  PRN   anastrozole (ARIMIDEX) 1 mg, Oral, Daily   cholecalciferol (VITAMIN D3) 1,000 Units, Oral, Daily   Eliquis 5 mg, Oral, 2 times daily   famotidine (PEPCID) 40 mg, Daily at bedtime   furosemide (LASIX) 40 mg, Oral, Daily   furosemide (LASIX) 20 mg, Oral, Every morning   ibandronate (BONIVA) 150 MG tablet TAKE 1 TABLET BY MOUTH EVERY 30 DAYS ON THE 10TH DAY OF EACH MONTH IN THE MORNING WITH  A FULL GLASS OF WATER,ON AN EMPTY STOMACH AND DO NOT TAKE ANYTHING ELSE BY MOUTH OR LIE DOWN FOR THE NEXT 30 MIN   losartan (COZAAR) 25 mg, Oral, Daily   metoprolol tartrate (LOPRESSOR) 25 MG tablet TAKE 1 TABLET TWICE A DAY   PARoxetine (PAXIL) 10 mg, BH-each morning   polyvinyl alcohol (LIQUIFILM TEARS) 1.4 % ophthalmic solution 1-2 drops, Every 6 hours PRN   pravastatin (PRAVACHOL) 20 mg, Daily at bedtime   solifenacin (VESICARE) 5 mg, Every morning    Diet Orders (From admission, onward)     Start     Ordered   09/21/23 0430  Diet NPO time specified Except for: [employer], Sips with Meds  Diet effective ____       Question Answer Comment  Except for Ice Chips   Except for Sips with Meds      09/20/23 1112   09/19/23 1125  Diet clear liquid Fluid consistency: Thin  Diet effective now       Question:  Fluid consistency:  Answer:  Thin   09/19/23 1124            DVT prophylaxis: heparin injection 5,000 Units Start: 09/21/23 0600 SCD's Start: 09/20/23 1113 SCDs Start: 09/18/23 1806   Lab Results  Component Value Date   PLT 289 09/20/2023      Code Status: Limited: Do not attempt resuscitation (DNR) -DNR-LIMITED -Do Not Intubate/DNI   Family Communication: niece at bedside   Status is: Inpatient Remains inpatient appropriate because: severity of illness  Level of care: Telemetry  Consultants:  General surgery Gastroenterology   Objective: Vitals:   09/19/23 1350 09/19/23 2102 09/19/23 2114 09/20/23 0419  BP: (!) 156/75 (!) 151/75  (!) 163/74  Pulse: 95 85  76  Resp: 14 16  16   Temp: 97.7 F (36.5 C) 98.7 F (37.1 C)  98.2 F (36.8 C)  TempSrc:      SpO2: 93% (!) 89% 95% 91%  Weight:      Height:        Intake/Output Summary (Last 24 hours) at 09/20/2023 1200 Last data filed at 09/19/2023 1500 Gross per 24 hour  Intake 500 ml  Output 1 ml  Net 499 ml   Wt Readings from Last 3 Encounters:  09/18/23 56.7 kg  08/17/23 55.3 kg  06/18/23 60.9 kg     Examination:  Constitutional: NAD Eyes: lids and conjunctivae normal, no scleral icterus ENMT: mmm Neck: normal, supple Respiratory: clear to auscultation bilaterally, no wheezing, no crackles. Normal respiratory effort.  Cardiovascular: Irregular. No LE edema. Abdomen: soft, no distention, no tenderness. Bowel sounds positive.    Data Reviewed: I have independently reviewed following labs and imaging studies   CBC Recent Labs  Lab 09/14/23 1052 09/18/23 1039 09/18/23 1832 09/19/23 0154 09/19/23 1754 09/20/23 0559  WBC 9.7 9.4  --   --   --  8.8  HGB 7.2* 7.3* 7.1* 8.2* 8.1* 8.0*  HCT 24.9* 25.0* 26.4* 27.6* 28.2* 27.8*  PLT 331 369  --   --   --  289  MCV 93.3 91.6  --   --   --  93.6  MCH 27.0 26.7  --   --   --  26.9  MCHC 28.9* 29.2*  --   --   --  28.8*  RDW 18.3* 18.3*  --   --   --  18.0*  LYMPHSABS 1.5  --   --   --   --   --   MONOABS 0.6  --   --   --   --   --   EOSABS 0.2  --   --   --   --   --   BASOSABS 0.1  --   --   --   --   --     Recent Labs  Lab 09/14/23 1052 09/18/23 1039 09/19/23 0154 09/20/23 0559  NA 139 141 140 138  K 3.0* 3.9 4.0 3.4*  CL 101 104 106 104  CO2 31 29 26 25   GLUCOSE 120* 97 102* 91  BUN 21 15 15 14   CREATININE 1.11* 0.81 0.76 0.81  CALCIUM 9.3 9.5 9.0 8.7*  AST 9* 19  --  11*  ALT 5 5  --  7  ALKPHOS 54 46  --  45  BILITOT 0.4 0.7  --  1.6*  ALBUMIN 3.4* 3.3*  --  2.7*  MG  --   --   --  2.0    ------------------------------------------------------------------------------------------------------------------ No results for input(s): "CHOL", "HDL", "LDLCALC", "TRIG", "CHOLHDL", "LDLDIRECT" in the last 72 hours.  No results found for: "HGBA1C" ------------------------------------------------------------------------------------------------------------------ No results for input(s): "TSH", "T4TOTAL", "T3FREE", "THYROIDAB" in the last 72 hours.  Invalid input(s): "FREET3"  Cardiac Enzymes No results for  input(s): "CKMB", "TROPONINI", "MYOGLOBIN" in the last 168 hours.  Invalid input(s): "CK" ------------------------------------------------------------------------------------------------------------------    Component Value Date/Time   BNP 897.0 (H) 08/13/2023 1146    CBG: No results for input(s): "GLUCAP" in the last 168 hours.  No results found for this or any previous visit (from the past 240 hours).   Radiology Studies: No results found.    Pamella Pert, MD, PhD [facility]  Between 7 am - 7 pm I am available, please contact me via Amion (for emergencies) or Securechat (non urgent messages)  Between 7 pm - 7 am I am not available, please contact night coverage MD/APP via Amion

## 2023-09-20 NOTE — Progress Notes (Addendum)
 Patient ID: Mallory Stephens, female   DOB: [birth year], 89 y.o.   MRN: [id number]   Acute Care Surgery Service Progress Note:    Chief Complaint/Subjective: No co No family at bs Tolerated miralax prep  Objective: Vital signs in last 24 hours: Temp:  [97.7 F (36.5 C)-98.7 F (37.1 C)] 98.2 F (36.8 C) (02/16 0419) Pulse Rate:  [76-95] 76 (02/16 0419) Resp:  [14-16] 16 (02/16 0419) BP: (151-163)/(74-75) 163/74 (02/16 0419) SpO2:  [89 %-95 %] 91 % (02/16 0419) Last BM Date : 09/17/23  Intake/Output from previous day: 02/15 0701 - 02/16 0700 In: 500 [P.O.:500] Out: 1 [Stool:1] Intake/Output this shift: No intake/output data recorded.  Lungs: nonlabored  Cardiovascular: reg  Abd: soft, nt, nd  Extremities: no edema, +SCDs  Neuro: alert, nonfocal  Lab Results: CBC  Recent Labs    09/18/23 1039 09/18/23 1832 09/19/23 1754 09/20/23 0559  WBC 9.4  --   --  8.8  HGB 7.3*   < > 8.1* 8.0*  HCT 25.0*   < > 28.2* 27.8*  PLT 369  --   --  289   < > = values in this interval not displayed.   BMET Recent Labs    09/19/23 0154 09/20/23 0559  NA 140 138  K 4.0 3.4*  CL 106 104  CO2 26 25  GLUCOSE 102* 91  BUN 15 14  CREATININE 0.76 0.81  CALCIUM 9.0 8.7*   LFT    Latest Ref Rng & Units 09/20/2023    5:59 AM 09/18/2023   10:39 AM 09/14/2023   10:52 AM  Hepatic Function  Total Protein 6.5 - 8.1 g/dL 5.4  5.8  5.9   Albumin 3.5 - 5.0 g/dL 2.7  3.3  3.4   AST 15 - 41 U/L 11  19  9    ALT 0 - 44 U/L 7  5  5    Alk Phosphatase 38 - 126 U/L 45  46  54   Total Bilirubin 0.0 - 1.2 mg/dL 1.6  0.7  0.4    PT/INR No results for input(s): "LABPROT", "INR" in the last 72 hours. ABG No results for input(s): "PHART", "HCO3" in the last 72 hours.  Invalid input(s): "PCO2", "PO2"  Studies/Results:  Anti-infectives: Anti-infectives (From admission, onward)    Start     Dose/Rate Route Frequency Ordered Stop   09/21/23 0600  cefoTEtan (CEFOTAN) 2 g in sodium chloride  0.9 % 100 mL IVPB        2 g 200 mL/hr over 30 Minutes Intravenous On call to O.R. 09/20/23 1112 09/22/23 0559   09/20/23 1400  neomycin (MYCIFRADIN) tablet 1,000 mg       Placed in "And" Linked Group   1,000 mg Oral 3 times per day 09/20/23 1112 09/21/23 1359   09/20/23 1400  metroNIDAZOLE (FLAGYL) tablet 1,000 mg       Placed in "And" Linked Group   1,000 mg Oral 3 times per day 09/20/23 1112 09/21/23 1359       Medications: Scheduled Meds:  [START ON 09/21/2023] acetaminophen  1,000 mg Oral On Call to OR   [START ON 09/21/2023] alvimopan  12 mg Oral On Call to OR   anastrozole  1 mg Oral q AM   Chlorhexidine Gluconate Cloth  6 each Topical Once   And   Chlorhexidine Gluconate Cloth  6 each Topical Once   famotidine  40 mg Oral QHS   [START ON 09/21/2023] feeding supplement  296 mL Oral  Once   feeding supplement  592 mL Oral Once   fesoterodine  4 mg Oral Daily   furosemide  40 mg Oral Daily   [START ON 09/21/2023] heparin  5,000 Units Subcutaneous Once   metoprolol tartrate  25 mg Oral BID   neomycin  1,000 mg Oral 3 times per day   And   metroNIDAZOLE  1,000 mg Oral 3 times per day   PARoxetine  10 mg Oral BH-q7a   pravastatin  20 mg Oral QHS   Continuous Infusions:  [START ON 09/21/2023] cefoTEtan (CEFOTAN) IV     PRN Meds:.acetaminophen **OR** acetaminophen, albuterol, ondansetron **OR** ondansetron (ZOFRAN) IV  Assessment/Plan: Patient Active Problem List   Diagnosis Date Noted   Lower GI bleeding 09/18/2023   Heart failure (HCC) 08/14/2023   Acute CHF (congestive heart failure) (HCC) 08/13/2023   Breast cancer (HCC) 10/25/2022   Permanent atrial fibrillation (HCC) 10/25/2022   Essential hypertension 10/25/2022   Hyperlipidemia 10/25/2022   Hypokalemia 10/25/2022   Chronic anemia 10/25/2022   Acute respiratory failure with hypoxia secondary to CAP vs/+ new CHF exacerbation 10/25/2022   Acute on chronic diastolic CHF (congestive heart failure) (HCC) 10/25/2022    CAP (community acquired pneumonia) 10/25/2022   Genetic testing 11/26/2020   History of basal cell carcinoma    Family history of breast cancer    Family history of prostate cancer    Malignant neoplasm of upper-inner quadrant of left breast in female, 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/2020   Hypokalemia 04/29/2017   Leukocytosis 04/29/2017   Syncope 04/29/2017   UTI (urinary tract infection) 04/28/2017   Fall 04/28/2017   Nausea and vomiting 04/28/2017   Generalized weakness 04/28/2017   Constipation 04/28/2017   Diverticulitis    Edema of extremities 09/03/2016   Dyslipidemia    Permanent atrial fibrillation (HCC) 07/08/2013   Essential hypertension, benign 07/08/2013   Long term (current) use of anticoagulants 07/08/2013   Encounter for long-term (current) use of other medications 07/08/2013   s/p   Right colon mass with GI bleeding - probable colon cancer with apparent extension through the wall of the colon with adjacent enlarged lymph nodes and possible involvement of the terminal ileum. Congestive heart failure Chronic kidney disease Recent pneumonia Acute blood loss anemia   Patient is tentatively scheduled for laparoscopic assisted partial colectomy on Monday with Dr. Michaell Cowing.  Final timing up to Dr. Michaell Cowing.  However we will go ahead and prep patient for surgery for tomorrow.  She tolerated her MiraLAX bowel prep yesterday.  Will order enhanced recovery protocol shakes and Entereg; preoperative oral antibiotics Subcu heparin on-call to the OR tomorrow morning Maintain type and cross  Discussed with [facility] hospitalist as well as patient nurse  I discussed the procedure in detail.    We discussed the risks and benefits of surgery including, but not limited to bleeding, infection (such as wound infection, abdominal abscess), injury to surrounding structures, blood clot formation, urinary retention, incisional hernia, anastomotic stricture, anastomotic leak, anesthesia  risks, pulmonary & cardiac complications such as pneumonia &/or heart attack, need for additional procedures, ileus, & prolonged hospitalization.  We discussed the typical postoperative recovery course, including limitations & restrictions postoperatively. I explained that the likelihood of improvement in their symptoms is good.  Did discuss that she is at slight increased risk for complications given her age, ileus, need for transfusion & potential need for rehab  Data reviewed: consult notes x2, progress note, Ct scan, labs x 48hrs, echo 08/2023  LOS: 2 days    Mary Sella. Andrey Campanile, MD, FACS General, Bariatric, & Minimally Invasive Surgery [phone] [facility], [facility]

## 2023-09-20 NOTE — Progress Notes (Signed)
   09/20/23 1216  TOC Brief Assessment  Insurance and Status Reviewed  Patient has primary care physician Yes  Home environment has been reviewed Single family home  Prior level of function: Independent/modified independent  Prior/Current Home Services No current home services  Social Drivers of Health Review SDOH reviewed no interventions necessary  Readmission risk has been reviewed Yes  Transition of care needs transition of care needs identified, TOC will continue to follow

## 2023-09-20 NOTE — H&P (View-Only) (Signed)
 Patient ID: Mallory Stephens, female   DOB: [birth year], 89 y.o.   MRN: [id number]   Acute Care Surgery Service Progress Note:    Chief Complaint/Subjective: No co No family at bs Tolerated miralax prep  Objective: Vital signs in last 24 hours: Temp:  [97.7 F (36.5 C)-98.7 F (37.1 C)] 98.2 F (36.8 C) (02/16 0419) Pulse Rate:  [76-95] 76 (02/16 0419) Resp:  [14-16] 16 (02/16 0419) BP: (151-163)/(74-75) 163/74 (02/16 0419) SpO2:  [89 %-95 %] 91 % (02/16 0419) Last BM Date : 09/17/23  Intake/Output from previous day: 02/15 0701 - 02/16 0700 In: 500 [P.O.:500] Out: 1 [Stool:1] Intake/Output this shift: No intake/output data recorded.  Lungs: nonlabored  Cardiovascular: reg  Abd: soft, nt, nd  Extremities: no edema, +SCDs  Neuro: alert, nonfocal  Lab Results: CBC  Recent Labs    09/18/23 1039 09/18/23 1832 09/19/23 1754 09/20/23 0559  WBC 9.4  --   --  8.8  HGB 7.3*   < > 8.1* 8.0*  HCT 25.0*   < > 28.2* 27.8*  PLT 369  --   --  289   < > = values in this interval not displayed.   BMET Recent Labs    09/19/23 0154 09/20/23 0559  NA 140 138  K 4.0 3.4*  CL 106 104  CO2 26 25  GLUCOSE 102* 91  BUN 15 14  CREATININE 0.76 0.81  CALCIUM 9.0 8.7*   LFT    Latest Ref Rng & Units 09/20/2023    5:59 AM 09/18/2023   10:39 AM 09/14/2023   10:52 AM  Hepatic Function  Total Protein 6.5 - 8.1 g/dL 5.4  5.8  5.9   Albumin 3.5 - 5.0 g/dL 2.7  3.3  3.4   AST 15 - 41 U/L 11  19  9    ALT 0 - 44 U/L 7  5  5    Alk Phosphatase 38 - 126 U/L 45  46  54   Total Bilirubin 0.0 - 1.2 mg/dL 1.6  0.7  0.4    PT/INR No results for input(s): "LABPROT", "INR" in the last 72 hours. ABG No results for input(s): "PHART", "HCO3" in the last 72 hours.  Invalid input(s): "PCO2", "PO2"  Studies/Results:  Anti-infectives: Anti-infectives (From admission, onward)    Start     Dose/Rate Route Frequency Ordered Stop   09/21/23 0600  cefoTEtan (CEFOTAN) 2 g in sodium chloride  0.9 % 100 mL IVPB        2 g 200 mL/hr over 30 Minutes Intravenous On call to O.R. 09/20/23 1112 09/22/23 0559   09/20/23 1400  neomycin (MYCIFRADIN) tablet 1,000 mg       Placed in "And" Linked Group   1,000 mg Oral 3 times per day 09/20/23 1112 09/21/23 1359   09/20/23 1400  metroNIDAZOLE (FLAGYL) tablet 1,000 mg       Placed in "And" Linked Group   1,000 mg Oral 3 times per day 09/20/23 1112 09/21/23 1359       Medications: Scheduled Meds:  [START ON 09/21/2023] acetaminophen  1,000 mg Oral On Call to OR   [START ON 09/21/2023] alvimopan  12 mg Oral On Call to OR   anastrozole  1 mg Oral q AM   Chlorhexidine Gluconate Cloth  6 each Topical Once   And   Chlorhexidine Gluconate Cloth  6 each Topical Once   famotidine  40 mg Oral QHS   [START ON 09/21/2023] feeding supplement  296 mL Oral  Once   feeding supplement  592 mL Oral Once   fesoterodine  4 mg Oral Daily   furosemide  40 mg Oral Daily   [START ON 09/21/2023] heparin  5,000 Units Subcutaneous Once   metoprolol tartrate  25 mg Oral BID   neomycin  1,000 mg Oral 3 times per day   And   metroNIDAZOLE  1,000 mg Oral 3 times per day   PARoxetine  10 mg Oral BH-q7a   pravastatin  20 mg Oral QHS   Continuous Infusions:  [START ON 09/21/2023] cefoTEtan (CEFOTAN) IV     PRN Meds:.acetaminophen **OR** acetaminophen, albuterol, ondansetron **OR** ondansetron (ZOFRAN) IV  Assessment/Plan: Patient Active Problem List   Diagnosis Date Noted   Lower GI bleeding 09/18/2023   Heart failure (HCC) 08/14/2023   Acute CHF (congestive heart failure) (HCC) 08/13/2023   Breast cancer (HCC) 10/25/2022   Permanent atrial fibrillation (HCC) 10/25/2022   Essential hypertension 10/25/2022   Hyperlipidemia 10/25/2022   Hypokalemia 10/25/2022   Chronic anemia 10/25/2022   Acute respiratory failure with hypoxia secondary to CAP vs/+ new CHF exacerbation 10/25/2022   Acute on chronic diastolic CHF (congestive heart failure) (HCC) 10/25/2022    CAP (community acquired pneumonia) 10/25/2022   Genetic testing 11/26/2020   History of basal cell carcinoma    Family history of breast cancer    Family history of prostate cancer    Malignant neoplasm of upper-inner quadrant of left breast in female, 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/2020   Hypokalemia 04/29/2017   Leukocytosis 04/29/2017   Syncope 04/29/2017   UTI (urinary tract infection) 04/28/2017   Fall 04/28/2017   Nausea and vomiting 04/28/2017   Generalized weakness 04/28/2017   Constipation 04/28/2017   Diverticulitis    Edema of extremities 09/03/2016   Dyslipidemia    Permanent atrial fibrillation (HCC) 07/08/2013   Essential hypertension, benign 07/08/2013   Long term (current) use of anticoagulants 07/08/2013   Encounter for long-term (current) use of other medications 07/08/2013   s/p   Right colon mass with GI bleeding - probable colon cancer with apparent extension through the wall of the colon with adjacent enlarged lymph nodes and possible involvement of the terminal ileum. Congestive heart failure Chronic kidney disease Recent pneumonia Acute blood loss anemia   Patient is tentatively scheduled for laparoscopic assisted partial colectomy on Monday with Dr. Michaell Cowing.  Final timing up to Dr. Michaell Cowing.  However we will go ahead and prep patient for surgery for tomorrow.  She tolerated her MiraLAX bowel prep yesterday.  Will order enhanced recovery protocol shakes and Entereg; preoperative oral antibiotics Subcu heparin on-call to the OR tomorrow morning Maintain type and cross  Discussed with [facility] hospitalist as well as patient nurse  I discussed the procedure in detail.    We discussed the risks and benefits of surgery including, but not limited to bleeding, infection (such as wound infection, abdominal abscess), injury to surrounding structures, blood clot formation, urinary retention, incisional hernia, anastomotic stricture, anastomotic leak, anesthesia  risks, pulmonary & cardiac complications such as pneumonia &/or heart attack, need for additional procedures, ileus, & prolonged hospitalization.  We discussed the typical postoperative recovery course, including limitations & restrictions postoperatively. I explained that the likelihood of improvement in their symptoms is good.  Did discuss that she is at slight increased risk for complications given her age, ileus, need for transfusion & potential need for rehab  Data reviewed: consult notes x2, progress note, Ct scan, labs x 48hrs, echo 08/2023  LOS: 2 days    Mary Sella. Andrey Campanile, MD, FACS General, Bariatric, & Minimally Invasive Surgery [phone] [facility], [facility]

## 2023-09-20 NOTE — Plan of Care (Signed)

## 2023-09-20 NOTE — Plan of Care (Signed)

## 2023-09-21 ENCOUNTER — Inpatient Hospital Stay (HOSPITAL_COMMUNITY): Payer: Medicare HMO

## 2023-09-21 ENCOUNTER — Other Ambulatory Visit: Payer: Self-pay

## 2023-09-21 ENCOUNTER — Inpatient Hospital Stay (HOSPITAL_COMMUNITY): Payer: Medicare HMO | Admitting: Anesthesiology

## 2023-09-21 ENCOUNTER — Encounter (HOSPITAL_COMMUNITY)
Admission: EM | Disposition: A | Discharge status: Skilled Nursing Facility | Payer: Self-pay | Source: Home / Self Care | Attending: Internal Medicine

## 2023-09-21 DIAGNOSIS — I4891 Unspecified atrial fibrillation: Secondary | ICD-10-CM

## 2023-09-21 DIAGNOSIS — I110 Hypertensive heart disease with heart failure: Secondary | ICD-10-CM

## 2023-09-21 DIAGNOSIS — I509 Heart failure, unspecified: Secondary | ICD-10-CM

## 2023-09-21 DIAGNOSIS — K922 Gastrointestinal hemorrhage, unspecified: Secondary | ICD-10-CM | POA: Diagnosis not present

## 2023-09-21 DIAGNOSIS — K6389 Other specified diseases of intestine: Secondary | ICD-10-CM

## 2023-09-21 HISTORY — PX: LAPAROSCOPIC PARTIAL COLECTOMY: SHX5907

## 2023-09-21 LAB — BASIC METABOLIC PANEL
Anion gap: 11 (ref 5–15)
BUN: 12 mg/dL (ref 8–23)
CO2: 23 mmol/L (ref 22–32)
Calcium: 8.9 mg/dL (ref 8.9–10.3)
Chloride: 103 mmol/L (ref 98–111)
Creatinine, Ser: 0.94 mg/dL (ref 0.44–1.00)
GFR, Estimated: 58 mL/min — ABNORMAL LOW (ref >60)
Glucose, Bld: 119 mg/dL — ABNORMAL HIGH (ref 70–99)
Potassium: 3.3 mmol/L — ABNORMAL LOW (ref 3.5–5.1)
Sodium: 137 mmol/L (ref 135–145)

## 2023-09-21 LAB — CBC
HCT: 33.4 % — ABNORMAL LOW (ref 36.0–46.0)
Hemoglobin: 9.4 g/dL — ABNORMAL LOW (ref 12.0–15.0)
MCH: 26.9 pg (ref 26.0–34.0)
MCHC: 28.1 g/dL — ABNORMAL LOW (ref 30.0–36.0)
MCV: 95.7 fL (ref 80.0–100.0)
Platelets: 304 10*3/uL (ref 150–400)
RBC: 3.49 MIL/uL — ABNORMAL LOW (ref 3.87–5.11)
RDW: 17.6 % — ABNORMAL HIGH (ref 11.5–15.5)
WBC: 9.7 10*3/uL (ref 4.0–10.5)
nRBC: 0 % (ref 0.0–0.2)

## 2023-09-21 LAB — PREPARE RBC (CROSSMATCH)

## 2023-09-21 LAB — MAGNESIUM: Magnesium: 1.9 mg/dL (ref 1.7–2.4)

## 2023-09-21 SURGERY — LAPAROSCOPIC PARTIAL COLECTOMY
Anesthesia: General

## 2023-09-21 MED ORDER — AMISULPRIDE (ANTIEMETIC) 5 MG/2ML IV SOLN: 10.0000 mg | Freq: Once | INTRAVENOUS | Status: AC | PRN

## 2023-09-21 MED ORDER — SODIUM CHLORIDE 0.9% FLUSH
3.0000 mL | Freq: Two times a day (BID) | INTRAVENOUS | Status: DC
  Administered 2023-09-21 – 2023-09-25 (×10): 3 mL via INTRAVENOUS

## 2023-09-21 MED ORDER — ONDANSETRON HCL 4 MG/2ML IJ SOLN
INTRAMUSCULAR | Status: AC
  Filled 2023-09-21: qty 2

## 2023-09-21 MED ORDER — FENTANYL CITRATE (PF) 100 MCG/2ML IJ SOLN
INTRAMUSCULAR | Status: DC | PRN
  Administered 2023-09-21 (×2): 50 ug via INTRAVENOUS

## 2023-09-21 MED ORDER — SODIUM CHLORIDE 0.9% IV SOLUTION: Freq: Once | INTRAVENOUS | Status: DC

## 2023-09-21 MED ORDER — PROPOFOL 10 MG/ML IV BOLUS
INTRAVENOUS | Status: AC
  Filled 2023-09-21: qty 20

## 2023-09-21 MED ORDER — BENZOCAINE 10 % MT GEL: Freq: Four times a day (QID) | OROMUCOSAL | Status: DC | PRN

## 2023-09-21 MED ORDER — METHOCARBAMOL 1000 MG/10ML IJ SOLN
1000.0000 mg | Freq: Four times a day (QID) | INTRAMUSCULAR | Status: DC | PRN
  Filled 2023-09-21: qty 10

## 2023-09-21 MED ORDER — BUPIVACAINE-EPINEPHRINE 0.25% -1:200000 IJ SOLN
INTRAMUSCULAR | Status: AC
  Filled 2023-09-21: qty 1

## 2023-09-21 MED ORDER — ONDANSETRON HCL 4 MG/2ML IJ SOLN: 4.0000 mg | Freq: Once | INTRAMUSCULAR | Status: AC | PRN

## 2023-09-21 MED ORDER — ENSURE SURGERY PO LIQD
237.0000 mL | Freq: Two times a day (BID) | ORAL | Status: DC
  Administered 2023-09-23 – 2023-09-24 (×3): 237 mL via ORAL
  Filled 2023-09-21 (×2): qty 237

## 2023-09-21 MED ORDER — SUGAMMADEX SODIUM 200 MG/2ML IV SOLN
INTRAVENOUS | Status: DC | PRN
  Administered 2023-09-21: 100 mg via INTRAVENOUS

## 2023-09-21 MED ORDER — PROPOFOL 10 MG/ML IV BOLUS
INTRAVENOUS | Status: DC | PRN
  Administered 2023-09-21: 70 mg via INTRAVENOUS

## 2023-09-21 MED ORDER — CALCIUM POLYCARBOPHIL 625 MG PO TABS
625.0000 mg | ORAL_TABLET | Freq: Two times a day (BID) | ORAL | Status: DC
  Administered 2023-09-21 – 2023-09-25 (×6): 625 mg via ORAL
  Filled 2023-09-21 (×9): qty 1

## 2023-09-21 MED ORDER — CHLORHEXIDINE GLUCONATE CLOTH 2 % EX PADS
6.0000 | MEDICATED_PAD | Freq: Every day | CUTANEOUS | Status: DC
  Administered 2023-09-22 – 2023-09-25 (×4): 6 via TOPICAL

## 2023-09-21 MED ORDER — POLYVINYL ALCOHOL 1.4 % OP SOLN: 1.0000 [drp] | Freq: Four times a day (QID) | OPHTHALMIC | Status: DC | PRN

## 2023-09-21 MED ORDER — FENTANYL CITRATE PF 50 MCG/ML IJ SOSY: 25.0000 ug | PREFILLED_SYRINGE | INTRAMUSCULAR | Status: DC | PRN

## 2023-09-21 MED ORDER — MENTHOL 3 MG MT LOZG: 1.0000 | LOZENGE | OROMUCOSAL | Status: DC | PRN

## 2023-09-21 MED ORDER — METOPROLOL TARTRATE 5 MG/5ML IV SOLN
5.0000 mg | Freq: Four times a day (QID) | INTRAVENOUS | Status: DC | PRN
  Filled 2023-09-21: qty 5

## 2023-09-21 MED ORDER — BUPIVACAINE-EPINEPHRINE (PF) 0.25% -1:200000 IJ SOLN
INTRAMUSCULAR | Status: DC | PRN
  Administered 2023-09-21: 70 mL

## 2023-09-21 MED ORDER — MAGIC MOUTHWASH: 15.0000 mL | Freq: Four times a day (QID) | ORAL | Status: DC | PRN

## 2023-09-21 MED ORDER — VITAMIN D 25 MCG (1000 UNIT) PO TABS
1000.0000 [IU] | ORAL_TABLET | Freq: Every day | ORAL | Status: DC
  Administered 2023-09-21 – 2023-09-26 (×6): 1000 [IU] via ORAL
  Filled 2023-09-21 (×6): qty 1

## 2023-09-21 MED ORDER — 0.9 % SODIUM CHLORIDE (POUR BTL) OPTIME
TOPICAL | Status: DC | PRN
  Administered 2023-09-21: 1000 mL

## 2023-09-21 MED ORDER — LACTATED RINGERS IV SOLN: Freq: Three times a day (TID) | INTRAVENOUS | Status: AC | PRN

## 2023-09-21 MED ORDER — GABAPENTIN 100 MG PO CAPS
200.0000 mg | ORAL_CAPSULE | Freq: Every day | ORAL | Status: DC
  Administered 2023-09-21 – 2023-09-25 (×5): 200 mg via ORAL
  Filled 2023-09-21 (×5): qty 2

## 2023-09-21 MED ORDER — ENOXAPARIN SODIUM 40 MG/0.4ML IJ SOSY
40.0000 mg | PREFILLED_SYRINGE | INTRAMUSCULAR | Status: DC
  Administered 2023-09-22 – 2023-09-23 (×2): 40 mg via SUBCUTANEOUS
  Filled 2023-09-21 (×2): qty 0.4

## 2023-09-21 MED ORDER — DEXAMETHASONE SODIUM PHOSPHATE 10 MG/ML IJ SOLN
INTRAMUSCULAR | Status: AC
  Filled 2023-09-21: qty 1

## 2023-09-21 MED ORDER — METHOCARBAMOL 500 MG PO TABS
1000.0000 mg | ORAL_TABLET | Freq: Four times a day (QID) | ORAL | Status: DC | PRN
  Administered 2023-09-23: 500 mg via ORAL
  Filled 2023-09-21 (×2): qty 2

## 2023-09-21 MED ORDER — SODIUM CHLORIDE 0.9% FLUSH: 3.0000 mL | INTRAVENOUS | Status: DC | PRN

## 2023-09-21 MED ORDER — PROCHLORPERAZINE MALEATE 10 MG PO TABS: 10.0000 mg | ORAL_TABLET | Freq: Four times a day (QID) | ORAL | Status: DC | PRN

## 2023-09-21 MED ORDER — ACETAMINOPHEN 500 MG PO TABS
1000.0000 mg | ORAL_TABLET | Freq: Four times a day (QID) | ORAL | Status: DC
  Administered 2023-09-21 – 2023-09-26 (×13): 1000 mg via ORAL
  Filled 2023-09-21 (×15): qty 2

## 2023-09-21 MED ORDER — PHENOL 1.4 % MT LIQD: 2.0000 | OROMUCOSAL | Status: DC | PRN

## 2023-09-21 MED ORDER — CHLORHEXIDINE GLUCONATE 0.12 % MT SOLN
15.0000 mL | Freq: Once | OROMUCOSAL | Status: AC
  Administered 2023-09-21: 15 mL via OROMUCOSAL

## 2023-09-21 MED ORDER — ROCURONIUM BROMIDE 100 MG/10ML IV SOLN
INTRAVENOUS | Status: DC | PRN
  Administered 2023-09-21: 50 mg via INTRAVENOUS

## 2023-09-21 MED ORDER — ONDANSETRON HCL 4 MG/2ML IJ SOLN
INTRAMUSCULAR | Status: AC
  Administered 2023-09-21: 4 mg via INTRAVENOUS
  Filled 2023-09-21: qty 2

## 2023-09-21 MED ORDER — MELATONIN 3 MG PO TABS: 3.0000 mg | ORAL_TABLET | Freq: Every evening | ORAL | Status: DC | PRN

## 2023-09-21 MED ORDER — SIMETHICONE 40 MG/0.6ML PO SUSP: 80.0000 mg | Freq: Four times a day (QID) | ORAL | Status: DC | PRN

## 2023-09-21 MED ORDER — ALVIMOPAN 12 MG PO CAPS
12.0000 mg | ORAL_CAPSULE | Freq: Two times a day (BID) | ORAL | Status: DC
  Administered 2023-09-22: 12 mg via ORAL
  Filled 2023-09-21 (×2): qty 1

## 2023-09-21 MED ORDER — TRAMADOL HCL 50 MG PO TABS
50.0000 mg | ORAL_TABLET | Freq: Four times a day (QID) | ORAL | Status: DC | PRN
Start: 2023-09-21 — End: 2023-09-26
  Administered 2023-09-26: 100 mg via ORAL
  Filled 2023-09-21: qty 2

## 2023-09-21 MED ORDER — LIDOCAINE HCL (PF) 2 % IJ SOLN
INTRAMUSCULAR | Status: AC
  Filled 2023-09-21: qty 5

## 2023-09-21 MED ORDER — HYDRALAZINE HCL 20 MG/ML IJ SOLN
10.0000 mg | INTRAMUSCULAR | Status: DC | PRN
  Administered 2023-09-21: 10 mg via INTRAVENOUS
  Filled 2023-09-21: qty 1

## 2023-09-21 MED ORDER — PROCHLORPERAZINE EDISYLATE 10 MG/2ML IJ SOLN: 5.0000 mg | Freq: Four times a day (QID) | INTRAMUSCULAR | Status: DC | PRN

## 2023-09-21 MED ORDER — FENTANYL CITRATE PF 50 MCG/ML IJ SOSY
PREFILLED_SYRINGE | INTRAMUSCULAR | Status: AC
  Filled 2023-09-21: qty 1

## 2023-09-21 MED ORDER — ROCURONIUM BROMIDE 10 MG/ML (PF) SYRINGE
PREFILLED_SYRINGE | INTRAVENOUS | Status: AC
  Filled 2023-09-21: qty 10

## 2023-09-21 MED ORDER — PHENYLEPHRINE HCL (PRESSORS) 10 MG/ML IV SOLN
INTRAVENOUS | Status: DC | PRN
  Administered 2023-09-21: 80 ug via INTRAVENOUS
  Administered 2023-09-21: 100 ug via INTRAVENOUS
  Administered 2023-09-21: 80 ug via INTRAVENOUS

## 2023-09-21 MED ORDER — BUPIVACAINE LIPOSOME 1.3 % IJ SUSP
INTRAMUSCULAR | Status: AC
  Filled 2023-09-21: qty 20

## 2023-09-21 MED ORDER — LIDOCAINE 2% (20 MG/ML) 5 ML SYRINGE
INTRAMUSCULAR | Status: DC | PRN
  Administered 2023-09-21: 100 mg via INTRAVENOUS

## 2023-09-21 MED ORDER — SODIUM CHLORIDE 0.9 % IV SOLN: 250.0000 mL | INTRAVENOUS | Status: DC | PRN

## 2023-09-21 MED ORDER — SODIUM CHLORIDE 0.9 % IV SOLN
2.0000 g | Freq: Two times a day (BID) | INTRAVENOUS | Status: AC
  Administered 2023-09-21: 2 g via INTRAVENOUS
  Filled 2023-09-21: qty 2

## 2023-09-21 MED ORDER — LACTATED RINGERS IV SOLN: INTRAVENOUS | Status: DC

## 2023-09-21 MED ORDER — ALUM & MAG HYDROXIDE-SIMETH 200-200-20 MG/5ML PO SUSP
30.0000 mL | Freq: Four times a day (QID) | ORAL | Status: DC | PRN
  Administered 2023-09-22 – 2023-09-24 (×3): 30 mL via ORAL
  Filled 2023-09-21 (×4): qty 30

## 2023-09-21 MED ORDER — FENTANYL CITRATE (PF) 100 MCG/2ML IJ SOLN
INTRAMUSCULAR | Status: AC
  Filled 2023-09-21: qty 2

## 2023-09-21 MED ORDER — AMISULPRIDE (ANTIEMETIC) 5 MG/2ML IV SOLN
INTRAVENOUS | Status: AC
Start: 2023-09-21 — End: 2023-09-21
  Administered 2023-09-21: 10 mg via INTRAVENOUS
  Filled 2023-09-21: qty 4

## 2023-09-21 MED ORDER — HYDROMORPHONE HCL 1 MG/ML IJ SOLN: 0.5000 mg | INTRAMUSCULAR | Status: DC | PRN

## 2023-09-21 SURGICAL SUPPLY — 76 items
APPLIER CLIP 5 13 M/L LIGAMAX5 (MISCELLANEOUS) IMPLANT
APPLIER CLIP ROT 10 11.4 M/L (STAPLE) IMPLANT
BAG COUNTER SPONGE SURGICOUNT (BAG) IMPLANT
CABLE HIGH FREQUENCY MONO STRZ (ELECTRODE) ×1 IMPLANT
CELLS DAT CNTRL 66122 CELL SVR (MISCELLANEOUS) IMPLANT
CHLORAPREP W/TINT 26 (MISCELLANEOUS) ×1 IMPLANT
CLIP APPLIE 5 13 M/L LIGAMAX5 (MISCELLANEOUS) IMPLANT
CLIP APPLIE ROT 10 11.4 M/L (STAPLE) IMPLANT
COUNTER NEEDLE 1200 MAGNETIC (NEEDLE) ×1 IMPLANT
COVER MAYO STAND STRL (DRAPES) ×3 IMPLANT
COVER SURGICAL LIGHT HANDLE (MISCELLANEOUS) ×1 IMPLANT
DRAIN CHANNEL 19F RND (DRAIN) IMPLANT
DRAPE LAPAROSCOPIC ABDOMINAL (DRAPES) ×1 IMPLANT
DRAPE SURG IRRIG POUCH 19X23 (DRAPES) ×1 IMPLANT
DRSG OPSITE POSTOP 4X10 (GAUZE/BANDAGES/DRESSINGS) IMPLANT
DRSG OPSITE POSTOP 4X6 (GAUZE/BANDAGES/DRESSINGS) IMPLANT
DRSG OPSITE POSTOP 4X8 (GAUZE/BANDAGES/DRESSINGS) IMPLANT
DRSG TEGADERM 2-3/8X2-3/4 SM (GAUZE/BANDAGES/DRESSINGS) ×2 IMPLANT
DRSG TEGADERM 4X4.75 (GAUZE/BANDAGES/DRESSINGS) IMPLANT
ELECT REM PT RETURN 15FT ADLT (MISCELLANEOUS) ×1 IMPLANT
ENDOLOOP SUT PDS II 0 18 (SUTURE) IMPLANT
EVACUATOR SILICONE 100CC (DRAIN) IMPLANT
GAUZE SPONGE 2X2 8PLY STRL LF (GAUZE/BANDAGES/DRESSINGS) ×1 IMPLANT
GAUZE SPONGE 4X4 12PLY STRL (GAUZE/BANDAGES/DRESSINGS) ×1 IMPLANT
GLOVE ECLIPSE 8.0 STRL XLNG CF (GLOVE) ×2 IMPLANT
GLOVE INDICATOR 8.0 STRL GRN (GLOVE) ×2 IMPLANT
GOWN STRL REUS W/ TWL XL LVL3 (GOWN DISPOSABLE) ×5 IMPLANT
IRRIG SUCT STRYKERFLOW 2 WTIP (MISCELLANEOUS) ×1 IMPLANT
IRRIGATION SUCT STRKRFLW 2 WTP (MISCELLANEOUS) ×1 IMPLANT
KIT TURNOVER KIT A (KITS) IMPLANT
LEGGING LITHOTOMY PAIR STRL (DRAPES) IMPLANT
PACK COLON (CUSTOM PROCEDURE TRAY) ×1 IMPLANT
PAD POSITIONING PINK XL (MISCELLANEOUS) ×1 IMPLANT
PENCIL SMOKE EVACUATOR (MISCELLANEOUS) IMPLANT
PROTECTOR NERVE ULNAR (MISCELLANEOUS) IMPLANT
RELOAD STAPLE 60 2.6 WHT THN (STAPLE) IMPLANT
RELOAD STAPLE 60 3.6 BLU REG (STAPLE) IMPLANT
RELOAD STAPLER BLUE 60MM (STAPLE) ×2 IMPLANT
RELOAD STAPLER WHITE 60MM (STAPLE) ×1 IMPLANT
RETRACTOR WND ALEXIS 18 MED (MISCELLANEOUS) IMPLANT
RTRCTR WOUND ALEXIS 18CM MED (MISCELLANEOUS) IMPLANT
SCISSORS LAP 5X35 DISP (ENDOMECHANICALS) ×1 IMPLANT
SEALER TISSUE G2 STRG ARTC 35C (ENDOMECHANICALS) IMPLANT
SET TUBE SMOKE EVAC HIGH FLOW (TUBING) ×1 IMPLANT
SLEEVE ADV FIXATION 5X100MM (TROCAR) IMPLANT
SLEEVE Z-THREAD 5X100MM (TROCAR) IMPLANT
SPIKE FLUID TRANSFER (MISCELLANEOUS) ×1 IMPLANT
STAPLER ECHELON LONG 60 440 (INSTRUMENTS) IMPLANT
STAPLER RELOAD BLUE 60MM (STAPLE) ×2 IMPLANT
STAPLER RELOAD WHITE 60MM (STAPLE) ×1 IMPLANT
STAPLER SKIN PROX WIDE 3.9 (STAPLE) IMPLANT
SURGILUBE 2OZ TUBE FLIPTOP (MISCELLANEOUS) IMPLANT
SUT MNCRL AB 4-0 PS2 18 (SUTURE) ×1 IMPLANT
SUT PDS AB 1 CT1 27 (SUTURE) IMPLANT
SUT PDS AB 1 TP1 96 (SUTURE) IMPLANT
SUT PROLENE 0 CT 2 (SUTURE) IMPLANT
SUT PROLENE 2 0 SH DA (SUTURE) IMPLANT
SUT SILK 2 0 SH CR/8 (SUTURE) ×1 IMPLANT
SUT SILK 2-0 18XBRD TIE 12 (SUTURE) ×1 IMPLANT
SUT SILK 3 0 SH CR/8 (SUTURE) ×1 IMPLANT
SUT SILK 3-0 18XBRD TIE 12 (SUTURE) ×1 IMPLANT
SUT V-LOC BARB 180 2/0GR6 GS22 (SUTURE) ×2 IMPLANT
SUT VICRYL 0 UR6 27IN ABS (SUTURE) ×1 IMPLANT
SUTURE V-LC BRB 180 2/0GR6GS22 (SUTURE) IMPLANT
SYS LAPSCP GELPORT 120MM (MISCELLANEOUS) IMPLANT
SYS WOUND ALEXIS 18CM MED (MISCELLANEOUS) ×1 IMPLANT
SYSTEM LAPSCP GELPORT 120MM (MISCELLANEOUS) IMPLANT
SYSTEM WOUND ALEXIS 18CM MED (MISCELLANEOUS) IMPLANT
TAPE UMBILICAL 1/8 X36 TWILL (MISCELLANEOUS) ×1 IMPLANT
TOWEL OR 17X26 10 PK STRL BLUE (TOWEL DISPOSABLE) IMPLANT
TRAY FOLEY MTR SLVR 16FR STAT (SET/KITS/TRAYS/PACK) IMPLANT
TROCAR 11X100 Z THREAD (TROCAR) IMPLANT
TROCAR ADV FIXATION 12X100MM (TROCAR) IMPLANT
TROCAR ADV FIXATION 5X100MM (TROCAR) IMPLANT
TROCAR Z-THREAD OPTICAL 5X100M (TROCAR) ×1 IMPLANT
TUBING CONNECTING 10 (TUBING) IMPLANT

## 2023-09-21 NOTE — Progress Notes (Signed)
 PROGRESS NOTE  Mallory Stephens BJY:[id number] DOB: 1933/05/31 DOA: 09/18/2023 PCP: Mila Palmer, MD   LOS: 3 days   Brief Narrative / Interim history: 89 year old female with history of breast cancer, HTN, permanent A-fib on Eliquis comes into the hospital with anemia, abdominal discomfort as well as maroon stools.  She has a history of iron deficiency anemia for which she has been worked up by hematology without a clear cause.  Over the last couple of days prior to admission has noticed that her stools were maroon and has been having abdominal discomfort.  She discussed with her hematologist, and eventually presented to the ER with concerns for GI bleed.  CT scan on admission raised concern about an irregular mass in the cecum/ascending colon, concerning for colonic malignancy.  GI was consulted, she was transfused unit of packed red blood cells and was admitted to the hospital  Subjective / 24h Interval events: Seen in PACU, still waking up. Doesn't feel good, she is nauseous and actively vomiting  Assesement and Plan: Principal problem Lower GI bleed, acute blood loss anemia, iron deficiency anemia, and new diagnosis of colon cancer-patient was transfused a unit of packed red blood cells on 2/14 for hemoglobin of 7.3, improved appropriately.  Imaging on admission concerning for colonic malignancy in the cecum/ascending colon.  GI and general surgery consulted, she was taken to the OR 2/17 by Dr. Michaell Cowing, and is status post laparoscopic right colectomy with anastomosis -Monitor postoperatively closely, risk of ileus  Active problems Chronic diastolic CHF-she was recently admitted to the hospital and discharged in January 2075 for acute on chronic diastolic CHF, responded well to diuretics.  Has been taking furosemide since, and currently does not appreciate any significant fluid retention -Clinically euvolemic preoperatively, on room air, closely monitor postop  Permanent atrial  fibrillation-continue metoprolol, hold Eliquis in the setting of GI bleed  History of breast cancer-follows with oncology as an outpatient, continue anastrozole  Mood d/o -continue paroxetine  Hyperlipidemia-continue statin  Scheduled Meds:  sodium chloride   Intravenous Once   [MAR Hold] anastrozole  1 mg Oral q AM   [MAR Hold] famotidine  40 mg Oral QHS   [MAR Hold] fesoterodine  4 mg Oral Daily   [MAR Hold] furosemide  40 mg Oral Daily   [MAR Hold] metoprolol tartrate  25 mg Oral BID   [MAR Hold] PARoxetine  10 mg Oral BH-q7a   [MAR Hold] pravastatin  20 mg Oral QHS   Continuous Infusions:  lactated ringers 10 mL/hr at 09/21/23 0802   PRN Meds:.0.9 % irrigation (POUR BTL), [MAR Hold] acetaminophen **OR** [MAR Hold] acetaminophen, [MAR Hold] albuterol, bupivacaine-epinephrine (PF) (MARCAINE W/ EPI) 0.25% -1:200000 50 mL, bupivacaine liposome (EXPAREL) 1.3 % 20 mL, fentaNYL (SUBLIMAZE) injection, [MAR Hold] ondansetron **OR** [MAR Hold] ondansetron (ZOFRAN) IV  Current Outpatient Medications  Medication Instructions   acetaminophen (TYLENOL) 325 mg, Every 6 hours PRN   anastrozole (ARIMIDEX) 1 mg, Oral, Daily   cholecalciferol (VITAMIN D3) 1,000 Units, Oral, Daily   Eliquis 5 mg, Oral, 2 times daily   famotidine (PEPCID) 40 mg, Daily at bedtime   furosemide (LASIX) 40 mg, Oral, Daily   furosemide (LASIX) 20 mg, Oral, Every morning   ibandronate (BONIVA) 150 MG tablet TAKE 1 TABLET BY MOUTH EVERY 30 DAYS ON THE 10TH DAY OF EACH MONTH IN THE MORNING WITH A FULL GLASS OF WATER,ON AN EMPTY STOMACH AND DO NOT TAKE ANYTHING ELSE BY MOUTH OR LIE DOWN FOR THE NEXT 30 MIN  losartan (COZAAR) 25 mg, Oral, Daily   metoprolol tartrate (LOPRESSOR) 25 MG tablet TAKE 1 TABLET TWICE A DAY   PARoxetine (PAXIL) 10 mg, BH-each morning   polyvinyl alcohol (LIQUIFILM TEARS) 1.4 % ophthalmic solution 1-2 drops, Every 6 hours PRN   pravastatin (PRAVACHOL) 20 mg, Daily at bedtime   solifenacin  (VESICARE) 5 mg, Every morning    Diet Orders (From admission, onward)     Start     Ordered   09/21/23 0430  Diet NPO time specified Except for: [employer], Sips with Meds  Diet effective ____       Question Answer Comment  Except for Ice Chips   Except for Sips with Meds      09/20/23 1112            DVT prophylaxis: SCD's Start: 09/20/23 1113 SCDs Start: 09/18/23 1806   Lab Results  Component Value Date   PLT 304 09/21/2023      Code Status: Limited: Do not attempt resuscitation (DNR) -DNR-LIMITED -Do Not Intubate/DNI   Family Communication: niece at bedside   Status is: Inpatient Remains inpatient appropriate because: severity of illness  Level of care: Telemetry  Consultants:  General surgery Gastroenterology   Objective: Vitals:   09/21/23 1053 09/21/23 1055 09/21/23 1100 09/21/23 1115  BP: (!) 185/81 (!) 172/63 (!) 153/105 (!) 146/74  Pulse:  90 69   Resp: 14 15 19 15   Temp:   (!) 97.2 F (36.2 C) (!) 97.5 F (36.4 C)  TempSrc:      SpO2:  97% 95% 94%  Weight:      Height:        Intake/Output Summary (Last 24 hours) at 09/21/2023 1142 Last data filed at 09/21/2023 1045 Gross per 24 hour  Intake 2095 ml  Output 1900 ml  Net 195 ml   Wt Readings from Last 3 Encounters:  09/18/23 56.7 kg  08/17/23 55.3 kg  06/18/23 60.9 kg    Examination:  Constitutional: Sleepy, seen in PACU Eyes: lids and conjunctivae normal, no scleral icterus ENMT: mmm Neck: normal, supple Respiratory: Diminished at the bases, shallow respirations.  No wheezing or crackles heard Cardiovascular: Regular rate and rhythm, no murmurs / rubs / gallops. No LE edema. Abdomen: Deferred, postop Neurologic: Does not fully participate but no focal deficits   Data Reviewed: I have independently reviewed following labs and imaging studies   CBC Recent Labs  Lab 09/18/23 1039 09/18/23 1832 09/19/23 0154 09/19/23 1754 09/20/23 0559 09/21/23 0632  WBC 9.4  --   --   --   8.8 9.7  HGB 7.3* 7.1* 8.2* 8.1* 8.0* 9.4*  HCT 25.0* 26.4* 27.6* 28.2* 27.8* 33.4*  PLT 369  --   --   --  289 304  MCV 91.6  --   --   --  93.6 95.7  MCH 26.7  --   --   --  26.9 26.9  MCHC 29.2*  --   --   --  28.8* 28.1*  RDW 18.3*  --   --   --  18.0* 17.6*    Recent Labs  Lab 09/18/23 1039 09/19/23 0154 09/20/23 0559 09/21/23 0632  NA 141 140 138 137  K 3.9 4.0 3.4* 3.3*  CL 104 106 104 103  CO2 29 26 25 23   GLUCOSE 97 102* 91 119*  BUN 15 15 14 12   CREATININE 0.81 0.76 0.81 0.94  CALCIUM 9.5 9.0 8.7* 8.9  AST 19  --  11*  --   ALT 5  --  7  --   ALKPHOS 46  --  45  --   BILITOT 0.7  --  1.6*  --   ALBUMIN 3.3*  --  2.7*  --   MG  --   --  2.0 1.9    ------------------------------------------------------------------------------------------------------------------ No results for input(s): "CHOL", "HDL", "LDLCALC", "TRIG", "CHOLHDL", "LDLDIRECT" in the last 72 hours.  No results found for: "HGBA1C" ------------------------------------------------------------------------------------------------------------------ No results for input(s): "TSH", "T4TOTAL", "T3FREE", "THYROIDAB" in the last 72 hours.  Invalid input(s): "FREET3"  Cardiac Enzymes No results for input(s): "CKMB", "TROPONINI", "MYOGLOBIN" in the last 168 hours.  Invalid input(s): "CK" ------------------------------------------------------------------------------------------------------------------    Component Value Date/Time   BNP 897.0 (H) 08/13/2023 1146    CBG: No results for input(s): "GLUCAP" in the last 168 hours.  Recent Results (from the past 240 hours)  Surgical pcr screen     Status: None   Collection Time: 09/20/23  5:59 PM   Specimen: Nasal Mucosa; Nasal Swab  Result Value Ref Range Status   MRSA, PCR NEGATIVE NEGATIVE Final   Staphylococcus aureus NEGATIVE NEGATIVE Final    Comment: (NOTE) The Xpert SA Assay (FDA approved for NASAL specimens in patients 73 years of age and  older), is one component of a comprehensive surveillance program. It is not intended to diagnose infection nor to guide or monitor treatment. Performed at [facility], [address] [place], [place] [address]      Radiology Studies: No results found.    Pamella Pert, MD, PhD [facility]  Between 7 am - 7 pm I am available, please contact me via Amion (for emergencies) or Securechat (non urgent messages)  Between 7 pm - 7 am I am not available, please contact night coverage MD/APP via Amion

## 2023-09-21 NOTE — Anesthesia Procedure Notes (Signed)
 Central Venous Catheter Insertion Performed by: Collene Schlichter, MD, anesthesiologist Start/End2/17/2025 8:15 AM, 09/21/2023 8:25 AM Preanesthetic checklist: patient identified, IV checked, site marked, risks and benefits discussed, surgical consent, monitors and equipment checked, pre-op evaluation, timeout performed and anesthesia consent Position: Trendelenburg Lidocaine 1% used for infiltration and patient sedated Hand hygiene performed , maximum sterile barriers used  and Seldinger technique used Catheter size: 8 Fr Total catheter length 16. Central line was placed.Double lumen Procedure performed using ultrasound guided technique. Ultrasound Notes:anatomy identified, needle tip was noted to be adjacent to the nerve/plexus identified, no ultrasound evidence of intravascular and/or intraneural injection and image(s) printed for medical record Attempts: 1 Following insertion, line sutured, dressing applied and Biopatch. Post procedure assessment: blood return through all ports, free fluid flow and no air  Patient tolerated the procedure well with no immediate complications.

## 2023-09-21 NOTE — Anesthesia Postprocedure Evaluation (Signed)
 Anesthesia Post Note  Patient: Mallory Stephens  Procedure(s) Performed: LAPAROSCOPIC PARTIAL COLECTOMY     Patient location during evaluation: PACU Anesthesia Type: General Level of consciousness: awake and alert Pain management: pain level controlled Vital Signs Assessment: post-procedure vital signs reviewed and stable Respiratory status: spontaneous breathing, nonlabored ventilation, respiratory function stable and patient connected to nasal cannula oxygen Cardiovascular status: blood pressure returned to baseline and stable Postop Assessment: no apparent nausea or vomiting Anesthetic complications: no   No notable events documented.  Last Vitals:  Vitals:   09/21/23 1130 09/21/23 1145  BP: (!) 141/77 (!) 153/72  Pulse: 69 90  Resp: 14 14  Temp: 36.4 C   SpO2: 95% 92%    Last Pain:  Vitals:   09/21/23 1145  TempSrc:   PainSc: 3                  Collene Schlichter

## 2023-09-21 NOTE — Anesthesia Procedure Notes (Signed)
 Procedure Name: Intubation Date/Time: 09/21/2023 8:13 AM  Performed by: Ovidio Kin, CRNAPre-anesthesia Checklist: Patient identified, Emergency Drugs available, Suction available and Patient being monitored Patient Re-evaluated:Patient Re-evaluated prior to induction Oxygen Delivery Method: Circle system utilized Preoxygenation: Pre-oxygenation with 100% oxygen Induction Type: IV induction Ventilation: Mask ventilation without difficulty Laryngoscope Size: Mac and 3 Grade View: Grade I Tube type: Oral Tube size: 7.0 mm Number of attempts: 1 Placement Confirmation: ETT inserted through vocal cords under direct vision, positive ETCO2 and breath sounds checked- equal and bilateral Secured at: 23 cm Tube secured with: Tape Dental Injury: Teeth and Oropharynx as per pre-operative assessment

## 2023-09-21 NOTE — Anesthesia Preprocedure Evaluation (Addendum)
 Anesthesia Evaluation  Patient identified by MRN, date of birth, ID band Patient awake    Reviewed: Allergy & Precautions, NPO status , Patient's Chart, lab work & pertinent test results, reviewed documented beta blocker date and time   History of Anesthesia Complications (+) PONV and history of anesthetic complications  Airway Mallampati: II  TM Distance: >3 FB Neck ROM: Full    Dental  (+) Teeth Intact, Dental Advisory Given   Pulmonary former smoker   Pulmonary exam normal breath sounds clear to auscultation       Cardiovascular hypertension, Pt. on home beta blockers and Pt. on medications +CHF  Normal cardiovascular exam+ dysrhythmias Atrial Fibrillation  Rhythm:Regular Rate:Normal     Neuro/Psych negative neurological ROS     GI/Hepatic Neg liver ROS, PUD,GERD  ,,COLONIC MASS; GI BLEED   Endo/Other  negative endocrine ROS    Renal/GU negative Renal ROS     Musculoskeletal  (+) Arthritis ,    Abdominal   Peds  Hematology  (+) Blood dyscrasia (Eliquis), anemia   Anesthesia Other Findings   Reproductive/Obstetrics                             Anesthesia Physical Anesthesia Plan  ASA: 3  Anesthesia Plan: General   Post-op Pain Management: Ofirmev IV (intra-op)*   Induction: Intravenous  PONV Risk Score and Plan: 4 or greater and Dexamethasone and Ondansetron  Airway Management Planned: Oral ETT  Additional Equipment: Arterial line, CVP and Ultrasound Guidance Line Placement  Intra-op Plan:   Post-operative Plan: Possible Post-op intubation/ventilation  Informed Consent: I have reviewed the patients History and Physical, chart, labs and discussed the procedure including the risks, benefits and alternatives for the proposed anesthesia with the patient or authorized representative who has indicated his/her understanding and acceptance.     Dental advisory given  Plan  Discussed with: CRNA  Anesthesia Plan Comments: (2nd PIV vs CVL. Possible arterial line if necessary.)        Anesthesia Quick Evaluation

## 2023-09-21 NOTE — Plan of Care (Signed)

## 2023-09-21 NOTE — Op Note (Signed)
 09/21/2023  10:31 AM  PATIENT:  Mallory Stephens  89 y.o. female  Patient Care Team: Mila Palmer, MD as PCP - General (Family Medicine) Quintella Reichert, MD as PCP - Cardiology (Cardiology) Dorothy Puffer, MD as Consulting Physician (Radiation Oncology) Almond Lint, MD as Consulting Physician (General Surgery) Quintella Reichert, MD as Consulting Physician (Cardiology) Shea Evans, MD as Consulting Physician (Obstetrics and Gynecology) Cherlyn Roberts, MD as Referring Physician (Dermatology)  PRE-OPERATIVE DIAGNOSIS:  COLONIC MASS; GI BLEED  POST-OPERATIVE DIAGNOSIS:   CECAL COLONIC MASS WITH BLEEDING  PROCEDURE:  LAPAROSCOPIC, PROXIMAL "RIGHT" COLECTOMY WITH ANASTOMOSIS, and TRANSVERSUS ABDOMINIS PLANE (TAP) BLOCK - BILATERAL  SURGEON:  Ardeth Sportsman, MD  ASSISTANT:  Reine Just, PA-C  An experienced assistant was required given the standard of surgical care given the complexity of the case.  This assistant was needed for exposure, dissection, suction, tissue approximation, retraction, perception, etc  ANESTHESIA:  General endotracheal intubation anesthesia (GETA) and Regional TRANSVERSUS ABDOMINIS PLANE (TAP) nerve block -BILATERAL for perioperative & postoperative pain control at the level of the transverse abdominis & preperitoneal spaces along the flank at the anterior axillary line, from subcostal ridge to iliac crest under laparoscopic guidance provided with liposomal bupivacaine (Experel) 20mL mixed with 50 mL of bupivicaine 0.25% with epinephrine  Estimated Blood Loss (EBL):   Total I/O In: 315 [Blood:315] Out: 300 [Urine:200; Blood:100].   (See anesthesia record)  Delay start of Pharmacological VTE agent (>24hrs) due to concerns of significant anemia, surgical blood loss, or risk of bleeding?:  no  DRAINS: (None)  SPECIMEN:  Colon - proximal right  DISPOSITION OF SPECIMEN:  Pathology  COUNTS:  Sponge, needle, & instrument counts CORRECT  PLAN OF CARE:  Admit to inpatient   PATIENT DISPOSITION:  PACU - hemodynamically stable.  INDICATION:    Elderly woman chronically anticoagulated was found to have GI bleeding and anemia.  Admitted and transfused.  CAT scan revealed bulky mass in cecum suspicious for malignancy.  Gastroenterology was consulted.  They declined doing colonoscopy and recommended surgery.  Surgery was consulted.  I recommended segmental resection:  The anatomy & physiology of the digestive tract was discussed.  The pathophysiology was discussed.  Natural history risks without surgery was discussed.   I worked to give an overview of the disease and the frequent need to have multispecialty involvement.  I feel the risks of no intervention will lead to serious problems that outweigh the operative risks; therefore, I recommended a partial colectomy to remove the pathology.  Laparoscopic & open techniques were discussed.   Risks such as bleeding, infection, abscess, leak, reoperation, possible ostomy, hernia, heart attack, death, and other risks were discussed.  I noted a good likelihood this will help address the problem.   Goals of post-operative recovery were discussed as well.  We will work to minimize complications.  An educational handout on the pathology was given as well.  Questions were answered.    The patient expresses understanding & wishes to proceed with surgery.  OR FINDINGS:   Patient had very bulky mass involving cecum and part of ascending colon highly suspicious for malignancy.  No obvious metastatic disease on visceral parietal peritoneum or liver.  It is an isoperistaltic ileocolonic anastomosis that rests in the periumbilical region.  CASE DATA: Type of patient?: LDOW CASE (Surgical Hospitalist [facility] Inpatient) Status of Case? URGENT Add On Infection Present At Time Of Surgery (PATOS)?  NO     DESCRIPTION:   Informed consent was confirmed.  The patient underwent general anaesthesia without difficulty.  The  patient was positioned with arms tucked & secured appropriately.  VTE prevention in place.  The patient's abdomen was clipped, prepped, & draped in a sterile fashion.  Patient was given a unit of blood at the start the case per anesthesia given starting hemoglobin around 8.  Anesthesia also placed a central line in the right internal jugular vein.  Please see their separate note surgical timeout confirmed our plan.  The patient was positioned in reverse Trendelenburg.  Abdominal entry was gained using Varess technique at the  subcostal ridge on the anterior abdominal wall.  No elevated EtCO2 noted.  Port placed.  Camera inspection revealed no injury.  Extra ports were carefully placed under direct laparoscopic visualization.  We docked the [employer] Vinci robot carefully and placed intstruments under visualization.  Confirmed there is a bulky mass in the ileocecal region with no obvious carcinomatosis and or perforation.  Decided to proceed with minimally base of resection to start  Annitta Needs off moderately dense omental adhesions to the pelvis and with that WE could I mobilize & reflected the greater omentum in the upper abdomen.  Position the small bowel into the left lower quadrant and pelvis.  We were able to elevate the proximal colon to isolate the ileocolonic pedicle.  I scored the ileal mesentery just proximal to that.   I carried that further dissection in a medial to lateral fashion.  I was able to bluntly get into the retro-mesenteric plane on the right side.  I freed the proximal right sided colonic mesentery off the retroperitoneum including the duodenal sweep, pancreatic head, & Gerota's fascia of the right kidney. I was able to get underneath the hepatic flexure.  I was able to get underneath the proximal and mid transverse colon.  I isolated the proximal ileocecal pedicle.  I skeletonized it & transected the vessels.    I then proceeded to mobilize the terminal ileum & proximal "right" colon in a  lateral to medial fashion.  I mobilized the distal ileal mesentery off its retroperitoneal and pelvic attachments.  I mobilized the ascending colon off It is side wall attachments to the paracolic gutter and retroperitoneum.  I also mobilized the greater omentum off the mid transverse colon and mobilized the mid to proximal transverse colon in a superior to inferior fashion.  This allowed me to mobilize the hepatic flexure and get a complete mobilization of the proximal "right" colon to the mid-transverse colon.   I could isolate the pathology.   Chose an area in the distal ileum and transected the mesentery radially.  We chose an area in the transverse colon just proximal to a dominant middle colic artery pedicle and transected that in a radial fashion to good location.  We confirmed good viability of the ileum and transverse colon plan for anastomosis. When he went ahead and proceeded with transection.  We transected at the distal ileum with a lap stapler 60mm blue load.  We then transected transverse colon with another load stapler 60mm blue load.  We confirmed hemostasis.     I did a side-to-side stapled anastomosis of ileum to mid-transverse colon using a 60mm white load in an isoperistaltic fashion.  (Distal stump of ileum to mid transverse colon for the distal end of the anastomosis.  Proximal end of colon stump to more proximal ileum for the proximal end of the anastomosis).  I sewed the common staple channel wound with an absorbable suture (V-lock) in  a running Fl[place]illa fashion from each corner and meeting in the center.  We did meticulous inspection prove an airtight closure.  I protected the anastomosis line with an anterior omentopexy of greater omentum using V lock suture.    We did reinspection of the abdomen.  Hemostasis was good.   Ureters, retroperitoneum, and bowel uninjured.  The anastomosis looked healthy.   Endoluminal gas was evacuated.  We placed the wound protector through the suprapubic  12mm port site after it was enlarged in a Pfannenstiel fashion.  Specimen removed without incident.   Ports & wound protector removed.  Hemostasis was good.  Sterile unused instruments were used from this point.  I closed the skin at the port sites using Monocryl stitch and sterile dressing.  I closed the extraction wound using a 0 Vicryl vertical peritoneal closure and a #1 PDS transverse anterior rectal fascial closure like a small Pfannenstiel closure. I closed the skin with some interrupted Monocryl stitches.  I placed sterile dressings.     Patient is being extubated go to recovery room. I discussed postop care with the patient in detail the office & in the holding area. Instructions are written. I discussed operative findings, updated the patient's status, discussed probable steps to recovery, and gave postoperative recommendations to the patient's niece, Darcus Austin .  Recommendations were made.  Questions were answered.  She expressed understanding & appreciation.  Ardeth Sportsman, M.D., F.A.C.S. Gastrointestinal and Minimally Invasive Surgery [facility] [address], [address] [place], [place] [address] [phone] Main / Paging

## 2023-09-21 NOTE — Transfer of Care (Signed)
 Immediate Anesthesia Transfer of Care Note  Patient: Mallory Stephens  Procedure(s) Performed: LAPAROSCOPIC PARTIAL COLECTOMY  Patient Location: PACU  Anesthesia Type:General  Level of Consciousness: alert   Airway & Oxygen Therapy: Patient Spontanous Breathing and Patient connected to face mask oxygen  Post-op Assessment: Report given to RN and Post -op Vital signs reviewed and stable  Post vital signs: Reviewed and stable  Last Vitals:  Vitals Value Taken Time  BP 136/86 09/21/23 1042  Temp    Pulse 54 09/21/23 1045  Resp 16 09/21/23 1045  SpO2 90 % 09/21/23 1045  Vitals shown include unfiled device data.  Last Pain:  Vitals:   09/21/23 0700  TempSrc: Oral  PainSc:          Complications: No notable events documented.

## 2023-09-21 NOTE — Interval H&P Note (Signed)
 History and Physical Interval Note:  09/21/2023 7:36 AM  Mallory Stephens  has presented today for surgery, with the diagnosis of COLONIC MASS; GI BLEED.  The various methods of treatment have been discussed with the patient and family. After consideration of risks, benefits and other options for treatment, the patient has consented to  Procedure(s): LAPAROSCOPIC PARTIAL COLECTOMY (N/A) as a surgical intervention.  The patient's history has been reviewed, patient examined, no change in status, stable for surgery.  I have reviewed the patient's chart and labs.  Questions were answered to the patient's satisfaction.    I have re-reviewed the the patient's records, history, medications, and allergies.  I have re-examined the patient.  I again discussed intraoperative plans and goals of post-operative recovery.  The patient agrees to proceed.  Mallory Stephens  1932-11-26 [id number]  Patient Care Team: Mila Palmer, MD as PCP - General (Family Medicine) Quintella Reichert, MD as PCP - Cardiology (Cardiology) Dorothy Puffer, MD as Consulting Physician (Radiation Oncology) Almond Lint, MD as Consulting Physician (General Surgery) Quintella Reichert, MD as Consulting Physician (Cardiology) Shea Evans, MD as Consulting Physician (Obstetrics and Gynecology) Cherlyn Roberts, MD as Referring Physician (Dermatology)  Patient Active Problem List   Diagnosis Date Noted   Lower GI bleeding 09/18/2023   Heart failure (HCC) 08/14/2023   Acute CHF (congestive heart failure) (HCC) 08/13/2023   Breast cancer (HCC) 10/25/2022   Permanent atrial fibrillation (HCC) 10/25/2022   Essential hypertension 10/25/2022   Hyperlipidemia 10/25/2022   Hypokalemia 10/25/2022   Chronic anemia 10/25/2022   Acute respiratory failure with hypoxia secondary to CAP vs/+ new CHF exacerbation 10/25/2022   Acute on chronic diastolic CHF (congestive heart failure) (HCC) 10/25/2022   CAP (community acquired pneumonia)  10/25/2022   Genetic testing 11/26/2020   History of basal cell carcinoma    Family history of breast cancer    Family history of prostate cancer    Malignant neoplasm of upper-inner quadrant of left breast in female, 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/2020   Hypokalemia 04/29/2017   Leukocytosis 04/29/2017   Syncope 04/29/2017   UTI (urinary tract infection) 04/28/2017   Fall 04/28/2017   Nausea and vomiting 04/28/2017   Generalized weakness 04/28/2017   Constipation 04/28/2017   Diverticulitis    Edema of extremities 09/03/2016   Dyslipidemia    Permanent atrial fibrillation (HCC) 07/08/2013   Essential hypertension, benign 07/08/2013   Long term (current) use of anticoagulants 07/08/2013   Encounter for long-term (current) use of other medications 07/08/2013    Past Medical History:  Diagnosis Date   Breast cancer (HCC) 07/24/2020   Dyslipidemia    Dysrhythmia    atrial fibrillation   Edema extremities 09/03/2016   Family history of breast cancer    Family history of prostate cancer    Fatty liver    GERD (gastroesophageal reflux disease)    History of basal cell carcinoma    Hypertension    Lymphocytosis    followed by Dr. Arbutus Ped   Multinodular goiter    Osteoarthritis    Permanent atrial fibrillation (HCC)    atrial fibrillation s/p DCCV 3/10 and 6/10   PONV (postoperative nausea and vomiting)    PUD (peptic ulcer disease)    Pulmonary nodules    followed  by Dr. Paulino Rily   UTI (urinary tract infection) 04/2017    Past Surgical History:  Procedure Laterality Date   aspiration of cyst  1978   BREAST LUMPECTOMY Left 09/2020   BREAST  LUMPECTOMY WITH RADIOACTIVE SEED LOCALIZATION Left 09/06/2020   Procedure: LEFT BREAST LUMPECTOMY WITH RADIOACTIVE SEED LOCALIZATION;  Surgeon: Almond Lint, MD;  Location: [facility] OR;  Service: General;  Laterality: Left;   BREAST SURGERY  1962   right breast for benign lesion   BREAST SURGERY  1971   bilateral lumpectomy for  beingn lesions   CARDIOVERSION  07/22/2011   Procedure: CARDIOVERSION;  Surgeon: Quintella Reichert, MD;  Location: [facility] OR;  Service: Cardiovascular;  Laterality: N/A;   CARDIOVERSION  09/02/2011   Procedure: CARDIOVERSION;  Surgeon: Quintella Reichert, MD;  Location: [facility] OR;  Service: Cardiovascular;  Laterality: N/A;   cataract     cataract removal-bilateral   CHOLECYSTECTOMY  2003   CYSTOSCOPY     DILATION AND CURETTAGE OF UTERUS  1979   JOINT REPLACEMENT  2002   Right knee   JOINT REPLACEMENT  2007   left knee    Social History   Socioeconomic History   Marital status: Widowed    Spouse name: Not on file   Number of children: Not on file   Years of education: Not on file   Highest education level: Not on file  Occupational History   Not on file  Tobacco Use   Smoking status: Former    Current packs/day: 1.00    Average packs/day: 1 pack/day for 30.0 years (30.0 ttl pk-yrs)    Types: Cigarettes   Smokeless tobacco: Never  Vaping Use   Vaping status: Never Used  Substance and Sexual Activity   Alcohol use: Yes    Comment: occasional wine   Drug use: No   Sexual activity: Not on file  Other Topics Concern   Not on file  Social History Narrative   ** Merged History Encounter **       Social Drivers of Health   Financial Resource Strain: Not on file  Food Insecurity: No Food Insecurity (09/18/2023)   Hunger Vital Sign    Worried About Running Out of Food in the Last Year: Never true    Ran Out of Food in the Last Year: Never true  Transportation Needs: No Transportation Needs (09/18/2023)   PRAPARE - Administrator, Civil Service (Medical): No    Lack of Transportation (Non-Medical): No  Physical Activity: Not on file  Stress: Not on file  Social Connections: Socially Isolated (09/18/2023)   Social Connection and Isolation Panel [NHANES]    Frequency of Communication with Friends and Family: More than three times a week    Frequency of Social Gatherings with  Friends and Family: More than three times a week    Attends Religious Services: Never    Database administrator or Organizations: No    Attends Banker Meetings: Never    Marital Status: Widowed  Intimate Partner Violence: Not At Risk (09/18/2023)   Humiliation, Afraid, Rape, and Kick questionnaire    Fear of Current or Ex-Partner: No    Emotionally Abused: No    Physically Abused: No    Sexually Abused: No    Family History  Problem Relation Age of Onset   Stroke Mother    Stroke Father    Breast cancer Sister        dx 32s   Arthritis Sister    Scoliosis Sister    Heart disease Brother    Prostate cancer Brother        dx >50, metastatic   Prostate cancer Brother  dx >50, metastatic   Prostate cancer Nephew        dx <50   Breast cancer Niece        dx <50   Breast cancer Niece        dx <50   Autoimmune disease Neg Hx     Medications Prior to Admission  Medication Sig Dispense Refill Last Dose/Taking   acetaminophen (TYLENOL) 325 MG tablet Take 325 mg by mouth every 6 (six) hours as needed for moderate pain.   Taking As Needed   anastrozole (ARIMIDEX) 1 MG tablet TAKE 1 TABLET DAILY (Patient taking differently: Take 1 mg by mouth in the morning.) 90 tablet 3 09/17/2023 Morning   cholecalciferol (VITAMIN D3) 25 MCG (1000 UNIT) tablet Take 1 tablet (1,000 Units total) by mouth daily. 90 tablet 4 09/17/2023   ELIQUIS 5 MG TABS tablet TAKE 1 TABLET TWICE A DAY 180 tablet 1 09/17/2023 at 10:00 AM   famotidine (PEPCID) 40 MG tablet Take 40 mg by mouth at bedtime.   09/16/2023   furosemide (LASIX) 20 MG tablet Take 20 mg by mouth in the morning.   09/17/2023 Morning   ibandronate (BONIVA) 150 MG tablet TAKE 1 TABLET BY MOUTH EVERY 30 DAYS ON THE 10TH DAY OF EACH MONTH IN THE MORNING WITH A FULL GLASS OF WATER,ON AN EMPTY STOMACH AND DO NOT TAKE ANYTHING ELSE BY MOUTH OR LIE DOWN FOR THE NEXT 30 MIN (Patient taking differently: Take 150 mg by mouth every 30 (thirty)  days.) 3 tablet 4 Past Month   losartan (COZAAR) 25 MG tablet Take 1 tablet (25 mg total) by mouth daily. 30 tablet 11 09/17/2023 Morning   metoprolol tartrate (LOPRESSOR) 25 MG tablet TAKE 1 TABLET TWICE A DAY 180 tablet 3 09/17/2023 at 10:00 AM   PARoxetine (PAXIL) 10 MG tablet Take 10 mg by mouth every morning.   09/17/2023 Morning   polyvinyl alcohol (LIQUIFILM TEARS) 1.4 % ophthalmic solution Place 1-2 drops into both eyes every 6 (six) hours as needed for dry eyes.   Past Week   pravastatin (PRAVACHOL) 20 MG tablet Take 20 mg by mouth at bedtime.   09/16/2023   solifenacin (VESICARE) 5 MG tablet Take 5 mg by mouth in the morning.   09/17/2023 Morning   furosemide (LASIX) 40 MG tablet Take 1 tablet (40 mg total) by mouth daily. (Patient not taking: Reported on 09/18/2023) 30 tablet 0 Not Taking    Current Facility-Administered Medications  Medication Dose Route Frequency Provider Last Rate Last Admin   0.9 %  sodium chloride infusion (Manually program via Guardrails IV Fluids)   Intravenous Once Collene Schlichter, MD       [facility] Hold] acetaminophen (TYLENOL) tablet 650 mg  650 mg Oral Q6H PRN Kirby Crigler, Mir M, MD       Or   [facility] Hold] acetaminophen (TYLENOL) suppository 650 mg  650 mg Rectal Q6H PRN Kirby Crigler, Mir M, MD       [MAR Hold] albuterol (PROVENTIL) (2.5 MG/3ML) 0.083% nebulizer solution 2.5 mg  2.5 mg Nebulization Q2H PRN Kirby Crigler, Mir M, MD       [facility] Hold] anastrozole (ARIMIDEX) tablet 1 mg  1 mg Oral q AM Kirby Crigler, Mir M, MD   1 mg at 09/20/23 1118   cefoTEtan (CEFOTAN) 2 g in sodium chloride 0.9 % 100 mL IVPB  2 g Intravenous On Call to OR Gaynelle Adu, MD       [facility] Hold] famotidine (PEPCID) tablet 40 mg  40 mg Oral QHS Kirby Crigler, Mir M, MD   40 mg at 09/20/23 2145   [facility] Hold] fesoterodine (TOVIAZ) tablet 4 mg  4 mg Oral Daily Kirby Crigler, Mir M, MD   4 mg at 09/20/23 1117   [MAR Hold] furosemide (LASIX) tablet 40 mg  40 mg Oral Daily Leatha Gilding, MD   40 mg at 09/20/23  1118   lactated ringers infusion   Intravenous Continuous Collene Schlichter, MD 10 mL/hr at 09/21/23 0732 New Bag at 09/21/23 0732   [MAR Hold] metoprolol tartrate (LOPRESSOR) tablet 25 mg  25 mg Oral BID Kirby Crigler, Mir M, MD   25 mg at 09/21/23 0715   [MAR Hold] ondansetron (ZOFRAN) tablet 4 mg  4 mg Oral Q6H PRN Maryln Gottron, MD       Or   Mitzi Hansen Hold] ondansetron [facility]) injection 4 mg  4 mg Intravenous Q6H PRN Kirby Crigler, Mir M, MD   4 mg at 09/21/23 0053   [MAR Hold] PARoxetine (PAXIL) tablet 10 mg  10 mg Oral Lamar Laundry, Mir M, MD   10 mg at 09/20/23 1118   [MAR Hold] pravastatin (PRAVACHOL) tablet 20 mg  20 mg Oral QHS Kirby Crigler, Mir M, MD   20 mg at 09/20/23 2146   Facility-Administered Medications Ordered in Other Encounters  Medication Dose Route Frequency Provider Last Rate Last Admin   0.9 %  sodium chloride infusion   Intravenous Continuous Rachel Moulds, MD 10 mL/hr at 08/12/23 1427 New Bag at 08/12/23 1427     Allergies  Allergen Reactions   Sulfa Antibiotics Rash   Sulfa Drugs Cross Reactors Rash    BP (!) 145/80   Pulse 71   Temp 98.5 F (36.9 C) (Oral)   Resp 18   Ht 5\' 5"  (1.651 m)   Wt 56.7 kg   SpO2 98%   BMI 20.80 kg/m   Labs: Results for orders placed or performed during the hospital encounter of 09/18/23 (from the past 48 hours)  Hemoglobin and hematocrit, blood     Status: Abnormal   Collection Time: 09/19/23  5:54 PM  Result Value Ref Range   Hemoglobin 8.1 (L) 12.0 - 15.0 g/dL   HCT 82.9 (L) 56.2 - 13.0 %    Comment: Performed at [facility], [address], [place], [place] [address]  Comprehensive metabolic panel     Status: Abnormal   Collection Time: 09/20/23  5:59 AM  Result Value Ref Range   Sodium 138 135 - 145 mmol/L   Potassium 3.4 (L) 3.5 - 5.1 mmol/L   Chloride 104 98 - 111 mmol/L   CO2 25 22 - 32 mmol/L   Glucose, Bld 91 70 - 99 mg/dL    Comment: Glucose reference range applies only to samples taken  after fasting for at least 8 hours.   BUN 14 8 - 23 mg/dL   Creatinine, Ser 4.69 0.44 - 1.00 mg/dL   Calcium 8.7 (L) 8.9 - 10.3 mg/dL   Total Protein 5.4 (L) 6.5 - 8.1 g/dL   Albumin 2.7 (L) 3.5 - 5.0 g/dL   AST 11 (L) 15 - 41 U/L   ALT 7 0 - 44 U/L   Alkaline Phosphatase 45 38 - 126 U/L   Total Bilirubin 1.6 (H) 0.0 - 1.2 mg/dL   GFR, Estimated >62 >95 mL/min    Comment: (NOTE) Calculated using the CKD-EPI Creatinine Equation (2021)    Anion gap 9 5 - 15    Comment:  Performed at [facility], [address] [place], [place] [address]  CBC     Status: Abnormal   Collection Time: 09/20/23  5:59 AM  Result Value Ref Range   WBC 8.8 4.0 - 10.5 K/uL   RBC 2.97 (L) 3.87 - 5.11 MIL/uL   Hemoglobin 8.0 (L) 12.0 - 15.0 g/dL   HCT 60.4 (L) 54.0 - 98.1 %   MCV 93.6 80.0 - 100.0 fL   MCH 26.9 26.0 - 34.0 pg   MCHC 28.8 (L) 30.0 - 36.0 g/dL   RDW 19.1 (H) 47.8 - 29.5 %   Platelets 289 150 - 400 K/uL   nRBC 0.0 0.0 - 0.2 %    Comment: Performed at [facility], [address], [place], [place] [address]  Magnesium     Status: None   Collection Time: 09/20/23  5:59 AM  Result Value Ref Range   Magnesium 2.0 1.7 - 2.4 mg/dL    Comment: Performed at [facility], [address] [place], [place] [address]  Surgical pcr screen     Status: None   Collection Time: 09/20/23  5:59 PM   Specimen: Nasal Mucosa; Nasal Swab  Result Value Ref Range   MRSA, PCR NEGATIVE NEGATIVE   Staphylococcus aureus NEGATIVE NEGATIVE    Comment: (NOTE) The Xpert SA Assay (FDA approved for NASAL specimens in patients 73 years of age and older), is one component of a comprehensive surveillance program. It is not intended to diagnose infection nor to guide or monitor treatment. Performed at [facility], [address], [place], [place] [address]     Imaging / Studies: DG Chest 2 View Result Date: 09/20/2023 CLINICAL DATA:  [id number]  Acute CHF (congestive heart failure) (HCC) 324401 EXAM: CHEST - 2 VIEW COMPARISON:  08/14/2023 FINDINGS: Cardiomegaly. Aortic atherosclerosis. Coarsened interstitial markings bilaterally. Calcified granulomas are present. Trace bilateral pleural effusions. No pneumothorax. Chronic compression fractures at T11 and L1. IMPRESSION: 1. Cardiomegaly with trace bilateral pleural effusions. 2. Coarsened interstitial markings bilaterally which may reflect bronchitic lung changes or mild edema. Electronically Signed   By: Duanne Guess D.O.   On: 09/20/2023 14:07   CT ANGIO GI BLEED Result Date: 09/18/2023 CLINICAL DATA:  Lower GI bleed (Ped 0-17y) lower GI bleeding, mild abd discomfort. Lower abdominal pain. Bloody stools. Low hemoglobin. EXAM: CTA ABDOMEN AND PELVIS WITHOUT AND WITH CONTRAST TECHNIQUE: Multidetector CT imaging of the abdomen and pelvis was performed using the standard protocol during bolus administration of intravenous contrast. Multiplanar reconstructed images and MIPs were obtained and reviewed to evaluate the vascular anatomy. RADIATION DOSE REDUCTION: This exam was performed according to the departmental dose-optimization program which includes automated exposure control, adjustment of the mA and/or kV according to patient size and/or use of iterative reconstruction technique. CONTRAST:  80mL OMNIPAQUE IOHEXOL 350 MG/ML SOLN COMPARISON:  CT scan abdomen and pelvis from 04/06/2022. FINDINGS: VASCULAR Aorta: Normal caliber aorta without aneurysm, dissection, vasculitis or significant stenosis. Celiac: Patent without evidence of aneurysm, dissection, vasculitis or significant stenosis. There is mild narrowing at the origin to calcified plaques. SMA: Patent without evidence of aneurysm, dissection, vasculitis or significant stenosis. There is mild-to-moderate narrowing at the origin due to calcified plaques. Renals: Both renal arteries are patent without evidence of aneurysm, dissection, vasculitis,  fibromuscular dysplasia or significant stenosis. IMA: Patent without evidence of aneurysm, dissection, vasculitis or significant stenosis. Inflow: Patent without evidence of aneurysm, dissection, vasculitis or significant stenosis. Proximal Outflow: Bilateral common  femoral and visualized portions of the superficial and profunda femoral arteries are patent without evidence of aneurysm, dissection, vasculitis or significant stenosis. Veins: No obvious venous abnormality within the limitations of this arterial phase study. Review of the MIP images confirms the above findings. NON-VASCULAR Lower chest: Redemonstration of peripheral subpleural reticulations in the visualized bilateral lung bases. However, there is superimposed smooth interlobular septal thickening and bilateral trace pleural effusions, concerning for congestive heart failure/pulmonary edema. There is a 1 cm sized calcified granuloma in the right lung lower lobe. The lung bases are otherwise clear. No pleural effusion. The heart is normal in size. No pericardial effusion. Hepatobiliary: The liver is normal in size. Non-cirrhotic configuration. No suspicious mass. No intrahepatic bile duct dilation. There is mild prominence of the extrahepatic bile duct, most likely due to post cholecystectomy status. Gallbladder is surgically absent. Pancreas: Unremarkable. No pancreatic ductal dilatation or surrounding inflammatory changes. Spleen: Within normal limits. No focal lesion. Adrenals/Urinary Tract: Adrenal glands are unremarkable. No suspicious renal mass. No hydronephrosis. No renal or ureteric calculi. Urinary bladder is under distended, precluding optimal assessment. However, no large mass or stones identified. No perivesical fat stranding. Stomach/Bowel: There is an approximately 8 cm long irregular heterogeneous markedly thick mass centered in the medial wall of the cecum/proximal ascending colon with extension outside the serosa, compatible with  colonic malignancy. The terminal ileum is also likely involved in the tumor. The appendix is unremarkable. There are few adjacent heterogeneous subcentimeter sized lymph node, favored to represent locoregional metastases. There are hyperattenuating areas in the lumen, which may represent blood products. However, no active extravasation of contrast noted. No disproportionate dilation of the small or large bowel loops. There are multiple diverticula throughout the colon without diverticulitis. There is also a diverticulum arising from 3/4 part of duodenum. Vascular/Lymphatic: Small amount of ascites noted in the dependent pelvis. No pneumoperitoneum. No abdominal or pelvic lymphadenopathy, by size criteria. No aneurysmal dilation of the major abdominal arteries. There are moderate peripheral atherosclerotic vascular calcifications of the aorta and its major branches. Reproductive: The uterus is unremarkable. No large adnexal mass. Other: There is a tiny fat containing umbilical hernia. The soft tissues and abdominal wall are otherwise unremarkable. Musculoskeletal: No suspicious osseous lesions. There are mild - moderate multilevel degenerative changes in the visualized spine. There is mild-to-moderate compression deformity of L1 vertebra and moderate compression deformity of T11 vertebrae, similar to prior study. IMPRESSION: 1. No active extravasation of contrast noted to suggest active bleeding. 2. There is an irregular mass centered in the cecum/ascending colon, as described above, compatible with colonic malignancy. There are hyperattenuating areas in the lumen, which may represent blood products. 3. No distant metastatic disease identified within the abdomen or pelvis. 4. Findings favor congestive heart failure/pulmonary edema, as described above. 5. Multiple other nonacute observations, as described above. Aortic Atherosclerosis (ICD10-I70.0). Electronically Signed   By: Jules Schick M.D.   On: 09/18/2023  14:12     .Ardeth Sportsman, M.D., F.A.C.S. Gastrointestinal and Minimally Invasive Surgery [facility] [address] [place], [place] [address] [phone] Main / Paging  09/21/2023 7:36 AM    Ardeth Sportsman

## 2023-09-21 NOTE — Discharge Instructions (Signed)
 SURGERY: POST OP INSTRUCTIONS (Surgery for small bowel obstruction, colon resection, etc)   ######################################################################  EAT Gradually transition to a high fiber diet with a fiber supplement over the next few days after discharge  WALK Walk an hour a day.  Control your pain to do that.    CONTROL PAIN Control pain so that you can walk, sleep, tolerate sneezing/coughing, go up/down stairs.  HAVE A BOWEL MOVEMENT DAILY Keep your bowels regular to avoid problems.  OK to try a laxative to override constipation.  OK to use an antidairrheal to slow down diarrhea.  Call if not better after 2 tries  CALL IF YOU HAVE PROBLEMS/CONCERNS Call if you are still struggling despite following these instructions. Call if you have concerns not answered by these instructions  ######################################################################   DIET Follow a light diet the first few days at home.  Start with a bland diet such as soups, liquids, starchy foods, low fat foods, etc.  If you feel full, bloated, or constipated, stay on a ful liquid or pureed/blenderized diet for a few days until you feel better and no longer constipated. Be sure to drink plenty of fluids every day to avoid getting dehydrated (feeling dizzy, not urinating, etc.). Gradually add a fiber supplement to your diet over the next week.  Gradually get back to a regular solid diet.  Avoid fast food or heavy meals the first week as you are more likely to get nauseated. It is expected for your digestive tract to need a few months to get back to normal.  It is common for your bowel movements and stools to be irregular.  You will have occasional bloating and cramping that should eventually fade away.  Until you are eating solid food normally, off all pain medications, and back to regular activities; your bowels will not be normal. Focus on eating a low-fat, high fiber diet the rest of your life  (See Getting to Good Bowel Health, below).  CARE of your INCISION or WOUND  It is good for closed incisions and even open wounds to be washed every day.  Shower every day.  Short baths are fine.  Wash the incisions and wounds clean with soap & water.    You may leave closed incisions open to air if it is dry.   You may cover the incision with clean gauze & replace it after your daily shower for comfort.  TEGADERM:  You have clear gauze band-aid dressings over your closed incision(s).  Remove the dressings 2 days after surgery = 2/19 Wednesday.    If you have an open wound with a wound vac, see wound vac care instructions.    ACTIVITIES as tolerated Start light daily activities --- self-care, walking, climbing stairs-- beginning the day after surgery.  Gradually increase activities as tolerated.  Control your pain to be active.  Stop when you are tired.  Ideally, walk several times a day, eventually an hour a day.   Most people are back to most day-to-day activities in a few weeks.  It takes 4-8 weeks to get back to unrestricted, intense activity. If you can walk 30 minutes without difficulty, it is safe to try more intense activity such as jogging, treadmill, bicycling, low-impact aerobics, swimming, etc. Save the most intensive and strenuous activity for last (Usually 4-8 weeks after surgery) such as sit-ups, heavy lifting, contact sports, etc.  Refrain from any intense heavy lifting or straining until you are off narcotics for pain control.  You will have  off days, but things should improve week-by-week. DO NOT PUSH THROUGH PAIN.  Let pain be your guide: If it hurts to do something, don't do it.  Pain is your body warning you to avoid that activity for another week until the pain goes down. You may drive when you are no longer taking narcotic prescription pain medication, you can comfortably wear a seatbelt, and you can safely make sudden turns/stops to protect yourself without hesitating due  to pain. You may have sexual intercourse when it is comfortable. If it hurts to do something, stop.   MEDICATIONS Take your usually prescribed home medications unless otherwise directed.    Blood thinners:  You can restart any strong blood thinners after the second postoperative day  for example: COUMADIN (warfarin), XERELTO (rivaroxaban), ELIQUIS (apixaban), PLAVIX (clopidigrel), BRILINTA (ticagrelor), EFFIENT (prasugrel), PRADAXA (dabigatran), etc  Continue aspirin before & after surgery..     Some oozing/bleeding the first 1-2 weeks is common but should taper down & be small volume.    If you are passing many large clots or having uncontrolling bleeding, call your surgeon    PAIN CONTROL Pain after surgery or related to activity is often due to strain/injury to muscle, tendon, nerves and/or incisions.  This pain is usually short-term and will improve in a few months.  To help speed the process of healing and to get back to regular activity more quickly, DO THE FOLLOWING THINGS TOGETHER: Increase activity gradually.  DO NOT PUSH THROUGH PAIN Use Ice and/or Heat Try Gentle Massage and/or Stretching Take over the counter pain medication Take Narcotic prescription pain medication for more severe pain  Good pain control = faster recovery.  It is better to take more medicine to be more active than to stay in bed all day to avoid medications.  Increase activity gradually Avoid heavy lifting at first, then increase to lifting as tolerated over the next 6 weeks. Do not "push through" the pain.  Listen to your body and avoid positions and maneuvers than reproduce the pain.  Wait a few days before trying something more intense Walking an hour a day is encouraged to help your body recover faster and more safely.  Start slowly and stop when getting sore.  If you can walk 30 minutes without stopping or pain, you can try more intense activity (running, jogging, aerobics, cycling, swimming,  treadmill, sex, sports, weightlifting, etc.) Remember: If it hurts to do it, then don't do it! Use Ice and/or Heat You will have swelling and bruising around the incisions.  This will take several weeks to resolve. Ice packs or heating pads (6-8 times a day, 30-60 minutes at a time) will help sooth soreness & bruising. Some people prefer to use ice alone, heat alone, or alternate between ice & heat.  Experiment and see what works best for you.  Consider trying ice for the first few days to help decrease swelling and bruising; then, switch to heat to help relax sore spots and speed recovery. Shower every day.  Short baths are fine.  It feels good!  Keep the incisions and wounds clean with soap & water.   Try Gentle Massage and/or Stretching Massage at the area of pain many times a day Stop if you feel pain - do not overdo it Take over the counter pain medication This helps the muscle and nerve tissues become less irritable and calm down faster Choose ONE of the following over-the-counter anti-inflammatory medications: Acetaminophen 500mg  tabs (Tylenol) 1-2 pills with every  meal and just before bedtime (avoid if you have liver problems or if you have acetaminophen in you narcotic prescription) Naproxen 220mg  tabs (ex. Aleve, Naprosyn) 1-2 pills twice a day (avoid if you have kidney, stomach, IBD, or bleeding problems) Ibuprofen 200mg  tabs (ex. Advil, Motrin) 3-4 pills with every meal and just before bedtime (avoid if you have kidney, stomach, IBD, or bleeding problems) Take with food/snack several times a day as directed for at least 2 weeks to help keep pain / soreness down & more manageable. Take Narcotic prescription pain medication for more severe pain A prescription for strong pain control is often given to you upon discharge (for example: oxycodone/Percocet, hydrocodone/Norco/Vicodin, or tramadol/Ultram) Take your pain medication as prescribed. Be mindful that most narcotic prescriptions  contain Tylenol (acetaminophen) as well - avoid taking too much Tylenol. If you are having problems/concerns with the prescription medicine (does not control pain, nausea, vomiting, rash, itching, etc.), please call us [phone] to see if we need to switch you to a different pain medicine that will work better for you and/or control your side effects better. If you need a refill on your pain medication, you must call the office before 4 pm and on weekdays only.  By federal law, prescriptions for narcotics cannot be called into a pharmacy.  They must be filled out on paper & picked up from our office by the patient or authorized caretaker.  Prescriptions cannot be filled after 4 pm nor on weekends.    WHEN TO CALL us [phone] Severe uncontrolled or worsening pain  Fever over 101 F (38.5 C) Concerns with the incision: Worsening pain, redness, rash/hives, swelling, bleeding, or drainage Reactions / problems with new medications (itching, rash, hives, nausea, etc.) Nausea and/or vomiting Difficulty urinating Difficulty breathing Worsening fatigue, dizziness, lightheadedness, blurred vision Other concerns If you are not getting better after two weeks or are noticing you are getting worse, contact our office [phone] for further advice.  We may need to adjust your medications, re-evaluate you in the office, send you to the emergency room, or see what other things we can do to help. The clinic staff is available to answer your questions during regular business hours (8:30am-5pm).  Please don't hesitate to call and ask to speak to one of our nurses for clinical concerns.    A surgeon from [facility] is always on call at the hospitals 24 hours/day If you have a medical emergency, go to the nearest emergency room or call 911.  FOLLOW UP in our office One the day of your discharge from the hospital (or the next business weekday), please call [facility] to set up  or confirm an appointment to see your surgeon in the office for a follow-up appointment.  Usually it is 2-3 weeks after your surgery.   If you have skin staples at your incision(s), let the office know so we can set up a time in the office for the nurse to remove them (usually around 10 days after surgery). Make sure that you call for appointments the day of discharge (or the next business weekday) from the hospital to ensure a convenient appointment time. IF YOU HAVE DISABILITY OR FAMILY LEAVE FORMS, BRING THEM TO THE OFFICE FOR PROCESSING.  DO NOT GIVE THEM TO YOUR DOCTOR.  [facility] [address], [place], [place]  [address] ? [phone] - Main [phone] - Toll Free,  [phone] - Fax [email or web address]  GETTING TO GOOD BOWEL HEALTH. It is expected for your digestive tract to need a few months to get back to normal.  It is common for your bowel movements and stools to be irregular.  You will have occasional bloating and cramping that should eventually fade away.  Until you are eating solid food normally, off all pain medications, and back to regular activities; your bowels will not be normal.   Avoiding constipation The goal: ONE SOFT BOWEL MOVEMENT A DAY!    Drink plenty of fluids.  Choose water first. TAKE A FIBER SUPPLEMENT EVERY DAY THE REST OF YOUR LIFE During your first week back home, gradually add back a fiber supplement every day Experiment which form you can tolerate.   There are many forms such as powders, tablets, wafers, gummies, etc Psyllium bran (Metamucil), methylcellulose (Citrucel), Miralax or Glycolax, Benefiber, Flax Seed.  Adjust the dose week-by-week (1/2 dose/day to 6 doses a day) until you are moving your bowels 1-2 times a day.  Cut back the dose or try a different fiber product if it is giving you problems such as diarrhea or bloating. Sometimes a laxative is needed to help jump-start bowels if  constipated until the fiber supplement can help regulate your bowels.  If you are tolerating eating & you are farting, it is okay to try a gentle laxative such as double dose MiraLax, prune juice, or Milk of Magnesia.  Avoid using laxatives too often. Stool softeners can sometimes help counteract the constipating effects of narcotic pain medicines.  It can also cause diarrhea, so avoid using for too long. If you are still constipated despite taking fiber daily, eating solids, and a few doses of laxatives, call our office. Controlling diarrhea Try drinking liquids and eating bland foods for a few days to avoid stressing your intestines further. Avoid dairy products (especially milk & ice cream) for a short time.  The intestines often can lose the ability to digest lactose when stressed. Avoid foods that cause gassiness or bloating.  Typical foods include beans and other legumes, cabbage, broccoli, and dairy foods.  Avoid greasy, spicy, fast foods.  Every person has some sensitivity to other foods, so listen to your body and avoid those foods that trigger problems for you. Probiotics (such as active yogurt, Align, etc) may help repopulate the intestines and colon with normal bacteria and calm down a sensitive digestive tract Adding a fiber supplement gradually can help thicken stools by absorbing excess fluid and retrain the intestines to act more normally.  Slowly increase the dose over a few weeks.  Too much fiber too soon can backfire and cause cramping & bloating. It is okay to try and slow down diarrhea with a few doses of antidiarrheal medicines.   Bismuth subsalicylate (ex. Kayopectate, Pepto Bismol) for a few doses can help control diarrhea.  Avoid if pregnant.   Loperamide (Imodium) can slow down diarrhea.  Start with one tablet (2mg ) first.  Avoid if you are having fevers or severe pain.  ILEOSTOMY PATIENTS WILL HAVE CHRONIC DIARRHEA since their colon is not in use.    Drink plenty of liquids.   You will need to drink even more glasses of water/liquid a day to avoid getting dehydrated. Record output from your ileostomy.  Expect to empty the bag every 3-4 hours at first.  Most people with a permanent ileostomy empty their bag 4-6 times at the least.   Use antidiarrheal medicine (especially Imodium) several times a day to avoid getting dehydrated.  Start with a dose at bedtime & breakfast.  Adjust up or down as needed.  Increase antidiarrheal medications as directed to avoid emptying the bag more than 8 times a day (every 3 hours). Work with your wound ostomy nurse to learn care for your ostomy.  See ostomy care instructions. TROUBLESHOOTING IRREGULAR BOWELS 1) Start with a soft & bland diet. No spicy, greasy, or fried foods.  2) Avoid gluten/wheat or dairy products from diet to see if symptoms improve. 3) Miralax 17gm or flax seed mixed in 8oz. water or juice-daily. May use 2-4 times a day as needed. 4) Gas-X, Phazyme, etc. as needed for gas & bloating.  5) Prilosec (omeprazole) over-the-counter as needed 6)  Consider probiotics (Align, Activa, etc) to help calm the bowels down  Call your doctor if you are getting worse or not getting better.  Sometimes further testing (cultures, endoscopy, X-ray studies, CT scans, bloodwork, etc.) may be needed to help diagnose and treat the cause of the diarrhea. [facility] [address], [place], [place]  [address] [phone] - Main.    [phone]  - Toll Free.   [phone] - Fax [email or web address]

## 2023-09-22 ENCOUNTER — Encounter (HOSPITAL_COMMUNITY): Payer: Self-pay | Admitting: Surgery

## 2023-09-22 DIAGNOSIS — Z9181 History of falling: Secondary | ICD-10-CM

## 2023-09-22 DIAGNOSIS — K922 Gastrointestinal hemorrhage, unspecified: Secondary | ICD-10-CM | POA: Diagnosis not present

## 2023-09-22 DIAGNOSIS — C180 Malignant neoplasm of cecum: Secondary | ICD-10-CM

## 2023-09-22 HISTORY — DX: Malignant neoplasm of cecum: C18.0

## 2023-09-22 LAB — TYPE AND SCREEN
ABO/RH(D): A NEG
Antibody Screen: NEGATIVE
Unit division: 0
Unit division: 0
Unit division: 0

## 2023-09-22 LAB — BPAM RBC
Blood Product Expiration Date: 202502202359
Blood Product Expiration Date: 202502202359
Blood Product Expiration Date: 202502242359
ISSUE DATE / TIME: 202502142056
ISSUE DATE / TIME: 202502170738
ISSUE DATE / TIME: 202502170738
Unit Type and Rh: 600
Unit Type and Rh: 600
Unit Type and Rh: 600

## 2023-09-22 LAB — CBC
HCT: 33.8 % — ABNORMAL LOW (ref 36.0–46.0)
Hemoglobin: 10 g/dL — ABNORMAL LOW (ref 12.0–15.0)
MCH: 27.2 pg (ref 26.0–34.0)
MCHC: 29.6 g/dL — ABNORMAL LOW (ref 30.0–36.0)
MCV: 92.1 fL (ref 80.0–100.0)
Platelets: 279 10*3/uL (ref 150–400)
RBC: 3.67 MIL/uL — ABNORMAL LOW (ref 3.87–5.11)
RDW: 18.5 % — ABNORMAL HIGH (ref 11.5–15.5)
WBC: 11.5 10*3/uL — ABNORMAL HIGH (ref 4.0–10.5)
nRBC: 0 % (ref 0.0–0.2)

## 2023-09-22 LAB — COMPREHENSIVE METABOLIC PANEL
ALT: 9 U/L (ref 0–44)
AST: 13 U/L — ABNORMAL LOW (ref 15–41)
Albumin: 2.7 g/dL — ABNORMAL LOW (ref 3.5–5.0)
Alkaline Phosphatase: 37 U/L — ABNORMAL LOW (ref 38–126)
Anion gap: 10 (ref 5–15)
BUN: 12 mg/dL (ref 8–23)
CO2: 26 mmol/L (ref 22–32)
Calcium: 8.5 mg/dL — ABNORMAL LOW (ref 8.9–10.3)
Chloride: 100 mmol/L (ref 98–111)
Creatinine, Ser: 0.95 mg/dL (ref 0.44–1.00)
GFR, Estimated: 58 mL/min — ABNORMAL LOW (ref >60)
Glucose, Bld: 110 mg/dL — ABNORMAL HIGH (ref 70–99)
Potassium: 3.3 mmol/L — ABNORMAL LOW (ref 3.5–5.1)
Sodium: 136 mmol/L (ref 135–145)
Total Bilirubin: 1 mg/dL (ref 0.0–1.2)
Total Protein: 5.4 g/dL — ABNORMAL LOW (ref 6.5–8.1)

## 2023-09-22 LAB — CEA: CEA: 10.9 ng/mL — ABNORMAL HIGH (ref 0.0–4.7)

## 2023-09-22 LAB — MAGNESIUM: Magnesium: 1.8 mg/dL (ref 1.7–2.4)

## 2023-09-22 MED ORDER — POTASSIUM CHLORIDE CRYS ER 10 MEQ PO TBCR
40.0000 meq | EXTENDED_RELEASE_TABLET | Freq: Once | ORAL | Status: AC
  Administered 2023-09-22: 40 meq via ORAL
  Filled 2023-09-22: qty 4

## 2023-09-22 MED ORDER — MIDODRINE HCL 5 MG PO TABS
5.0000 mg | ORAL_TABLET | Freq: Three times a day (TID) | ORAL | Status: DC
  Filled 2023-09-22: qty 1

## 2023-09-22 MED ORDER — SODIUM CHLORIDE 0.9 % IV BOLUS
250.0000 mL | Freq: Once | INTRAVENOUS | Status: AC
  Administered 2023-09-22: 250 mL via INTRAVENOUS

## 2023-09-22 NOTE — Evaluation (Signed)
 Physical Therapy Evaluation Patient Details Name: Mallory Stephens MRN: [id number] DOB: 1932/12/05 Today's Date: 09/22/2023  History of Present Illness  89 year old female comes into the hospital with anemia, abdominal discomfort as well as maroon stools, S/P partial colectomy 09/21/23 . WNU:UVOZ history of breast cancer, HTN, permanent A-fib on Eliquis  Clinical Impression  Pt admitted with above diagnosis.  Pt currently with functional limitations due to the deficits listed below (see PT Problem List). Pt will benefit from acute skilled PT to increase their independence and safety with mobility to allow discharge.     The patient is very cheerful and motivated to mobilize. Patient resides with sister and ambulates independently with no device.  Patient reports mild abdominal pain. Patient assisted to recliner using a RW. Patient will benefit from continued inpatient follow up therapy, <3 hours/day       If plan is discharge home, recommend the following: A little help with walking and/or transfers;Assistance with cooking/housework;Assist for transportation;Help with stairs or ramp for entrance   Can travel by private vehicle        Equipment Recommendations None recommended by PT  Recommendations for Other Services       Functional Status Assessment Patient has had a recent decline in their functional status and demonstrates the ability to make significant improvements in function in a reasonable and predictable amount of time.     Precautions / Restrictions Precautions Precautions: Fall Restrictions Weight Bearing Restrictions Per Provider Order: No      Mobility  Bed Mobility Overal bed mobility: Needs Assistance Bed Mobility: Rolling, Sidelying to Sit Rolling: Contact guard assist Sidelying to sit: Min assist       General bed mobility comments: cues for abdomen  precautions    Transfers Overall transfer level: Needs assistance Equipment used: Rolling walker (2  wheels) Transfers: Sit to/from Stand, Bed to chair/wheelchair/BSC Sit to Stand: Min assist           General transfer comment: mina aasit to steady and step to recliner, assist with lines    Ambulation/Gait                  Stairs            Wheelchair Mobility     Tilt Bed    Modified Rankin (Stroke Patients Only)       Balance Overall balance assessment: Needs assistance Sitting-balance support: Single extremity supported, No upper extremity supported Sitting balance-Leahy Scale: Fair     Standing balance support: During functional activity, Bilateral upper extremity supported, Reliant on assistive device for balance Standing balance-Leahy Scale: Poor                               Pertinent Vitals/Pain Pain Assessment Pain Assessment: Faces Faces Pain Scale: Hurts a little bit Pain Location: abd. Pain Descriptors / Indicators: Aching Pain Intervention(s): Monitored during session    Home Living Family/patient expects to be discharged to:: Private residence Living Arrangements: Other relatives Available Help at Discharge: Available 24 hours/day;Available PRN/intermittently Type of Home: House Home Access: Stairs to enter Entrance Stairs-Rails: Right Entrance Stairs-Number of Steps: 3 Alternate Level Stairs-Number of Steps: stair lift to upstairs Home Layout: Two level;Full bath on main level;Able to live on main level with bedroom/bathroom Home Equipment: Rolling Walker (2 wheels);Cane - single point;Grab bars - tub/shower;Shower seat Additional Comments: information from pt and previous chart. Pt is a questionable historian.    Prior  Function Prior Level of Function : Needs assist             Mobility Comments: pt reports she does not use an AD ADLs Comments: independent in ADL per pt, niece provides set-up A for medication management, sister cooks,  lives with sister     Extremity/Trunk Assessment   Upper Extremity  Assessment Upper Extremity Assessment: Overall WFL for tasks assessed    Lower Extremity Assessment Lower Extremity Assessment: Generalized weakness    Cervical / Trunk Assessment Cervical / Trunk Assessment: Kyphotic  Communication        Cognition Arousal: Alert Behavior During Therapy: WFL for tasks assessed/performed   PT - Cognitive impairments: No family/caregiver present to determine baseline                       PT - Cognition Comments: follows directions, cues to stay on stask         Cueing       General Comments      Exercises     Assessment/Plan    PT Assessment Patient needs continued PT services  PT Problem List Decreased strength;Decreased mobility;Decreased activity tolerance;Decreased balance;Pain;Decreased cognition;Decreased knowledge of precautions;Decreased safety awareness       PT Treatment Interventions DME instruction;Therapeutic activities;Gait training;Therapeutic exercise;Patient/family education;Functional mobility training    PT Goals (Current goals can be found in the Care Plan section)  Acute Rehab PT Goals Patient Stated Goal: go to rehab PT Goal Formulation: With patient Time For Goal Achievement: 10/06/23 Potential to Achieve Goals: Good    Frequency Min 1X/week     Co-evaluation               AM-PAC PT "6 Clicks" Mobility  Outcome Measure Help needed turning from your back to your side while in a flat bed without using bedrails?: A Little Help needed moving from lying on your back to sitting on the side of a flat bed without using bedrails?: A Little Help needed moving to and from a bed to a chair (including a wheelchair)?: A Lot Help needed standing up from a chair using your arms (e.g., wheelchair or bedside chair)?: A Lot Help needed to walk in hospital room?: Total Help needed climbing 3-5 steps with a railing? : Total 6 Click Score: 12    End of Session Equipment Utilized During Treatment: Gait  belt Activity Tolerance: Patient limited by fatigue Patient left: in chair;with chair alarm set;with call bell/phone within reach Nurse Communication: Mobility status PT Visit Diagnosis: Unsteadiness on feet (R26.81);Muscle weakness (generalized) (M62.81);Difficulty in walking, not elsewhere classified (R26.2);Pain    Time: 1220-1240 PT Time Calculation (min) (ACUTE ONLY): 20 min   Charges:   PT Evaluation $PT Eval Low Complexity: 1 Low   PT General Charges $$ ACUTE PT VISIT: 1 Visit         Blanchard Kelch PT Acute Rehabilitation Services Office [phone] Weekend [phone]   Rada Hay 09/22/2023, 3:21 PM

## 2023-09-22 NOTE — TOC Progression Note (Signed)
 Transition of Care [facility]) - Progression Note    Patient Details  Name: Mallory Stephens MRN: [id number] Date of Birth: 1933-04-16  [facility]) CM/SW Contact  Beckie Busing, RN Phone Number:[phone]  09/22/2023, 11:43 AM  Clinical Narrative:    Patient is active with [facility] RN and PT.         Expected Discharge Plan and Services                                               Social Determinants of Health (SDOH) Interventions SDOH Screenings   Food Insecurity: No Food Insecurity (09/18/2023)  Housing: Low Risk  (09/18/2023)  Transportation Needs: No Transportation Needs (09/18/2023)  Utilities: Not At Risk (09/18/2023)  Social Connections: Socially Isolated (09/18/2023)  Tobacco Use: Medium Risk (09/18/2023)    Readmission Risk Interventions    09/20/2023    9:48 AM 10/28/2022    4:35 PM  Readmission Risk Prevention Plan  Transportation Screening Complete Complete  PCP or Specialist Appt within 5-7 Days  Complete  PCP or Specialist Appt within 3-5 Days Complete   Home Care Screening  Complete  Medication Review (RN CM)  Referral to Pharmacy  HRI or Home Care Consult Complete   Social Work Consult for Recovery Care Planning/Counseling Complete

## 2023-09-22 NOTE — Evaluation (Signed)
 Occupational Therapy Evaluation Patient Details Name: Mallory Stephens MRN: [id number] DOB: [birth year] Today's Date: 09/22/2023   History of Present Illness   89 year old female comes into the hospital with anemia, abdominal discomfort as well as maroon stools, S/P partial colectomy 09/21/23 . ONG:EXBM history of breast cancer, HTN, permanent A-fib on Eliquis     Clinical Impressions Pt presents with decline in function and safety with ADLs and ADL mobility with impaired balance and endurance. PTA pt reports that she lives with her sister and that she was Ind with ADLs, light home mgt, no AD for mobility, doe snot drive and niece assists with med mgt set up. Pt currently requires mod A with LB ADLs, mod A with toileting tasks and min A with mobility/transfers using RW. Pt would benefit from acute OT services to address impairments to maximize level of function and safety     If plan is discharge home, recommend the following:   A lot of help with bathing/dressing/bathroom;A little help with walking and/or transfers;Assistance with cooking/housework;Help with stairs or ramp for entrance     Functional Status Assessment   Patient has had a recent decline in their functional status and demonstrates the ability to make significant improvements in function in a reasonable and predictable amount of time.     Equipment Recommendations   None recommended by OT     Recommendations for Other Services         Precautions/Restrictions   Precautions Precautions: Fall Restrictions Weight Bearing Restrictions Per Provider Order: No     Mobility Bed Mobility               General bed mobility comments: pt in chair upo arrival, mod A with LEs onto bed    Transfers Overall transfer level: Needs assistance Equipment used: Rolling walker (2 wheels) Transfers: Sit to/from Stand, Bed to chair/wheelchair/BSC Sit to Stand: Min assist                  Balance Overall  balance assessment: Needs assistance Sitting-balance support: Single extremity supported, No upper extremity supported Sitting balance-Leahy Scale: Fair     Standing balance support: During functional activity, Bilateral upper extremity supported, Reliant on assistive device for balance Standing balance-Leahy Scale: Poor                             ADL either performed or assessed with clinical judgement   ADL Overall ADL's : Needs assistance/impaired Eating/Feeding: Independent   Grooming: Wash/dry hands;Wash/dry face;Contact guard assist;Sitting   Upper Body Bathing: Contact guard assist;Sitting   Lower Body Bathing: Moderate assistance   Upper Body Dressing : Contact guard assist;Sitting   Lower Body Dressing: Moderate assistance   Toilet Transfer: Minimal assistance;Ambulation;Rolling walker (2 wheels);BSC/3in1;Cueing for safety   Toileting- Clothing Manipulation and Hygiene: Minimal assistance;Sit to/from stand       Functional mobility during ADLs: Minimal assistance;Rolling walker (2 wheels);Cueing for safety       Vision Baseline Vision/History: 1 Wears glasses Ability to See in Adequate Light: 0 Adequate Patient Visual Report: No change from baseline       Perception         Praxis         Pertinent Vitals/Pain Pain Assessment Pain Assessment: Faces Faces Pain Scale: Hurts a little bit Pain Location: abd. Pain Descriptors / Indicators: Aching, Sore Pain Intervention(s): Monitored during session, Repositioned     Extremity/Trunk Assessment Upper Extremity Assessment Upper  Extremity Assessment: Overall WFL for tasks assessed   Lower Extremity Assessment Lower Extremity Assessment: Defer to PT evaluation   Cervical / Trunk Assessment Cervical / Trunk Assessment: Kyphotic   Communication Communication Communication: No apparent difficulties   Cognition Arousal: Alert Behavior During Therapy: WFL for tasks  assessed/performed Cognition: No apparent impairments                               Following commands: Intact       Cueing  General Comments          Exercises     Shoulder Instructions      Home Living Family/patient expects to be discharged to:: Private residence Living Arrangements: Other relatives (sister) Available Help at Discharge: Available 24 hours/day;Available PRN/intermittently Type of Home: House Home Access: Stairs to enter [employer] of Steps: 3 Entrance Stairs-Rails: Right Home Layout: Two level;Full bath on main level;Able to live on main level with bedroom/bathroom Alternate Level Stairs-Number of Steps: stair lift to upstairs Alternate Level Stairs-Rails: Right;Left Bathroom Shower/Tub: Producer, television/film/video: Handicapped height     Home Equipment: Agricultural consultant (2 wheels);Cane - single point;Grab bars - tub/shower;Shower seat   Additional Comments: information from pt and previous chart. Pt is a questionable historian.      Prior Functioning/Environment Prior Level of Function : Needs assist             Mobility Comments: pt reports she does not use an AD ADLs Comments: independent in ADL per pt, niece provides set-up A for medication management, sister cooks,  lives with sister, does not drive    OT Problem List: Pain;Impaired balance (sitting and/or standing);Decreased activity tolerance;Decreased knowledge of use of DME or AE   OT Treatment/Interventions: Self-care/ADL training;DME and/or AE instruction;Therapeutic activities;Balance training;Patient/family education      OT Goals(Current goals can be found in the care plan section)   Acute Rehab OT Goals Patient Stated Goal: get well OT Goal Formulation: With patient Time For Goal Achievement: 10/06/23 Potential to Achieve Goals: Good ADL Goals Pt Will Perform Grooming: with contact guard assist;with supervision;standing Pt Will Perform  Upper Body Bathing: with supervision;with set-up Pt Will Perform Upper Body Dressing: with supervision;with set-up Pt Will Transfer to Toilet: with contact guard assist;with supervision;ambulating Pt Will Perform Toileting - Clothing Manipulation and hygiene: with min assist;with contact guard assist;sitting/lateral leans;sit to/from stand   OT Frequency:  Min 1X/week    Co-evaluation              AM-PAC OT "6 Clicks" Daily Activity     Outcome Measure Help from another person eating meals?: None Help from another person taking care of personal grooming?: A Little Help from another person toileting, which includes using toliet, bedpan, or urinal?: A Lot Help from another person bathing (including washing, rinsing, drying)?: A Lot Help from another person to put on and taking off regular upper body clothing?: A Little Help from another person to put on and taking off regular lower body clothing?: A Lot 6 Click Score: 16   End of Session Equipment Utilized During Treatment: Gait belt;Rolling walker (2 wheels);Other (comment) (BSC)  Activity Tolerance: Patient tolerated treatment well Patient left: in bed;with call bell/phone within reach  OT Visit Diagnosis: Unsteadiness on feet (R26.81);Other abnormalities of gait and mobility (R26.89)                Time: 1914-7829 OT Time Calculation (  min): 26 min Charges:  OT General Charges $OT Visit: 1 Visit OT Evaluation $OT Eval Moderate Complexity: 1 Mod OT Treatments $Therapeutic Activity: 8-22 mins    Galen Manila 09/22/2023, 3:53 PM

## 2023-09-22 NOTE — Progress Notes (Signed)
 09/22/2023  Mallory Stephens [id number] May 24, 1933  CARE TEAM: PCP: Mila Palmer, MD  Outpatient Care Team: Patient Care Team: Mila Palmer, MD as PCP - General (Family Medicine) Quintella Reichert, MD as PCP - Cardiology (Cardiology) Dorothy Puffer, MD as Consulting Physician (Radiation Oncology) Quintella Reichert, MD as Consulting Physician (Cardiology) Shea Evans, MD as Consulting Physician (Obstetrics and Gynecology) Cherlyn Roberts, MD as Referring Physician (Dermatology) Lynann Bologna, DO as Consulting Physician (Gastroenterology) Karie Soda, MD as Consulting Physician (Colon and Rectal Surgery) Almond Lint, MD as Consulting Physician (Surgical Oncology)  Inpatient Treatment Team: Treatment Team:  Leatha Gilding, MD Delmer Islam, MD [facility], Md, MD Arnold Long, [place] Eddie Candle, RN Cindi Carbon, [facility]   Problem List:   Principal Problem:   Lower GI bleeding   09/21/2023  POST-OPERATIVE DIAGNOSIS:   CECAL COLONIC MASS WITH BLEEDING   PROCEDURE:   LAPAROSCOPIC, PROXIMAL "RIGHT" COLECTOMY WITH ANASTOMOSIS TRANSVERSUS ABDOMINIS PLANE (TAP) BLOCK - BILATERAL   SURGEON:  Ardeth Sportsman, MD  OR FINDINGS:    Patient had very bulky mass involving cecum and part of ascending colon highly suspicious for malignancy.  No obvious metastatic disease on visceral parietal peritoneum or liver.   It is an isoperistaltic ileocolonic anastomosis that rests in the periumbilical region.   CASE DATA: Type of patient?: LDOW CASE (Surgical Hospitalist [facility] Inpatient) Status of Case? URGENT Add On Infection Present At Time Of Surgery (PATOS)?  NO  Assessment [facility] Stay = 4 days) 1 Day Post-Op    Recovering status post proximal right colectomy laparoscopically for bulky cecal mass presumed cancer and source of lower GI bleeding    Plan:  ERAS protocol.  Tolerating clears.  Vance to dysphagia 1/full liquid diet.  Stable in stepdown unit.  Was  but they are mainly because of her central line.  I think we get rid of that entrance of floor.  See if primary service agrees.  Follow-up on pathology.  Keep on the dry side.  [place] from surgery standpoint.  Seems to have labile hypertension.  Defer to primary service.  DC Foley per protocol.  Follow-up on labs.  -VTE prophylaxis- SCDs.  Enoxaparin for now.  If hemoglobin stable x 48 hours postop, restart home oral anticoagulation  -mobilize as tolerated to help recovery Involve therapies.  Given her advanced age and semidependence on her equally old sister, patient's niece is concerned about going home and suspects patient would benefit from skilled nursing facility with rehab for a while to get stronger.  I suspect that is the case but we will get the therapies and support involved and get a plan when she is getting closer to discharge. -Disposition:  Disposition:  The patient is from: Home Anticipate discharge to:  Skilled Nursing Facility (SNF) Anticipated Date of Discharge is:  February 21,2025   Barriers to discharge:  Consultant clearance & sign off  , Therapy assessment & Recommendations pending, and Pending Clinical improvement (more likely than not)  Patient currently is NOT MEDICALLY STABLE for discharge from the hospital from a surgery standpoint.      I reviewed nursing notes, hospitalist notes, last 24 h vitals and pain scores, last 48 h intake and output, last 24 h labs and trends, and last 24 h imaging results.  I have reviewed this patient's available data, including medical history, events of note, test results, etc as part of my evaluation.   A significant portion of that time was spent in counseling.  Care during the described time interval was provided by me.  This care required moderate level of medical decision making.  09/22/2023    Subjective: (Chief complaint)  No events in stepdown unit.  Nursing and niece in room.  Patient had a little nausea  last night but feeling better.  Tolerating liquids.  Denies much pain.  Objective:  Vital signs:  Vitals:   09/22/23 0500 09/22/23 0600 09/22/23 0700 09/22/23 0800  BP: (!) 101/31 (!) 87/41 (!) 161/58 (!) 122/48  Pulse: 81 65 86 82  Resp: 12 12 13 18   Temp:   97.8 F (36.6 C)   TempSrc:   Oral   SpO2: 99% 99% 100% 95%  Weight:      Height:        Last BM Date : 09/20/23  Intake/Output   Yesterday:  02/17 0701 - 02/18 0700 In: 2972.9 [P.O.:250; I.V.:2203.2; Blood:315; IV Piggyback:204.7] Out: 300 [Urine:200; Blood:100] This shift:  Total I/O In: 546.9 [IV Piggyback:546.9] Out: -   Bowel function:  Flatus: YES  BM:  No  Drain: (No drain)   Physical Exam:  General: Pt awake/alert in no acute distress.  Sitting up smiling. Eyes: PERRL, normal EOM.  Sclera clear.  No icterus Neuro: CN II-XII intact w/o focal sensory/motor deficits.  Mild delay in answers a second is her baseline but otherwise alert with no strong evidence of any memory loss/dementia Lymph: No head/neck/groin lymphadenopathy Psych:  No delerium/psychosis/paranoia.  Oriented x 4 HENT: Normocephalic, Mucus membranes moist.  No thrush Neck: Supple, No tracheal deviation.  No obvious thyromegaly Chest: No pain to chest wall compression.  Good respiratory excursion.  No audible wheezing CV:  Pulses intact.  Regular rhythm.  No major extremity edema MS: Normal AROM mjr joints.  No obvious deformity  Abdomen: Soft.  Nondistended.  Mildly tender at incisions only.  No evidence of peritonitis.  No incarcerated hernias.  Ext:  No deformity.  No mjr edema.  No cyanosis Skin: No petechiae / purpurea.  No major sores.  Warm and dry    Results:   Cultures: Recent Results (from the past 720 hours)  Surgical pcr screen     Status: None   Collection Time: 09/20/23  5:59 PM   Specimen: Nasal Mucosa; Nasal Swab  Result Value Ref Range Status   MRSA, PCR NEGATIVE NEGATIVE Final   Staphylococcus aureus  NEGATIVE NEGATIVE Final    Comment: (NOTE) The Xpert SA Assay (FDA approved for NASAL specimens in patients 88 years of age and older), is one component of a comprehensive surveillance program. It is not intended to diagnose infection nor to guide or monitor treatment. Performed at [facility], [address] [place], [place] [address]     Labs: Results for orders placed or performed during the hospital encounter of 09/18/23 (from the past 48 hours)  Surgical pcr screen     Status: None   Collection Time: 09/20/23  5:59 PM   Specimen: Nasal Mucosa; Nasal Swab  Result Value Ref Range   MRSA, PCR NEGATIVE NEGATIVE   Staphylococcus aureus NEGATIVE NEGATIVE    Comment: (NOTE) The Xpert SA Assay (FDA approved for NASAL specimens in patients 22 years of age and older), is one component of a comprehensive surveillance program. It is not intended to diagnose infection nor to guide or monitor treatment. Performed at [facility], [address], [place], [place] [address]   CBC     Status: Abnormal   Collection  Time: 09/21/23  6:32 AM  Result Value Ref Range   WBC 9.7 4.0 - 10.5 K/uL   RBC 3.49 (L) 3.87 - 5.11 MIL/uL   Hemoglobin 9.4 (L) 12.0 - 15.0 g/dL   HCT 16.1 (L) 09.6 - 04.5 %   MCV 95.7 80.0 - 100.0 fL   MCH 26.9 26.0 - 34.0 pg   MCHC 28.1 (L) 30.0 - 36.0 g/dL   RDW 40.9 (H) 81.1 - 91.4 %   Platelets 304 150 - 400 K/uL   nRBC 0.0 0.0 - 0.2 %    Comment: Performed at [facility], [address], [place], [place] [address]  Basic metabolic panel     Status: Abnormal   Collection Time: 09/21/23  6:32 AM  Result Value Ref Range   Sodium 137 135 - 145 mmol/L   Potassium 3.3 (L) 3.5 - 5.1 mmol/L   Chloride 103 98 - 111 mmol/L   CO2 23 22 - 32 mmol/L   Glucose, Bld 119 (H) 70 - 99 mg/dL    Comment: Glucose reference range applies only to samples taken after fasting for at least 8 hours.   BUN 12 8 - 23 mg/dL    Creatinine, Ser 6.21 0.44 - 1.00 mg/dL   Calcium 8.9 8.9 - 30.8 mg/dL   GFR, Estimated 58 (L) >60 mL/min    Comment: (NOTE) Calculated using the CKD-EPI Creatinine Equation (2021)    Anion gap 11 5 - 15    Comment: Performed at [facility], [address] [place], [place] [address]  Magnesium     Status: None   Collection Time: 09/21/23  6:32 AM  Result Value Ref Range   Magnesium 1.9 1.7 - 2.4 mg/dL    Comment: Performed at [facility], [address], [place], [place] [address]  Prepare RBC (crossmatch)     Status: None   Collection Time: 09/21/23  7:09 AM  Result Value Ref Range   Order Confirmation      ORDER PROCESSED BY BLOOD BANK Performed at [facility], [address], [place], [place] [address]     Imaging / Studies: X-ray chest PA or AP Result Date: 09/21/2023 CLINICAL DATA:  [id number] Central venous catheter in place, temporary [id number] EXAM: CHEST  1 VIEW COMPARISON:  Radiographs 09/20/2023 and 08/14/2023. Abdominal CTA 09/18/2023. FINDINGS: 1121 hours. Right IJ central venous catheter projects to the lower SVC level. Stable cardiomegaly and aortic atherosclerosis. There are increased interstitial opacities throughout both lungs, likely reflecting progressive pulmonary edema. Trace bilateral pleural effusions without evidence of pneumothorax. The bones appear unchanged. IMPRESSION: 1. Right IJ central venous catheter projects to the lower SVC level. No evidence of pneumothorax. 2. Increased interstitial opacities throughout both lungs, likely reflecting progressive pulmonary edema. Electronically Signed   By: Carey Bullocks M.D.   On: 09/21/2023 14:24   DG Chest 2 View Result Date: 09/20/2023 CLINICAL DATA:  [id number] Acute CHF (congestive heart failure) (HCC) [id number] EXAM: CHEST - 2 VIEW COMPARISON:  08/14/2023 FINDINGS: Cardiomegaly. Aortic atherosclerosis. Coarsened interstitial markings bilaterally. Calcified  granulomas are present. Trace bilateral pleural effusions. No pneumothorax. Chronic compression fractures at T11 and L1. IMPRESSION: 1. Cardiomegaly with trace bilateral pleural effusions. 2. Coarsened interstitial markings bilaterally which may reflect bronchitic lung changes or mild edema. Electronically Signed   By: Duanne Guess D.O.   On: 09/20/2023 14:07    Medications / Allergies: per chart  Antibiotics: Anti-infectives (From admission, onward)    Start  Dose/Rate Route Frequency Ordered Stop   09/21/23 2030  cefoTEtan (CEFOTAN) 2 g in sodium chloride 0.9 % 100 mL IVPB        2 g 200 mL/hr over 30 Minutes Intravenous Every 12 hours 09/21/23 1222 09/21/23 2228   09/21/23 0600  cefoTEtan (CEFOTAN) 2 g in sodium chloride 0.9 % 100 mL IVPB        2 g 200 mL/hr over 30 Minutes Intravenous On call to O.R. 09/20/23 1112 09/21/23 1247   09/20/23 1400  neomycin (MYCIFRADIN) tablet 1,000 mg       Placed in "And" Linked Group   1,000 mg Oral 3 times per day 09/20/23 1112 09/20/23 2146   09/20/23 1400  metroNIDAZOLE (FLAGYL) tablet 1,000 mg       Placed in "And" Linked Group   1,000 mg Oral 3 times per day 09/20/23 1112 09/20/23 2146         Note: Portions of this report may have been transcribed using voice recognition software. Every effort was made to ensure accuracy; however, inadvertent computerized transcription errors may be present.   Any transcriptional errors that result from this process are unintentional.    Ardeth Sportsman, MD, FACS, MASCRS Esophageal, Gastrointestinal & Colorectal Surgery Robotic and Minimally Invasive Surgery  [facility] [address], [address] [place], [place] [address] [phone] Fax [phone] Main  CONTACT INFORMATION: Weekday (9AM-5PM): Call [facility] main office at [phone] Weeknight (5PM-9AM) or Weekend/Holiday: Check EPIC "Web Links" tab & use "AMION" (password " [email or web address]") for  General Surgery [facility] coverage  Please, DO NOT use SecureChat  (it is not reliable communication to reach operating surgeons & will lead to a delay in care).   Epic staff messaging available for outptient concerns needing 1-2 business day response.      09/22/2023  8:33 AM

## 2023-09-22 NOTE — Progress Notes (Addendum)
 Patient with lower GI bleeding anemia on anticoagulation found to have cecal mass.  I did a laparoscopic proximal right colectomy with intracorporeal anastomosis yesterday.  She is doing well on the LDOW and hopefully can go home later this week.  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 Suspect she will just need survival pathway given her advanced age but do not know with her features if Xeloda would be an option since she is otherwise healthy.  Therefore recommend we set her up for discussion at tumor board   GI Tumor Board patient referral:   Mallory Stephens  [birth year] [id number]  CARE TEAM: Patient Care Team: Mila Palmer, MD as PCP - General (Family Medicine) Quintella Reichert, MD as PCP - Cardiology (Cardiology) Dorothy Puffer, MD as Consulting Physician (Radiation Oncology) Quintella Reichert, MD as Consulting Physician (Cardiology) Shea Evans, MD as Consulting Physician (Obstetrics and Gynecology) Cherlyn Roberts, MD as Referring Physician (Dermatology) Lynann Bologna, DO as Consulting Physician (Gastroenterology) Karie Soda, MD as Consulting Physician (Colon and Rectal Surgery) Almond Lint, MD as Consulting Physician (Surgical Oncology)  Diagnosis: Colon cancer  MD Care Team:  Karie Soda, MD - surgery and Gastroenterology:  Merlene Laughter of discussion: Radiology & Pathology reviews - comprehensive   Please send to GI Tumor Coordinator Gardiner Coins)  in [place] message and attach the medical record to it

## 2023-09-22 NOTE — Plan of Care (Signed)
   Problem: Education: Goal: Knowledge of General Education information will improve Description Including pain rating scale, medication(s)/side effects and non-pharmacologic comfort measures Outcome: Progressing   Problem: Health Behavior/Discharge Planning: Goal: Ability to manage health-related needs will improve Outcome: Progressing

## 2023-09-22 NOTE — Progress Notes (Signed)
 PROGRESS NOTE  Mallory Stephens GNF:[id number] DOB: 1932-10-20 DOA: 09/18/2023 PCP: Mila Palmer, MD   LOS: 4 days   Brief Narrative / Interim history: 89 year old female with history of breast cancer, HTN, permanent A-fib on Eliquis comes into the hospital with anemia, abdominal discomfort as well as maroon stools.  She has a history of iron deficiency anemia for which she has been worked up by hematology without a clear cause.  Over the last couple of days prior to admission has noticed that her stools were maroon and has been having abdominal discomfort.  She discussed with her hematologist, and eventually presented to the ER with concerns for GI bleed.  CT scan on admission raised concern about an irregular mass in the cecum/ascending colon, concerning for colonic malignancy.  GI was consulted, she was transfused unit of packed red blood cells and was admitted to the hospital  Subjective / 24h Interval events: Doing well, seems to be in better spirits this morning.  I saw her several times, earlier this morning she was slightly confused but cleared up upon my reevaluation around lunchtime  Assesement and Plan: Principal problem Lower GI bleed, acute blood loss anemia, iron deficiency anemia, and new diagnosis of colon cancer-patient was transfused a unit of packed red blood cells on 2/14 for hemoglobin of 7.3, improved appropriately.  Imaging on admission concerning for colonic malignancy in the cecum/ascending colon.  GI and general surgery consulted, she was taken to the OR 2/17 by Dr. Michaell Cowing, and is status post laparoscopic right colectomy with anastomosis -Doing well postoperatively, advance diet per general surgery  Active problems Chronic diastolic CHF-she was recently admitted to the hospital and discharged in January 2075 for acute on chronic diastolic CHF, responded well to diuretics.  Has been taking furosemide since, and currently does not appreciate any significant fluid  retention -Continue furosemide, fluid status acceptable and she is on room air  Fluctuating blood pressures-blood pressure has been stable preoperatively, however overnight last night has been soft at times requiring bolus of fluids.  I initially added midodrine however on repeat blood pressure checks upon patient waking up she has been hypertensive.  Midodrine now discontinued, continue with her home metoprolol as well as furosemide  Permanent atrial fibrillation-continue metoprolol, hold Eliquis perioperatively, resume as per general surgery  History of breast cancer-follows with oncology as an outpatient, continue anastrozole  Mood d/o -continue paroxetine  Hyperlipidemia-continue statin  Scheduled Meds:  sodium chloride   Intravenous Once   acetaminophen  1,000 mg Oral Q6H   alvimopan  12 mg Oral BID   anastrozole  1 mg Oral q AM   Chlorhexidine Gluconate Cloth  6 each Topical Daily   cholecalciferol  1,000 Units Oral Daily   enoxaparin (LOVENOX) injection  40 mg Subcutaneous Q24H   famotidine  40 mg Oral QHS   feeding supplement  237 mL Oral BID BM   fesoterodine  4 mg Oral Daily   furosemide  40 mg Oral Daily   gabapentin  200 mg Oral QHS   metoprolol tartrate  25 mg Oral BID   midodrine  5 mg Oral TID WC   PARoxetine  10 mg Oral BH-q7a   polycarbophil  625 mg Oral BID   potassium chloride  40 mEq Oral Once   pravastatin  20 mg Oral QHS   sodium chloride flush  3 mL Intravenous Q12H   Continuous Infusions:  sodium chloride     lactated ringers Stopped (09/22/23 0024)   PRN Meds:.sodium  chloride, albuterol, alum & mag hydroxide-simeth, benzocaine, hydrALAZINE, HYDROmorphone (DILAUDID) injection, lactated ringers, magic mouthwash, melatonin, menthol-cetylpyridinium, methocarbamol (ROBAXIN) injection, methocarbamol, metoprolol tartrate, ondansetron **OR** ondansetron (ZOFRAN) IV, phenol, polyvinyl alcohol, prochlorperazine **OR** prochlorperazine, simethicone, sodium chloride  flush, traMADol  Current Outpatient Medications  Medication Instructions   acetaminophen (TYLENOL) 325 mg, Every 6 hours PRN   anastrozole (ARIMIDEX) 1 mg, Oral, Daily   cholecalciferol (VITAMIN D3) 1,000 Units, Oral, Daily   Eliquis 5 mg, Oral, 2 times daily   famotidine (PEPCID) 40 mg, Daily at bedtime   furosemide (LASIX) 40 mg, Oral, Daily   furosemide (LASIX) 20 mg, Oral, Every morning   ibandronate (BONIVA) 150 MG tablet TAKE 1 TABLET BY MOUTH EVERY 30 DAYS ON THE 10TH DAY OF EACH MONTH IN THE MORNING WITH A FULL GLASS OF WATER,ON AN EMPTY STOMACH AND DO NOT TAKE ANYTHING ELSE BY MOUTH OR LIE DOWN FOR THE NEXT 30 MIN   losartan (COZAAR) 25 mg, Oral, Daily   metoprolol tartrate (LOPRESSOR) 25 MG tablet TAKE 1 TABLET TWICE A DAY   PARoxetine (PAXIL) 10 mg, BH-each morning   polyvinyl alcohol (LIQUIFILM TEARS) 1.4 % ophthalmic solution 1-2 drops, Every 6 hours PRN   pravastatin (PRAVACHOL) 20 mg, Daily at bedtime   solifenacin (VESICARE) 5 mg, Every morning    Diet Orders (From admission, onward)     Start     Ordered   09/22/23 0832  DIET - DYS 1 Room service appropriate? Yes; Fluid consistency: Thin  Diet effective now       Comments: Can switch to full liquid diet if preferred If patient experiences nausea, vomiting, worsening pain, or worsening distention; then, make NPO with sips/ice chips only until seen by MD  Question Answer Comment  Room service appropriate? Yes   Fluid consistency: Thin      09/22/23 0831            DVT prophylaxis: enoxaparin (LOVENOX) injection 40 mg Start: 09/22/23 0800 SCD's Start: 09/21/23 1223   Lab Results  Component Value Date   PLT 279 09/22/2023      Code Status: Limited: Do not attempt resuscitation (DNR) -DNR-LIMITED -Do Not Intubate/DNI   Family Communication: niece at bedside   Status is: Inpatient Remains inpatient appropriate because: severity of illness  Level of care: Med-Surg  Consultants:  General  surgery Gastroenterology   Objective: Vitals:   09/22/23 0900 09/22/23 1000 09/22/23 1100 09/22/23 1200  BP:  (!) 122/48  135/66  Pulse: 81 79 77 79  Resp: 17 15 17  (!) 26  Temp:    97.6 F (36.4 C)  TempSrc:    Oral  SpO2:   92% 95%  Weight:      Height:        Intake/Output Summary (Last 24 hours) at 09/22/2023 1227 Last data filed at 09/22/2023 0800 Gross per 24 hour  Intake 1904.81 ml  Output 1000 ml  Net 904.81 ml   Wt Readings from Last 3 Encounters:  09/21/23 57.6 kg  08/17/23 55.3 kg  06/18/23 60.9 kg    Examination: Constitutional: NAD Eyes: lids and conjunctivae normal, no scleral icterus ENMT: mmm Neck: normal, supple Respiratory: clear to auscultation bilaterally, no wheezing, no crackles. Cardiovascular: Regular rate and rhythm, no murmurs / rubs / gallops.  Abdomen: soft, no distention, no tenderness. Bowel sounds positive.    Data Reviewed: I have independently reviewed following labs and imaging studies   CBC Recent Labs  Lab 09/18/23 1039 09/18/23 1832 09/19/23 0154 09/19/23  1754 09/20/23 0559 09/21/23 0632 09/22/23 0935  WBC 9.4  --   --   --  8.8 9.7 11.5*  HGB 7.3*   < > 8.2* 8.1* 8.0* 9.4* 10.0*  HCT 25.0*   < > 27.6* 28.2* 27.8* 33.4* 33.8*  PLT 369  --   --   --  289 304 279  MCV 91.6  --   --   --  93.6 95.7 92.1  MCH 26.7  --   --   --  26.9 26.9 27.2  MCHC 29.2*  --   --   --  28.8* 28.1* 29.6*  RDW 18.3*  --   --   --  18.0* 17.6* 18.5*   < > = values in this interval not displayed.    Recent Labs  Lab 09/18/23 1039 09/19/23 0154 09/20/23 0559 09/21/23 0632 09/22/23 0935  NA 141 140 138 137 136  K 3.9 4.0 3.4* 3.3* 3.3*  CL 104 106 104 103 100  CO2 29 26 25 23 26   GLUCOSE 97 102* 91 119* 110*  BUN 15 15 14 12 12   CREATININE 0.81 0.76 0.81 0.94 0.95  CALCIUM 9.5 9.0 8.7* 8.9 8.5*  AST 19  --  11*  --  13*  ALT 5  --  7  --  9  ALKPHOS 46  --  45  --  37*  BILITOT 0.7  --  1.6*  --  1.0  ALBUMIN 3.3*  --  2.7*   --  2.7*  MG  --   --  2.0 1.9 1.8    ------------------------------------------------------------------------------------------------------------------ No results for input(s): "CHOL", "HDL", "LDLCALC", "TRIG", "CHOLHDL", "LDLDIRECT" in the last 72 hours.  No results found for: "HGBA1C" ------------------------------------------------------------------------------------------------------------------ No results for input(s): "TSH", "T4TOTAL", "T3FREE", "THYROIDAB" in the last 72 hours.  Invalid input(s): "FREET3"  Cardiac Enzymes No results for input(s): "CKMB", "TROPONINI", "MYOGLOBIN" in the last 168 hours.  Invalid input(s): "CK" ------------------------------------------------------------------------------------------------------------------    Component Value Date/Time   BNP 897.0 (H) 08/13/2023 1146    CBG: No results for input(s): "GLUCAP" in the last 168 hours.  Recent Results (from the past 240 hours)  Surgical pcr screen     Status: None   Collection Time: 09/20/23  5:59 PM   Specimen: Nasal Mucosa; Nasal Swab  Result Value Ref Range Status   MRSA, PCR NEGATIVE NEGATIVE Final   Staphylococcus aureus NEGATIVE NEGATIVE Final    Comment: (NOTE) The Xpert SA Assay (FDA approved for NASAL specimens in patients 84 years of age and older), is one component of a comprehensive surveillance program. It is not intended to diagnose infection nor to guide or monitor treatment. Performed at [facility], [address], [place], [place] [address]      Radiology Studies: No results found.    Pamella Pert, MD, PhD [facility]  Between 7 am - 7 pm I am available, please contact me via Amion (for emergencies) or Securechat (non urgent messages)  Between 7 pm - 7 am I am not available, please contact night coverage MD/APP via Amion

## 2023-09-23 ENCOUNTER — Inpatient Hospital Stay (HOSPITAL_COMMUNITY): Payer: Medicare HMO

## 2023-09-23 ENCOUNTER — Ambulatory Visit: Payer: Medicare HMO | Admitting: Cardiology

## 2023-09-23 ENCOUNTER — Other Ambulatory Visit: Payer: Self-pay | Admitting: *Deleted

## 2023-09-23 ENCOUNTER — Encounter: Payer: Self-pay | Admitting: *Deleted

## 2023-09-23 DIAGNOSIS — C180 Malignant neoplasm of cecum: Secondary | ICD-10-CM

## 2023-09-23 LAB — CBC
HCT: 37.4 % (ref 36.0–46.0)
Hemoglobin: 10.9 g/dL — ABNORMAL LOW (ref 12.0–15.0)
MCH: 27.1 pg (ref 26.0–34.0)
MCHC: 29.1 g/dL — ABNORMAL LOW (ref 30.0–36.0)
MCV: 93 fL (ref 80.0–100.0)
Platelets: 321 10*3/uL (ref 150–400)
RBC: 4.02 MIL/uL (ref 3.87–5.11)
RDW: 18.2 % — ABNORMAL HIGH (ref 11.5–15.5)
WBC: 14.7 10*3/uL — ABNORMAL HIGH (ref 4.0–10.5)
nRBC: 0 % (ref 0.0–0.2)

## 2023-09-23 LAB — COMPREHENSIVE METABOLIC PANEL
ALT: 10 U/L (ref 0–44)
AST: 12 U/L — ABNORMAL LOW (ref 15–41)
Albumin: 2.8 g/dL — ABNORMAL LOW (ref 3.5–5.0)
Alkaline Phosphatase: 39 U/L (ref 38–126)
Anion gap: 12 (ref 5–15)
BUN: 15 mg/dL (ref 8–23)
CO2: 27 mmol/L (ref 22–32)
Calcium: 9 mg/dL (ref 8.9–10.3)
Chloride: 98 mmol/L (ref 98–111)
Creatinine, Ser: 0.94 mg/dL (ref 0.44–1.00)
GFR, Estimated: 58 mL/min — ABNORMAL LOW (ref >60)
Glucose, Bld: 114 mg/dL — ABNORMAL HIGH (ref 70–99)
Potassium: 3.8 mmol/L (ref 3.5–5.1)
Sodium: 137 mmol/L (ref 135–145)
Total Bilirubin: 1 mg/dL (ref 0.0–1.2)
Total Protein: 5.6 g/dL — ABNORMAL LOW (ref 6.5–8.1)

## 2023-09-23 LAB — SURGICAL PATHOLOGY

## 2023-09-23 MED ORDER — APIXABAN 2.5 MG PO TABS
2.5000 mg | ORAL_TABLET | Freq: Two times a day (BID) | ORAL | Status: DC
Start: 2023-09-23 — End: 2023-09-26
  Administered 2023-09-23 – 2023-09-26 (×6): 2.5 mg via ORAL
  Filled 2023-09-23 (×6): qty 1

## 2023-09-23 MED ORDER — IOHEXOL 300 MG/ML  SOLN
100.0000 mL | Freq: Once | INTRAMUSCULAR | Status: AC | PRN
  Administered 2023-09-23: 100 mL via INTRAVENOUS

## 2023-09-23 MED ORDER — IOHEXOL 300 MG/ML  SOLN
30.0000 mL | Freq: Once | INTRAMUSCULAR | Status: AC | PRN
  Administered 2023-09-23: 30 mL via ORAL

## 2023-09-23 NOTE — Progress Notes (Signed)
 Oncology Discharge Planning Note  [facility] Address: [address], [place], [place] [address] Hours of Operation:  Lewayne Bunting, Monday - Friday  Clinic Contact Information:  [phone]) [phone]  Oncology Care Team: Medical Oncologist:  Truett Perna   Patient Details: Name:  Mallory Stephens, Mallory Stephens MRN:   [id number] DOB:   September 14, 1932 Reason for Current Admission: @PPROB @  Discharge Planning Narrative: Notification of admission received by inaptient team for Mallory Stephens.  Discharge follow-up appointments for oncology are current and available on the AVS and MyChart.   Upon discharge from the hospital, hematology/oncology's post discharge plan of care for the outpatient setting is:  October 08, 2023 at 1:40pm [facility] [address] [place], [place] [address]  [phone]  Mallory Stephens will be called within two business days after discharge to review hematology/oncology's plan of care for full understanding.    Outpatient Oncology Specific Care Only: Oncology appointment transportation needs addressed?:  no Oncology medication management for symptom management addressed?:  not applicable Chemo Alert Card reviewed?:  not applicable Immunotherapy Alert Card reviewed?:  not applicable

## 2023-09-23 NOTE — Progress Notes (Signed)
 2 Days Post-Op  Subjective: CC: Sore only at her incisions with movement. No pain at rest. Well controlled. N/v yesterday when taking meds on empty stomach. Tolerating d1 diet today without n/v. Passing flatus. Non-bloody bm today. Voiding after foley removal. Mobilizing.   Objective: Vital signs in last 24 hours: Temp:  [97.5 F (36.4 C)-98.1 F (36.7 C)] 98 F (36.7 C) (02/19 0922) Pulse Rate:  [79-110] 99 (02/19 0922) Resp:  [16-26] 18 (02/19 0922) BP: (114-154)/(66-95) 146/82 (02/19 0922) SpO2:  [79 %-97 %] 92 % (02/19 0922) Last BM Date : 09/23/23  Intake/Output from previous day: 02/18 0701 - 02/19 0700 In: 1089.9 [P.O.:540; I.V.:3; IV Piggyback:546.9] Out: 1000 [Urine:1000] Intake/Output this shift: Total I/O In: 240 [P.O.:240] Out: -   PE: Gen:  Alert, NAD, pleasant Card:  Reg Pulm:  CTAB, no W/R/R, effort normal Abd: Soft, ND, appropriately ttp around incisions - no rigidity or guarding and otherwise NT. Laparoscopic incisions with gauze and tegarderm in place, cdi. Pfannenstiel incision with honeycomb dressing in place, cdi.   Lab Results:  Recent Labs    09/22/23 0935 09/23/23 0509  WBC 11.5* 14.7*  HGB 10.0* 10.9*  HCT 33.8* 37.4  PLT 279 321   BMET Recent Labs    09/22/23 0935 09/23/23 0509  NA 136 137  K 3.3* 3.8  CL 100 98  CO2 26 27  GLUCOSE 110* 114*  BUN 12 15  CREATININE 0.95 0.94  CALCIUM 8.5* 9.0   PT/INR No results for input(s): "LABPROT", "INR" in the last 72 hours. CMP     Component Value Date/Time   NA 137 09/23/2023 0509   NA 142 09/17/2021 1024   K 3.8 09/23/2023 0509   CL 98 09/23/2023 0509   CO2 27 09/23/2023 0509   GLUCOSE 114 (H) 09/23/2023 0509   BUN 15 09/23/2023 0509   BUN 20 09/17/2021 1024   CREATININE 0.94 09/23/2023 0509   CREATININE 1.11 (H) 09/14/2023 1052   CREATININE 1.32 (H) 08/09/2015 1122   CALCIUM 9.0 09/23/2023 0509   PROT 5.6 (L) 09/23/2023 0509   PROT 6.5 06/01/2019 0958   ALBUMIN 2.8 (L)  09/23/2023 0509   ALBUMIN 4.2 06/01/2019 0958   AST 12 (L) 09/23/2023 0509   AST 9 (L) 09/14/2023 1052   ALT 10 09/23/2023 0509   ALT 5 09/14/2023 1052   ALKPHOS 39 09/23/2023 0509   BILITOT 1.0 09/23/2023 0509   BILITOT 0.4 09/14/2023 1052   GFRNONAA 58 (L) 09/23/2023 0509   GFRNONAA 48 (L) 09/14/2023 1052   GFRAA 44 (L) 02/22/2020 1041   Lipase     Component Value Date/Time   LIPASE 14 09/18/2023 1039    Studies/Results: X-ray chest PA or AP Result Date: 09/21/2023 CLINICAL DATA:  [id number] Central venous catheter in place, temporary [id number] EXAM: CHEST  1 VIEW COMPARISON:  Radiographs 09/20/2023 and 08/14/2023. Abdominal CTA 09/18/2023. FINDINGS: 1121 hours. Right IJ central venous catheter projects to the lower SVC level. Stable cardiomegaly and aortic atherosclerosis. There are increased interstitial opacities throughout both lungs, likely reflecting progressive pulmonary edema. Trace bilateral pleural effusions without evidence of pneumothorax. The bones appear unchanged. IMPRESSION: 1. Right IJ central venous catheter projects to the lower SVC level. No evidence of pneumothorax. 2. Increased interstitial opacities throughout both lungs, likely reflecting progressive pulmonary edema. Electronically Signed   By: Carey Bullocks M.D.   On: 09/21/2023 14:24    Anti-infectives: Anti-infectives (From admission, onward)    Start     Dose/Rate  Route Frequency Ordered Stop   09/21/23 2030  cefoTEtan (CEFOTAN) 2 g in sodium chloride 0.9 % 100 mL IVPB        2 g 200 mL/hr over 30 Minutes Intravenous Every 12 hours 09/21/23 1222 09/21/23 2228   09/21/23 0600  cefoTEtan (CEFOTAN) 2 g in sodium chloride 0.9 % 100 mL IVPB        2 g 200 mL/hr over 30 Minutes Intravenous On call to O.R. 09/20/23 1112 09/21/23 1247   09/20/23 1400  neomycin (MYCIFRADIN) tablet 1,000 mg       Placed in "And" Linked Group   1,000 mg Oral 3 times per day 09/20/23 1112 09/20/23 2146   09/20/23 1400   metroNIDAZOLE (FLAGYL) tablet 1,000 mg       Placed in "And" Linked Group   1,000 mg Oral 3 times per day 09/20/23 1112 09/20/23 2146        Assessment/Plan POD 2 s/p lap assisted R colectomy by Dr. 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 CEA 10.9. She has been set up for GI tumor board. Recommend ensuring she has CT CAP during admission to r/o mets and touching base with oncology to ensure she has f/u.  - Hgb stable. Okay to resume anticoagulation - Mobilize, PT/OT rec SNF - Pulm toilet.   FEN - Soft, IVF per primary  VTE - SCDs, Eliquis for hx of A. Fib ID - Cefotetan peri-op, none currently.  Foley - None, spont void   LOS: 5 days    Jacinto Halim, [facility] Surgery 09/23/2023, 11:00 AM Please see Amion for pager number during day hours 7:00am-4:30pm

## 2023-09-23 NOTE — Progress Notes (Signed)
 Mobility Specialist - Progress Note   09/23/23 0920  Mobility  Activity Ambulated with assistance in hallway  Level of Assistance Standby assist, set-up cues, supervision of patient - no hands on  Assistive Device Front wheel walker  Distance Ambulated (ft) 120 ft  Activity Response Tolerated well  Mobility Referral Yes  Mobility visit 1 Mobility  Mobility Specialist Start Time (ACUTE ONLY) 0907  Mobility Specialist Stop Time (ACUTE ONLY) 0920  Mobility Specialist Time Calculation (min) (ACUTE ONLY) 13 min   Pt received in bed and agreeable to mobility. Pt was minA from supine>sitting. No complaints during session. Pt to bed after session with all needs met.    [facility]

## 2023-09-23 NOTE — Progress Notes (Signed)
 Pharmacy Brief Note - Alvimopan (Entereg)  The standing order set for alvimopan (Entereg) now includes an automatic order to discontinue the drug after the patient has had a bowel movement. The change was approved by the Pharmacy & Therapeutics Committee and the Medical Executive Committee.   This patient has had bowel movements documented by nursing. Therefore, alvimopan has been discontinued. If there are questions, please contact the pharmacy at [phone].   Thank you- Cherylin Mylar, PharmD Clinical Pharmacist  2/19/20257:52 AM

## 2023-09-23 NOTE — Progress Notes (Signed)
 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

## 2023-09-23 NOTE — Progress Notes (Addendum)
 PROGRESS NOTE    Mallory Stephens  WUJ:[id number] DOB: 1933/06/16 DOA: 09/18/2023 PCP: Mila Palmer, MD  Brief Narrative:89 year old female with history of breast cancer, HTN, permanent A-fib on Eliquis comes into the hospital with anemia, abdominal discomfort as well as maroon stools. She has a history of iron deficiency anemia for which she has been worked up by hematology without a clear cause. Over the last couple of days prior to admission has noticed that her stools were maroon and has been having abdominal discomfort. She discussed with her hematologist, and eventually presented to the ER with concerns for GI bleed. CT scan on admission raised concern about an irregular mass in the cecum/ascending colon, concerning for colonic malignancy. 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  Imaging on admission concerning for colonic malignancy in the cecum/ascending colon.  GI and general surgery consulted, she was taken to the OR 2/17 by Dr. Michaell Cowing, and is status post laparoscopic right colectomy with anastomosis -Advance diet per general surgery. Will obtain CT chest abdomen and pelvis to complete workup Will discuss with Dr. Georgiann Mohs OOB Ambulate   Active problems Chronic diastolic CHF-continue Lasix stable on room air    Essential hypertension continue metoprolol and Lasix.  Patient had few low blood pressure  readings for which she was started on midodrine which has now been stopped as she is more on the hypertensive side.     Permanent atrial fibrillation-continue metoprolol, restarting Eliquis 09/23/2023   history of breast cancer-follows with Dr. Georgiann Mohs, continue anastrozole   Mood d/o -continue paroxetine   Hyperlipidemia-continue statin   Estimated body mass index is 21.13 kg/m as calculated from the following:   Height as of this encounter: 5\' 5"  (1.651 m).   Weight as of this encounter: 57.6 kg.  DVT prophylaxis: Eliquis  code Status: DNR  family Communication: None  disposition Plan:  Status is: Inpatient Remains inpatient appropriate because: Acute illness   Consultants:  General surgery  Procedures: Status post laparoscopic right colectomy Dr. Michaell Cowing 09/21/2023 Antimicrobials: None   Subjective: Resting in bed when I saw her earlier in the morning she had not had a BM she denied nausea vomiting patient eventually had a BM today.  Objective: Vitals:   09/22/23 2120 09/23/23 0245 09/23/23 0639 09/23/23 0922  BP: 135/82 114/73 (!) 154/70 (!) 146/82  Pulse: (!) 102 (!) 110 (!) 105 99  Resp: 18 16 18 18   Temp: 97.7 F (36.5 C) 98.1 F (36.7 C) 98 F (36.7 C) 98 F (36.7 C)  TempSrc: Oral Oral Oral Oral  SpO2: 93% 90% 92% 92%  Weight:      Height:        Intake/Output Summary (Last 24 hours) at 09/23/2023 1133 Last data filed at 09/23/2023 9562 Gross per 24 hour  Intake 783 ml  Output --  Net 783 ml   Filed Weights   09/18/23 1037 09/21/23 1244  Weight: 56.7 kg 57.6 kg    Examination:  General exam: Appears calm and comfortable  Respiratory system: Clear to auscultation. Respiratory effort normal. Cardiovascular system: S1 & S2 heard, RRR. No JVD, murmurs, rubs, gallops or clicks. No pedal edema. Gastrointestinal system: Abdomen is nondistended, soft and tender. No organomegaly or masses felt. Normal bowel sounds heard. Central nervous system: Alert and  oriented. No focal neurological deficits. Extremities: no edema    Data Reviewed: I have personally reviewed following labs and imaging studies  CBC: Recent Labs  Lab 09/18/23 1039 09/18/23 1832 09/19/23 1754 09/20/23 0559 09/21/23 0632 09/22/23 0935 09/23/23 0509  WBC 9.4  --   --  8.8 9.7 11.5* 14.7*  HGB 7.3*   < > 8.1* 8.0* 9.4* 10.0* 10.9*  HCT 25.0*   < > 28.2* 27.8* 33.4* 33.8* 37.4  MCV 91.6  --   --  93.6 95.7 92.1 93.0  PLT 369  --   --  289 304 279 321   < > = values in this interval not displayed.   Basic Metabolic Panel: Recent Labs  Lab 09/19/23 0154 09/20/23 0559 09/21/23 0632 09/22/23 0935 09/23/23 0509  NA 140 138 137 136 137  K 4.0 3.4* 3.3* 3.3* 3.8  CL 106 104 103 100 98  CO2 26 25 23 26 27   GLUCOSE 102* 91 119* 110* 114*  BUN 15 14 12 12 15   CREATININE 0.76 0.81 0.94 0.95 0.94  CALCIUM 9.0 8.7* 8.9 8.5* 9.0  MG  --  2.0 1.9 1.8  --    GFR: Estimated Creatinine Clearance: 37.2 mL/min (by C-G formula based on SCr of 0.94 mg/dL). Liver Function Tests: Recent Labs  Lab 09/18/23 1039 09/20/23 0559 09/22/23 0935 09/23/23 0509  AST 19 11* 13* 12*  ALT 5 7 9 10   ALKPHOS 46 45 37* 39  BILITOT 0.7 1.6* 1.0 1.0  PROT 5.8* 5.4* 5.4* 5.6*  ALBUMIN 3.3* 2.7* 2.7* 2.8*   Recent Labs  Lab 09/18/23 1039  LIPASE 14   No results for input(s): "AMMONIA" in the last 168 hours. Coagulation Profile: No results for input(s): "INR", "PROTIME" in the last 168 hours. Cardiac Enzymes: No results for input(s): "CKTOTAL", "CKMB", "CKMBINDEX", "TROPONINI" in the last 168 hours. BNP (last 3 results) No results for input(s): "PROBNP" in the last 8760 hours. HbA1C: No results for input(s): "HGBA1C" in the last 72 hours. CBG: No results for input(s): "GLUCAP" in the last 168 hours. Lipid Profile: No results for input(s): "CHOL", "HDL", "LDLCALC", "TRIG", "CHOLHDL", "LDLDIRECT" in the last 72 hours. Thyroid Function Tests: No results for input(s): "TSH",  "T4TOTAL", "FREET4", "T3FREE", "THYROIDAB" in the last 72 hours. Anemia Panel: No results for input(s): "VITAMINB12", "FOLATE", "FERRITIN", "TIBC", "IRON", "RETICCTPCT" in the last 72 hours. Sepsis Labs: No results for input(s): "PROCALCITON", "LATICACIDVEN" in the last 168 hours.  Recent Results (from the past 240 hours)  Surgical pcr screen     Status: None   Collection Time: 09/20/23  5:59 PM   Specimen: Nasal Mucosa; Nasal Swab  Result Value Ref Range Status   MRSA, PCR NEGATIVE NEGATIVE Final   Staphylococcus aureus NEGATIVE NEGATIVE Final    Comment: (NOTE) The Xpert SA Assay (FDA approved for NASAL specimens in patients 55 years of age and older), is one component of a comprehensive surveillance program. It is not intended to diagnose infection nor to guide or monitor treatment. Performed at [facility], [address], [place], [place] [address]  Radiology Studies: No results found.   Scheduled Meds:  sodium chloride   Intravenous Once   acetaminophen  1,000 mg Oral Q6H   anastrozole  1 mg Oral q AM   apixaban  2.5 mg Oral BID   Chlorhexidine Gluconate Cloth  6 each Topical Daily   cholecalciferol  1,000 Units Oral Daily   famotidine  40 mg Oral QHS   feeding supplement  237 mL Oral BID BM   fesoterodine  4 mg Oral Daily   furosemide  40 mg Oral Daily   gabapentin  200 mg Oral QHS   metoprolol tartrate  25 mg Oral BID   PARoxetine  10 mg Oral BH-q7a   polycarbophil  625 mg Oral BID   pravastatin  20 mg Oral QHS   sodium chloride flush  3 mL Intravenous Q12H   Continuous Infusions:  sodium chloride     lactated ringers Stopped (09/22/23 0024)     LOS: 5 days    Time spent:  Alwyn Ren, MD 09/23/2023, 11:33 AM

## 2023-09-24 ENCOUNTER — Encounter: Payer: Self-pay | Admitting: *Deleted

## 2023-09-24 DIAGNOSIS — C180 Malignant neoplasm of cecum: Secondary | ICD-10-CM | POA: Diagnosis not present

## 2023-09-24 NOTE — [place] FL2 (Signed)
 [place] MEDICAID FL2 LEVEL OF CARE FORM     IDENTIFICATION  Patient Name: Mallory Stephens Birthdate: Dec 16, 1932 Sex: female Admission Date (Current Location): 09/18/2023  [facility] and [place] Number:  Producer, television/film/video and Address:  [facility],  [address] 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 09/21/2023 09/22/2023   History of falling 09/22/2023   Lower GI bleeding 09/18/2023   Heart failure (HCC) 08/14/2023   Acute CHF (congestive heart failure) (HCC) 08/13/2023   Hypokalemia 10/25/2022   Chronic anemia 10/25/2022   Acute on chronic diastolic CHF (congestive heart failure) (HCC) 10/25/2022   Genetic testing 11/26/2020   History of basal cell carcinoma    Family history of breast cancer    Family history of prostate cancer    Malignant neoplasm of upper-inner quadrant of left breast in female, 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/2020   Presbycusis of both ears 03/11/2018   Hypokalemia 04/29/2017   Leukocytosis 04/29/2017   Syncope 04/29/2017   UTI (urinary tract infection) 04/28/2017   Fall 04/28/2017   Nausea and vomiting 04/28/2017   Generalized weakness 04/28/2017   Constipation 04/28/2017   Diverticulitis    Edema of extremities 09/03/2016   Dyslipidemia    Permanent atrial fibrillation (HCC) 07/08/2013   Essential hypertension, benign 07/08/2013   Long term (current) use of anticoagulants 07/08/2013   Encounter for long-term (current) use of other medications 07/08/2013    Varicose veins of both legs with edema 03/04/2004    Orientation RESPIRATION BLADDER Height & Weight     Self, Time, Situation, Place  Normal Continent Weight: 126 lb 15.8 oz (57.6 kg) Height:  5\' 5"  (165.1 cm)  BEHAVIORAL SYMPTOMS/MOOD NEUROLOGICAL BOWEL NUTRITION STATUS      Continent Diet (regular)  AMBULATORY STATUS COMMUNICATION OF NEEDS Skin   Limited Assist Verbally Other (Comment) (surgical incision only)                       Personal Care Assistance Level of Assistance  Bathing, Dressing Bathing Assistance: Limited assistance   Dressing Assistance: Limited assistance     Functional Limitations Info  Sight, Hearing, Speech Sight Info: Adequate Hearing Info: Adequate Speech Info: Adequate    SPECIAL CARE FACTORS FREQUENCY  PT (By licensed PT), OT (By licensed OT)     PT Frequency: 5x/wk OT Frequency: 5x/wk            Contractures Contractures Info: Not present    Additional Factors Info  Code Status, Allergies Code Status Info: DNR Allergies Info: Sulfa Antibiotics, Sulfa Drugs Cross Reactors           Current Medications (09/24/2023):  This is the current hospital active medication list Current Facility-Administered Medications  Medication Dose Route Frequency Provider Last Rate Last Admin   0.9 %  sodium chloride infusion  250 mL Intravenous PRN Karie Soda, MD       acetaminophen (TYLENOL) tablet 1,000 mg  1,000 mg Oral Trecia Rogers, MD   1,000  mg at 09/24/23 0936   albuterol (PROVENTIL) (2.5 MG/3ML) 0.083% nebulizer solution 2.5 mg  2.5 mg Nebulization Q2H PRN Karie Soda, MD       alum & mag hydroxide-simeth (MAALOX/MYLANTA) 200-200-20 MG/5ML suspension 30 mL  30 mL Oral Q6H PRN Karie Soda, MD   30 mL at 09/23/23 0258   anastrozole (ARIMIDEX) tablet 1 mg  1 mg Oral q AM Karie Soda, MD   1 mg at 09/24/23 1610   apixaban (ELIQUIS) tablet 2.5 mg  2.5 mg Oral BID Alwyn Ren, MD   2.5 mg at 09/24/23 9604   benzocaine  (ORAJEL) 10 % mucosal gel   Mouth/Throat QID PRN Karie Soda, MD       Chlorhexidine Gluconate Cloth 2 % PADS 6 each  6 each Topical Daily Karie Soda, MD   6 each at 09/24/23 [phone]   cholecalciferol (VITAMIN D3) 25 MCG (1000 UNIT) tablet 1,000 Units  1,000 Units Oral Daily Karie Soda, MD   1,000 Units at 09/24/23 0936   famotidine (PEPCID) tablet 40 mg  40 mg Oral Laurena Slimmer, MD   40 mg at 09/23/23 2137   feeding supplement (ENSURE SURGERY) liquid 237 mL  237 mL Oral BID BM Karie Soda, MD   237 mL at 09/24/23 0947   fesoterodine (TOVIAZ) tablet 4 mg  4 mg Oral Daily Karie Soda, MD   4 mg at 09/24/23 8119   furosemide (LASIX) tablet 40 mg  40 mg Oral Daily Karie Soda, MD   40 mg at 09/24/23 1478   gabapentin (NEURONTIN) capsule 200 mg  200 mg Oral Laurena Slimmer, MD   200 mg at 09/23/23 2137   hydrALAZINE (APRESOLINE) injection 10 mg  10 mg Intravenous Q2H PRN Karie Soda, MD   10 mg at 09/21/23 1405   HYDROmorphone (DILAUDID) injection 0.5-1 mg  0.5-1 mg Intravenous Q4H PRN Karie Soda, MD       magic mouthwash  15 mL Oral QID PRN Karie Soda, MD       melatonin tablet 3 mg  3 mg Oral QHS PRN Karie Soda, MD       menthol-cetylpyridinium (CEPACOL) lozenge 3 mg  1 lozenge Oral PRN Karie Soda, MD       methocarbamol (ROBAXIN) injection 1,000 mg  1,000 mg Intravenous Q6H PRN Karie Soda, MD       methocarbamol (ROBAXIN) tablet 1,000 mg  1,000 mg Oral Q6H PRN Karie Soda, MD   500 mg at 09/23/23 0256   metoprolol tartrate (LOPRESSOR) injection 5 mg  5 mg Intravenous Q6H PRN Karie Soda, MD       metoprolol tartrate (LOPRESSOR) tablet 25 mg  25 mg Oral BID Karie Soda, MD   25 mg at 09/24/23 0937   ondansetron (ZOFRAN) tablet 4 mg  4 mg Oral Q6H PRN Karie Soda, MD       Or   ondansetron [facility]) injection 4 mg  4 mg Intravenous Q6H PRN Karie Soda, MD   4 mg at 09/23/23 2140   PARoxetine (PAXIL) tablet 10 mg  10 mg Oral Krystal Clark, MD   10 mg  at 09/24/23 0936   phenol (CHLORASEPTIC) mouth spray 2 spray  2 spray Mouth/Throat PRN Karie Soda, MD       polycarbophil (FIBERCON) tablet 625 mg  625 mg Oral BID Karie Soda, MD   625 mg at 09/24/23 2956   polyvinyl alcohol (LIQUIFILM TEARS) 1.4 % ophthalmic solution 1-2 drop  1-2 drop Both  Eyes Q6H PRN Karie Soda, MD       pravastatin (PRAVACHOL) tablet 20 mg  20 mg Oral Laurena Slimmer, MD   20 mg at 09/23/23 2137   prochlorperazine (COMPAZINE) tablet 10 mg  10 mg Oral Q6H PRN Karie Soda, MD       Or   prochlorperazine (COMPAZINE) injection 5-10 mg  5-10 mg Intravenous Q6H PRN Karie Soda, MD       simethicone (MYLICON) 40 MG/0.6ML suspension 80 mg  80 mg Oral QID PRN Karie Soda, MD       sodium chloride flush (NS) 0.9 % injection 3 mL  3 mL Intravenous Catha Gosselin, MD   3 mL at 09/24/23 0948   sodium chloride flush (NS) 0.9 % injection 3 mL  3 mL Intravenous PRN Karie Soda, MD       traMADol Janean Sark) tablet 50-100 mg  50-100 mg Oral Q6H PRN Karie Soda, MD       Facility-Administered Medications Ordered in Other Encounters  Medication Dose Route Frequency Provider Last Rate Last Admin   0.9 %  sodium chloride infusion   Intravenous Continuous Rachel Moulds, MD 10 mL/hr at 08/12/23 1427 New Bag at 08/12/23 1427     Discharge Medications: Please see discharge summary for a list of discharge medications.  Relevant Imaging Results:  Relevant Lab Results:   Additional Information SS# [id number]  Amada Jupiter, LCSW

## 2023-09-24 NOTE — Progress Notes (Signed)
 Physical Therapy Treatment Patient Details Name: Mallory Stephens MRN: [id number] DOB: October 29, 1932 Today's Date: 09/24/2023   History of Present Illness 89 year old female comes into the hospital with anemia, abdominal discomfort as well as maroon stools, S/P partial colectomy 09/21/23 . YQM:VHQI history of breast cancer, HTN, permanent A-fib on Eliquis    PT Comments  Pt very cooperative and with noted improvement in stability and activity tolerance including up to ambulate in hall and up to bathroom for toileting with hand hygiene standing at sink.  However, pt continues to require assist for all basic mobility tasks and will benefit from continued inpatient follow up therapy, <3 hours/day prior to return home with limited assist of elderly sister.     If plan is discharge home, recommend the following: A little help with walking and/or transfers;Assistance with cooking/housework;Assist for transportation;Help with stairs or ramp for entrance   Can travel by private vehicle     No  Equipment Recommendations  None recommended by PT    Recommendations for Other Services       Precautions / Restrictions Precautions Precautions: Fall Restrictions Weight Bearing Restrictions Per Provider Order: No     Mobility  Bed Mobility Overal bed mobility: Needs Assistance Bed Mobility: Rolling, Sidelying to Sit Rolling: Contact guard assist Sidelying to sit: Min assist       General bed mobility comments: cues for technique with assist to bring trunk to upright    Transfers Overall transfer level: Needs assistance Equipment used: Rolling walker (2 wheels) Transfers: Sit to/from Stand Sit to Stand: Min assist, Mod assist           General transfer comment: cues for use of UEs to self assist; increased assist from comode vs EOB    Ambulation/Gait Ambulation/Gait assistance: Min assist Gait Distance (Feet): 90 Feet (and 15' into bathroom) Assistive device: Rolling walker (2  wheels) Gait Pattern/deviations: Step-to pattern, Step-through pattern, Decreased step length - right, Decreased step length - left, Shuffle, Trunk flexed Gait velocity: decr     General Gait Details: Cues for posture and position from RW; distance ltd by fatigue   Stairs             Wheelchair Mobility     Tilt Bed    Modified Rankin (Stroke Patients Only)       Balance Overall balance assessment: Needs assistance Sitting-balance support: Single extremity supported, No upper extremity supported Sitting balance-Leahy Scale: Good     Standing balance support: Single extremity supported Standing balance-Leahy Scale: Poor                              Communication Communication Communication: No apparent difficulties  Cognition Arousal: Alert Behavior During Therapy: WFL for tasks assessed/performed                           PT - Cognition Comments: follows directions, cues to stay on task Following commands: Intact      Cueing Cueing Techniques: Verbal cues, Gestural cues  Exercises      General Comments        Pertinent Vitals/Pain Pain Assessment Pain Assessment: 0-10 Faces Pain Scale: Hurts a little bit Pain Location: abd. Pain Descriptors / Indicators: Aching, Sore Pain Intervention(s): Limited activity within patient's tolerance, Monitored during session, Premedicated before session    Home Living  Prior Function            PT Goals (current goals can now be found in the care plan section) Acute Rehab PT Goals Patient Stated Goal: go to rehab PT Goal Formulation: With patient Time For Goal Achievement: 10/06/23 Potential to Achieve Goals: Good Progress towards PT goals: Progressing toward goals    Frequency    Min 1X/week      PT Plan      Co-evaluation              AM-PAC PT "6 Clicks" Mobility   Outcome Measure  Help needed turning from your back to your  side while in a flat bed without using bedrails?: A Little Help needed moving from lying on your back to sitting on the side of a flat bed without using bedrails?: A Little Help needed moving to and from a bed to a chair (including a wheelchair)?: A Lot Help needed standing up from a chair using your arms (e.g., wheelchair or bedside chair)?: A Little Help needed to walk in hospital room?: A Lot Help needed climbing 3-5 steps with a railing? : A Lot 6 Click Score: 15    End of Session Equipment Utilized During Treatment: Gait belt Activity Tolerance: Patient limited by fatigue Patient left: in chair;with chair alarm set;with call bell/phone within reach;with family/visitor present Nurse Communication: Mobility status PT Visit Diagnosis: Unsteadiness on feet (R26.81);Muscle weakness (generalized) (M62.81);Difficulty in walking, not elsewhere classified (R26.2);Pain     Time: 1610-9604 PT Time Calculation (min) (ACUTE ONLY): 25 min  Charges:    $Gait Training: 8-22 mins $Therapeutic Activity: 8-22 mins PT General Charges $$ ACUTE PT VISIT: 1 Visit                     [name] PT Acute Rehabilitation Services Pager [phone] Office [phone]    [facility] 09/24/2023, 12:47 PM

## 2023-09-24 NOTE — Progress Notes (Signed)
 PROGRESS NOTE    Mallory Stephens  WUJ:[id number] DOB: Jun 23, 1933 DOA: 09/18/2023 PCP: Mila Palmer, MD  Brief Narrative:89 year old female with history of breast cancer, HTN, permanent A-fib on Eliquis comes into the hospital with anemia, abdominal discomfort as well as maroon stools. She has a history of iron deficiency anemia for which she has been worked up by hematology without a clear cause. Over the last couple of days prior to admission has noticed that her stools were maroon and has been having abdominal discomfort. She discussed with her hematologist, and eventually presented to the ER with concerns for GI bleed. CT scan on admission raised concern about an irregular mass in the cecum/ascending colon, concerning for colonic malignancy. 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  Imaging on admission concerning for colonic malignancy in the cecum/ascending colon.  GI and general surgery consulted, she was taken to the OR 2/17 by Dr. Michaell Cowing, and is status post laparoscopic right colectomy with anastomosis -Advance diet per general surgery. CT chest abdomen pelvis no evidence of metastasis Encourage patient to be out of bed She will follow-up with oncology Dr. Truett Perna on discharge   Active problems Chronic diastolic CHF-continue Lasix stable on room air    Essential hypertension continue metoprolol and Lasix.   Patient had few low blood pressure readings for which she was started on midodrine which has now been stopped as she is more on the hypertensive side.     Permanent atrial fibrillation-continue metoprolol, restarting Eliquis 09/23/2023   history of breast cancer-follows with Dr. Georgiann Mohs, continue anastrozole   Mood d/o -continue paroxetine   Hyperlipidemia-continue statin   Estimated body mass index is 21.13 kg/m as calculated from the following:   Height as of this encounter: 5\' 5"  (1.651 m).   Weight as of this encounter: 57.6 kg.  DVT prophylaxis: Eliquis  code Status: DNR  family Communication: None  disposition Plan:  Status is: Inpatient Remains inpatient appropriate because: Acute illness   Consultants:  General surgery  Procedures: Status post laparoscopic right colectomy Dr. Michaell Cowing 09/21/2023 Antimicrobials: None   Subjective: She is resting in bed no complaints other than generalized weakness   objective: Vitals:   09/23/23 0922 09/23/23 1342 09/23/23 2115 09/24/23 0606  BP: (!) 146/82 (!) 126/54 124/67 (!) 151/76  Pulse: 99 80 93 86  Resp: 18 16 16 17   Temp: 98 F (36.7 C) 98.2 F (36.8 C) 97.6 F (36.4 C) (!) 97.5 F (36.4 C)  TempSrc: Oral Oral Oral Oral  SpO2: 92%  91% 94%  Weight:      Height:        Intake/Output Summary (Last 24 hours) at 09/24/2023 1306 Last data filed at 09/24/2023 0946 Gross per 24 hour  Intake 843 ml  Output --  Net 843 ml   Filed Weights   09/18/23 1037 09/21/23 1244  Weight: 56.7 kg 57.6 kg  Examination:  General exam: Appears calm and comfortable  Respiratory system: Clear to auscultation. Respiratory effort normal. Cardiovascular system: S1 & S2 heard, RRR. No JVD, murmurs, rubs, gallops or clicks. No pedal edema. Gastrointestinal system: Abdomen is nondistended, soft and tender. No organomegaly or masses felt. Normal bowel sounds heard. Central nervous system: Alert and oriented. No focal neurological  deficits. Extremities: no edema    Data Reviewed: I have personally reviewed following labs and imaging studies  CBC: Recent Labs  Lab 09/18/23 1039 09/18/23 1832 09/19/23 1754 09/20/23 0559 09/21/23 0632 09/22/23 0935 09/23/23 0509  WBC 9.4  --   --  8.8 9.7 11.5* 14.7*  HGB 7.3*   < > 8.1* 8.0* 9.4* 10.0* 10.9*  HCT 25.0*   < > 28.2* 27.8* 33.4* 33.8* 37.4  MCV 91.6  --   --  93.6 95.7 92.1 93.0  PLT 369  --   --  289 304 279 321   < > = values in this interval not displayed.   Basic Metabolic Panel: Recent Labs  Lab 09/19/23 0154 09/20/23 0559 09/21/23 0632 09/22/23 0935 09/23/23 0509  NA 140 138 137 136 137  K 4.0 3.4* 3.3* 3.3* 3.8  CL 106 104 103 100 98  CO2 26 25 23 26 27   GLUCOSE 102* 91 119* 110* 114*  BUN 15 14 12 12 15   CREATININE 0.76 0.81 0.94 0.95 0.94  CALCIUM 9.0 8.7* 8.9 8.5* 9.0  MG  --  2.0 1.9 1.8  --    GFR: Estimated Creatinine Clearance: 37.2 mL/min (by C-G formula based on SCr of 0.94 mg/dL). Liver Function Tests: Recent Labs  Lab 09/18/23 1039 09/20/23 0559 09/22/23 0935 09/23/23 0509  AST 19 11* 13* 12*  ALT 5 7 9 10   ALKPHOS 46 45 37* 39  BILITOT 0.7 1.6* 1.0 1.0  PROT 5.8* 5.4* 5.4* 5.6*  ALBUMIN 3.3* 2.7* 2.7* 2.8*   Recent Labs  Lab 09/18/23 1039  LIPASE 14   No results for input(s): "AMMONIA" in the last 168 hours. Coagulation Profile: No results for input(s): "INR", "PROTIME" in the last 168 hours. Cardiac Enzymes: No results for input(s): "CKTOTAL", "CKMB", "CKMBINDEX", "TROPONINI" in the last 168 hours. BNP (last 3 results) No results for input(s): "PROBNP" in the last 8760 hours. HbA1C: No results for input(s): "HGBA1C" in the last 72 hours. CBG: No results for input(s): "GLUCAP" in the last 168 hours. Lipid Profile: No results for input(s): "CHOL", "HDL", "LDLCALC", "TRIG", "CHOLHDL", "LDLDIRECT" in the last 72 hours. Thyroid Function Tests: No results for input(s): "TSH", "T4TOTAL", "FREET4", "T3FREE",  "THYROIDAB" in the last 72 hours. Anemia Panel: No results for input(s): "VITAMINB12", "FOLATE", "FERRITIN", "TIBC", "IRON", "RETICCTPCT" in the last 72 hours. Sepsis Labs: No results for input(s): "PROCALCITON", "LATICACIDVEN" in the last 168 hours.  Recent Results (from the past 240 hours)  Surgical pcr screen     Status: None   Collection Time: 09/20/23  5:59 PM   Specimen: Nasal Mucosa; Nasal Swab  Result Value Ref Range Status   MRSA, PCR NEGATIVE NEGATIVE Final   Staphylococcus aureus NEGATIVE NEGATIVE Final    Comment: (NOTE) The Xpert SA Assay (FDA approved for NASAL specimens in patients 62 years of age and older), is one component of a comprehensive surveillance program. It is not intended to diagnose infection nor to guide or monitor treatment. Performed at [facility], [address], [place], [place] [address]      Radiology Studies: CT CHEST ABDOMEN PELVIS W  CONTRAST Result Date: 09/23/2023 CLINICAL DATA:  Colon cancer staging, status post partial right hemicolectomy * Tracking Code: BO * EXAM: CT CHEST, ABDOMEN, AND PELVIS WITH CONTRAST TECHNIQUE: Multidetector CT imaging of the chest, abdomen and pelvis was performed following the standard protocol during bolus administration of intravenous contrast. RADIATION DOSE REDUCTION: This exam was performed according to the departmental dose-optimization program which includes automated exposure control, adjustment of the mA and/or kV according to patient size and/or use of iterative reconstruction technique. CONTRAST:  30mL OMNIPAQUE IOHEXOL 300 MG/ML SOLN, OMNIPAQUE IOHEXOL 300 MG/ML SOLN additional oral enteric contrast COMPARISON:  CT angiogram abdomen pelvis, 09/18/2023 FINDINGS: CT CHEST FINDINGS Cardiovascular: Aortic atherosclerosis. Cardiomegaly. No pericardial effusion. Mediastinum/Nodes: Benign, calcified granulomatous right hilar and subcarinal lymph nodes. No suspicious mediastinal, hilar, or  axillary lymph nodes. Thyroid gland, trachea, and esophagus demonstrate no significant findings. Lungs/Pleura: Small bilateral pleural effusions. Unchanged mild pulmonary fibrosis with bibasilar predominant distribution, assessment limited in the presence of effusions. Subpleural radiation fibrosis of the anterior left lung. Benign, calcified right lower lobe granulomatous lymph node. Musculoskeletal: Status post left lumpectomy. No acute osseous findings. CT ABDOMEN PELVIS FINDINGS Hepatobiliary: No focal liver abnormality is seen. Status post cholecystectomy. Unchanged postoperative biliary ductal dilatation. Pancreas: Unremarkable. No pancreatic ductal dilatation or surrounding inflammatory changes. Spleen: Normal in size without significant abnormality. Adrenals/Urinary Tract: Adrenal glands are unremarkable. Kidneys are normal, without renal calculi, solid lesion, or hydronephrosis. Bladder is unremarkable. Stomach/Bowel: Stomach is within normal limits. Status post interval partial right hemicolectomy and reanastomosis. Pancolonic diverticulosis. Enteric contrast present to the transverse colon, with fluid present to the rectum. Vascular/Lymphatic: Aortic atherosclerosis. No enlarged abdominal or pelvic lymph nodes. Reproductive: No mass or other abnormality. Other: Subcutaneous emphysema related to laparoscopic access. Anasarca. Trace ascites and postoperative pneumoperitoneum. Musculoskeletal: No acute osseous findings. Unchanged lower thoracic and lumbar wedge deformities of T11 and L1. IMPRESSION: 1. Status post interval partial right hemicolectomy and reanastomosis. Colon is filled with enteric contrast and fluid to the rectum. Assessment for intraluminal hemorrhage is very limited in the setting of enteric contrast; consider dedicated GI bleed protocol CT following adequate time for contrast passage if there is persistent concern for bleeding. 2. No evidence of lymphadenopathy or metastatic disease in  the chest, abdomen, or pelvis. 3. Trace simple fluid attenuation ascites and postoperative pneumoperitoneum. 4. Small pleural effusions and anasarca. 5. Cardiomegaly. Aortic Atherosclerosis (ICD10-I70.0). Electronically Signed   By: Jearld Lesch M.D.   On: 09/23/2023 17:59     Scheduled Meds:  acetaminophen  1,000 mg Oral Q6H   anastrozole  1 mg Oral q AM   apixaban  2.5 mg Oral BID   Chlorhexidine Gluconate Cloth  6 each Topical Daily   cholecalciferol  1,000 Units Oral Daily   famotidine  40 mg Oral QHS   feeding supplement  237 mL Oral BID BM   fesoterodine  4 mg Oral Daily   furosemide  40 mg Oral Daily   gabapentin  200 mg Oral QHS   metoprolol tartrate  25 mg Oral BID   PARoxetine  10 mg Oral BH-q7a   polycarbophil  625 mg Oral BID   pravastatin  20 mg Oral QHS   sodium chloride flush  3 mL Intravenous Q12H   Continuous Infusions:  sodium chloride       LOS: 6 days    Time spent:  Alwyn Ren, MD 09/24/2023, 1:06 PM

## 2023-09-24 NOTE — TOC Progression Note (Signed)
 Transition of Care [facility]) - Progression Note    Patient Details  Name: TORUNN CHANCELLOR MRN: [id number] Date of Birth: May 19, 1933  [facility] CM/SW Contact  Amada Jupiter, LCSW Phone Number: 09/24/2023, 2:11 PM  Clinical Narrative:     Met with pt today to review therapy recommendations for SNF rehab.  Pt very pleasant and agreeable with plan.  She asks that I update her niece, Darcus Austin - VM left for her and awaiting return call. Will begin SNF bed search.       Expected Discharge Plan and Services                                               Social Determinants of Health (SDOH) Interventions SDOH Screenings   Food Insecurity: No Food Insecurity (09/18/2023)  Housing: Low Risk  (09/18/2023)  Transportation Needs: No Transportation Needs (09/18/2023)  Utilities: Not At Risk (09/18/2023)  Social Connections: Socially Isolated (09/18/2023)  Tobacco Use: Medium Risk (09/18/2023)    Readmission Risk Interventions    09/20/2023    9:48 AM 10/28/2022    4:35 PM  Readmission Risk Prevention Plan  Transportation Screening Complete Complete  PCP or Specialist Appt within 5-7 Days  Complete  PCP or Specialist Appt within 3-5 Days Complete   Home Care Screening  Complete  Medication Review (RN CM)  Referral to Pharmacy  HRI or Home Care Consult Complete   Social Work Consult for Recovery Care Planning/Counseling Complete

## 2023-09-24 NOTE — Progress Notes (Signed)
 Request sent to Pathology for MSI testing on accession number [id number]

## 2023-09-24 NOTE — Progress Notes (Signed)
 3 Days Post-Op  Subjective: CC: Family at bedside. Sore only at her incisions with movement. No pain at rest. Well controlled. Tolerated pork chops for dinner yesterday without n/v. Passing flatus. Non-bloody bm yesterday. Voiding. Mobilizing.   Objective: Vital signs in last 24 hours: Temp:  [97.5 F (36.4 C)-98.2 F (36.8 C)] 97.5 F (36.4 C) (02/20 0606) Pulse Rate:  [80-93] 86 (02/20 0606) Resp:  [16-17] 17 (02/20 0606) BP: (124-151)/(54-76) 151/76 (02/20 0606) SpO2:  [91 %-94 %] 94 % (02/20 0606) Last BM Date : 09/23/23  Intake/Output from previous day: 02/19 0701 - 02/20 0700 In: 603 [P.O.:600; I.V.:3] Out: -  Intake/Output this shift: Total I/O In: 480 [P.O.:480] Out: -   PE: Gen:  Alert, NAD, pleasant Card:  Reg Pulm:  CTAB, no W/R/R, effort normal Abd: Soft, ND, appropriately ttp around incisions - no rigidity or guarding and otherwise NT. Laparoscopic incisions and Pfannenstiel incision with glue intact appears well and are without drainage, bleeding, or signs of infection   Lab Results:  Recent Labs    09/22/23 0935 09/23/23 0509  WBC 11.5* 14.7*  HGB 10.0* 10.9*  HCT 33.8* 37.4  PLT 279 321   BMET Recent Labs    09/22/23 0935 09/23/23 0509  NA 136 137  K 3.3* 3.8  CL 100 98  CO2 26 27  GLUCOSE 110* 114*  BUN 12 15  CREATININE 0.95 0.94  CALCIUM 8.5* 9.0   PT/INR No results for input(s): "LABPROT", "INR" in the last 72 hours. CMP     Component Value Date/Time   NA 137 09/23/2023 0509   NA 142 09/17/2021 1024   K 3.8 09/23/2023 0509   CL 98 09/23/2023 0509   CO2 27 09/23/2023 0509   GLUCOSE 114 (H) 09/23/2023 0509   BUN 15 09/23/2023 0509   BUN 20 09/17/2021 1024   CREATININE 0.94 09/23/2023 0509   CREATININE 1.11 (H) 09/14/2023 1052   CREATININE 1.32 (H) 08/09/2015 1122   CALCIUM 9.0 09/23/2023 0509   PROT 5.6 (L) 09/23/2023 0509   PROT 6.5 06/01/2019 0958   ALBUMIN 2.8 (L) 09/23/2023 0509   ALBUMIN 4.2 06/01/2019 0958    AST 12 (L) 09/23/2023 0509   AST 9 (L) 09/14/2023 1052   ALT 10 09/23/2023 0509   ALT 5 09/14/2023 1052   ALKPHOS 39 09/23/2023 0509   BILITOT 1.0 09/23/2023 0509   BILITOT 0.4 09/14/2023 1052   GFRNONAA 58 (L) 09/23/2023 0509   GFRNONAA 48 (L) 09/14/2023 1052   GFRAA 44 (L) 02/22/2020 1041   Lipase     Component Value Date/Time   LIPASE 14 09/18/2023 1039    Studies/Results: CT CHEST ABDOMEN PELVIS W CONTRAST Result Date: 09/23/2023 CLINICAL DATA:  Colon cancer staging, status post partial right hemicolectomy * Tracking Code: BO * EXAM: CT CHEST, ABDOMEN, AND PELVIS WITH CONTRAST TECHNIQUE: Multidetector CT imaging of the chest, abdomen and pelvis was performed following the standard protocol during bolus administration of intravenous contrast. RADIATION DOSE REDUCTION: This exam was performed according to the departmental dose-optimization program which includes automated exposure control, adjustment of the mA and/or kV according to patient size and/or use of iterative reconstruction technique. CONTRAST:  30mL OMNIPAQUE IOHEXOL 300 MG/ML SOLN, OMNIPAQUE IOHEXOL 300 MG/ML SOLN additional oral enteric contrast COMPARISON:  CT angiogram abdomen pelvis, 09/18/2023 FINDINGS: CT CHEST FINDINGS Cardiovascular: Aortic atherosclerosis. Cardiomegaly. No pericardial effusion. Mediastinum/Nodes: Benign, calcified granulomatous right hilar and subcarinal lymph nodes. No suspicious mediastinal, hilar, or axillary lymph nodes.  Thyroid gland, trachea, and esophagus demonstrate no significant findings. Lungs/Pleura: Small bilateral pleural effusions. Unchanged mild pulmonary fibrosis with bibasilar predominant distribution, assessment limited in the presence of effusions. Subpleural radiation fibrosis of the anterior left lung. Benign, calcified right lower lobe granulomatous lymph node. Musculoskeletal: Status post left lumpectomy. No acute osseous findings. CT ABDOMEN PELVIS FINDINGS Hepatobiliary: No  focal liver abnormality is seen. Status post cholecystectomy. Unchanged postoperative biliary ductal dilatation. Pancreas: Unremarkable. No pancreatic ductal dilatation or surrounding inflammatory changes. Spleen: Normal in size without significant abnormality. Adrenals/Urinary Tract: Adrenal glands are unremarkable. Kidneys are normal, without renal calculi, solid lesion, or hydronephrosis. Bladder is unremarkable. Stomach/Bowel: Stomach is within normal limits. Status post interval partial right hemicolectomy and reanastomosis. Pancolonic diverticulosis. Enteric contrast present to the transverse colon, with fluid present to the rectum. Vascular/Lymphatic: Aortic atherosclerosis. No enlarged abdominal or pelvic lymph nodes. Reproductive: No mass or other abnormality. Other: Subcutaneous emphysema related to laparoscopic access. Anasarca. Trace ascites and postoperative pneumoperitoneum. Musculoskeletal: No acute osseous findings. Unchanged lower thoracic and lumbar wedge deformities of T11 and L1. IMPRESSION: 1. Status post interval partial right hemicolectomy and reanastomosis. Colon is filled with enteric contrast and fluid to the rectum. Assessment for intraluminal hemorrhage is very limited in the setting of enteric contrast; consider dedicated GI bleed protocol CT following adequate time for contrast passage if there is persistent concern for bleeding. 2. No evidence of lymphadenopathy or metastatic disease in the chest, abdomen, or pelvis. 3. Trace simple fluid attenuation ascites and postoperative pneumoperitoneum. 4. Small pleural effusions and anasarca. 5. Cardiomegaly. Aortic Atherosclerosis (ICD10-I70.0). Electronically Signed   By: Jearld Lesch M.D.   On: 09/23/2023 17:59    Anti-infectives: Anti-infectives (From admission, onward)    Start     Dose/Rate Route Frequency Ordered Stop   09/21/23 2030  cefoTEtan (CEFOTAN) 2 g in sodium chloride 0.9 % 100 mL IVPB        2 g 200 mL/hr over 30  Minutes Intravenous Every 12 hours 09/21/23 1222 09/21/23 2228   09/21/23 0600  cefoTEtan (CEFOTAN) 2 g in sodium chloride 0.9 % 100 mL IVPB        2 g 200 mL/hr over 30 Minutes Intravenous On call to O.R. 09/20/23 1112 09/21/23 1247   09/20/23 1400  neomycin (MYCIFRADIN) tablet 1,000 mg       Placed in "And" Linked Group   1,000 mg Oral 3 times per day 09/20/23 1112 09/20/23 2146   09/20/23 1400  metroNIDAZOLE (FLAGYL) tablet 1,000 mg       Placed in "And" Linked Group   1,000 mg Oral 3 times per day 09/20/23 1112 09/20/23 2146        Assessment/Plan POD 3 s/p lap assisted R colectomy by Dr. Michaell Cowing on 09/21/23 - Tolerating soft diet and having bowel function.  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 CEA 10.9. CT CAP without obvious mets. She has been set up for GI tumor board. She is being set up to see Dr. Truett Perna of Oncology.  - Hgb stable after resuming anticoagulation - Mobilize, PT/OT rec SNF - Pulm toilet.  - Okay for d/c to SNF from our standpoint.   FEN - Soft, IVF per primary  VTE - SCDs, Eliquis for hx of A. Fib ID - Cefotetan peri-op, none currently.  Foley - None, spont void   LOS: 6 days    Jacinto Halim, [facility] Surgery 09/24/2023, 11:02 AM Please see Amion  for pager number during day hours 7:00am-4:30pm

## 2023-09-25 LAB — COMPREHENSIVE METABOLIC PANEL
ALT: 10 U/L (ref 0–44)
AST: 13 U/L — ABNORMAL LOW (ref 15–41)
Albumin: 2.9 g/dL — ABNORMAL LOW (ref 3.5–5.0)
Alkaline Phosphatase: 52 U/L (ref 38–126)
Anion gap: 9 (ref 5–15)
BUN: 19 mg/dL (ref 8–23)
CO2: 29 mmol/L (ref 22–32)
Calcium: 9.3 mg/dL (ref 8.9–10.3)
Chloride: 97 mmol/L — ABNORMAL LOW (ref 98–111)
Creatinine, Ser: 0.86 mg/dL (ref 0.44–1.00)
GFR, Estimated: >60 mL/min (ref >60)
Glucose, Bld: 101 mg/dL — ABNORMAL HIGH (ref 70–99)
Potassium: 3.6 mmol/L (ref 3.5–5.1)
Sodium: 135 mmol/L (ref 135–145)
Total Bilirubin: 0.7 mg/dL (ref 0.0–1.2)
Total Protein: 6.2 g/dL — ABNORMAL LOW (ref 6.5–8.1)

## 2023-09-25 LAB — CBC
HCT: 37.7 % (ref 36.0–46.0)
Hemoglobin: 11.1 g/dL — ABNORMAL LOW (ref 12.0–15.0)
MCH: 26.8 pg (ref 26.0–34.0)
MCHC: 29.4 g/dL — ABNORMAL LOW (ref 30.0–36.0)
MCV: 91.1 fL (ref 80.0–100.0)
Platelets: 382 10*3/uL (ref 150–400)
RBC: 4.14 MIL/uL (ref 3.87–5.11)
RDW: 17.2 % — ABNORMAL HIGH (ref 11.5–15.5)
WBC: 12.3 10*3/uL — ABNORMAL HIGH (ref 4.0–10.5)
nRBC: 0 % (ref 0.0–0.2)

## 2023-09-25 NOTE — Progress Notes (Signed)
 Occupational Therapy Treatment Patient Details Name: Mallory Stephens MRN: [id number] DOB: 11/03/1932 Today's Date: 09/25/2023   History of present illness 89 year old female comes into the hospital with anemia, abdominal discomfort as well as maroon stools, S/P partial colectomy 09/21/23 . WGN:FAOZ history of breast cancer, HTN, permanent A-fib on Eliquis   OT comments  Pt making good progress with functional goal. Pt very pleasant and cooperative. OT will continue to follow acutely to maximize level of function and safety      If plan is discharge home, recommend the following:  A lot of help with bathing/dressing/bathroom;A little help with walking and/or transfers;Assistance with cooking/housework;Help with stairs or ramp for entrance   Equipment Recommendations  None recommended by OT    Recommendations for Other Services      Precautions / Restrictions Precautions Precautions: Fall Recall of Precautions/Restrictions: Intact Restrictions Weight Bearing Restrictions Per Provider Order: No       Mobility Bed Mobility               General bed mobility comments: pt in chair    Transfers Overall transfer level: Needs assistance Equipment used: Rolling walker (2 wheels) Transfers: Sit to/from Stand Sit to Stand: Min assist                 Balance Overall balance assessment: Needs assistance Sitting-balance support: Single extremity supported, No upper extremity supported Sitting balance-Leahy Scale: Good     Standing balance support: Single extremity supported, During functional activity Standing balance-Leahy Scale: Poor                             ADL either performed or assessed with clinical judgement   ADL Overall ADL's : Needs assistance/impaired     Grooming: Wash/dry hands;Wash/dry face;Contact guard assist;Standing   Upper Body Bathing: Set up;Supervision/ safety;Sitting Upper Body Bathing Details (indicate cue type and  reason): simulated Lower Body Bathing: Minimal assistance;Sitting/lateral leans Lower Body Bathing Details (indicate cue type and reason): simulated Upper Body Dressing : Contact guard assist;Standing       Toilet Transfer: Minimal assistance;Ambulation;Rolling walker (2 wheels);Cueing for safety;Regular Toilet;Grab bars   Toileting- Clothing Manipulation and Hygiene: Minimal assistance;Sit to/from stand       Functional mobility during ADLs: Minimal assistance;Rolling walker (2 wheels);Cueing for safety      Extremity/Trunk Assessment Upper Extremity Assessment Upper Extremity Assessment: Overall WFL for tasks assessed   Lower Extremity Assessment Lower Extremity Assessment: Defer to PT evaluation   Cervical / Trunk Assessment Cervical / Trunk Assessment: Kyphotic    Vision Baseline Vision/History: 1 Wears glasses Ability to See in Adequate Light: 0 Adequate Patient Visual Report: No change from baseline     Perception     Praxis     Communication Communication Communication: No apparent difficulties   Cognition Arousal: Alert Behavior During Therapy: WFL for tasks assessed/performed Cognition: No apparent impairments                               Following commands: Intact        Cueing   Cueing Techniques: Verbal cues, Gestural cues  Exercises      Shoulder Instructions       General Comments      Pertinent Vitals/ Pain       Pain Assessment Pain Assessment: Faces Faces Pain Scale: Hurts a little bit Pain Location: ABD Pain Descriptors /  Indicators: Aching, Sore Pain Intervention(s): Monitored during session, Repositioned  Home Living                                          Prior Functioning/Environment              Frequency           Progress Toward Goals  OT Goals(current goals can now be found in the care plan section)  Progress towards OT goals: Progressing toward goals     Plan       Co-evaluation                 AM-PAC OT "6 Clicks" Daily Activity     Outcome Measure   Help from another person eating meals?: None Help from another person taking care of personal grooming?: A Little Help from another person toileting, which includes using toliet, bedpan, or urinal?: A Little Help from another person bathing (including washing, rinsing, drying)?: A Lot Help from another person to put on and taking off regular upper body clothing?: A Little Help from another person to put on and taking off regular lower body clothing?: A Lot 6 Click Score: 17    End of Session Equipment Utilized During Treatment: Gait belt;Rolling walker (2 wheels)  OT Visit Diagnosis: Unsteadiness on feet (R26.81);Other abnormalities of gait and mobility (R26.89)   Activity Tolerance     Patient Left with call bell/phone within reach;in chair;with bed alarm set   Nurse Communication          Time: 1610-9604 OT Time Calculation (min): 16 min  Charges: OT General Charges $OT Visit: 1 Visit OT Treatments $Self Care/Home Management : 8-22 mins    Galen Manila 09/25/2023, 3:17 PM

## 2023-09-25 NOTE — Progress Notes (Signed)
 4 Days Post-Op  Subjective: CC: Patient reports no abdominal pain.  Did not require any as needed pain medications yesterday or today.  Tolerating soft diet without n/v. Passing flatus. Non-bloody bm yesterday but did not some blood when she wiped. CBC pending. Voiding. Mobilizing.   Objective: Vital signs in last 24 hours: Temp:  [97.8 F (36.6 C)-98.7 F (37.1 C)] 97.8 F (36.6 C) (02/21 0609) Pulse Rate:  [93-99] 94 (02/21 0609) Resp:  [17-18] 18 (02/21 0609) BP: (138-163)/(56-89) 163/70 (02/21 0609) SpO2:  [94 %-98 %] 94 % (02/21 0609) Last BM Date : 09/24/23  Intake/Output from previous day: 02/20 0701 - 02/21 0700 In: 1693 [P.O.:1690; I.V.:3] Out: 250 [Urine:250] Intake/Output this shift: No intake/output data recorded.  PE: Gen:  Alert, NAD, pleasant Abd: Soft, ND, appropriately ttp around incisions - no rigidity or guarding and otherwise NT. Laparoscopic incisions and Pfannenstiel incision with glue intact appears well and are without drainage, bleeding, or signs of infection   Lab Results:  Recent Labs    09/23/23 0509  WBC 14.7*  HGB 10.9*  HCT 37.4  PLT 321   BMET Recent Labs    09/23/23 0509  NA 137  K 3.8  CL 98  CO2 27  GLUCOSE 114*  BUN 15  CREATININE 0.94  CALCIUM 9.0   PT/INR No results for input(s): "LABPROT", "INR" in the last 72 hours. CMP     Component Value Date/Time   NA 137 09/23/2023 0509   NA 142 09/17/2021 1024   K 3.8 09/23/2023 0509   CL 98 09/23/2023 0509   CO2 27 09/23/2023 0509   GLUCOSE 114 (H) 09/23/2023 0509   BUN 15 09/23/2023 0509   BUN 20 09/17/2021 1024   CREATININE 0.94 09/23/2023 0509   CREATININE 1.11 (H) 09/14/2023 1052   CREATININE 1.32 (H) 08/09/2015 1122   CALCIUM 9.0 09/23/2023 0509   PROT 5.6 (L) 09/23/2023 0509   PROT 6.5 06/01/2019 0958   ALBUMIN 2.8 (L) 09/23/2023 0509   ALBUMIN 4.2 06/01/2019 0958   AST 12 (L) 09/23/2023 0509   AST 9 (L) 09/14/2023 1052   ALT 10 09/23/2023 0509   ALT 5  09/14/2023 1052   ALKPHOS 39 09/23/2023 0509   BILITOT 1.0 09/23/2023 0509   BILITOT 0.4 09/14/2023 1052   GFRNONAA 58 (L) 09/23/2023 0509   GFRNONAA 48 (L) 09/14/2023 1052   GFRAA 44 (L) 02/22/2020 1041   Lipase     Component Value Date/Time   LIPASE 14 09/18/2023 1039    Studies/Results: CT CHEST ABDOMEN PELVIS W CONTRAST Result Date: 09/23/2023 CLINICAL DATA:  Colon cancer staging, status post partial right hemicolectomy * Tracking Code: BO * EXAM: CT CHEST, ABDOMEN, AND PELVIS WITH CONTRAST TECHNIQUE: Multidetector CT imaging of the chest, abdomen and pelvis was performed following the standard protocol during bolus administration of intravenous contrast. RADIATION DOSE REDUCTION: This exam was performed according to the departmental dose-optimization program which includes automated exposure control, adjustment of the mA and/or kV according to patient size and/or use of iterative reconstruction technique. CONTRAST:  30mL OMNIPAQUE IOHEXOL 300 MG/ML SOLN, OMNIPAQUE IOHEXOL 300 MG/ML SOLN additional oral enteric contrast COMPARISON:  CT angiogram abdomen pelvis, 09/18/2023 FINDINGS: CT CHEST FINDINGS Cardiovascular: Aortic atherosclerosis. Cardiomegaly. No pericardial effusion. Mediastinum/Nodes: Benign, calcified granulomatous right hilar and subcarinal lymph nodes. No suspicious mediastinal, hilar, or axillary lymph nodes. Thyroid gland, trachea, and esophagus demonstrate no significant findings. Lungs/Pleura: Small bilateral pleural effusions. Unchanged mild pulmonary fibrosis with bibasilar predominant  distribution, assessment limited in the presence of effusions. Subpleural radiation fibrosis of the anterior left lung. Benign, calcified right lower lobe granulomatous lymph node. Musculoskeletal: Status post left lumpectomy. No acute osseous findings. CT ABDOMEN PELVIS FINDINGS Hepatobiliary: No focal liver abnormality is seen. Status post cholecystectomy. Unchanged postoperative biliary  ductal dilatation. Pancreas: Unremarkable. No pancreatic ductal dilatation or surrounding inflammatory changes. Spleen: Normal in size without significant abnormality. Adrenals/Urinary Tract: Adrenal glands are unremarkable. Kidneys are normal, without renal calculi, solid lesion, or hydronephrosis. Bladder is unremarkable. Stomach/Bowel: Stomach is within normal limits. Status post interval partial right hemicolectomy and reanastomosis. Pancolonic diverticulosis. Enteric contrast present to the transverse colon, with fluid present to the rectum. Vascular/Lymphatic: Aortic atherosclerosis. No enlarged abdominal or pelvic lymph nodes. Reproductive: No mass or other abnormality. Other: Subcutaneous emphysema related to laparoscopic access. Anasarca. Trace ascites and postoperative pneumoperitoneum. Musculoskeletal: No acute osseous findings. Unchanged lower thoracic and lumbar wedge deformities of T11 and L1. IMPRESSION: 1. Status post interval partial right hemicolectomy and reanastomosis. Colon is filled with enteric contrast and fluid to the rectum. Assessment for intraluminal hemorrhage is very limited in the setting of enteric contrast; consider dedicated GI bleed protocol CT following adequate time for contrast passage if there is persistent concern for bleeding. 2. No evidence of lymphadenopathy or metastatic disease in the chest, abdomen, or pelvis. 3. Trace simple fluid attenuation ascites and postoperative pneumoperitoneum. 4. Small pleural effusions and anasarca. 5. Cardiomegaly. Aortic Atherosclerosis (ICD10-I70.0). Electronically Signed   By: Jearld Lesch M.D.   On: 09/23/2023 17:59    Anti-infectives: Anti-infectives (From admission, onward)    Start     Dose/Rate Route Frequency Ordered Stop   09/21/23 2030  cefoTEtan (CEFOTAN) 2 g in sodium chloride 0.9 % 100 mL IVPB        2 g 200 mL/hr over 30 Minutes Intravenous Every 12 hours 09/21/23 1222 09/21/23 2228   09/21/23 0600  cefoTEtan  (CEFOTAN) 2 g in sodium chloride 0.9 % 100 mL IVPB        2 g 200 mL/hr over 30 Minutes Intravenous On call to O.R. 09/20/23 1112 09/21/23 1247   09/20/23 1400  neomycin (MYCIFRADIN) tablet 1,000 mg       Placed in "And" Linked Group   1,000 mg Oral 3 times per day 09/20/23 1112 09/20/23 2146   09/20/23 1400  metroNIDAZOLE (FLAGYL) tablet 1,000 mg       Placed in "And" Linked Group   1,000 mg Oral 3 times per day 09/20/23 1112 09/20/23 2146        Assessment/Plan POD 4 s/p lap assisted R colectomy by Dr. Michaell Cowing on 09/21/23 - Tolerating soft diet and having bowel function.  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 CEA 10.9. CT CAP without obvious mets. She has been set up for GI tumor board. She is being set up to see Dr. Truett Perna of Oncology.  - Back on anticoagulation. Noted some blood when she wiped yesterday - cbc pending.  - Mobilize, PT/OT rec SNF - Pulm toilet.  - Would like to ensure hgb stable. Otherwise working towards d/c to SNF   FEN - Soft, IVF per primary  VTE - SCDs, Eliquis for hx of A. Fib ID - Cefotetan peri-op, none currently.  Foley - None, spont void   LOS: 7 days    Jacinto Halim, [facility] 09/25/2023, 10:47 AM Please see Amion for pager number during day hours 7:00am-4:30pm

## 2023-09-25 NOTE — Progress Notes (Signed)
 PROGRESS NOTE    Mallory Stephens  ZOX:[id number] DOB: 1932-09-28 DOA: 09/18/2023 PCP: Mila Palmer, MD  Brief Narrative:89 year old female with history of breast cancer, HTN, permanent A-fib on Eliquis comes into the hospital with anemia, abdominal discomfort as well as maroon stools. She has a history of iron deficiency anemia for which she has been worked up by hematology without a clear cause. Over the last couple of days prior to admission has noticed that her stools were maroon and has been having abdominal discomfort. She discussed with her hematologist, and eventually presented to the ER with concerns for GI bleed. CT scan on admission raised concern about an irregular mass in the cecum/ascending colon, concerning for colonic malignancy. 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  Imaging on admission concerning for colonic malignancy in the cecum/ascending colon.  GI and general surgery consulted, she was taken to the OR 2/17 by Dr. Michaell Cowing, and is status post laparoscopic right colectomy with anastomosis -Advance diet per general surgery. CT chest abdomen pelvis no evidence of metastasis Encourage patient to be out of bed She will follow-up with oncology Dr. Truett Perna on discharge   Active problems Chronic diastolic CHF-continue Lasix stable on room air    Essential hypertension continue metoprolol and Lasix.   Patient had few low blood pressure readings for which she was started on midodrine which has now been stopped as she is more on the hypertensive side.     Permanent atrial fibrillation-continue metoprolol, restarted Eliquis 09/23/2023   history of breast cancer-follows with Dr. Georgiann Mohs, continue anastrozole   Mood d/o -continue paroxetine   Hyperlipidemia-continue statin   Estimated body mass index is 21.13 kg/m as calculated from the following:   Height as of this encounter: 5\' 5"  (1.651 m).   Weight as of this encounter: 57.6 kg.  DVT prophylaxis: Eliquis  code Status: DNR  family Communication: None  disposition Plan:  Status is: Inpatient Remains inpatient appropriate because: Acute illness   Consultants:  General surgery  Procedures: Status post laparoscopic right colectomy Dr. Michaell Cowing 09/21/2023 Antimicrobials: None   Subjective: She is resting in bed no complaints other than generalized weakness   objective: Vitals:   09/24/23 0606 09/24/23 1312 09/24/23 2045 09/25/23 0609  BP: (!) 151/76 (!) 138/56 (!) 155/89 (!) 163/70  Pulse: 86 93 99 94  Resp: 17 17 18 18   Temp: (!) 97.5 F (36.4 C) 98.5 F (36.9 C) 98.7 F (37.1 C) 97.8 F (36.6 C)  TempSrc: Oral Oral Oral Oral  SpO2: 94% 96% 98% 94%  Weight:      Height:        Intake/Output Summary (Last 24 hours) at 09/25/2023 1030 Last data filed at 09/25/2023 0614 Gross per 24 hour  Intake 1213 ml  Output 250 ml  Net 963 ml   Filed Weights   09/18/23 1037 09/21/23 1244  Weight: 56.7 kg 57.6 kg  Examination:  General exam: Appears calm and comfortable  Respiratory system: Clear to auscultation. Respiratory effort normal. Cardiovascular system: S1 & S2 heard, RRR. No JVD, murmurs, rubs, gallops or clicks. No pedal edema. Gastrointestinal system: Abdomen is nondistended, soft and tender. No organomegaly or masses felt. Normal bowel sounds heard. Central nervous system: Alert and oriented. No focal  neurological deficits. Extremities: no edema    Data Reviewed: I have personally reviewed following labs and imaging studies  CBC: Recent Labs  Lab 09/18/23 1039 09/18/23 1832 09/19/23 1754 09/20/23 0559 09/21/23 0632 09/22/23 0935 09/23/23 0509  WBC 9.4  --   --  8.8 9.7 11.5* 14.7*  HGB 7.3*   < > 8.1* 8.0* 9.4* 10.0* 10.9*  HCT 25.0*   < > 28.2* 27.8* 33.4* 33.8* 37.4  MCV 91.6  --   --  93.6 95.7 92.1 93.0  PLT 369  --   --  289 304 279 321   < > = values in this interval not displayed.   Basic Metabolic Panel: Recent Labs  Lab 09/19/23 0154 09/20/23 0559 09/21/23 0632 09/22/23 0935 09/23/23 0509  NA 140 138 137 136 137  K 4.0 3.4* 3.3* 3.3* 3.8  CL 106 104 103 100 98  CO2 26 25 23 26 27   GLUCOSE 102* 91 119* 110* 114*  BUN 15 14 12 12 15   CREATININE 0.76 0.81 0.94 0.95 0.94  CALCIUM 9.0 8.7* 8.9 8.5* 9.0  MG  --  2.0 1.9 1.8  --    GFR: Estimated Creatinine Clearance: 37.2 mL/min (by C-G formula based on SCr of 0.94 mg/dL). Liver Function Tests: Recent Labs  Lab 09/18/23 1039 09/20/23 0559 09/22/23 0935 09/23/23 0509  AST 19 11* 13* 12*  ALT 5 7 9 10   ALKPHOS 46 45 37* 39  BILITOT 0.7 1.6* 1.0 1.0  PROT 5.8* 5.4* 5.4* 5.6*  ALBUMIN 3.3* 2.7* 2.7* 2.8*   Recent Labs  Lab 09/18/23 1039  LIPASE 14   No results for input(s): "AMMONIA" in the last 168 hours. Coagulation Profile: No results for input(s): "INR", "PROTIME" in the last 168 hours. Cardiac Enzymes: No results for input(s): "CKTOTAL", "CKMB", "CKMBINDEX", "TROPONINI" in the last 168 hours. BNP (last 3 results) No results for input(s): "PROBNP" in the last 8760 hours. HbA1C: No results for input(s): "HGBA1C" in the last 72 hours. CBG: No results for input(s): "GLUCAP" in the last 168 hours. Lipid Profile: No results for input(s): "CHOL", "HDL", "LDLCALC", "TRIG", "CHOLHDL", "LDLDIRECT" in the last 72 hours. Thyroid Function Tests: No results for input(s): "TSH", "T4TOTAL",  "FREET4", "T3FREE", "THYROIDAB" in the last 72 hours. Anemia Panel: No results for input(s): "VITAMINB12", "FOLATE", "FERRITIN", "TIBC", "IRON", "RETICCTPCT" in the last 72 hours. Sepsis Labs: No results for input(s): "PROCALCITON", "LATICACIDVEN" in the last 168 hours.  Recent Results (from the past 240 hours)  Surgical pcr screen     Status: None   Collection Time: 09/20/23  5:59 PM   Specimen: Nasal Mucosa; Nasal Swab  Result Value Ref Range Status   MRSA, PCR NEGATIVE NEGATIVE Final   Staphylococcus aureus NEGATIVE NEGATIVE Final    Comment: (NOTE) The Xpert SA Assay (FDA approved for NASAL specimens in patients 40 years of age and older), is one component of a comprehensive surveillance program. It is not intended to diagnose infection nor to guide or monitor treatment. Performed at [facility], [address] [place], [place] [address]      Radiology Studies: CT CHEST ABDOMEN PELVIS W  CONTRAST Result Date: 09/23/2023 CLINICAL DATA:  Colon cancer staging, status post partial right hemicolectomy * Tracking Code: BO * EXAM: CT CHEST, ABDOMEN, AND PELVIS WITH CONTRAST TECHNIQUE: Multidetector CT imaging of the chest, abdomen and pelvis was performed following the standard protocol during bolus administration of intravenous contrast. RADIATION DOSE REDUCTION: This exam was performed according to the departmental dose-optimization program which includes automated exposure control, adjustment of the mA and/or kV according to patient size and/or use of iterative reconstruction technique. CONTRAST:  30mL OMNIPAQUE IOHEXOL 300 MG/ML SOLN, OMNIPAQUE IOHEXOL 300 MG/ML SOLN additional oral enteric contrast COMPARISON:  CT angiogram abdomen pelvis, 09/18/2023 FINDINGS: CT CHEST FINDINGS Cardiovascular: Aortic atherosclerosis. Cardiomegaly. No pericardial effusion. Mediastinum/Nodes: Benign, calcified granulomatous right hilar and subcarinal lymph nodes. No suspicious  mediastinal, hilar, or axillary lymph nodes. Thyroid gland, trachea, and esophagus demonstrate no significant findings. Lungs/Pleura: Small bilateral pleural effusions. Unchanged mild pulmonary fibrosis with bibasilar predominant distribution, assessment limited in the presence of effusions. Subpleural radiation fibrosis of the anterior left lung. Benign, calcified right lower lobe granulomatous lymph node. Musculoskeletal: Status post left lumpectomy. No acute osseous findings. CT ABDOMEN PELVIS FINDINGS Hepatobiliary: No focal liver abnormality is seen. Status post cholecystectomy. Unchanged postoperative biliary ductal dilatation. Pancreas: Unremarkable. No pancreatic ductal dilatation or surrounding inflammatory changes. Spleen: Normal in size without significant abnormality. Adrenals/Urinary Tract: Adrenal glands are unremarkable. Kidneys are normal, without renal calculi, solid lesion, or hydronephrosis. Bladder is unremarkable. Stomach/Bowel: Stomach is within normal limits. Status post interval partial right hemicolectomy and reanastomosis. Pancolonic diverticulosis. Enteric contrast present to the transverse colon, with fluid present to the rectum. Vascular/Lymphatic: Aortic atherosclerosis. No enlarged abdominal or pelvic lymph nodes. Reproductive: No mass or other abnormality. Other: Subcutaneous emphysema related to laparoscopic access. Anasarca. Trace ascites and postoperative pneumoperitoneum. Musculoskeletal: No acute osseous findings. Unchanged lower thoracic and lumbar wedge deformities of T11 and L1. IMPRESSION: 1. Status post interval partial right hemicolectomy and reanastomosis. Colon is filled with enteric contrast and fluid to the rectum. Assessment for intraluminal hemorrhage is very limited in the setting of enteric contrast; consider dedicated GI bleed protocol CT following adequate time for contrast passage if there is persistent concern for bleeding. 2. No evidence of lymphadenopathy or  metastatic disease in the chest, abdomen, or pelvis. 3. Trace simple fluid attenuation ascites and postoperative pneumoperitoneum. 4. Small pleural effusions and anasarca. 5. Cardiomegaly. Aortic Atherosclerosis (ICD10-I70.0). Electronically Signed   By: Jearld Lesch M.D.   On: 09/23/2023 17:59     Scheduled Meds:  acetaminophen  1,000 mg Oral Q6H   anastrozole  1 mg Oral q AM   apixaban  2.5 mg Oral BID   Chlorhexidine Gluconate Cloth  6 each Topical Daily   cholecalciferol  1,000 Units Oral Daily   famotidine  40 mg Oral QHS   feeding supplement  237 mL Oral BID BM   fesoterodine  4 mg Oral Daily   furosemide  40 mg Oral Daily   gabapentin  200 mg Oral QHS   metoprolol tartrate  25 mg Oral BID   PARoxetine  10 mg Oral [id number]   polycarbophil  625 mg Oral BID   pravastatin  20 mg Oral QHS   sodium chloride flush  3 mL Intravenous Q12H   Continuous Infusions:  sodium chloride       LOS: 7 days    Time spent:  Alwyn Ren, MD 09/25/2023, 10:30 AM

## 2023-09-25 NOTE — TOC Progression Note (Addendum)
 Transition of Care [facility]) - Progression Note    Patient Details  Name: ANALIE KATZMAN MRN: [id number] Date of Birth: 10/23/1932  Transition of Care [facility]) CM/SW Contact  Larrie Kass, LCSW Phone Number: 09/25/2023, 10:14 AM  Clinical Narrative:     CSW presented bed offers to pt and pt's Niece , they are requesting time to review. TOC to follow.   ADDEN 10:30am CSW spoke with pt's niece she has chosen [facility]. CSW to start insurance authorization. TOC to follow.   1:30pm  Pt's insurance [employer] was approved. Pt can d/c to [facility] after 11am tomorrow. Toc to follow.   Expected Discharge Plan: Skilled Nursing Facility Barriers to Discharge: SNF Pending bed offer, Insurance Authorization  Expected Discharge Plan and Services                                               Social Determinants of Health (SDOH) Interventions SDOH Screenings   Food Insecurity: No Food Insecurity (09/18/2023)  Housing: Low Risk  (09/18/2023)  Transportation Needs: No Transportation Needs (09/18/2023)  Utilities: Not At Risk (09/18/2023)  Social Connections: Socially Isolated (09/18/2023)  Tobacco Use: Medium Risk (09/18/2023)    Readmission Risk Interventions    09/20/2023    9:48 AM 10/28/2022    4:35 PM  Readmission Risk Prevention Plan  Transportation Screening Complete Complete  PCP or Specialist Appt within 5-7 Days  Complete  PCP or Specialist Appt within 3-5 Days Complete   Home Care Screening  Complete  Medication Review (RN CM)  Referral to Pharmacy  HRI or Home Care Consult Complete   Social Work Consult for Recovery Care Planning/Counseling Complete

## 2023-09-25 NOTE — Progress Notes (Signed)
 Physical Therapy Treatment Patient Details Name: ASNA MULDROW MRN: [id number] DOB: [birth year] Today's Date: 09/25/2023   History of Present Illness 89 year old female comes into the hospital with anemia, abdominal discomfort as well as maroon stools, S/P partial colectomy 09/21/23 . WUJ:WJXB history of breast cancer, HTN, permanent A-fib on Eliquis    PT Comments  Pt continues very cooperative and with noted improvement in activity tolerance, decreasing level of assist for most tasks and stability with gait.      If plan is discharge home, recommend the following: A little help with walking and/or transfers;Assistance with cooking/housework;Assist for transportation;Help with stairs or ramp for entrance   Can travel by private vehicle     No  Equipment Recommendations  None recommended by PT    Recommendations for Other Services       Precautions / Restrictions Precautions Precautions: Fall Restrictions Weight Bearing Restrictions Per Provider Order: No     Mobility  Bed Mobility Overal bed mobility: Needs Assistance Bed Mobility: Rolling, Sidelying to Sit Rolling: Contact guard assist Sidelying to sit: Contact guard assist       General bed mobility comments: cues for technique, CGA for safety    Transfers Overall transfer level: Needs assistance Equipment used: Rolling walker (2 wheels) Transfers: Sit to/from Stand Sit to Stand: Min assist           General transfer comment: cues for use of UEs to self assist    Ambulation/Gait Ambulation/Gait assistance: Min assist Gait Distance (Feet): 90 Feet (twice) Assistive device: Rolling walker (2 wheels) Gait Pattern/deviations: Step-to pattern, Step-through pattern, Decreased step length - right, Decreased step length - left, Shuffle, Trunk flexed Gait velocity: decr     General Gait Details: Cues for posture and position from RW; distance ltd by fatigue   Stairs             Wheelchair Mobility      Tilt Bed    Modified Rankin (Stroke Patients Only)       Balance Overall balance assessment: Needs assistance Sitting-balance support: Single extremity supported, No upper extremity supported Sitting balance-Leahy Scale: Good     Standing balance support: Single extremity supported Standing balance-Leahy Scale: Poor                              Communication Communication Communication: No apparent difficulties  Cognition Arousal: Alert Behavior During Therapy: WFL for tasks assessed/performed   PT - Cognitive impairments: No family/caregiver present to determine baseline                       PT - Cognition Comments: follows directions, cues to stay on task Following commands: Intact      Cueing Cueing Techniques: Verbal cues, Gestural cues  Exercises      General Comments        Pertinent Vitals/Pain Pain Assessment Pain Assessment: 0-10 Pain Score: 2  Pain Location: abd. Pain Descriptors / Indicators: Aching, Sore Pain Intervention(s): Limited activity within patient's tolerance    Home Living                          Prior Function            PT Goals (current goals can now be found in the care plan section) Acute Rehab PT Goals Patient Stated Goal: go to rehab PT Goal Formulation: With patient Time For  Goal Achievement: 10/06/23 Potential to Achieve Goals: Good Progress towards PT goals: Progressing toward goals    Frequency    Min 1X/week      PT Plan      Co-evaluation              AM-PAC PT "6 Clicks" Mobility   Outcome Measure  Help needed turning from your back to your side while in a flat bed without using bedrails?: A Little Help needed moving from lying on your back to sitting on the side of a flat bed without using bedrails?: A Little Help needed moving to and from a bed to a chair (including a wheelchair)?: A Little Help needed standing up from a chair using your arms (e.g.,  wheelchair or bedside chair)?: A Little Help needed to walk in hospital room?: A Little Help needed climbing 3-5 steps with a railing? : A Lot 6 Click Score: 17    End of Session Equipment Utilized During Treatment: Gait belt Activity Tolerance: Patient tolerated treatment well Patient left: in chair;with chair alarm set;with call bell/phone within reach;with family/visitor present Nurse Communication: Mobility status PT Visit Diagnosis: Unsteadiness on feet (R26.81);Muscle weakness (generalized) (M62.81);Difficulty in walking, not elsewhere classified (R26.2);Pain     Time: 3664-4034 PT Time Calculation (min) (ACUTE ONLY): 20 min  Charges:    $Gait Training: 8-22 mins PT General Charges $$ ACUTE PT VISIT: 1 Visit                     [name] PT Acute Rehabilitation Services Pager [phone] Office [phone]    Kaylyne Axton 09/25/2023, 1:59 PM

## 2023-09-25 NOTE — Plan of Care (Signed)
 ?  Problem: Clinical Measurements: ?Goal: Will remain free from infection ?Outcome: Progressing ?  ?

## 2023-09-26 ENCOUNTER — Encounter: Payer: Self-pay | Admitting: Hematology and Oncology

## 2023-09-26 ENCOUNTER — Other Ambulatory Visit (HOSPITAL_COMMUNITY): Payer: Self-pay

## 2023-09-26 MED ORDER — APIXABAN 2.5 MG PO TABS
2.5000 mg | ORAL_TABLET | Freq: Two times a day (BID) | ORAL | 2 refills | Status: DC
  Filled 2023-09-26 (×2): qty 60, 30d supply, fill #0

## 2023-09-26 MED ORDER — GABAPENTIN 100 MG PO CAPS: 200.0000 mg | ORAL_CAPSULE | Freq: Every day | ORAL | 0 refills | Status: DC

## 2023-09-26 MED ORDER — PROCHLORPERAZINE MALEATE 10 MG PO TABS: 10.0000 mg | ORAL_TABLET | Freq: Four times a day (QID) | ORAL | 0 refills | Status: AC | PRN

## 2023-09-26 MED ORDER — ONDANSETRON HCL 4 MG PO TABS: 4.0000 mg | ORAL_TABLET | Freq: Four times a day (QID) | ORAL | 0 refills | Status: AC | PRN

## 2023-09-26 MED ORDER — TRAMADOL HCL 50 MG PO TABS: 50.0000 mg | ORAL_TABLET | Freq: Four times a day (QID) | ORAL | 0 refills | Status: DC | PRN

## 2023-09-26 MED ORDER — APIXABAN 2.5 MG PO TABS: 2.5000 mg | ORAL_TABLET | Freq: Two times a day (BID) | ORAL | 2 refills | Status: DC

## 2023-09-26 MED ORDER — MELATONIN 3 MG PO TABS: 3.0000 mg | ORAL_TABLET | Freq: Every evening | ORAL | Status: DC | PRN

## 2023-09-26 MED ORDER — ALUM & MAG HYDROXIDE-SIMETH 200-200-20 MG/5ML PO SUSP: 30.0000 mL | Freq: Four times a day (QID) | ORAL | 0 refills | Status: AC | PRN

## 2023-09-26 MED ORDER — CALCIUM POLYCARBOPHIL 625 MG PO TABS: 625.0000 mg | ORAL_TABLET | Freq: Two times a day (BID) | ORAL | Status: AC

## 2023-09-26 NOTE — Progress Notes (Signed)
 5 Days Post-Op  Subjective: CC: Feels well.  Tolerating diet, having bowel movements, ambulating, no significant pain  Objective: Vital signs in last 24 hours: Temp:  [97.4 F (36.3 C)-98.8 F (37.1 C)] 97.9 F (36.6 C) (02/22 0534) Pulse Rate:  [68-90] 90 (02/22 0534) Resp:  [16-18] 18 (02/22 0534) BP: (123-137)/(56-74) 127/65 (02/22 0534) SpO2:  [91 %-97 %] 93 % (02/22 0534) Last BM Date : 09/26/23  Intake/Output from previous day: 02/21 0701 - 02/22 0700 In: 780 [P.O.:780] Out: -  Intake/Output this shift: No intake/output data recorded.  PE: Gen:  Alert, NAD, pleasant Abd: Soft, ND, appropriately ttp around incisions - no rigidity or guarding and otherwise NT. Laparoscopic incisions and Pfannenstiel incision with glue intact appears well and are without drainage, bleeding, or signs of infection   Lab Results:  Recent Labs    09/25/23 1641  WBC 12.3*  HGB 11.1*  HCT 37.7  PLT 382   BMET Recent Labs    09/25/23 1641  NA 135  K 3.6  CL 97*  CO2 29  GLUCOSE 101*  BUN 19  CREATININE 0.86  CALCIUM 9.3   PT/INR No results for input(s): "LABPROT", "INR" in the last 72 hours. CMP     Component Value Date/Time   NA 135 09/25/2023 1641   NA 142 09/17/2021 1024   K 3.6 09/25/2023 1641   CL 97 (L) 09/25/2023 1641   CO2 29 09/25/2023 1641   GLUCOSE 101 (H) 09/25/2023 1641   BUN 19 09/25/2023 1641   BUN 20 09/17/2021 1024   CREATININE 0.86 09/25/2023 1641   CREATININE 1.11 (H) 09/14/2023 1052   CREATININE 1.32 (H) 08/09/2015 1122   CALCIUM 9.3 09/25/2023 1641   PROT 6.2 (L) 09/25/2023 1641   PROT 6.5 06/01/2019 0958   ALBUMIN 2.9 (L) 09/25/2023 1641   ALBUMIN 4.2 06/01/2019 0958   AST 13 (L) 09/25/2023 1641   AST 9 (L) 09/14/2023 1052   ALT 10 09/25/2023 1641   ALT 5 09/14/2023 1052   ALKPHOS 52 09/25/2023 1641   BILITOT 0.7 09/25/2023 1641   BILITOT 0.4 09/14/2023 1052   GFRNONAA >60 09/25/2023 1641   GFRNONAA 48 (L) 09/14/2023 1052   GFRAA  44 (L) 02/22/2020 1041   Lipase     Component Value Date/Time   LIPASE 14 09/18/2023 1039    Studies/Results: No results found.   Anti-infectives: Anti-infectives (From admission, onward)    Start     Dose/Rate Route Frequency Ordered Stop   09/21/23 2030  cefoTEtan (CEFOTAN) 2 g in sodium chloride 0.9 % 100 mL IVPB        2 g 200 mL/hr over 30 Minutes Intravenous Every 12 hours 09/21/23 1222 09/21/23 2228   09/21/23 0600  cefoTEtan (CEFOTAN) 2 g in sodium chloride 0.9 % 100 mL IVPB        2 g 200 mL/hr over 30 Minutes Intravenous On call to O.R. 09/20/23 1112 09/21/23 1247   09/20/23 1400  neomycin (MYCIFRADIN) tablet 1,000 mg       Placed in "And" Linked Group   1,000 mg Oral 3 times per day 09/20/23 1112 09/20/23 2146   09/20/23 1400  metroNIDAZOLE (FLAGYL) tablet 1,000 mg       Placed in "And" Linked Group   1,000 mg Oral 3 times per day 09/20/23 1112 09/20/23 2146        Assessment/Plan POD 5 s/p lap assisted R colectomy by Dr. Michaell Cowing on 09/21/23 - Tolerating soft diet  and having bowel function.  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 CEA 10.9. CT CAP without obvious mets. She has been set up for GI tumor board. She is being set up to see Dr. Truett Perna of Oncology.  - Back on anticoagulation, hemoglobin stable - Mobilize, PT/OT rec SNF - Pulm toilet.  -Okay for discharge to SNF today as planned  FEN - Soft, IVF per primary  VTE - SCDs, Eliquis for hx of A. Fib ID - Cefotetan peri-op, none currently.  Foley - None, spont void   LOS: 8 days    Berna Bue, MD [facility] Surgery 09/26/2023, 8:36 AM Please see Amion for pager number during day hours 7:00am-4:30pm

## 2023-09-26 NOTE — Discharge Summary (Addendum)
 Physician Discharge Summary  Mallory Stephens:[id number] DOB: Sep 09, 1932 DOA: 09/18/2023  PCP: Mila Palmer, MD  Admit date: 09/18/2023 Discharge date: 09/26/2023  Admitted From: home Disposition:  snf  Recommendations for Outpatient Follow-up:  Follow up with PCP in 1-2 weeks Please obtain BMP/CBC in one week Please follow up with Dr Michaell Cowing  Home Health:none Equipment/Devices:none Discharge Condition:stable CODE STATUS: dnr Diet recommendation: cardiac   Brief/Interim Summary:89 year old female with history of breast cancer, HTN, permanent A-fib on Eliquis comes into the hospital with anemia, abdominal discomfort as well as maroon stools. She has a history of iron deficiency anemia for which she has been worked up by hematology without a clear cause. Over the last couple of days prior to admission has noticed that her stools were maroon and has been having abdominal discomfort. She discussed with her hematologist, and eventually presented to the ER with concerns for GI bleed. CT scan on admission raised concern about an irregular mass in the cecum/ascending colon, concerning for colonic malignancy. 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  GI and general surgery consulted, she was taken to the OR 2/17 by Dr. Michaell Cowing, and is status post laparoscopic right colectomy with anastomosis CT chest abdomen pelvis no evidence of  metastasis She tolerated a regular diet and had BM s prior to dc She will follow-up with oncology Dr. Truett Perna and Dr Michaell Cowing on discharge   Active problems Chronic diastolic CHF-continue Lasix stable on room air    Essential hypertension continue metoprolol and Lasix.  Patient had few low blood pressure readings for which she was started on midodrine which has now been stopped as she is more on the hypertensive side.   Restarted cozaar   Permanent atrial fibrillation-continue metoprolol, restarted Eliquis 09/23/2023 but at a lower dose due to age and kidney functions   history of breast cancer-follows with Dr. Georgiann Mohs, continue anastrozole   Mood d/o -continue paroxetine   Hyperlipidemia-continue statin   Estimated body mass index is 21.13 kg/m as calculated from the following:   Height as of this encounter: 5\' 5"  (1.651 m).   Weight as of this encounter: 57.6 kg.  Discharge Instructions  Discharge Instructions     Diet - low sodium heart healthy   Complete by: As directed    Diet - low sodium heart healthy   Complete by: As directed    Increase activity slowly   Complete by: As directed    Increase activity slowly   Complete by: As directed       Allergies as of 09/26/2023       Reactions   Sulfa Antibiotics Rash   Sulfa Drugs Cross Reactors Rash        Medication List     STOP taking these medications    losartan 25 MG tablet Commonly known as: Cozaar       TAKE these medications    acetaminophen 325 MG tablet Commonly known as:  TYLENOL Take 325 mg by mouth every 6 (six) hours as needed for moderate pain.   alum & mag hydroxide-simeth 200-200-20 MG/5ML suspension Commonly known as: MAALOX/MYLANTA Take 30 mLs by mouth every 6 (six) hours as needed for indigestion (gas/bloating).   anastrozole 1 MG tablet Commonly known as: ARIMIDEX TAKE 1 TABLET DAILY What changed: when to take this   apixaban 2.5 MG Tabs tablet Commonly known as: ELIQUIS Take  1 tablet (2.5 mg total) by mouth 2 (two) times daily. What changed:  medication strength how much to take   cholecalciferol 25 MCG (1000 UNIT) tablet Commonly known as: VITAMIN D3 Take 1 tablet (1,000 Units total) by mouth daily.   famotidine 40 MG tablet Commonly known as: PEPCID Take 40 mg by mouth at bedtime.   furosemide 20 MG tablet Commonly known as: LASIX Take 20 mg by mouth in the morning. What changed: Another medication with the same name was removed. Continue taking this medication, and follow the directions you see here.   gabapentin 100 MG capsule Commonly known as: NEURONTIN Take 2 capsules (200 mg total) by mouth at bedtime.   ibandronate 150 MG tablet Commonly known as: BONIVA TAKE 1 TABLET BY MOUTH EVERY 30 DAYS ON THE 10TH DAY OF EACH MONTH IN THE MORNING WITH A FULL GLASS OF WATER,ON AN EMPTY STOMACH AND DO NOT TAKE ANYTHING ELSE BY MOUTH OR LIE DOWN FOR THE NEXT 30 MIN What changed: See the new instructions.   melatonin 3 MG Tabs tablet Take 1 tablet (3 mg total) by mouth at bedtime as needed.   metoprolol tartrate 25 MG tablet Commonly known as: LOPRESSOR TAKE 1 TABLET TWICE A DAY   ondansetron 4 MG tablet Commonly known as: ZOFRAN Take 1 tablet (4 mg total) by mouth every 6 (six) hours as needed for nausea.   PARoxetine 10 MG tablet Commonly known as: PAXIL Take 10 mg by mouth every morning.   polycarbophil 625 MG tablet Commonly known as: FIBERCON Take 1 tablet (625 mg total) by mouth 2 (two) times daily.   polyvinyl alcohol 1.4 % ophthalmic solution Commonly known as: LIQUIFILM TEARS Place 1-2 drops into both eyes every 6 (six) hours as needed for dry eyes.   pravastatin 20 MG tablet Commonly known as: PRAVACHOL Take 20 mg by mouth at bedtime.   prochlorperazine 10 MG tablet Commonly known as: COMPAZINE Take 1 tablet (10 mg total) by mouth every 6 (six) hours as needed for refractory nausea / vomiting (Use for nausea and / or vomiting  unresolved with ondansetron (Zofran)).   solifenacin 5 MG tablet Commonly known as: VESICARE Take 5 mg by mouth in the morning.   traMADol 50 MG tablet Commonly known as: ULTRAM Take 1 tablet (50 mg total) by mouth every 6 (six) hours as needed for moderate pain (pain score 4-6).        Contact information for follow-up providers     Karie Soda, MD Follow up on 10/12/2023.   Specialties: General Surgery, Colon and Rectal Surgery Why: 10/12/23 arrive at 2:00. Please bring a copy of your photo ID, insurance card, and arrive 30 minutes prior to your appointment for paperwork. Contact information: [address] [place] [address] [phone]         Mila Palmer, MD Follow up.   Specialty: Family Medicine Contact information: [address] [place] [address] [phone]              Contact  information for after-discharge care     Destination     [facility] .   Service: Skilled Nursing Contact information: [address] Test Update Address [place] [address] [phone]                    Allergies  Allergen Reactions   Sulfa Antibiotics Rash   Sulfa Drugs Cross Reactors Rash    Consultations: surgery   Procedures/Studies: CT CHEST ABDOMEN PELVIS W CONTRAST Result Date: 09/23/2023 CLINICAL DATA:  Colon cancer staging, status post partial right hemicolectomy * Tracking Code: BO * EXAM: CT CHEST, ABDOMEN, AND PELVIS WITH CONTRAST TECHNIQUE: Multidetector CT imaging of the chest, abdomen and pelvis was performed following the standard protocol during bolus administration of intravenous contrast. RADIATION DOSE REDUCTION: This exam was performed according to the departmental dose-optimization program which includes automated exposure control, adjustment of the mA and/or kV according to patient size and/or use of iterative reconstruction technique. CONTRAST:  30mL  OMNIPAQUE IOHEXOL 300 MG/ML SOLN, OMNIPAQUE IOHEXOL 300 MG/ML SOLN additional oral enteric contrast COMPARISON:  CT angiogram abdomen pelvis, 09/18/2023 FINDINGS: CT CHEST FINDINGS Cardiovascular: Aortic atherosclerosis. Cardiomegaly. No pericardial effusion. Mediastinum/Nodes: Benign, calcified granulomatous right hilar and subcarinal lymph nodes. No suspicious mediastinal, hilar, or axillary lymph nodes. Thyroid gland, trachea, and esophagus demonstrate no significant findings. Lungs/Pleura: Small bilateral pleural effusions. Unchanged mild pulmonary fibrosis with bibasilar predominant distribution, assessment limited in the presence of effusions. Subpleural radiation fibrosis of the anterior left lung. Benign, calcified right lower lobe granulomatous lymph node. Musculoskeletal: Status post left lumpectomy. No acute osseous findings. CT ABDOMEN PELVIS FINDINGS Hepatobiliary: No focal liver abnormality is seen. Status post cholecystectomy. Unchanged postoperative biliary ductal dilatation. Pancreas: Unremarkable. No pancreatic ductal dilatation or surrounding inflammatory changes. Spleen: Normal in size without significant abnormality. Adrenals/Urinary Tract: Adrenal glands are unremarkable. Kidneys are normal, without renal calculi, solid lesion, or hydronephrosis. Bladder is unremarkable. Stomach/Bowel: Stomach is within normal limits. Status post interval partial right hemicolectomy and reanastomosis. Pancolonic diverticulosis. Enteric contrast present to the transverse colon, with fluid present to the rectum. Vascular/Lymphatic: Aortic atherosclerosis. No enlarged abdominal or pelvic lymph nodes. Reproductive: No mass or other abnormality. Other: Subcutaneous emphysema related to laparoscopic access. Anasarca. Trace ascites and postoperative pneumoperitoneum. Musculoskeletal: No acute osseous findings. Unchanged lower thoracic and lumbar wedge deformities of T11 and L1. IMPRESSION: 1. Status post  interval partial right hemicolectomy and reanastomosis. Colon is filled with enteric contrast and fluid to the rectum. Assessment for intraluminal hemorrhage is very limited in the setting of enteric contrast; consider dedicated GI bleed protocol CT following adequate time for contrast passage if there is persistent concern for bleeding. 2. No evidence of lymphadenopathy or metastatic disease in the chest, abdomen, or pelvis. 3. Trace simple fluid attenuation ascites and postoperative pneumoperitoneum. 4. Small pleural effusions and anasarca. 5. Cardiomegaly. Aortic Atherosclerosis (ICD10-I70.0). Electronically Signed   By: Jearld Lesch M.D.   On: 09/23/2023 17:59   X-ray chest PA or AP Result Date: 09/21/2023 CLINICAL DATA:  [id number] Central venous catheter in place, temporary [id number] EXAM: CHEST  1 VIEW COMPARISON:  Radiographs 09/20/2023 and 08/14/2023. Abdominal CTA 09/18/2023. FINDINGS: 1121 hours. Right IJ central venous catheter projects to the lower SVC level. Stable cardiomegaly and aortic atherosclerosis. There are increased interstitial opacities throughout both lungs, likely reflecting progressive pulmonary edema. Trace bilateral pleural effusions without evidence of pneumothorax. The bones appear unchanged. IMPRESSION: 1. Right IJ central venous catheter projects to the lower SVC level.  No evidence of pneumothorax. 2. Increased interstitial opacities throughout both lungs, likely reflecting progressive pulmonary edema. Electronically Signed   By: Carey Bullocks M.D.   On: 09/21/2023 14:24   DG Chest 2 View Result Date: 09/20/2023 CLINICAL DATA:  [id number] Acute CHF (congestive heart failure) (HCC) [id number] EXAM: CHEST - 2 VIEW COMPARISON:  08/14/2023 FINDINGS: Cardiomegaly. Aortic atherosclerosis. Coarsened interstitial markings bilaterally. Calcified granulomas are present. Trace bilateral pleural effusions. No pneumothorax. Chronic compression fractures at T11 and L1. IMPRESSION: 1. Cardiomegaly  with trace bilateral pleural effusions. 2. Coarsened interstitial markings bilaterally which may reflect bronchitic lung changes or mild edema. Electronically Signed   By: Duanne Guess D.O.   On: 09/20/2023 14:07   CT ANGIO GI BLEED Result Date: 09/18/2023 CLINICAL DATA:  Lower GI bleed (Ped 0-17y) lower GI bleeding, mild abd discomfort. Lower abdominal pain. Bloody stools. Low hemoglobin. EXAM: CTA ABDOMEN AND PELVIS WITHOUT AND WITH CONTRAST TECHNIQUE: Multidetector CT imaging of the abdomen and pelvis was performed using the standard protocol during bolus administration of intravenous contrast. Multiplanar reconstructed images and MIPs were obtained and reviewed to evaluate the vascular anatomy. RADIATION DOSE REDUCTION: This exam was performed according to the departmental dose-optimization program which includes automated exposure control, adjustment of the mA and/or kV according to patient size and/or use of iterative reconstruction technique. CONTRAST:  80mL OMNIPAQUE IOHEXOL 350 MG/ML SOLN COMPARISON:  CT scan abdomen and pelvis from 04/06/2022. FINDINGS: VASCULAR Aorta: Normal caliber aorta without aneurysm, dissection, vasculitis or significant stenosis. Celiac: Patent without evidence of aneurysm, dissection, vasculitis or significant stenosis. There is mild narrowing at the origin to calcified plaques. SMA: Patent without evidence of aneurysm, dissection, vasculitis or significant stenosis. There is mild-to-moderate narrowing at the origin due to calcified plaques. Renals: Both renal arteries are patent without evidence of aneurysm, dissection, vasculitis, fibromuscular dysplasia or significant stenosis. IMA: Patent without evidence of aneurysm, dissection, vasculitis or significant stenosis. Inflow: Patent without evidence of aneurysm, dissection, vasculitis or significant stenosis. Proximal Outflow: Bilateral common femoral and visualized portions of the superficial and profunda femoral arteries  are patent without evidence of aneurysm, dissection, vasculitis or significant stenosis. Veins: No obvious venous abnormality within the limitations of this arterial phase study. Review of the MIP images confirms the above findings. NON-VASCULAR Lower chest: Redemonstration of peripheral subpleural reticulations in the visualized bilateral lung bases. However, there is superimposed smooth interlobular septal thickening and bilateral trace pleural effusions, concerning for congestive heart failure/pulmonary edema. There is a 1 cm sized calcified granuloma in the right lung lower lobe. The lung bases are otherwise clear. No pleural effusion. The heart is normal in size. No pericardial effusion. Hepatobiliary: The liver is normal in size. Non-cirrhotic configuration. No suspicious mass. No intrahepatic bile duct dilation. There is mild prominence of the extrahepatic bile duct, most likely due to post cholecystectomy status. Gallbladder is surgically absent. Pancreas: Unremarkable. No pancreatic ductal dilatation or surrounding inflammatory changes. Spleen: Within normal limits. No focal lesion. Adrenals/Urinary Tract: Adrenal glands are unremarkable. No suspicious renal mass. No hydronephrosis. No renal or ureteric calculi. Urinary bladder is under distended, precluding optimal assessment. However, no large mass or stones identified. No perivesical fat stranding. Stomach/Bowel: There is an approximately 8 cm long irregular heterogeneous markedly thick mass centered in the medial wall of the cecum/proximal ascending colon with extension outside the serosa, compatible with colonic malignancy. The terminal ileum is also likely involved in the tumor. The appendix is unremarkable. There are few adjacent heterogeneous subcentimeter sized lymph node, favored  to represent locoregional metastases. There are hyperattenuating areas in the lumen, which may represent blood products. However, no active extravasation of contrast  noted. No disproportionate dilation of the small or large bowel loops. There are multiple diverticula throughout the colon without diverticulitis. There is also a diverticulum arising from 3/4 part of duodenum. Vascular/Lymphatic: Small amount of ascites noted in the dependent pelvis. No pneumoperitoneum. No abdominal or pelvic lymphadenopathy, by size criteria. No aneurysmal dilation of the major abdominal arteries. There are moderate peripheral atherosclerotic vascular calcifications of the aorta and its major branches. Reproductive: The uterus is unremarkable. No large adnexal mass. Other: There is a tiny fat containing umbilical hernia. The soft tissues and abdominal wall are otherwise unremarkable. Musculoskeletal: No suspicious osseous lesions. There are mild - moderate multilevel degenerative changes in the visualized spine. There is mild-to-moderate compression deformity of L1 vertebra and moderate compression deformity of T11 vertebrae, similar to prior study. IMPRESSION: 1. No active extravasation of contrast noted to suggest active bleeding. 2. There is an irregular mass centered in the cecum/ascending colon, as described above, compatible with colonic malignancy. There are hyperattenuating areas in the lumen, which may represent blood products. 3. No distant metastatic disease identified within the abdomen or pelvis. 4. Findings favor congestive heart failure/pulmonary edema, as described above. 5. Multiple other nonacute observations, as described above. Aortic Atherosclerosis (ICD10-I70.0). Electronically Signed   By: Jules Schick M.D.   On: 09/18/2023 14:12   (Echo, Carotid, EGD, Colonoscopy, ERCP)    Subjective:  Anxious to go to rehab Discharge Exam: Vitals:   09/25/23 2024 09/26/23 0534  BP: (!) 123/56 127/65  Pulse: 90 90  Resp: 18 18  Temp: 98.8 F (37.1 C) 97.9 F (36.6 C)  SpO2: 91% 93%   Vitals:   09/25/23 0609 09/25/23 1341 09/25/23 2024 09/26/23 0534  BP: (!) 163/70  137/74 (!) 123/56 127/65  Pulse: 94 68 90 90  Resp: 18 16 18 18   Temp: 97.8 F (36.6 C) (!) 97.4 F (36.3 C) 98.8 F (37.1 C) 97.9 F (36.6 C)  TempSrc: Oral Oral  Oral  SpO2: 94% 97% 91% 93%  Weight:      Height:        General: Pt is alert, awake, not in acute distress Cardiovascular: RRR, S1/S2 +, no rubs, no gallops Respiratory: CTA bilaterally, no wheezing, no rhonchi Abdominal: Soft, NT, ND, bowel sounds + Extremities: no edema, no cyanosis    The results of significant diagnostics from this hospitalization (including imaging, microbiology, ancillary and laboratory) are listed below for reference.     Microbiology: Recent Results (from the past 240 hours)  Surgical pcr screen     Status: None   Collection Time: 09/20/23  5:59 PM   Specimen: Nasal Mucosa; Nasal Swab  Result Value Ref Range Status   MRSA, PCR NEGATIVE NEGATIVE Final   Staphylococcus aureus NEGATIVE NEGATIVE Final    Comment: (NOTE) The Xpert SA Assay (FDA approved for NASAL specimens in patients 42 years of age and older), is one component of a comprehensive surveillance program. It is not intended to diagnose infection nor to guide or monitor treatment. Performed at [facility], [address], [place], [place] [address]      Labs: BNP (last 3 results) Recent Labs    10/25/22 0804 08/13/23 1146  BNP 613.0* 897.0*   Basic Metabolic Panel: Recent Labs  Lab 09/20/23 0559 09/21/23 0632 09/22/23 0935 09/23/23 0509 09/25/23 1641  NA 138 137 136 137 135  K 3.4* 3.3* 3.3* 3.8 3.6  CL 104 103 100 98 97*  CO2 25 23 26 27 29   GLUCOSE 91 119* 110* 114* 101*  BUN 14 12 12 15 19   CREATININE 0.81 0.94 0.95 0.94 0.86  CALCIUM 8.7* 8.9 8.5* 9.0 9.3  MG 2.0 1.9 1.8  --   --    Liver Function Tests: Recent Labs  Lab 09/20/23 0559 09/22/23 0935 09/23/23 0509 09/25/23 1641  AST 11* 13* 12* 13*  ALT 7 9 10 10   ALKPHOS 45 37* 39 52  BILITOT 1.6* 1.0 1.0 0.7  PROT 5.4*  5.4* 5.6* 6.2*  ALBUMIN 2.7* 2.7* 2.8* 2.9*   No results for input(s): "LIPASE", "AMYLASE" in the last 168 hours. No results for input(s): "AMMONIA" in the last 168 hours. CBC: Recent Labs  Lab 09/20/23 0559 09/21/23 9562 09/22/23 0935 09/23/23 0509 09/25/23 1641  WBC 8.8 9.7 11.5* 14.7* 12.3*  HGB 8.0* 9.4* 10.0* 10.9* 11.1*  HCT 27.8* 33.4* 33.8* 37.4 37.7  MCV 93.6 95.7 92.1 93.0 91.1  PLT 289 304 279 321 382   Cardiac Enzymes: No results for input(s): "CKTOTAL", "CKMB", "CKMBINDEX", "TROPONINI" in the last 168 hours. BNP: Invalid input(s): "POCBNP" CBG: No results for input(s): "GLUCAP" in the last 168 hours. D-Dimer No results for input(s): "DDIMER" in the last 72 hours. Hgb A1c No results for input(s): "HGBA1C" in the last 72 hours. Lipid Profile No results for input(s): "CHOL", "HDL", "LDLCALC", "TRIG", "CHOLHDL", "LDLDIRECT" in the last 72 hours. Thyroid function studies No results for input(s): "TSH", "T4TOTAL", "T3FREE", "THYROIDAB" in the last 72 hours.  Invalid input(s): "FREET3" Anemia work up No results for input(s): "VITAMINB12", "FOLATE", "FERRITIN", "TIBC", "IRON", "RETICCTPCT" in the last 72 hours. Urinalysis    Component Value Date/Time   COLORURINE YELLOW 09/18/2023 1538   APPEARANCEUR CLEAR 09/18/2023 1538   LABSPEC >1.046 (H) 09/18/2023 1538   PHURINE 6.0 09/18/2023 1538   GLUCOSEU NEGATIVE 09/18/2023 1538   HGBUR NEGATIVE 09/18/2023 1538   BILIRUBINUR NEGATIVE 09/18/2023 1538   KETONESUR NEGATIVE 09/18/2023 1538   PROTEINUR 30 (A) 09/18/2023 1538   UROBILINOGEN 0.2 06/23/2010 1322   NITRITE NEGATIVE 09/18/2023 1538   LEUKOCYTESUR NEGATIVE 09/18/2023 1538   Sepsis Labs Recent Labs  Lab 09/21/23 0632 09/22/23 0935 09/23/23 0509 09/25/23 1641  WBC 9.7 11.5* 14.7* 12.3*   Microbiology Recent Results (from the past 240 hours)  Surgical pcr screen     Status: None   Collection Time: 09/20/23  5:59 PM   Specimen: Nasal Mucosa; Nasal  Swab  Result Value Ref Range Status   MRSA, PCR NEGATIVE NEGATIVE Final   Staphylococcus aureus NEGATIVE NEGATIVE Final    Comment: (NOTE) The Xpert SA Assay (FDA approved for NASAL specimens in patients 61 years of age and older), is one component of a comprehensive surveillance program. It is not intended to diagnose infection nor to guide or monitor treatment. Performed at [facility], [address], [place], [place] [address]    MORE THAN 30 MIN  SIGNED: Alwyn Ren, MD  [facility] 09/26/2023, 8:22 AM

## 2023-09-26 NOTE — Plan of Care (Signed)
   Problem: Activity: Goal: Risk for activity intolerance will decrease Outcome: Progressing   Problem: Nutrition: Goal: Adequate nutrition will be maintained Outcome: Completed/Met

## 2023-09-26 NOTE — Progress Notes (Signed)
 TOC med in a secure delivered to nurses station- placed w/ d/c info for PTAR. Primary nurse updted on delivery

## 2023-09-26 NOTE — TOC Transition Note (Addendum)
 Transition of Care [facility]) - Discharge Note   Patient Details  Name: Mallory Stephens MRN: [id number] Date of Birth: 1933-01-31  Transition of Care [facility]) CM/SW Contact:  Adrian Prows, RN Phone Number: 09/26/2023, 10:08 AM   Clinical Narrative:    D/C orders received; contacted by [place] at [facility]; she gave RM # [phone], call report # [phone]; she says pt should arrive at 2 PM or later; she also says pt needs to bring 30-day supply of Eliquis to facility; Dr Jerolyn Center and Marveen Reeks, RPh notified via secure chat; pt transport by PTAR; notified pt and niece Mallory Stephens at bedside; they agree to d/c plan; spoke w Weston Brass in [facility]; he says Eliquis would come from OP pharmacy and he will notify them; Weston Brass also says meds will be delivered to room; PTAR called at [phone]; spoke w/ operator [phone]; arranged 1330 pick up; D/C summary and SNF transfer report sent via hubno TOC needs.   Final next level of care: Skilled Nursing Facility Barriers to Discharge: No Barriers Identified   Patient Goals and CMS Choice            Discharge Placement              Patient chooses bed at: [facility] Patient to be transferred to facility by: PTAR Name of family member notified: Mallory Stephens (niece) Patient and family notified of of transfer: 09/26/23  Discharge Plan and Services Additional resources added to the After Visit Summary for                                       Social Drivers of Health (SDOH) Interventions SDOH Screenings   Food Insecurity: No Food Insecurity (09/18/2023)  Housing: Low Risk  (09/18/2023)  Transportation Needs: No Transportation Needs (09/18/2023)  Utilities: Not At Risk (09/18/2023)  Social Connections: Socially Isolated (09/18/2023)  Tobacco Use: Medium Risk (09/18/2023)     Readmission Risk Interventions    09/20/2023    9:48 AM 10/28/2022    4:35 PM  Readmission Risk Prevention Plan  Transportation Screening Complete Complete  PCP  or Specialist Appt within 5-7 Days  Complete  PCP or Specialist Appt within 3-5 Days Complete   Home Care Screening  Complete  Medication Review (RN CM)  Referral to Pharmacy  HRI or Home Care Consult Complete   Social Work Consult for Recovery Care Planning/Counseling Complete

## 2023-09-30 ENCOUNTER — Telehealth: Payer: Self-pay | Admitting: Cardiology

## 2023-09-30 ENCOUNTER — Encounter: Payer: Self-pay | Admitting: Cardiology

## 2023-09-30 ENCOUNTER — Other Ambulatory Visit: Payer: Self-pay | Admitting: *Deleted

## 2023-09-30 DIAGNOSIS — I350 Nonrheumatic aortic (valve) stenosis: Secondary | ICD-10-CM | POA: Insufficient documentation

## 2023-09-30 DIAGNOSIS — I272 Pulmonary hypertension, unspecified: Secondary | ICD-10-CM | POA: Insufficient documentation

## 2023-09-30 DIAGNOSIS — I050 Rheumatic mitral stenosis: Secondary | ICD-10-CM | POA: Insufficient documentation

## 2023-09-30 DIAGNOSIS — I340 Nonrheumatic mitral (valve) insufficiency: Secondary | ICD-10-CM | POA: Insufficient documentation

## 2023-09-30 DIAGNOSIS — I351 Nonrheumatic aortic (valve) insufficiency: Secondary | ICD-10-CM

## 2023-09-30 NOTE — Progress Notes (Signed)
 The proposed treatment discussed in conference is for discussion purpose only and is not a binding recommendation.  The patients have not been physically examined, or presented with their treatment options.  Therefore, final treatment plans cannot be decided.

## 2023-09-30 NOTE — Telephone Encounter (Signed)
 Sister Rhunette Croft) stated patient recently had surgery and wants to reschedule echocardiogram test the week of 3/10.  Requesting orders be extended.

## 2023-10-01 NOTE — Telephone Encounter (Signed)
 Informed sister of Dr. Norris Cross response/recommendation. Order for echo in 1 year placed in epic.

## 2023-10-05 ENCOUNTER — Encounter: Payer: Self-pay | Admitting: Hematology and Oncology

## 2023-10-06 ENCOUNTER — Ambulatory Visit (HOSPITAL_COMMUNITY): Payer: Medicare HMO

## 2023-10-08 ENCOUNTER — Inpatient Hospital Stay

## 2023-10-08 ENCOUNTER — Inpatient Hospital Stay: Payer: Medicare HMO | Attending: Adult Health | Admitting: Oncology

## 2023-10-08 ENCOUNTER — Encounter: Payer: Self-pay | Admitting: *Deleted

## 2023-10-08 VITALS — BP 129/58 | HR 76 | Temp 98.2°F | Resp 18 | Ht 65.0 in | Wt 126.0 lb

## 2023-10-08 DIAGNOSIS — Z1721 Progesterone receptor positive status: Secondary | ICD-10-CM | POA: Diagnosis not present

## 2023-10-08 DIAGNOSIS — E049 Nontoxic goiter, unspecified: Secondary | ICD-10-CM | POA: Insufficient documentation

## 2023-10-08 DIAGNOSIS — Z87440 Personal history of urinary (tract) infections: Secondary | ICD-10-CM | POA: Diagnosis not present

## 2023-10-08 DIAGNOSIS — K625 Hemorrhage of anus and rectum: Secondary | ICD-10-CM | POA: Diagnosis not present

## 2023-10-08 DIAGNOSIS — I10 Essential (primary) hypertension: Secondary | ICD-10-CM | POA: Diagnosis not present

## 2023-10-08 DIAGNOSIS — Z882 Allergy status to sulfonamides status: Secondary | ICD-10-CM | POA: Insufficient documentation

## 2023-10-08 DIAGNOSIS — Z8042 Family history of malignant neoplasm of prostate: Secondary | ICD-10-CM | POA: Insufficient documentation

## 2023-10-08 DIAGNOSIS — Z8711 Personal history of peptic ulcer disease: Secondary | ICD-10-CM | POA: Diagnosis not present

## 2023-10-08 DIAGNOSIS — Z85828 Personal history of other malignant neoplasm of skin: Secondary | ICD-10-CM | POA: Diagnosis not present

## 2023-10-08 DIAGNOSIS — D508 Other iron deficiency anemias: Secondary | ICD-10-CM | POA: Diagnosis not present

## 2023-10-08 DIAGNOSIS — Z79899 Other long term (current) drug therapy: Secondary | ICD-10-CM | POA: Diagnosis not present

## 2023-10-08 DIAGNOSIS — Z803 Family history of malignant neoplasm of breast: Secondary | ICD-10-CM | POA: Insufficient documentation

## 2023-10-08 DIAGNOSIS — C180 Malignant neoplasm of cecum: Secondary | ICD-10-CM | POA: Insufficient documentation

## 2023-10-08 DIAGNOSIS — Z87891 Personal history of nicotine dependence: Secondary | ICD-10-CM | POA: Diagnosis not present

## 2023-10-08 DIAGNOSIS — I4821 Permanent atrial fibrillation: Secondary | ICD-10-CM | POA: Diagnosis not present

## 2023-10-08 DIAGNOSIS — C50212 Malignant neoplasm of upper-inner quadrant of left female breast: Secondary | ICD-10-CM | POA: Diagnosis not present

## 2023-10-08 DIAGNOSIS — Z9049 Acquired absence of other specified parts of digestive tract: Secondary | ICD-10-CM | POA: Diagnosis not present

## 2023-10-08 DIAGNOSIS — Z1732 Human epidermal growth factor receptor 2 negative status: Secondary | ICD-10-CM | POA: Insufficient documentation

## 2023-10-08 DIAGNOSIS — Z170 Estrogen receptor positive status [ER+]: Secondary | ICD-10-CM | POA: Insufficient documentation

## 2023-10-08 DIAGNOSIS — I4891 Unspecified atrial fibrillation: Secondary | ICD-10-CM

## 2023-10-08 LAB — CBC WITH DIFFERENTIAL (CANCER CENTER ONLY)
Abs Immature Granulocytes: 0.02 10*3/uL (ref 0.00–0.07)
Basophils Absolute: 0.1 10*3/uL (ref 0.0–0.1)
Basophils Relative: 1 %
Eosinophils Absolute: 0.4 10*3/uL (ref 0.0–0.5)
Eosinophils Relative: 4 %
HCT: 35.1 % — ABNORMAL LOW (ref 36.0–46.0)
Hemoglobin: 10.7 g/dL — ABNORMAL LOW (ref 12.0–15.0)
Immature Granulocytes: 0 %
Lymphocytes Relative: 33 %
Lymphs Abs: 2.9 10*3/uL (ref 0.7–4.0)
MCH: 26.6 pg (ref 26.0–34.0)
MCHC: 30.5 g/dL (ref 30.0–36.0)
MCV: 87.3 fL (ref 80.0–100.0)
Monocytes Absolute: 0.7 10*3/uL (ref 0.1–1.0)
Monocytes Relative: 8 %
Neutro Abs: 4.7 10*3/uL (ref 1.7–7.7)
Neutrophils Relative %: 54 %
Platelet Count: 477 10*3/uL — ABNORMAL HIGH (ref 150–400)
RBC: 4.02 MIL/uL (ref 3.87–5.11)
RDW: 16.5 % — ABNORMAL HIGH (ref 11.5–15.5)
WBC Count: 8.8 10*3/uL (ref 4.0–10.5)
nRBC: 0 % (ref 0.0–0.2)

## 2023-10-08 LAB — CEA (ACCESS): CEA (CHCC): 2.8 ng/mL (ref 0.00–5.00)

## 2023-10-08 NOTE — Progress Notes (Signed)
 PATIENT NAVIGATOR PROGRESS NOTE  Name: Mallory Stephens Date: 10/08/2023 MRN: [id number]  DOB: 04-22-1933   Reason for visit:  New patient appt  Comments:  Met with Mallory Stephens and her daughter during visit with Dr Truett Perna  Mallory Stephens has stage 2 colon cancer with no high risk features such as perforation or lymph node involvement and Dr Truett Perna does not recommend any treatment for her He discussed MMR protein abnormal and MSI high status   [facility] methylation reflex testing requested on surgical path Labs drawn today and contact number given and encouraged to call with any issues or quesitons    Time spent counseling/coordinating care: > 60 minutes

## 2023-10-08 NOTE — Progress Notes (Signed)
 [facility] New Patient Consult   Requesting MD: Alwyn Ren, Md [address] [place],  [place] [address]   Mallory Stephens 89 y.o.  Dec 16, 1932    Reason for Consult: Colon cancer   HPI: Mallory Stephens is here with her niece.  She reports a history of anemia for approximately the past year.  She presented to the emergency room 09/18/2023 for rectal bleeding.  The hemoglobin returned at 7.3.  The stool was Hemoccult positive.  A CT angiogram bleeding study 09/18/2023 revealed no active extravasation of contrast.  An irregular mass was noted at the cecum/ascending colon.  No distant metastatic disease in the abdomen or pelvis. She was admitted and transfused with packed red blood cells.  She was taken to the operating room by Dr. Michaell Cowing on 09/21/2023 for a laparoscopic proximal right colectomy.  A bulky mass was noted at the cecum/ascending colon.  No evidence of metastatic disease.  A right colectomy was performed with an ileum to transverse colon anastomosis. She was discharged to home on 09/26/2023.  The pathology confirmed adenocarcinoma of the cecum.  She is referred to discuss adjuvant treatment options.   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 Arbutus Ped   Malignant neoplasm of upper-inner quadrant of left breast in female, 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/2020    Mitral regurgitation    mild by echo 08/2023   Mitral stenosis    mean MVG by echo 08/2023   Multinodular goiter    Osteoarthritis    Permanent atrial fibrillation (HCC)    atrial fibrillation s/p DCCV 3/10 and 6/10   PONV (postoperative nausea and vomiting)    PUD (peptic ulcer disease)    Pulmonary HTN (HCC)    mild by echo 09/2023 PASP   Pulmonary nodules    followed  by Dr. Paulino Rily   UTI (urinary tract infection) 04/2017    .  G2, P0, 2 miscarriages  Past Surgical History:  Procedure Laterality Date   aspiration of cyst  1978   BREAST LUMPECTOMY Left 09/2020   BREAST LUMPECTOMY WITH RADIOACTIVE SEED LOCALIZATION Left 09/06/2020   Procedure: LEFT BREAST LUMPECTOMY WITH RADIOACTIVE SEED LOCALIZATION;  Surgeon: Almond Lint, MD;  Location: [facility] OR;  Service: General;  Laterality: Left;   BREAST SURGERY  1962   right breast for benign lesion   BREAST SURGERY  1971   bilateral lumpectomy for beingn lesions   CARDIOVERSION  07/22/2011   Procedure: CARDIOVERSION;  Surgeon: Quintella Reichert, MD;  Location: [facility] OR;  Service: Cardiovascular;  Laterality: N/A;   CARDIOVERSION  09/02/2011   Procedure: CARDIOVERSION;  Surgeon: Quintella Reichert, MD;  Location: [facility] OR;  Service: Cardiovascular;  Laterality: N/A;   cataract     cataract removal-bilateral   CHOLECYSTECTOMY  2003  CYSTOSCOPY     DILATION AND CURETTAGE OF UTERUS  1979   JOINT REPLACEMENT  2002   Right knee   JOINT REPLACEMENT  2007   left knee   LAPAROSCOPIC PARTIAL COLECTOMY N/A 09/21/2023   Procedure: LAPAROSCOPIC PARTIAL COLECTOMY;  Surgeon: Karie Soda, MD;  Location: [facility];  Service: General;  Laterality: N/A;    Medications: Reviewed  Allergies:  Allergies  Allergen Reactions   Sulfa Antibiotics Rash   Sulfa Drugs Cross Reactors Rash    Family history: 2 brothers had prostate cancer.  A sister had breast cancer.  2 nieces had breast cancer.  Social History:   Lives with her sister in  [place].  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  She quit smoking cigarettes in the remote past.  Rare alcohol use.  She has received Red cell transfusions with knee surgery and the colon cancer diagnosis  ROS:   Positives include: Chokes if she talks while eating, diarrhea and nausea prior to the right colectomy, maroon stool prior to colon surgery, fatigue and exertional dyspnea for the past year  A complete ROS was otherwise negative.  Physical Exam:  Blood pressure (!) 129/58, pulse 76, temperature 98.2 F (36.8 C), temperature source Temporal, resp. rate 18, height 5\' 5"  (1.651 m), weight 126 lb (57.2 kg), SpO2 98%.  HEENT: Oropharynx without visible mass, palpable goiter? Lungs: Clear bilaterally, no respiratory distress Cardiac: Regular rate and rhythm Abdomen: No hepatosplenomegaly, no mass, healed surgical incisions, nontender  Vascular: Trace edema at the left greater than right lower leg, no erythema Lymph nodes: No cervical, supraclavicular, axillary, or inguinal nodes Neurologic: Alert and oriented, the motor exam appears intact in the upper and lower extremities bilaterally Skin: No rash Musculoskeletal: No spine tenderness   LAB:  CBC  Lab Results  Component Value Date   WBC 8.8 10/08/2023   HGB 10.7 (L) 10/08/2023   HCT 35.1 (L) 10/08/2023   MCV 87.3 10/08/2023   PLT 477 (H) 10/08/2023   NEUTROABS 4.7 10/08/2023        CMP  Lab Results  Component Value Date   NA 135 09/25/2023   K 3.6 09/25/2023   CL 97 (L) 09/25/2023   CO2 29 09/25/2023   GLUCOSE 101 (H) 09/25/2023   BUN 19 09/25/2023   CREATININE 0.86 09/25/2023   CALCIUM 9.3 09/25/2023   PROT 6.2 (L) 09/25/2023   ALBUMIN 2.9 (L) 09/25/2023   AST 13 (L) 09/25/2023   ALT 10 09/25/2023   ALKPHOS 52 09/25/2023   BILITOT 0.7 09/25/2023   GFRNONAA >60 09/25/2023   GFRAA 44 (L) 02/22/2020     Lab Results  Component  Value Date   CEA1 10.9 (H) 09/21/2023    Imaging: As per HPI, CT images from 09/18/2023 reviewed with Ms. Amedee    Assessment/Plan:   Colon cancer, cecum, stage IIb (pT3 pN0), status post a right colectomy 09/21/2023 Moderate to poorly differentiated adenocarcinoma with mucinous features, tumor extends into the pericolonic adipose tissue undermining the terminal ileum/ileocecal valve-no definite serosal involvement, 0/17 nodes, loss of MLH1 and PMS2 expression, MSI high CT angiogram bleeding study 09/18/2023: Irregular mass at the cecum/ascending colon, no distant metastatic disease in the abdomen or pelvis CTs 09/23/2023: Referral right hemicolectomy, no evidence of metastatic disease Iron deficiency anemia secondary to #1 Left breast cancer, T1c N0, stage Ia, grade 2, ER/PR positive, HER2 negative, Ki-67 10% Left lumpectomy 09/06/2020 Anastrozole March  2022  4.   Family history of breast and prostate cancer Genetic panel 11/06/2020: VUS in the MSH6 gene 5.  Nodular goiter 6.  Atrial fibrillation 7.  Hypertension 8.  Hyperlipidemia    Disposition:   Ms. Dibbern has been diagnosed with colon cancer after presenting with symptomatic anemia and GI bleeding.  She underwent a right colectomy for a stage IIb colon cancer.  The tumor did not have associated high risk features.  She has a good prognosis for a long-term disease-free survival.  I do not recommend adjuvant chemotherapy.  The tumor returned MSI high with loss of MLH1 and PMS2 expression.  This confers an improved prognosis and lack of response to 5-fluorouracil based chemotherapy.  This pattern is most commonly seen with sporadic tumors.  We will submit MLH1 methylation testing to confirm the sporadic nature of this tumor.  Ms. Deller previously had genetic testing when she was diagnosed with breast cancer.  She does not appear to have hereditary nonpolyposis colon cancer syndrome, but her family members are increased risk of  developing colorectal cancer and should receive appropriate screening.  She will return to the lab for a CEA and CBC today.  She will be scheduled for an office visit and CEA in 6 months.  She will continue follow-up with Dr. Pamelia Hoit for breast cancer.  She would be a candidate for immunotherapy if she developed recurrent colon cancer.  Thornton Papas, MD  10/08/2023, 4:52 PM

## 2023-10-09 ENCOUNTER — Telehealth: Payer: Self-pay | Admitting: *Deleted

## 2023-10-09 DIAGNOSIS — C180 Malignant neoplasm of cecum: Secondary | ICD-10-CM

## 2023-10-09 NOTE — Telephone Encounter (Signed)
-----   Message from Thornton Papas sent at 10/08/2023  5:22 PM EST ----- Please call patient, the CEA is normal, the hemoglobin is mildly low-stable compared to discharge in the hospital, call for symptoms of anemia, follow-up as scheduled, and CBC next visit

## 2023-10-09 NOTE — Telephone Encounter (Signed)
 LVM for patient that RN will send MyChart message with MD interpretation of her labs.

## 2023-10-13 LAB — MOLECULAR PATHOLOGY

## 2023-10-18 ENCOUNTER — Other Ambulatory Visit: Payer: Self-pay | Admitting: Cardiology

## 2023-10-22 LAB — MOLECULAR PATHOLOGY

## 2023-11-09 LAB — LAB REPORT - SCANNED
A1c: 5.4
EGFR: 68

## 2023-11-10 ENCOUNTER — Ambulatory Visit: Payer: Medicare HMO | Admitting: Cardiology

## 2023-11-17 ENCOUNTER — Other Ambulatory Visit: Payer: Self-pay | Admitting: Cardiology

## 2023-12-10 ENCOUNTER — Other Ambulatory Visit: Payer: Self-pay | Admitting: Cardiology

## 2023-12-25 ENCOUNTER — Encounter: Payer: Self-pay | Admitting: Cardiology

## 2023-12-25 ENCOUNTER — Ambulatory Visit: Attending: Cardiology | Admitting: Cardiology

## 2023-12-25 VITALS — BP 142/66 | HR 84 | Ht 65.0 in | Wt 124.8 lb

## 2023-12-25 DIAGNOSIS — I10 Essential (primary) hypertension: Secondary | ICD-10-CM | POA: Diagnosis not present

## 2023-12-25 DIAGNOSIS — I4821 Permanent atrial fibrillation: Secondary | ICD-10-CM | POA: Diagnosis not present

## 2023-12-25 DIAGNOSIS — E785 Hyperlipidemia, unspecified: Secondary | ICD-10-CM

## 2023-12-25 DIAGNOSIS — R0602 Shortness of breath: Secondary | ICD-10-CM | POA: Diagnosis not present

## 2023-12-25 DIAGNOSIS — I059 Rheumatic mitral valve disease, unspecified: Secondary | ICD-10-CM

## 2023-12-25 DIAGNOSIS — I351 Nonrheumatic aortic (valve) insufficiency: Secondary | ICD-10-CM

## 2023-12-25 NOTE — Progress Notes (Signed)
 Cardiology Office Note:    Date:  12/25/2023   ID:  Mallory Stephens, DOB 10-May-1933, MRN [id number]  PCP:  Olin Bertin, MD  Cardiologist:  Gaylyn Keas, MD    Referring MD: Olin Bertin, MD   Chief Complaint  Patient presents with   Atrial Fibrillation   Hypertension   Hyperlipidemia   Shortness of Breath   Aortic Stenosis   Mitral Regurgitation   Aortic Insuffiency    History of Present Illness:    Mallory Stephens is a 89 y.o. female with a hx of chronic atrial fibrillation and HTN.  Since I saw her last she presented with GI bleed and Hbg 7.3 and was found to have a cecal mass that later was found to be adenoCA and underwent lap right colectomy.  Ca was stage 2b with no distant mets and no chemo recommended.  She is here today for followup and is doing well.  She denies any chest pain or pressure, SOB, DOE (except with significant exertion), PND, orthopnea, palpitations or syncope. She occasionally has some intermittent LE edema.  She has no lightheadedness but has balance issues. She is compliant with her meds and is tolerating meds with no SE.     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 Marguerita Shih   Malignant neoplasm of upper-inner quadrant of left breast in female, 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/2020   Mitral regurgitation    mild by echo 08/2023   Mitral stenosis     mean MVG by echo 08/2023   Multinodular goiter    Osteoarthritis    Permanent atrial fibrillation (HCC)    atrial fibrillation s/p DCCV 3/10 and 6/10   PONV (postoperative nausea and vomiting)    PUD (peptic ulcer disease)    Pulmonary HTN (HCC)    mild by echo 09/2023 PASP   Pulmonary nodules    followed  by Dr. Alexia Angelucci   UTI (urinary tract infection) 04/2017    Past Surgical History:  Procedure Laterality Date   aspiration of cyst  1978   BREAST LUMPECTOMY Left 09/2020   BREAST LUMPECTOMY WITH RADIOACTIVE SEED LOCALIZATION Left 09/06/2020   Procedure: LEFT BREAST LUMPECTOMY WITH RADIOACTIVE SEED LOCALIZATION;  Surgeon: Lockie Rima, MD;  Location: [facility];  Service: General;  Laterality: Left;   BREAST SURGERY  1962   right breast for benign lesion   BREAST SURGERY  1971   bilateral lumpectomy for beingn lesions   CARDIOVERSION  07/22/2011   Procedure: CARDIOVERSION;  Surgeon: Jacqueline Matsu, MD;  Location: [facility];  Service: Cardiovascular;  Laterality: N/A;   CARDIOVERSION  09/02/2011   Procedure: CARDIOVERSION;  Surgeon: Jacqueline Matsu, MD;  Location: [facility] OR;  Service: Cardiovascular;  Laterality: N/A;   cataract     cataract removal-bilateral   CHOLECYSTECTOMY  2003  CYSTOSCOPY     DILATION AND CURETTAGE OF UTERUS  1979   JOINT REPLACEMENT  2002   Right knee   JOINT REPLACEMENT  2007   left knee   LAPAROSCOPIC PARTIAL COLECTOMY N/A 09/21/2023   Procedure: LAPAROSCOPIC PARTIAL COLECTOMY;  Surgeon: Candyce Champagne, MD;  Location: [facility];  Service: General;  Laterality: N/A;    Current Medications: No outpatient medications have been marked as taking for the 12/25/23 encounter (Office Visit) with Jacqueline Matsu, MD.     Allergies:   Sulfa antibiotics and Sulfa drugs cross reactors   Social History   Socioeconomic History   Marital status: Widowed    Spouse name: Not on file   Number of children: Not on file   Years of education: Not on file   Highest  education level: Not on file  Occupational History   Not on file  Tobacco Use   Smoking status: Former    Current packs/day: 1.00    Average packs/day: 1 pack/day for 30.0 years (30.0 ttl pk-yrs)    Types: Cigarettes   Smokeless tobacco: Never  Vaping Use   Vaping status: Never Used  Substance and Sexual Activity   Alcohol  use: Yes    Comment: occasional wine   Drug use: No   Sexual activity: Not on file  Other Topics Concern   Not on file  Social History Narrative   ** Merged History Encounter **       Social Drivers of Health   Financial Resource Strain: Not on file  Food Insecurity: No Food Insecurity (10/08/2023)   Hunger Vital Sign    Worried About Running Out of Food in the Last Year: Never true    Ran Out of Food in the Last Year: Never true  Transportation Needs: No Transportation Needs (10/08/2023)   PRAPARE - Administrator, Civil Service (Medical): No    Lack of Transportation (Non-Medical): No  Physical Activity: Not on file  Stress: Not on file  Social Connections: Socially Isolated (09/18/2023)   Social Connection and Isolation Panel [NHANES]    Frequency of Communication with Friends and Family: More than three times a week    Frequency of Social Gatherings with Friends and Family: More than three times a week    Attends Religious Services: Never    Database administrator or Organizations: No    Attends Banker Meetings: Never    Marital Status: Widowed     Family History: The patient's family history includes Arthritis in her sister; Breast cancer in her niece, niece, and sister; Heart disease in her brother; Prostate cancer in her brother, brother, and nephew; Scoliosis in her sister; Stroke in her father and mother. There is no history of Autoimmune disease.  ROS:   Please see the history of present illness.    Review of Systems  Musculoskeletal:  Negative for falls, gout and muscle cramps.    All other systems reviewed and  negative.   EKGs/Labs/Other Studies Reviewed:    The following studies were reviewed today:  labs    Recent Labs: 02/27/2023: TSH 0.631 08/13/2023: B Natriuretic Peptide 897.0 09/22/2023: Magnesium  1.8 09/25/2023: ALT 10; BUN 19; Creatinine, Ser 0.86; Potassium 3.6; Sodium 135 10/08/2023: Hemoglobin 10.7; Platelet Count 477   Recent Lipid Panel    Component Value Date/Time   CHOL 133 06/01/2019 0958   TRIG 85 06/01/2019 0958   HDL 45 06/01/2019 0958   CHOLHDL 3.0 06/01/2019 0958   CHOLHDL  3 02/16/2014 0752   VLDL 22.4 02/16/2014 0752   LDLCALC 72 06/01/2019 0958    Physical Exam:    VS:  There were no vitals taken for this visit.    Wt Readings from Last 3 Encounters:  10/08/23 126 lb (57.2 kg)  09/21/23 126 lb 15.8 oz (57.6 kg)  08/17/23 121 lb 14.6 oz (55.3 kg)    GEN: Well nourished, well developed in no acute distress HEENT: Normal NECK: No JVD; No carotid bruits LYMPHATICS: No lymphadenopathy CARDIAC:irregularly irregular, no murmurs, rubs, gallops RESPIRATORY:  Clear to auscultation without rales, wheezing or rhonchi  ABDOMEN: Soft, non-tender, non-distended MUSCULOSKELETAL:  No edema; No deformity  SKIN: Warm and dry NEUROLOGIC:  Alert and oriented x 3 PSYCHIATRIC:  Normal affect  ASSESSMENT:    1. Permanent atrial fibrillation (HCC)   2. Essential hypertension   3. Hyperlipidemia, unspecified hyperlipidemia type   4. SOB (shortness of breath)   5. Nonrheumatic aortic valve insufficiency   6. Mitral valve disease     PLAN:    In order of problems listed above:  1.  Permanent atrial fibrillation -She remains in normal sinus rhythm with good heart rate control -No stated bleeding problems on DOAC -I have personally reviewed and interpreted outside labs performed by patient's PCP which showed serum creatinine 0.86 09/25/2023 and potassium 3.9 and Hbg 12.3 on  and 11/09/2023 -Continue apixaban  2.5 mg twice daily (dosed for age greater than 80 and weight less  than 60 kg), Lopressor  25 mg twice daily with as needed refills  2.  HTN - BP controlled today - Continue Lopressor  25 mg twice daily and Losartan  25mg  daily with as needed refills  3. HLD -followed by PCP -LDL goal less than 100 -I have personally reviewed and interpreted outside labs performed by patient's PCP which showed LDL 101, HDL 47 and ALT 8 on 11/09/2023 -Continue pravastatin  20 mg daily with as needed refills  4.  SOB/Mild AS/Mild MS - Lexiscan  Myoview  2024 showed no ischemia -2D echo 08/24/2023 showed EF 55 to 60% with mild LVH, normal RV function with moderate RV enlargement, mild MR, mild mitral stenosis and mild aortic valve stenosis  5.  Aortic insuff/aortic valve stenosis - 2D echo 1/25 with mild aortic valve stenosis mean aortic valve gradient 11 mmHg and trivial AI  6.  Mitral stenosis/mitral regurgitation - 2D echo 08/24/2023 showed mild mitral stenosis with mean mitral valve gradient 7.8 mmHg and mild MR - Repeat echo in 1 year   Medication Adjustments/Labs and Tests Ordered: Current medicines are reviewed at length with the patient today.  Concerns regarding medicines are outlined above.  No orders of the defined types were placed in this encounter.  No orders of the defined types were placed in this encounter.   Signed, Gaylyn Keas, MD  12/25/2023 2:51 PM    [facility]

## 2023-12-25 NOTE — Addendum Note (Signed)
 Addended by: Cherylyn Cos on: 12/25/2023 03:47 PM   Modules accepted: Orders

## 2023-12-25 NOTE — Patient Instructions (Signed)
 Medication Instructions:  Your physician recommends that you continue on your current medications as directed. Please refer to the Current Medication list given to you today.  *If you need a refill on your cardiac medications before your next appointment, please call your pharmacy*  Lab Work: None.  If you have labs (blood work) drawn today and your tests are completely normal, you will receive your results only by: MyChart Message (if you have MyChart) OR A paper copy in the mail If you have any lab test that is abnormal or we need to change your treatment, we will call you to review the results.  Testing/Procedures: Your physician has requested that you have an echocardiogram in 1 year. Echocardiography is a painless test that uses sound waves to create images of your heart. It provides your doctor with information about the size and shape of your heart and how well your heart's chambers and valves are working. This procedure takes approximately one hour. There are no restrictions for this procedure. Please do NOT wear cologne, perfume, aftershave, or lotions (deodorant is allowed). Please arrive 15 minutes prior to your appointment time.  Please note: We ask at that you not bring children with you during ultrasound (echo/ vascular) testing. Due to room size and safety concerns, children are not allowed in the ultrasound rooms during exams. Our front office staff cannot provide observation of children in our lobby area while testing is being conducted. An adult accompanying a patient to their appointment will only be allowed in the ultrasound room at the discretion of the ultrasound technician under special circumstances. We apologize for any inconvenience.   Follow-Up: At [facility], you and your health needs are our priority.  As part of our continuing mission to provide you with exceptional heart care, our providers are all part of one team.  This team includes your primary  Cardiologist (physician) and Advanced Practice Providers or APPs (Physician Assistants and Nurse Practitioners) who all work together to provide you with the care you need, when you need it.  Your next appointment:   1 year(s)  Provider:   Gaylyn Keas, MD

## 2023-12-25 NOTE — Addendum Note (Signed)
 Addended by: Cherylyn Cos on: 12/25/2023 03:27 PM   Modules accepted: Orders

## 2024-01-11 ENCOUNTER — Other Ambulatory Visit: Payer: Self-pay

## 2024-01-11 MED ORDER — METOPROLOL TARTRATE 25 MG PO TABS: 25.0000 mg | ORAL_TABLET | Freq: Two times a day (BID) | ORAL | 3 refills | Status: AC

## 2024-03-06 ENCOUNTER — Other Ambulatory Visit: Payer: Self-pay | Admitting: Cardiology

## 2024-03-06 DIAGNOSIS — I4821 Permanent atrial fibrillation: Secondary | ICD-10-CM

## 2024-03-19 ENCOUNTER — Other Ambulatory Visit: Payer: Self-pay | Admitting: Cardiology

## 2024-03-19 DIAGNOSIS — I4821 Permanent atrial fibrillation: Secondary | ICD-10-CM

## 2024-03-24 ENCOUNTER — Telehealth: Payer: Self-pay | Admitting: Cardiology

## 2024-03-24 MED ORDER — APIXABAN 2.5 MG PO TABS: 2.5000 mg | ORAL_TABLET | Freq: Two times a day (BID) | ORAL | 1 refills | Status: AC

## 2024-03-24 NOTE — Telephone Encounter (Signed)
*  STAT* If patient is at the pharmacy, call can be transferred to refill team.   1. Which medications need to be refilled? (please list name of each medication and dose if known)   apixaban  (ELIQUIS ) 2.5 MG TABS tablet   2. Would you like to learn more about the convenience, safety, & potential cost savings by using the [facility]?   3. Are you open to using the [facility] (Type [facility]. ).  4. Which pharmacy/location (including street and city if local pharmacy) is medication to be sent to?  [facility] - [place], [place] - [address] [facility] AT Portal to Registered [facility] Sites   5. Do they need a 30 day or 90 day supply?   90 day  Patient state she has a week's supply left of this medication.

## 2024-04-15 ENCOUNTER — Inpatient Hospital Stay (HOSPITAL_BASED_OUTPATIENT_CLINIC_OR_DEPARTMENT_OTHER): Admitting: Oncology

## 2024-04-15 ENCOUNTER — Inpatient Hospital Stay

## 2024-04-15 ENCOUNTER — Inpatient Hospital Stay: Attending: Oncology

## 2024-04-15 VITALS — BP 134/57 | HR 57 | Temp 97.8°F | Resp 18 | Ht 65.0 in | Wt 130.9 lb

## 2024-04-15 DIAGNOSIS — Z1721 Progesterone receptor positive status: Secondary | ICD-10-CM | POA: Insufficient documentation

## 2024-04-15 DIAGNOSIS — D509 Iron deficiency anemia, unspecified: Secondary | ICD-10-CM | POA: Diagnosis present

## 2024-04-15 DIAGNOSIS — Z79811 Long term (current) use of aromatase inhibitors: Secondary | ICD-10-CM | POA: Diagnosis not present

## 2024-04-15 DIAGNOSIS — Z23 Encounter for immunization: Secondary | ICD-10-CM | POA: Diagnosis not present

## 2024-04-15 DIAGNOSIS — Z85038 Personal history of other malignant neoplasm of large intestine: Secondary | ICD-10-CM | POA: Insufficient documentation

## 2024-04-15 DIAGNOSIS — I10 Essential (primary) hypertension: Secondary | ICD-10-CM | POA: Diagnosis not present

## 2024-04-15 DIAGNOSIS — C180 Malignant neoplasm of cecum: Secondary | ICD-10-CM | POA: Diagnosis not present

## 2024-04-15 DIAGNOSIS — Z170 Estrogen receptor positive status [ER+]: Secondary | ICD-10-CM | POA: Diagnosis not present

## 2024-04-15 DIAGNOSIS — E785 Hyperlipidemia, unspecified: Secondary | ICD-10-CM | POA: Insufficient documentation

## 2024-04-15 DIAGNOSIS — E049 Nontoxic goiter, unspecified: Secondary | ICD-10-CM | POA: Diagnosis not present

## 2024-04-15 DIAGNOSIS — Z1732 Human epidermal growth factor receptor 2 negative status: Secondary | ICD-10-CM | POA: Diagnosis not present

## 2024-04-15 DIAGNOSIS — I4891 Unspecified atrial fibrillation: Secondary | ICD-10-CM | POA: Diagnosis not present

## 2024-04-15 DIAGNOSIS — C50912 Malignant neoplasm of unspecified site of left female breast: Secondary | ICD-10-CM | POA: Insufficient documentation

## 2024-04-15 LAB — CBC WITH DIFFERENTIAL (CANCER CENTER ONLY)
Abs Immature Granulocytes: 0.03 K/uL (ref 0.00–0.07)
Basophils Absolute: 0.1 K/uL (ref 0.0–0.1)
Basophils Relative: 1 %
Eosinophils Absolute: 0.3 K/uL (ref 0.0–0.5)
Eosinophils Relative: 2 %
HCT: 42 % (ref 36.0–46.0)
Hemoglobin: 14.1 g/dL (ref 12.0–15.0)
Immature Granulocytes: 0 %
Lymphocytes Relative: 40 %
Lymphs Abs: 4.5 K/uL — ABNORMAL HIGH (ref 0.7–4.0)
MCH: 31 pg (ref 26.0–34.0)
MCHC: 33.6 g/dL (ref 30.0–36.0)
MCV: 92.3 fL (ref 80.0–100.0)
Monocytes Absolute: 0.8 K/uL (ref 0.1–1.0)
Monocytes Relative: 7 %
Neutro Abs: 5.5 K/uL (ref 1.7–7.7)
Neutrophils Relative %: 50 %
Platelet Count: 222 K/uL (ref 150–400)
RBC: 4.55 MIL/uL (ref 3.87–5.11)
RDW: 13.2 % (ref 11.5–15.5)
WBC Count: 11.3 K/uL — ABNORMAL HIGH (ref 4.0–10.5)
nRBC: 0 % (ref 0.0–0.2)

## 2024-04-15 LAB — CEA (ACCESS): CEA (CHCC): 2.53 ng/mL (ref 0.00–5.00)

## 2024-04-15 MED ORDER — INFLUENZA VAC SPLIT HIGH-DOSE 0.5 ML IM SUSY
0.5000 mL | PREFILLED_SYRINGE | Freq: Once | INTRAMUSCULAR | Status: AC
  Administered 2024-04-15: 0.5 mL via INTRAMUSCULAR
  Filled 2024-04-15: qty 0.5

## 2024-04-15 NOTE — Progress Notes (Signed)
  [facility] OFFICE PROGRESS NOTE   Diagnosis: Colon cancer  INTERVAL HISTORY:   Mallory Stephens returns as scheduled.  She feels well.  No difficulty with bowel function.  She continues anastrozole .  She is losing hair and relates this to anastrozole  therapy.  She has decided against a surveillance colonoscopy.  She has arthralgias.  Objective:  Vital signs in last 24 hours:  Blood pressure (!) 134/57, pulse (!) 57, temperature 97.8 F (36.6 C), temperature source Temporal, resp. rate 18, height 5' 5 (1.651 m), weight 130 lb 14.4 oz (59.4 kg), SpO2 98%.   Lymphatics: No cervical, supraclavicular, axillary, or inguinal nodes Resp: Lungs clear bilaterally Cardio: Irregular GI: No hepatosplenomegaly, nontender, no mass Vascular: No leg edema   Lab Results:  Lab Results  Component Value Date   WBC 11.3 (H) 04/15/2024   HGB 14.1 04/15/2024   HCT 42.0 04/15/2024   MCV 92.3 04/15/2024   PLT 222 04/15/2024   NEUTROABS 5.5 04/15/2024    CMP  Lab Results  Component Value Date   NA 135 09/25/2023   K 3.6 09/25/2023   CL 97 (L) 09/25/2023   CO2 29 09/25/2023   GLUCOSE 101 (H) 09/25/2023   BUN 19 09/25/2023   CREATININE 0.86 09/25/2023   CALCIUM  9.3 09/25/2023   PROT 6.2 (L) 09/25/2023   ALBUMIN 2.9 (L) 09/25/2023   AST 13 (L) 09/25/2023   ALT 10 09/25/2023   ALKPHOS 52 09/25/2023   BILITOT 0.7 09/25/2023   GFRNONAA >60 09/25/2023   GFRAA 44 (L) 02/22/2020    Lab Results  Component Value Date   CEA1 10.9 (H) 09/21/2023   CEA 2.53 04/15/2024     No results found.  Medications: I have reviewed the patient's current medications.   Assessment/Plan: Colon cancer, cecum, stage IIb (pT3 pN0), status post a right colectomy 09/21/2023 Moderate to poorly differentiated adenocarcinoma with mucinous features, tumor extends into the pericolonic adipose tissue undermining the terminal ileum/ileocecal valve-no definite serosal involvement, 0/17 nodes, loss of  MLH1 and PMS2 expression, MSI high, MLH1 hypermethylation present CT angiogram bleeding study 09/18/2023: Irregular mass at the cecum/ascending colon, no distant metastatic disease in the abdomen or pelvis CTs 09/23/2023: Referral right hemicolectomy, no evidence of metastatic disease Iron  deficiency anemia secondary to #1 Left breast cancer, T1c N0, stage Ia, grade 2, ER/PR positive, HER2 negative, Ki-67 10% Left lumpectomy 09/06/2020 Anastrozole  March 2022  4.   Family history of breast and prostate cancer Genetic panel 11/06/2020: VUS in the MSH6 gene 5.  Nodular goiter 6.  Atrial fibrillation 7.  Hypertension 8.  Hyperlipidemia     Disposition: Mallory Stephens is in clinical remission from colon cancer.  The CEA is normal.  Iron  deficiency anemia has corrected following colon surgery.  She will return for an office visit and CEA in 6 months.  She has decided against a surveillance colonoscopy.  She would like continue follow-up with Dr. Gudena for breast cancer.  She will schedule appointment for November of this year.  The hair loss may be related to aging or anastrozole .  Arley Hof, MD  04/15/2024  12:22 PM

## 2024-05-15 ENCOUNTER — Other Ambulatory Visit: Payer: Self-pay | Admitting: Hematology and Oncology

## 2024-06-20 ENCOUNTER — Inpatient Hospital Stay

## 2024-06-21 ENCOUNTER — Inpatient Hospital Stay: Attending: Oncology | Admitting: Hematology and Oncology

## 2024-06-21 ENCOUNTER — Inpatient Hospital Stay

## 2024-06-21 VITALS — BP 173/63 | HR 61 | Temp 98.2°F | Resp 20 | Ht 65.0 in

## 2024-06-21 DIAGNOSIS — M2560 Stiffness of unspecified joint, not elsewhere classified: Secondary | ICD-10-CM | POA: Insufficient documentation

## 2024-06-21 DIAGNOSIS — D509 Iron deficiency anemia, unspecified: Secondary | ICD-10-CM | POA: Insufficient documentation

## 2024-06-21 DIAGNOSIS — Z882 Allergy status to sulfonamides status: Secondary | ICD-10-CM | POA: Insufficient documentation

## 2024-06-21 DIAGNOSIS — D649 Anemia, unspecified: Secondary | ICD-10-CM | POA: Insufficient documentation

## 2024-06-21 DIAGNOSIS — Z9049 Acquired absence of other specified parts of digestive tract: Secondary | ICD-10-CM | POA: Diagnosis not present

## 2024-06-21 DIAGNOSIS — C180 Malignant neoplasm of cecum: Secondary | ICD-10-CM | POA: Insufficient documentation

## 2024-06-21 DIAGNOSIS — Z170 Estrogen receptor positive status [ER+]: Secondary | ICD-10-CM

## 2024-06-21 DIAGNOSIS — C50212 Malignant neoplasm of upper-inner quadrant of left female breast: Secondary | ICD-10-CM

## 2024-06-21 DIAGNOSIS — M810 Age-related osteoporosis without current pathological fracture: Secondary | ICD-10-CM | POA: Diagnosis not present

## 2024-06-21 DIAGNOSIS — Z79899 Other long term (current) drug therapy: Secondary | ICD-10-CM | POA: Diagnosis not present

## 2024-06-21 DIAGNOSIS — Z7901 Long term (current) use of anticoagulants: Secondary | ICD-10-CM | POA: Insufficient documentation

## 2024-06-21 DIAGNOSIS — Z853 Personal history of malignant neoplasm of breast: Secondary | ICD-10-CM | POA: Diagnosis not present

## 2024-06-21 LAB — CBC WITH DIFFERENTIAL (CANCER CENTER ONLY)
Abs Immature Granulocytes: 0.03 K/uL (ref 0.00–0.07)
Basophils Absolute: 0.1 K/uL (ref 0.0–0.1)
Basophils Relative: 1 %
Eosinophils Absolute: 0.3 K/uL (ref 0.0–0.5)
Eosinophils Relative: 2 %
HCT: 42.8 % (ref 36.0–46.0)
Hemoglobin: 14.4 g/dL (ref 12.0–15.0)
Immature Granulocytes: 0 %
Lymphocytes Relative: 40 %
Lymphs Abs: 4.9 K/uL — ABNORMAL HIGH (ref 0.7–4.0)
MCH: 30.5 pg (ref 26.0–34.0)
MCHC: 33.6 g/dL (ref 30.0–36.0)
MCV: 90.7 fL (ref 80.0–100.0)
Monocytes Absolute: 0.7 K/uL (ref 0.1–1.0)
Monocytes Relative: 6 %
Neutro Abs: 6.1 K/uL (ref 1.7–7.7)
Neutrophils Relative %: 51 %
Platelet Count: 239 K/uL (ref 150–400)
RBC: 4.72 MIL/uL (ref 3.87–5.11)
RDW: 13.1 % (ref 11.5–15.5)
WBC Count: 12 K/uL — ABNORMAL HIGH (ref 4.0–10.5)
nRBC: 0 % (ref 0.0–0.2)

## 2024-06-21 LAB — FERRITIN: Ferritin: 102 ng/mL (ref 11–307)

## 2024-06-21 LAB — RETIC PANEL
Immature Retic Fract: 11.4 % (ref 2.3–15.9)
RBC.: 4.67 MIL/uL (ref 3.87–5.11)
Retic Count, Absolute: 71.5 K/uL (ref 19.0–186.0)
Retic Ct Pct: 1.5 % (ref 0.4–3.1)
Reticulocyte Hemoglobin: 34.5 pg (ref >27.9)

## 2024-06-21 LAB — IRON AND IRON BINDING CAPACITY (CC-WL,HP ONLY)
Iron: 90 ug/dL (ref 28–170)
Saturation Ratios: 28 % (ref 10.4–31.8)
TIBC: 325 ug/dL (ref 250–450)
UIBC: 235 ug/dL

## 2024-06-21 LAB — DIRECT ANTIGLOBULIN TEST (NOT AT ARMC)
DAT, IgG: NEGATIVE
DAT, complement: NEGATIVE

## 2024-06-21 NOTE — Assessment & Plan Note (Signed)
 07/24/2020: Left breast UIQ T1CN0 stage Ia grade 2 IDC ER/PR positive HER2 negative Ki-67 10% 09/06/2020: Left lumpectomy: T1CNX grade 2 IDC with negative margins, opted against radiation 11/26/2020: Genetics: Negative   Current treatment: Anastrozole  started 10/02/2020 Anastrozole  toxicities: Osteoporosis: T score -2.5 on 04/22/2021 currently on ibandronate  I recommend anastrozole  for 1 more year  Breast cancer surveillance: Mammogram 08/03/2023: Benign breast density category C Breast exam 06/21/2024: Benign  Colon cancer stage IIb T3 N0 status post right colectomy 09/21/2023: Moderately poorly differentiated carcinoma with mucinous features 0/17 lymph nodes: Follows with Dr. Cloretta  I discussed with her that she could be followed with Dr. Cloretta for both: Breast cancers.

## 2024-06-21 NOTE — Progress Notes (Signed)
 Patient Care Team: Verena Mems, MD as PCP - General (Family Medicine) Shlomo Wilbert SAUNDERS, MD as PCP - Cardiology (Cardiology) Dewey Rush, MD as Consulting Physician (Radiation Oncology) Shlomo Wilbert SAUNDERS, MD as Consulting Physician (Cardiology) Barbette Knock, MD as Consulting Physician (Obstetrics and Gynecology) Ivin Kocher, MD as Referring Physician (Dermatology) Kriss Estefana DEL, DO as Consulting Physician (Gastroenterology) Sheldon Standing, MD as Consulting Physician (Colon and Rectal Surgery) Aron Shoulders, MD as Consulting Physician (Surgical Oncology) Caleen Dirks, MD as Consulting Physician (Internal Medicine) Cloretta Arley NOVAK, MD as Consulting Physician (Oncology)  DIAGNOSIS:  Encounter Diagnosis  Name Primary?   Malignant neoplasm of upper-inner quadrant of left breast in female, estrogen receptor positive (HCC) Yes    SUMMARY OF ONCOLOGIC HISTORY: Oncology History  Malignant neoplasm of upper-inner quadrant of left breast in female, estrogen receptor positive (HCC)  07/24/2020 Initial Diagnosis   07/24/2020: Left breast UIQ T1CN0 stage Ia grade 2 IDC ER/PR positive HER2 negative Ki-67 10%    08/23/2020 Cancer Staging   Staging form: Breast, AJCC 8th Edition - Clinical stage from 08/23/2020: Stage IA (cT1c, cN0, cM0, G2, ER+, PR+, HER2-) - Signed by Layla Sandria BROCKS, MD on 08/29/2020   09/06/2020 Surgery   Left lumpectomy: T1c, Nx grade 2 IDC, negative margins, declined radiation   10/2020 -  Anti-estrogen oral therapy   Anastrozole    11/26/2020 Genetic Testing   Negative genetic testing:  No pathogenic variants detected on the [employer] CancerNext-Expanded + RNAinsight panel. A variant of uncertain significance (VUS) was detected in the MSH6 gene called [id number] (c.3930G>C). The report date is 11/26/2020.  The CancerNext-Expanded + RNAinsight gene panel offered by [employer] and includes sequencing and rearrangement analysis for the following 77 genes: AIP, ALK,  APC, ATM, AXIN2, BAP1, BARD1, BLM, BMPR1A, BRCA1, BRCA2, BRIP1, CDC73, CDH1, CDK4, CDKN1B, CDKN2A, CHEK2, CTNNA1, DICER1, FANCC, FH, FLCN, GALNT12, KIF1B, LZTR1, MAX, MEN1, MET, MLH1, MSH2, MSH3, MSH6, MUTYH, NBN, NF1, NF2, NTHL1, PALB2, PHOX2B, PMS2, POT1, PRKAR1A, PTCH1, PTEN, RAD51C, RAD51D, RB1, RECQL, RET, SDHA, SDHAF2, SDHB, SDHC, SDHD, SMAD4, SMARCA4, SMARCB1, SMARCE1, STK11, SUFU, TMEM127, TP53, TSC1, TSC2, VHL and XRCC2 (sequencing and deletion/duplication); EGFR, EGLN1, HOXB13, KIT, MITF, PDGFRA, POLD1 and POLE (sequencing only); EPCAM and GREM1 (deletion/duplication only). RNA data is routinely analyzed for use in variant interpretation for all genes.    Cecal cancer pT3pN0 with bleeding s/p lap colectomy 09/21/2023  09/22/2023 Initial Diagnosis   Cecal cancer pT3pN0 with bleeding s/p lap colectomy 09/21/2023   10/08/2023 Cancer Staging   Staging form: Colon and Rectum - Neuroendocine Tumors, AJCC 8th Edition - Pathologic: Stage IIB (pT3, pN0, cM0) - Signed by Cloretta Arley NOVAK, MD on 10/08/2023 Histologic grade (G): G2 Histologic grading system: 3 grade system Residual tumor (R): R0 Laterality: Right Lymph-vascular invasion (LVI): LVI not present (absent)/not identified Synchronous tumor location: Cecum     CHIEF COMPLIANT: Follow-up of breast cancer and recent history of colon cancer  HISTORY OF PRESENT ILLNESS: History of Present Illness Mallory Stephens is an 89 year old female with a history of breast and colon cancers who presents for follow-up after surgery and ongoing treatment with anastrozole .  She has recovered well from her surgeries and resumed walking. She continues anastrozole  for breast cancer prevention, with over three years of treatment without significant issues. She experiences joint stiffness and dry skin, which may be related to anastrozole .  She experienced significant weakness and nausea due to anemia prior to surgery, but her condition has improved significantly  post-surgery.  Her hair, which was falling out, is now growing back.  She is monitored for colon cancer annually and typically undergoes mammograms in December, though she has not yet received an appointment for this year.     ALLERGIES:  is allergic to sulfa antibiotics and sulfa drugs cross reactors.  MEDICATIONS:  Current Outpatient Medications  Medication Sig Dispense Refill   acetaminophen  (TYLENOL ) 325 MG tablet Take 325 mg by mouth every 6 (six) hours as needed for moderate pain.     alum & mag hydroxide-simeth (MAALOX/MYLANTA) 200-200-20 MG/5ML suspension Take 30 mLs by mouth every 6 (six) hours as needed for indigestion (gas/bloating). 355 mL 0   anastrozole  (ARIMIDEX ) 1 MG tablet TAKE 1 TABLET DAILY 90 tablet 3   apixaban  (ELIQUIS ) 2.5 MG TABS tablet Take 1 tablet (2.5 mg total) by mouth 2 (two) times daily. 180 tablet 1   cholecalciferol  (VITAMIN D3) 25 MCG (1000 UNIT) tablet Take 1 tablet (1,000 Units total) by mouth daily. 90 tablet 4   famotidine  (PEPCID ) 40 MG tablet Take 40 mg by mouth at bedtime.     furosemide  (LASIX ) 20 MG tablet Take 20 mg by mouth in the morning.     ibandronate  (BONIVA ) 150 MG tablet TAKE 1 TABLET BY MOUTH EVERY 30 DAYS ON THE 10TH DAY OF EACH MONTH IN THE MORNING WITH A FULL GLASS OF WATER,ON AN EMPTY STOMACH AND DO NOT TAKE ANYTHING ELSE BY MOUTH OR LIE DOWN FOR THE NEXT 30 MIN 3 tablet 4   metoprolol  tartrate (LOPRESSOR ) 25 MG tablet Take 1 tablet (25 mg total) by mouth 2 (two) times daily. 180 tablet 3   ondansetron  (ZOFRAN ) 4 MG tablet Take 1 tablet (4 mg total) by mouth every 6 (six) hours as needed for nausea. 20 tablet 0   PARoxetine  (PAXIL ) 10 MG tablet Take 10 mg by mouth every morning.     polycarbophil (FIBERCON) 625 MG tablet Take 1 tablet (625 mg total) by mouth 2 (two) times daily.     polyvinyl alcohol  (LIQUIFILM TEARS) 1.4 % ophthalmic solution Place 1-2 drops into both eyes every 6 (six) hours as needed for dry eyes.     prochlorperazine   (COMPAZINE ) 10 MG tablet Take 1 tablet (10 mg total) by mouth every 6 (six) hours as needed for refractory nausea / vomiting (Use for nausea and / or vomiting unresolved with ondansetron  (Zofran )). 30 tablet 0   solifenacin (VESICARE) 5 MG tablet Take 5 mg by mouth in the morning.     losartan  (COZAAR ) 25 MG tablet Take 25 mg by mouth daily. (Patient not taking: Reported on 06/21/2024)     pravastatin  (PRAVACHOL ) 20 MG tablet Take 20 mg by mouth at bedtime.     No current facility-administered medications for this visit.   Facility-Administered Medications Ordered in Other Visits  Medication Dose Route Frequency Provider Last Rate Last Admin   0.9 %  sodium chloride  infusion   Intravenous Continuous Iruku, Praveena, MD 10 mL/hr at 08/12/23 1427 New Bag at 08/12/23 1427    PHYSICAL EXAMINATION: ECOG PERFORMANCE STATUS: 1 - Symptomatic but completely ambulatory  Vitals:   06/21/24 1428  BP: (!) 173/63  Pulse: 61  Resp: 20  Temp: 98.2 F (36.8 C)  SpO2: 92%   There were no vitals filed for this visit.  Physical Exam VITALS: BP- 140/80  (exam performed in the presence of a chaperone)  LABORATORY DATA:  I have reviewed the data as listed    Latest Ref Rng & Units  09/25/2023    4:41 PM 09/23/2023    5:09 AM 09/22/2023    9:35 AM  CMP  Glucose 70 - 99 mg/dL 898  885  889   BUN 8 - 23 mg/dL 19  15  12    Creatinine 0.44 - 1.00 mg/dL 9.13  9.05  9.04   Sodium 135 - 145 mmol/L 135  137  136   Potassium 3.5 - 5.1 mmol/L 3.6  3.8  3.3   Chloride 98 - 111 mmol/L 97  98  100   CO2 22 - 32 mmol/L 29  27  26    Calcium  8.9 - 10.3 mg/dL 9.3  9.0  8.5   Total Protein 6.5 - 8.1 g/dL 6.2  5.6  5.4   Total Bilirubin 0.0 - 1.2 mg/dL 0.7  1.0  1.0   Alkaline Phos 38 - 126 U/L 52  39  37   AST 15 - 41 U/L 13  12  13    ALT 0 - 44 U/L 10  10  9      Lab Results  Component Value Date   WBC 12.0 (H) 06/21/2024   HGB 14.4 06/21/2024   HCT 42.8 06/21/2024   MCV 90.7 06/21/2024   PLT 239  06/21/2024   NEUTROABS 6.1 06/21/2024    ASSESSMENT & PLAN:  Malignant neoplasm of upper-inner quadrant of left breast in female, estrogen receptor positive (HCC) 07/24/2020: Left breast UIQ T1CN0 stage Ia grade 2 IDC ER/PR positive HER2 negative Ki-67 10% 09/06/2020: Left lumpectomy: T1CNX grade 2 IDC with negative margins, opted against radiation 11/26/2020: Genetics: Negative   Current treatment: Anastrozole  started 10/02/2020 Anastrozole  toxicities: Osteoporosis: T score -2.5 on 04/22/2021 currently on ibandronate  I recommend anastrozole  for 1 more year  Breast cancer surveillance: Mammogram 08/03/2023: Benign breast density category C Breast exam 06/21/2024: Benign  Colon cancer stage IIb T3 N0 status post right colectomy 09/21/2023: Moderately poorly differentiated carcinoma with mucinous features 0/17 lymph nodes: Follows with Dr. Cloretta  I discussed with her that she could be followed with Dr. Cloretta for both: Breast cancers. I sent a request for a follow-up with Dr. Cloretta in September 2026.  Orders Placed This Encounter  Procedures   MM Digital Screening    Standing Status:   Future    Expected Date:   08/09/2024    Expiration Date:   06/21/2025    Reason for Exam (SYMPTOM  OR DIAGNOSIS REQUIRED):   Annual mammograms    [facility]    Release to patient:   Immediate [1]   The patient has a good understanding of the overall plan. she agrees with it. she will call with any problems that may develop before the next visit here.  I personally spent a total of 30 minutes in the care of the patient today including preparing to see the patient, getting/reviewing separately obtained history, performing a medically appropriate exam/evaluation, counseling and educating, placing orders, referring and communicating with other health care professionals, documenting clinical information in the EHR, independently interpreting results, communicating  results, and coordinating care.   Viinay K Janet Decesare, MD 06/21/24

## 2024-07-08 ENCOUNTER — Emergency Department (HOSPITAL_BASED_OUTPATIENT_CLINIC_OR_DEPARTMENT_OTHER)

## 2024-07-08 ENCOUNTER — Emergency Department (HOSPITAL_BASED_OUTPATIENT_CLINIC_OR_DEPARTMENT_OTHER)
Admission: EM | Admit: 2024-07-08 | Discharge: 2024-07-08 | Disposition: A | Discharge status: Home / Self Care | Source: Ambulatory Visit | Attending: Emergency Medicine | Admitting: Emergency Medicine

## 2024-07-08 ENCOUNTER — Encounter (HOSPITAL_BASED_OUTPATIENT_CLINIC_OR_DEPARTMENT_OTHER): Payer: Self-pay | Admitting: Emergency Medicine

## 2024-07-08 DIAGNOSIS — R1032 Left lower quadrant pain: Secondary | ICD-10-CM

## 2024-07-08 DIAGNOSIS — I10 Essential (primary) hypertension: Secondary | ICD-10-CM | POA: Insufficient documentation

## 2024-07-08 DIAGNOSIS — D72829 Elevated white blood cell count, unspecified: Secondary | ICD-10-CM | POA: Insufficient documentation

## 2024-07-08 DIAGNOSIS — Z79899 Other long term (current) drug therapy: Secondary | ICD-10-CM | POA: Insufficient documentation

## 2024-07-08 DIAGNOSIS — Z7901 Long term (current) use of anticoagulants: Secondary | ICD-10-CM | POA: Insufficient documentation

## 2024-07-08 DIAGNOSIS — Z853 Personal history of malignant neoplasm of breast: Secondary | ICD-10-CM | POA: Insufficient documentation

## 2024-07-08 DIAGNOSIS — Z85038 Personal history of other malignant neoplasm of large intestine: Secondary | ICD-10-CM | POA: Insufficient documentation

## 2024-07-08 DIAGNOSIS — N3000 Acute cystitis without hematuria: Secondary | ICD-10-CM | POA: Insufficient documentation

## 2024-07-08 LAB — CBC WITH DIFFERENTIAL/PLATELET
Abs Immature Granulocytes: 0.03 K/uL (ref 0.00–0.07)
Basophils Absolute: 0.1 K/uL (ref 0.0–0.1)
Basophils Relative: 1 %
Eosinophils Absolute: 0.2 K/uL (ref 0.0–0.5)
Eosinophils Relative: 2 %
HCT: 42.1 % (ref 36.0–46.0)
Hemoglobin: 14.1 g/dL (ref 12.0–15.0)
Immature Granulocytes: 0 %
Lymphocytes Relative: 34 %
Lymphs Abs: 4.3 K/uL — ABNORMAL HIGH (ref 0.7–4.0)
MCH: 29.7 pg (ref 26.0–34.0)
MCHC: 33.5 g/dL (ref 30.0–36.0)
MCV: 88.6 fL (ref 80.0–100.0)
Monocytes Absolute: 0.7 K/uL (ref 0.1–1.0)
Monocytes Relative: 6 %
Neutro Abs: 7.3 K/uL (ref 1.7–7.7)
Neutrophils Relative %: 57 %
Platelets: 218 K/uL (ref 150–400)
RBC: 4.75 MIL/uL (ref 3.87–5.11)
RDW: 12.8 % (ref 11.5–15.5)
WBC: 12.6 K/uL — ABNORMAL HIGH (ref 4.0–10.5)
nRBC: 0 % (ref 0.0–0.2)

## 2024-07-08 LAB — COMPREHENSIVE METABOLIC PANEL WITH GFR
ALT: 10 U/L (ref 0–44)
AST: 20 U/L (ref 15–41)
Albumin: 4.5 g/dL (ref 3.5–5.0)
Alkaline Phosphatase: 50 U/L (ref 38–126)
Anion gap: 12 (ref 5–15)
BUN: 19 mg/dL (ref 8–23)
CO2: 29 mmol/L (ref 22–32)
Calcium: 10.7 mg/dL — ABNORMAL HIGH (ref 8.9–10.3)
Chloride: 99 mmol/L (ref 98–111)
Creatinine, Ser: 1.08 mg/dL — ABNORMAL HIGH (ref 0.44–1.00)
GFR, Estimated: 49 mL/min — ABNORMAL LOW (ref >60)
Glucose, Bld: 114 mg/dL — ABNORMAL HIGH (ref 70–99)
Potassium: 3.4 mmol/L — ABNORMAL LOW (ref 3.5–5.1)
Sodium: 140 mmol/L (ref 135–145)
Total Bilirubin: 0.8 mg/dL (ref 0.0–1.2)
Total Protein: 7.4 g/dL (ref 6.5–8.1)

## 2024-07-08 LAB — URINALYSIS, ROUTINE W REFLEX MICROSCOPIC
Bilirubin Urine: NEGATIVE
Glucose, UA: NEGATIVE mg/dL
Ketones, ur: NEGATIVE mg/dL
Nitrite: NEGATIVE
Protein, ur: 30 mg/dL — AB
Specific Gravity, Urine: 1.012 (ref 1.005–1.030)
WBC, UA: >50 WBC/hpf (ref 0–5)
pH: 5.5 (ref 5.0–8.0)

## 2024-07-08 LAB — LIPASE, BLOOD: Lipase: 33 U/L (ref 11–51)

## 2024-07-08 MED ORDER — ONDANSETRON 4 MG PO TBDP: 4.0000 mg | ORAL_TABLET | Freq: Three times a day (TID) | ORAL | 0 refills | Status: AC | PRN

## 2024-07-08 MED ORDER — SODIUM CHLORIDE 0.9 % IV SOLN
1.0000 g | Freq: Once | INTRAVENOUS | Status: AC
  Administered 2024-07-08: 1 g via INTRAVENOUS
  Filled 2024-07-08: qty 10

## 2024-07-08 MED ORDER — CIPROFLOXACIN HCL 500 MG PO TABS: 500.0000 mg | ORAL_TABLET | Freq: Two times a day (BID) | ORAL | 0 refills | Status: AC

## 2024-07-08 MED ORDER — SODIUM CHLORIDE 0.9 % IV BOLUS
500.0000 mL | Freq: Once | INTRAVENOUS | Status: AC
  Administered 2024-07-08: 500 mL via INTRAVENOUS

## 2024-07-08 MED ORDER — ONDANSETRON HCL 4 MG/2ML IJ SOLN
4.0000 mg | Freq: Once | INTRAMUSCULAR | Status: AC
  Administered 2024-07-08: 4 mg via INTRAVENOUS
  Filled 2024-07-08: qty 2

## 2024-07-08 MED ORDER — METRONIDAZOLE 500 MG PO TABS: 500.0000 mg | ORAL_TABLET | Freq: Two times a day (BID) | ORAL | 0 refills | Status: AC

## 2024-07-08 MED ORDER — IOHEXOL 300 MG/ML  SOLN
100.0000 mL | Freq: Once | INTRAMUSCULAR | Status: AC | PRN
  Administered 2024-07-08: 100 mL via INTRAVENOUS

## 2024-07-08 NOTE — ED Provider Notes (Signed)
 [place] EMERGENCY DEPARTMENT AT [facility] Provider Note   CSN: [id number] Arrival date & time: 07/08/24  1633     Patient presents with: Abdominal Pain   Mallory Stephens is a 89 y.o. female with a past medical history significant for hyperlipidemia, history of PUD, pulmonary hypertension, hypertension, history of breast and colon cancer who presents to the ED due to left lower quadrant abdominal pain that has been intermittent for the past 6 weeks.  Pain worse when bending over or movement. Patient admits to 4-5 episodes of nonbloody, nonbilious emesis over the past 6 weeks.  Also admits to intermittent diarrhea that is nonbloody.  Previous cholecystectomy and partial colectomy.  Denies any melena, hematemesis, or hematochezia. Denies fever and chills. No chest pain or shortness of breath. Last BM earlier today which was normal; however she then had an episode of loose stool afterwards. Denies urinary symptoms.   History obtained from patient and past medical records. No interpreter used during encounter.       Prior to Admission medications   Medication Sig Start Date End Date Taking? Authorizing Provider  ciprofloxacin  (CIPRO ) 500 MG tablet Take 1 tablet (500 mg total) by mouth every 12 (twelve) hours. 07/08/24  Yes Dala Breault C, PA-C  metroNIDAZOLE  (FLAGYL ) 500 MG tablet Take 1 tablet (500 mg total) by mouth 2 (two) times daily. 07/08/24  Yes Averey Trompeter C, PA-C  ondansetron  (ZOFRAN -ODT) 4 MG disintegrating tablet Take 1 tablet (4 mg total) by mouth every 8 (eight) hours as needed for nausea or vomiting. 07/08/24  Yes Boris Engelmann, Aleck BROCKS, PA-C  acetaminophen  (TYLENOL ) 325 MG tablet Take 325 mg by mouth every 6 (six) hours as needed for moderate pain.    [provider]  alum & mag hydroxide-simeth (MAALOX/MYLANTA) 200-200-20 MG/5ML suspension Take 30 mLs by mouth every 6 (six) hours as needed for indigestion (gas/bloating). 09/26/23   Will Almarie MATSU,  MD  anastrozole  (ARIMIDEX ) 1 MG tablet TAKE 1 TABLET DAILY 05/16/24   Odean Potts, MD  apixaban  (ELIQUIS ) 2.5 MG TABS tablet Take 1 tablet (2.5 mg total) by mouth 2 (two) times daily. 03/24/24   Shlomo Wilbert SAUNDERS, MD  cholecalciferol  (VITAMIN D3) 25 MCG (1000 UNIT) tablet Take 1 tablet (1,000 Units total) by mouth daily. 11/01/20   Magrinat, Sandria BROCKS, MD  famotidine  (PEPCID ) 40 MG tablet Take 40 mg by mouth at bedtime. 12/29/18   [provider]  furosemide  (LASIX ) 20 MG tablet Take 20 mg by mouth in the morning.    [provider]  ibandronate  (BONIVA ) 150 MG tablet TAKE 1 TABLET BY MOUTH EVERY 30 DAYS ON THE 10TH DAY OF EACH MONTH IN THE MORNING WITH A FULL GLASS OF WATER,ON AN EMPTY STOMACH AND DO NOT TAKE ANYTHING ELSE BY MOUTH OR LIE DOWN FOR THE NEXT 30 MIN 07/27/23   Odean Potts, MD  losartan  (COZAAR ) 25 MG tablet Take 25 mg by mouth daily. Patient not taking: Reported on 06/21/2024 09/25/23   [provider]  metoprolol  tartrate (LOPRESSOR ) 25 MG tablet Take 1 tablet (25 mg total) by mouth 2 (two) times daily. 01/11/24   Shlomo Wilbert SAUNDERS, MD  ondansetron  (ZOFRAN ) 4 MG tablet Take 1 tablet (4 mg total) by mouth every 6 (six) hours as needed for nausea. 09/26/23   Will Almarie MATSU, MD  PARoxetine  (PAXIL ) 10 MG tablet Take 10 mg by mouth every morning.    [provider]  polycarbophil (FIBERCON) 625 MG tablet Take 1 tablet (625 mg  total) by mouth 2 (two) times daily. 09/26/23   Will Almarie MATSU, MD  polyvinyl alcohol  (LIQUIFILM TEARS) 1.4 % ophthalmic solution Place 1-2 drops into both eyes every 6 (six) hours as needed for dry eyes.    [provider]  pravastatin  (PRAVACHOL ) 20 MG tablet Take 20 mg by mouth at bedtime.    [provider]  prochlorperazine  (COMPAZINE ) 10 MG tablet Take 1 tablet (10 mg total) by mouth every 6 (six) hours as needed for refractory nausea / vomiting (Use for nausea and / or vomiting unresolved with ondansetron   (Zofran )). 09/26/23   Will Almarie MATSU, MD  solifenacin (VESICARE) 5 MG tablet Take 5 mg by mouth in the morning.    [provider]    Allergies: Sulfa antibiotics and Sulfa drugs cross reactors    Review of Systems  Constitutional:  Negative for fever.  Respiratory:  Negative for shortness of breath.   Cardiovascular:  Negative for chest pain.  Gastrointestinal:  Positive for abdominal pain, diarrhea, nausea and vomiting.  Genitourinary:  Negative for dysuria.    Updated Vital Signs BP (!) 164/62   Pulse 64   Temp 98.2 F (36.8 C) (Oral)   Resp 18   SpO2 93%   Physical Exam Vitals and nursing note reviewed.  Constitutional:      General: She is not in acute distress.    Appearance: She is not ill-appearing.  HENT:     Head: Normocephalic.  Eyes:     Pupils: Pupils are equal, round, and reactive to light.  Cardiovascular:     Rate and Rhythm: Normal rate and regular rhythm.     Pulses: Normal pulses.     Heart sounds: Normal heart sounds. No murmur heard.    No friction rub. No gallop.  Pulmonary:     Effort: Pulmonary effort is normal.     Breath sounds: Normal breath sounds.  Abdominal:     General: Abdomen is flat. There is no distension.     Palpations: Abdomen is soft.     Tenderness: There is abdominal tenderness. There is no guarding or rebound.     Comments: LLQ tenderness  Musculoskeletal:        General: Normal range of motion.     Cervical back: Neck supple.  Skin:    General: Skin is warm and dry.  Neurological:     General: No focal deficit present.     Mental Status: She is alert.  Psychiatric:        Mood and Affect: Mood normal.        Behavior: Behavior normal.     (all labs ordered are listed, but only abnormal results are displayed) Labs Reviewed  COMPREHENSIVE METABOLIC PANEL WITH GFR - Abnormal; Notable for the following components:      Result Value   Potassium 3.4 (*)    Glucose, Bld 114 (*)    Creatinine, Ser 1.08  (*)    Calcium  10.7 (*)    GFR, Estimated 49 (*)    All other components within normal limits  CBC WITH DIFFERENTIAL/PLATELET - Abnormal; Notable for the following components:   WBC 12.6 (*)    Lymphs Abs 4.3 (*)    All other components within normal limits  URINALYSIS, ROUTINE W REFLEX MICROSCOPIC - Abnormal; Notable for the following components:   Hgb urine dipstick MODERATE (*)    Protein, ur 30 (*)    Leukocytes,Ua SMALL (*)    Bacteria, UA RARE (*)  All other components within normal limits  URINE CULTURE  LIPASE, BLOOD    EKG: None  Radiology: CT ABDOMEN PELVIS W CONTRAST Result Date: 07/08/2024 EXAM: CT ABDOMEN AND PELVIS WITH CONTRAST 07/08/2024 05:57:49 PM TECHNIQUE: CT of the abdomen and pelvis was performed with the administration of 100 mL of iohexol  (OMNIPAQUE ) 300 MG/ML solution. Multiplanar reformatted images are provided for review. Automated exposure control, iterative reconstruction, and/or weight-based adjustment of the mA/kV was utilized to reduce the radiation dose to as low as reasonably achievable. COMPARISON: Comparison to 19:25. CLINICAL HISTORY: LLQ abdominal pain. FINDINGS: LOWER CHEST: Classified granuloma in the right lower lobe, stable. Mild cardiomegaly. LIVER: The liver is unremarkable. GALLBLADDER AND BILE DUCTS: Prior cholecystectomy. Mild intrahepatic and extrahepatic biliary ductal dilatation related to the patient's age and post-cholecystectomy state, stable. SPLEEN: No acute abnormality. PANCREAS: No acute abnormality. ADRENAL GLANDS: No acute abnormality. KIDNEYS, URETERS AND BLADDER: No stones in the kidneys or ureters. No hydronephrosis. No perinephric or periureteral stranding. Urinary bladder is unremarkable. GI AND BOWEL: Stomach demonstrates no acute abnormality. Prior right hemicolectomy. Extensive left colonic diverticulosis. No active diverticulitis. There is no bowel obstruction. PERITONEUM AND RETROPERITONEUM: No ascites. No free air.  VASCULATURE: Aorta is normal in caliber. Diffuse aortoiliac atherosclerosis. LYMPH NODES: No lymphadenopathy. REPRODUCTIVE ORGANS: No acute abnormality. BONES AND SOFT TISSUES: Moderate compression fractures noted at T11 and L1, stable since prior study. No focal soft tissue abnormality. IMPRESSION: 1. No acute findings. 2. Extensive left colonic diverticulosis without evidence of diverticulitis. Electronically signed by: Franky Crease MD 07/08/2024 06:16 PM EST RP Workstation: [id number]     Procedures   Medications Ordered in the ED  sodium chloride  0.9 % bolus 500 mL (0 mLs Intravenous Stopped 07/08/24 1821)  ondansetron  (ZOFRAN ) injection 4 mg (4 mg Intravenous Given 07/08/24 1733)  iohexol  (OMNIPAQUE ) 300 MG/ML solution 100 mL (100 mLs Intravenous Contrast Given 07/08/24 1751)  cefTRIAXone  (ROCEPHIN ) 1 g in sodium chloride  0.9 % 100 mL IVPB (0 g Intravenous Stopped 07/08/24 1938)    Clinical Course as of 07/08/24 1944  Fri Jul 08, 2024  1748 WBC(!): 12.6 [CA]  [address]): SMALL [CA]  1756 WBC, UA: >50 [CA]  1756 Bacteria, UA(!): RARE [CA]    Clinical Course User Index [CA] Lorelle Aleck BROCKS, PA-C                                 Medical Decision Making Amount and/or Complexity of Data Reviewed Independent Historian: caregiver    Details: Niece at bedside provided some history External Data Reviewed: notes. Labs: ordered. Decision-making details documented in ED Course. Radiology: ordered and independent interpretation performed. Decision-making details documented in ED Course.  Risk Prescription drug management.   This patient presents to the ED for concern of LLQ abdominal pain, this involves an extensive number of treatment options, and is a complaint that carries with it a high risk of complications and morbidity.  The differential diagnosis includes reticulitis, malignancy, bowel obstruction, etc  89 year old female presents to the ED due to left lower quadrant  abdominal pain that has been intermittent for the past 6 weeks associated with 4-5 episodes of nonbloody, nonbilious emesis and nonbloody diarrhea.  History of breast and colon cancer.  Previous cholecystectomy and partial colectomy.  Upon arrival, stable vitals.  Patient well-appearing on exam.  Abdomen soft, nondistended with left lower quadrant tenderness. No rebound or guarding. Routine labs ordered.  CT abdomen ordered.  IV fluids and Zofran  given.  Patient declined pain medication at this time.  CBC with slight leukocytosis at 12.6.  Normal hemoglobin.  CMP with mild hypokalemia at 3.4.  Lipase normal.  Low suspicion for pancreatitis.  UA with greater than 50 white blood cells, rare bacteria, and small leukocytes.  Patient denies any urinary symptoms.  UA does not appear contaminated.  Will give dose of Rocephin  and cover for possible UTI pending urine culture.  CT abdomen personally reviewed and interpreted which negative for any acute abnormalities.  Does demonstrate extensive left colonic diverticulosis without evidence of diverticulitis.  After sharing CT results with niece and patient at bedside niece notes that patient had similar symptoms a few years ago and was diagnosed with diverticulitis.  Had a long discussion with both patient and niece and niece would prefer to have patient treated for early diverticulitis with ciprofloxacin  and Flagyl  which will also cover for possible UTI.  No fever or chills.  Low suspicion for sepsis.  Able to tolerate p.o. without difficulty.  Advised patient to follow-up with PCP early next week or return to the ER for worsening symptoms.  Patient stable for discharge. Strict ED precautions discussed with patient. Patient states understanding and agrees to plan. Patient discharged home in no acute distress and stable vitals  Co morbidities that complicate the patient evaluation  A. Fib  Social Determinants of Health:  Elderly >65  Test / Admission -  Considered:  Considered admission however, pain well-controlled here in the ED.  Low suspicion for sepsis.  Feel patient is stable for outpatient therapy with antibiotics to cover for possible early diverticulitis and UTI.  Niece at bedside agreeable to plan.  Discussed with Dr. Zavitz who evaluated patient at bedside and agrees with assessment and plan.      Final diagnoses:  Acute cystitis without hematuria  Left lower quadrant pain    ED Discharge Orders          Ordered    ciprofloxacin  (CIPRO ) 500 MG tablet  Every 12 hours        07/08/24 1939    metroNIDAZOLE  (FLAGYL ) 500 MG tablet  2 times daily        07/08/24 1939    ondansetron  (ZOFRAN -ODT) 4 MG disintegrating tablet  Every 8 hours PRN        07/08/24 1940               Sopheap Boehle C, PA-C 07/08/24 1946    Tonia Chew, MD 07/09/24 [phone]

## 2024-07-08 NOTE — Discharge Instructions (Addendum)
 It was a pleasure taking care of you today.  As discussed, your workup is reassuring.  Your urine showed a possible UTI.  CT abdomen was unremarkable however showed diverticulosis without evidence of diverticulitis.  After a long discussion it was agreed upon that you will be treated for early diverticulitis which will also cover for a possible UTI.  Please follow-up with PCP early next week for recheck.  Return to the ER for any worsening symptoms.

## 2024-07-08 NOTE — ED Notes (Signed)
 Spoke with lab to add on urine culture.

## 2024-07-08 NOTE — ED Triage Notes (Signed)
 Left side abdo pain Nausea, vomiting diarrhea, no appetitie On going x 6 weeks Getting worse, worse when bending over Hx of colon CA  Has had constipation and took meds for it this week as well

## 2024-07-10 LAB — URINE CULTURE: Culture: >=100000 — AB

## 2024-07-11 ENCOUNTER — Telehealth (HOSPITAL_BASED_OUTPATIENT_CLINIC_OR_DEPARTMENT_OTHER): Payer: Self-pay

## 2024-07-11 NOTE — Telephone Encounter (Signed)
 Post ED Visit - Positive Culture Follow-up  Culture report reviewed by antimicrobial stewardship pharmacist: [name] [x]  Leonor Bash, [place].D. []  Venetia Gully, Pharm.D., BCPS AQ-ID []  Garrel Crews, Pharm.D., BCPS []  Almarie Lunger, Pharm.D., BCPS []  [place], [address].D., BCPS, AAHIVP []  Rosaline Bihari, Pharm.D., BCPS, AAHIVP []  Vernell Meier, PharmD, BCPS []  Latanya Hint, PharmD, BCPS []  Donald Medley, PharmD, BCPS []  Rocky Bold, PharmD []  Dorothyann Alert, PharmD, BCPS []  Morene Babe, PharmD  [facility] []  Rosaline Edison, PharmD []  Romona Bliss, PharmD []  Dolphus Roller, PharmD []  Veva Seip, Rph []  Vernell Daunt) Leonce, PharmD []  Eva Allis, PharmD []  Rosaline Millet, PharmD []  Iantha Batch, PharmD []  Arvin Gauss, PharmD []  Wanda Hasting, PharmD []  Ronal Rav, PharmD []  Rocky Slade, PharmD []  Bard Jeans, PharmD   Positive urine culture Treated with Ciprofloxacin  and Metronidazole , organism sensitive to the same and no further patient follow-up is required at this time.  Mallory Stephens 07/11/2024, 8:23 AM

## 2024-08-10 ENCOUNTER — Ambulatory Visit (HOSPITAL_COMMUNITY)
Admission: RE | Admit: 2024-08-10 | Discharge: 2024-08-10 | Disposition: A | Discharge status: Home / Self Care | Source: Ambulatory Visit | Attending: Internal Medicine | Admitting: Internal Medicine

## 2024-08-10 DIAGNOSIS — I059 Rheumatic mitral valve disease, unspecified: Secondary | ICD-10-CM | POA: Diagnosis present

## 2024-08-10 LAB — ECHOCARDIOGRAM COMPLETE
Area-P 1/2: 3.95 cm2
MV VTI: 1.91 cm2
P 1/2 time: 333 ms
S' Lateral: 2.4 cm

## 2024-08-12 ENCOUNTER — Encounter: Payer: Self-pay | Admitting: Cardiology

## 2024-08-12 ENCOUNTER — Ambulatory Visit: Payer: Self-pay | Admitting: Cardiology

## 2024-08-12 DIAGNOSIS — I351 Nonrheumatic aortic (valve) insufficiency: Secondary | ICD-10-CM | POA: Insufficient documentation

## 2024-08-12 DIAGNOSIS — Z79899 Other long term (current) drug therapy: Secondary | ICD-10-CM

## 2024-08-12 DIAGNOSIS — I272 Pulmonary hypertension, unspecified: Secondary | ICD-10-CM

## 2024-08-12 NOTE — Telephone Encounter (Signed)
 Call to patient, no answer, left VM per DPR asking patient to call back to discuss echo results.

## 2024-08-12 NOTE — Telephone Encounter (Signed)
-----   Message from Wilbert Bihari, MD sent at 08/12/2024 11:02 AM EST ----- Echo showed normal pumping function of the heart EF 65 to 70% with mildly thickened heart muscle moderate pulmonary hypertension, severe enlargement of the atria which are the upper chambers of the  heart, mild leakiness of the mitral valve and mild to moderate leakiness of the aortic valve  VQ scan to rule out chronic thromboembolic disease as an etiology of pulmonary hypertension.  Also get PFTs with DLCO and a home sleep study.  I would like her to have a sedimentation rate as well as  high-sensitivity CRP/ANA/RF.  Please refer her to Dr. Rolan for pulmonary hypertension workup

## 2024-08-15 NOTE — Telephone Encounter (Signed)
 Call to patient's niece Leita [facility]) to advise Echo showed normal pumping function of the heart EF 65 to 70% with mildly thickened heart muscle moderate pulmonary hypertension, severe enlargement of the atria which are the upper chambers of the heart, mild leakiness of the mitral valve and mild to moderate leakiness of the aortic valve. Leita verbalizes understanding and agrees to plan.   Orders palced for VQ scan to rule out chronic thromboembolic disease as an etiology of pulmonary hypertension.  Also get PFTs with DLCO and a home sleep study.  I would like her to have a sedimentation rate as well as high-sensitivity CRP/ANA/RF.  Please refer her to Dr. Rolan for pulmonary hypertension workup

## 2024-08-15 NOTE — Telephone Encounter (Signed)
-----   Message from Wilbert Bihari, MD sent at 08/12/2024 11:02 AM EST ----- Echo showed normal pumping function of the heart EF 65 to 70% with mildly thickened heart muscle moderate pulmonary hypertension, severe enlargement of the atria which are the upper chambers of the  heart, mild leakiness of the mitral valve and mild to moderate leakiness of the aortic valve  VQ scan to rule out chronic thromboembolic disease as an etiology of pulmonary hypertension.  Also get PFTs with DLCO and a home sleep study.  I would like her to have a sedimentation rate as well as  high-sensitivity CRP/ANA/RF.  Please refer her to Dr. Rolan for pulmonary hypertension workup

## 2024-08-15 NOTE — Addendum Note (Signed)
 Addended by: JANIT GENI CROME on: 08/15/2024 01:32 PM   Modules accepted: Orders

## 2024-08-16 ENCOUNTER — Ambulatory Visit
Admission: RE | Admit: 2024-08-16 | Discharge: 2024-08-16 | Disposition: A | Source: Ambulatory Visit | Attending: Hematology and Oncology

## 2024-08-16 DIAGNOSIS — C50212 Malignant neoplasm of upper-inner quadrant of left female breast: Secondary | ICD-10-CM

## 2024-08-17 ENCOUNTER — Ambulatory Visit

## 2024-08-24 ENCOUNTER — Other Ambulatory Visit (HOSPITAL_COMMUNITY)

## 2024-08-24 ENCOUNTER — Ambulatory Visit (HOSPITAL_COMMUNITY)

## 2024-08-26 ENCOUNTER — Ambulatory Visit (HOSPITAL_COMMUNITY)
Admission: RE | Admit: 2024-08-26 | Discharge: 2024-08-26 | Disposition: A | Source: Ambulatory Visit | Attending: Cardiology | Admitting: Cardiology

## 2024-08-26 DIAGNOSIS — I272 Pulmonary hypertension, unspecified: Secondary | ICD-10-CM | POA: Insufficient documentation

## 2024-08-26 LAB — ANA: Anti Nuclear Antibody (ANA): NEGATIVE

## 2024-08-26 LAB — RHEUMATOID FACTOR: Rheumatoid fact SerPl-aCnc: 12.2 [IU]/mL

## 2024-08-26 LAB — HIGH SENSITIVITY CRP: CRP, High Sensitivity: 1.05 mg/L (ref 0.00–3.00)

## 2024-08-26 LAB — SEDIMENTATION RATE: Sed Rate: 4 mm/h (ref 0–40)

## 2024-08-26 MED ORDER — TECHNETIUM TO 99M ALBUMIN AGGREGATED
3.8000 | Freq: Once | INTRAVENOUS | Status: AC
  Administered 2024-08-26: 3.8 via INTRAVENOUS

## 2024-09-01 NOTE — Telephone Encounter (Signed)
 Call to patient to discuss CXR and VQ results, no answer.  Left VM asking recipient to call [place] at our office #. [facility] also sent.

## 2024-09-01 NOTE — Telephone Encounter (Signed)
 Patient's Niece returned call

## 2024-09-01 NOTE — Telephone Encounter (Signed)
-----   Message from Wilbert Bihari, MD sent at 08/29/2024  5:51 PM EST ----- No evidence of pulmonary embolism
